# Patient Record
Sex: Female | Born: 1945 | Race: White | Hispanic: No | Marital: Married | Smoking: Never smoker
Health system: Southern US, Community
[De-identification: ages and names within clinical notes are randomized; demographics above are authoritative.]

## PROBLEM LIST (undated history)

## (undated) DIAGNOSIS — D126 Benign neoplasm of colon, unspecified: Secondary | ICD-10-CM

## (undated) DIAGNOSIS — C801 Malignant (primary) neoplasm, unspecified: Secondary | ICD-10-CM

## (undated) DIAGNOSIS — M722 Plantar fascial fibromatosis: Secondary | ICD-10-CM

## (undated) DIAGNOSIS — M8580 Other specified disorders of bone density and structure, unspecified site: Secondary | ICD-10-CM

## (undated) DIAGNOSIS — I7300 Raynaud's syndrome without gangrene: Secondary | ICD-10-CM

## (undated) DIAGNOSIS — S32010A Wedge compression fracture of first lumbar vertebra, initial encounter for closed fracture: Secondary | ICD-10-CM

## (undated) DIAGNOSIS — I7090 Unspecified atherosclerosis: Secondary | ICD-10-CM

## (undated) DIAGNOSIS — M3500 Sicca syndrome, unspecified: Secondary | ICD-10-CM

## (undated) DIAGNOSIS — K635 Polyp of colon: Secondary | ICD-10-CM

## (undated) DIAGNOSIS — M1990 Unspecified osteoarthritis, unspecified site: Secondary | ICD-10-CM

## (undated) DIAGNOSIS — M329 Systemic lupus erythematosus, unspecified: Secondary | ICD-10-CM

## (undated) DIAGNOSIS — F419 Anxiety disorder, unspecified: Secondary | ICD-10-CM

## (undated) DIAGNOSIS — T7840XA Allergy, unspecified, initial encounter: Secondary | ICD-10-CM

## (undated) DIAGNOSIS — E039 Hypothyroidism, unspecified: Secondary | ICD-10-CM

## (undated) DIAGNOSIS — Z9109 Other allergy status, other than to drugs and biological substances: Secondary | ICD-10-CM

## (undated) HISTORY — DX: Raynaud's syndrome without gangrene: I73.00

## (undated) HISTORY — DX: Plantar fascial fibromatosis: M72.2

## (undated) HISTORY — PX: CATARACT EXTRACTION: SUR2

## (undated) HISTORY — PX: ENDOMETRIAL ABLATION: SHX621

## (undated) HISTORY — DX: Sjogren syndrome, unspecified: M35.00

## (undated) HISTORY — PX: POLYPECTOMY: SHX149

## (undated) HISTORY — DX: Polyp of colon: K63.5

## (undated) HISTORY — PX: APPENDECTOMY: SHX54

## (undated) HISTORY — DX: Other specified disorders of bone density and structure, unspecified site: M85.80

## (undated) HISTORY — PX: WISDOM TOOTH EXTRACTION: SHX21

## (undated) HISTORY — DX: Hypothyroidism, unspecified: E03.9

## (undated) HISTORY — DX: Unspecified osteoarthritis, unspecified site: M19.90

## (undated) HISTORY — DX: Systemic lupus erythematosus, unspecified: M32.9

## (undated) HISTORY — DX: Unspecified atherosclerosis: I70.90

## (undated) HISTORY — DX: Other allergy status, other than to drugs and biological substances: Z91.09

## (undated) HISTORY — DX: Benign neoplasm of colon, unspecified: D12.6

## (undated) HISTORY — DX: Wedge compression fracture of first lumbar vertebra, initial encounter for closed fracture: S32.010A

---

## 1998-02-09 ENCOUNTER — Other Ambulatory Visit: Admission: RE | Admit: 1998-02-09 | Discharge: 1998-02-09 | Payer: Self-pay | Admitting: Obstetrics and Gynecology

## 1998-03-15 ENCOUNTER — Ambulatory Visit (HOSPITAL_COMMUNITY): Admission: RE | Admit: 1998-03-15 | Discharge: 1998-03-15 | Payer: Self-pay | Admitting: Obstetrics and Gynecology

## 1998-04-08 ENCOUNTER — Ambulatory Visit (HOSPITAL_COMMUNITY): Admission: RE | Admit: 1998-04-08 | Discharge: 1998-04-08 | Payer: Self-pay | Admitting: *Deleted

## 1998-10-11 ENCOUNTER — Ambulatory Visit (HOSPITAL_COMMUNITY): Admission: RE | Admit: 1998-10-11 | Discharge: 1998-10-11 | Payer: Self-pay | Admitting: Obstetrics and Gynecology

## 1999-03-13 ENCOUNTER — Other Ambulatory Visit: Admission: RE | Admit: 1999-03-13 | Discharge: 1999-03-13 | Payer: Self-pay | Admitting: Obstetrics and Gynecology

## 1999-04-04 ENCOUNTER — Ambulatory Visit (HOSPITAL_COMMUNITY): Admission: RE | Admit: 1999-04-04 | Discharge: 1999-04-04 | Payer: Self-pay | Admitting: Obstetrics and Gynecology

## 1999-04-04 ENCOUNTER — Encounter: Payer: Self-pay | Admitting: Obstetrics and Gynecology

## 2000-03-21 ENCOUNTER — Other Ambulatory Visit: Admission: RE | Admit: 2000-03-21 | Discharge: 2000-03-21 | Payer: Self-pay | Admitting: Obstetrics and Gynecology

## 2000-04-04 ENCOUNTER — Encounter: Admission: RE | Admit: 2000-04-04 | Discharge: 2000-04-04 | Payer: Self-pay | Admitting: Obstetrics and Gynecology

## 2000-04-04 ENCOUNTER — Encounter: Payer: Self-pay | Admitting: Obstetrics and Gynecology

## 2001-03-11 ENCOUNTER — Other Ambulatory Visit: Admission: RE | Admit: 2001-03-11 | Discharge: 2001-03-11 | Payer: Self-pay | Admitting: Obstetrics and Gynecology

## 2001-03-21 ENCOUNTER — Other Ambulatory Visit: Admission: RE | Admit: 2001-03-21 | Discharge: 2001-03-21 | Payer: Self-pay | Admitting: Obstetrics and Gynecology

## 2001-03-26 ENCOUNTER — Encounter: Admission: RE | Admit: 2001-03-26 | Discharge: 2001-03-26 | Payer: Self-pay | Admitting: Obstetrics and Gynecology

## 2001-03-26 ENCOUNTER — Encounter: Payer: Self-pay | Admitting: Obstetrics and Gynecology

## 2001-04-08 ENCOUNTER — Encounter: Admission: RE | Admit: 2001-04-08 | Discharge: 2001-04-08 | Payer: Self-pay | Admitting: Obstetrics and Gynecology

## 2001-04-08 ENCOUNTER — Encounter: Payer: Self-pay | Admitting: Obstetrics and Gynecology

## 2001-05-08 ENCOUNTER — Encounter: Payer: Self-pay | Admitting: Obstetrics and Gynecology

## 2001-05-08 ENCOUNTER — Encounter: Admission: RE | Admit: 2001-05-08 | Discharge: 2001-05-08 | Payer: Self-pay | Admitting: Obstetrics and Gynecology

## 2001-09-08 ENCOUNTER — Encounter (INDEPENDENT_AMBULATORY_CARE_PROVIDER_SITE_OTHER): Payer: Self-pay | Admitting: *Deleted

## 2001-09-08 ENCOUNTER — Ambulatory Visit (HOSPITAL_COMMUNITY): Admission: RE | Admit: 2001-09-08 | Discharge: 2001-09-08 | Payer: Self-pay | Admitting: Gastroenterology

## 2002-03-24 ENCOUNTER — Other Ambulatory Visit: Admission: RE | Admit: 2002-03-24 | Discharge: 2002-03-24 | Payer: Self-pay | Admitting: Obstetrics and Gynecology

## 2002-04-09 ENCOUNTER — Encounter: Admission: RE | Admit: 2002-04-09 | Discharge: 2002-04-09 | Payer: Self-pay | Admitting: Obstetrics and Gynecology

## 2002-04-09 ENCOUNTER — Encounter: Payer: Self-pay | Admitting: Obstetrics and Gynecology

## 2002-08-25 ENCOUNTER — Encounter: Admission: RE | Admit: 2002-08-25 | Discharge: 2002-08-25 | Payer: Self-pay

## 2002-09-24 ENCOUNTER — Encounter: Payer: Self-pay | Admitting: Orthopedic Surgery

## 2002-09-24 ENCOUNTER — Ambulatory Visit (HOSPITAL_COMMUNITY): Admission: RE | Admit: 2002-09-24 | Discharge: 2002-09-24 | Payer: Self-pay | Admitting: Orthopedic Surgery

## 2002-10-26 ENCOUNTER — Encounter: Payer: Self-pay | Admitting: Orthopedic Surgery

## 2002-10-26 ENCOUNTER — Encounter: Admission: RE | Admit: 2002-10-26 | Discharge: 2002-10-26 | Payer: Self-pay | Admitting: Orthopedic Surgery

## 2002-11-10 ENCOUNTER — Other Ambulatory Visit: Admission: RE | Admit: 2002-11-10 | Discharge: 2002-11-10 | Payer: Self-pay | Admitting: Obstetrics & Gynecology

## 2003-03-29 ENCOUNTER — Other Ambulatory Visit: Admission: RE | Admit: 2003-03-29 | Discharge: 2003-03-29 | Payer: Self-pay | Admitting: Obstetrics and Gynecology

## 2003-04-01 ENCOUNTER — Encounter: Admission: RE | Admit: 2003-04-01 | Discharge: 2003-04-01 | Payer: Self-pay | Admitting: Obstetrics and Gynecology

## 2003-04-01 ENCOUNTER — Encounter: Payer: Self-pay | Admitting: Obstetrics and Gynecology

## 2003-10-18 ENCOUNTER — Encounter: Admission: RE | Admit: 2003-10-18 | Discharge: 2003-10-18 | Payer: Self-pay | Admitting: Family Medicine

## 2004-04-17 ENCOUNTER — Encounter: Admission: RE | Admit: 2004-04-17 | Discharge: 2004-04-17 | Payer: Self-pay | Admitting: Family Medicine

## 2004-04-17 ENCOUNTER — Encounter: Admission: RE | Admit: 2004-04-17 | Discharge: 2004-04-17 | Payer: Self-pay | Admitting: Obstetrics and Gynecology

## 2004-10-10 ENCOUNTER — Encounter: Admission: RE | Admit: 2004-10-10 | Discharge: 2004-10-10 | Payer: Self-pay | Admitting: Family Medicine

## 2005-04-18 ENCOUNTER — Encounter: Admission: RE | Admit: 2005-04-18 | Discharge: 2005-04-18 | Payer: Self-pay | Admitting: Obstetrics and Gynecology

## 2006-04-22 ENCOUNTER — Encounter: Admission: RE | Admit: 2006-04-22 | Discharge: 2006-04-22 | Payer: Self-pay | Admitting: Obstetrics and Gynecology

## 2006-10-10 ENCOUNTER — Encounter: Admission: RE | Admit: 2006-10-10 | Discharge: 2006-10-10 | Payer: Self-pay | Admitting: Family Medicine

## 2006-10-22 HISTORY — PX: BUNIONECTOMY: SHX129

## 2007-05-01 ENCOUNTER — Encounter: Admission: RE | Admit: 2007-05-01 | Discharge: 2007-05-01 | Payer: Self-pay | Admitting: Obstetrics and Gynecology

## 2008-05-03 ENCOUNTER — Encounter: Admission: RE | Admit: 2008-05-03 | Discharge: 2008-05-03 | Payer: Self-pay | Admitting: Obstetrics and Gynecology

## 2008-07-28 ENCOUNTER — Encounter: Admission: RE | Admit: 2008-07-28 | Discharge: 2008-07-28 | Payer: Self-pay | Admitting: Obstetrics and Gynecology

## 2008-11-08 ENCOUNTER — Encounter: Admission: RE | Admit: 2008-11-08 | Discharge: 2008-11-08 | Payer: Self-pay | Admitting: Family Medicine

## 2009-01-21 ENCOUNTER — Encounter: Admission: RE | Admit: 2009-01-21 | Discharge: 2009-01-21 | Payer: Self-pay | Admitting: Obstetrics and Gynecology

## 2009-05-04 ENCOUNTER — Encounter: Admission: RE | Admit: 2009-05-04 | Discharge: 2009-05-04 | Payer: Self-pay | Admitting: Obstetrics and Gynecology

## 2009-07-12 ENCOUNTER — Ambulatory Visit (HOSPITAL_COMMUNITY): Admission: RE | Admit: 2009-07-12 | Discharge: 2009-07-12 | Payer: Self-pay | Admitting: Family Medicine

## 2009-07-12 ENCOUNTER — Encounter (INDEPENDENT_AMBULATORY_CARE_PROVIDER_SITE_OTHER): Payer: Self-pay | Admitting: *Deleted

## 2009-07-19 ENCOUNTER — Ambulatory Visit: Payer: Self-pay | Admitting: Gastroenterology

## 2009-07-19 DIAGNOSIS — R1011 Right upper quadrant pain: Secondary | ICD-10-CM | POA: Insufficient documentation

## 2009-07-20 ENCOUNTER — Telehealth (INDEPENDENT_AMBULATORY_CARE_PROVIDER_SITE_OTHER): Payer: Self-pay | Admitting: *Deleted

## 2009-07-20 DIAGNOSIS — K828 Other specified diseases of gallbladder: Secondary | ICD-10-CM | POA: Insufficient documentation

## 2010-05-08 ENCOUNTER — Encounter: Admission: RE | Admit: 2010-05-08 | Discharge: 2010-05-08 | Payer: Self-pay | Admitting: Family Medicine

## 2010-11-13 ENCOUNTER — Encounter: Payer: Self-pay | Admitting: Family Medicine

## 2010-12-04 ENCOUNTER — Other Ambulatory Visit: Payer: Self-pay | Admitting: Family Medicine

## 2010-12-04 DIAGNOSIS — Z780 Asymptomatic menopausal state: Secondary | ICD-10-CM

## 2010-12-14 ENCOUNTER — Ambulatory Visit
Admission: RE | Admit: 2010-12-14 | Discharge: 2010-12-14 | Disposition: A | Discharge status: Home / Self Care | Payer: BC Managed Care – PPO | Source: Ambulatory Visit | Attending: Family Medicine | Admitting: Family Medicine

## 2010-12-14 DIAGNOSIS — Z780 Asymptomatic menopausal state: Secondary | ICD-10-CM

## 2011-03-09 NOTE — Procedures (Signed)
[facility]  Patient:    CARROLL, RANNEY Visit Number: [id number] MRN: [id number]          Service Type: Attending:  Verlin Grills, M.D. Dictated by:   Verlin Grills, M.D. Proc. Date: 09/08/01   CC:         Heather Roberts, M.D.   Procedure Report  PROCEDURE PERFORMED:  Screening colonoscopy  REFERRING PHYSICIAN:  Heather Roberts, M.D.  INDICATION FOR PROCEDURE:  Mrs. Jameela Michna (date of birth is 18-Sep-1946) is a 65 year old female who is due for her first screening colonoscopy with polypectomy to prevent colon cancer.  She has an uncle who was diagnosed with colon cancer.  I discussed with Mrs. Choung, the complications associated with colonoscopy and polypectomy including a 15:1000 risk of bleeding and 4:1000 risk of colonic perforation requiring surgical repair.  Mrs. Tamargo has signed the operative permit.  ENDOSCOPIST:  Verlin Grills, M.D.  PREMEDICATION:  Versed 10 mg, Demerol 50 mg.  ENDOSCOPE: Olympus pediatric colonoscope.  DESCRIPTION OF PROCEDURE:  After obtaining informed consent, Mrs. Fodera was placed in the left lateral decubitus position. I administered intravenous Versed and intravenous Demerol to achieve conscious sedation for the procedure.  The patients blood pressure, oxygen saturation and cardiac rhythm were monitored throughout the procedure and documented in the medical record.  Anal inspection was normal.  Digital rectal exam was normal.  The Olympus pediatric video colonoscope was introduced into the rectum and easily advanced to the cecum.  Colonic preparation for the exam today was excellent.  Rectum:  Normal.  Sigmoid colon and descending colon:  Normal.  Splenic flexure:  Normal.  Transverse colon:  Normal.  Hepatic flexure:  Normal.  Ascending colon:  Normal.  Cecum and ileocecal valve:  Normal.  ASSESSMENT:  Normal proctocolonoscopy to the cecum.  No endoscopic  evidence for the present of colorectal neoplasia.  RECOMMENDATIONS:  Repeat screening colonic exam in ten years.Dictated by: Verlin Grills, M.D. Attending:  Verlin Grills, M.D. DD:  09/08/01 TD:  09/08/01 Job: [id number]

## 2011-04-06 ENCOUNTER — Other Ambulatory Visit: Payer: Self-pay | Admitting: Family Medicine

## 2011-04-06 DIAGNOSIS — Z1231 Encounter for screening mammogram for malignant neoplasm of breast: Secondary | ICD-10-CM

## 2011-04-19 ENCOUNTER — Encounter: Payer: Self-pay | Admitting: Gastroenterology

## 2011-05-10 ENCOUNTER — Ambulatory Visit
Admission: RE | Admit: 2011-05-10 | Discharge: 2011-05-10 | Disposition: A | Discharge status: Home / Self Care | Payer: BC Managed Care – PPO | Source: Ambulatory Visit | Attending: Family Medicine | Admitting: Family Medicine

## 2011-05-10 DIAGNOSIS — Z1231 Encounter for screening mammogram for malignant neoplasm of breast: Secondary | ICD-10-CM

## 2011-06-13 ENCOUNTER — Other Ambulatory Visit: Payer: BC Managed Care – PPO | Admitting: Gastroenterology

## 2011-07-27 ENCOUNTER — Encounter: Payer: Self-pay | Admitting: Gastroenterology

## 2011-08-13 ENCOUNTER — Ambulatory Visit (AMBULATORY_SURGERY_CENTER): Payer: Medicare Other

## 2011-08-13 VITALS — Ht 67.0 in | Wt 152.0 lb

## 2011-08-13 DIAGNOSIS — Z1211 Encounter for screening for malignant neoplasm of colon: Secondary | ICD-10-CM

## 2011-08-13 MED ORDER — PEG-KCL-NACL-NASULF-NA ASC-C 100 G PO SOLR: 1.0000 | Freq: Once | ORAL | Status: DC

## 2011-08-27 ENCOUNTER — Telehealth: Payer: Self-pay

## 2011-08-27 ENCOUNTER — Encounter: Payer: Self-pay | Admitting: Gastroenterology

## 2011-08-27 ENCOUNTER — Ambulatory Visit (AMBULATORY_SURGERY_CENTER): Payer: Medicare Other | Admitting: Gastroenterology

## 2011-08-27 VITALS — BP 141/63 | HR 70 | Temp 96.1°F | Resp 33 | Ht 67.0 in | Wt 152.0 lb

## 2011-08-27 DIAGNOSIS — Z1211 Encounter for screening for malignant neoplasm of colon: Secondary | ICD-10-CM

## 2011-08-27 DIAGNOSIS — D126 Benign neoplasm of colon, unspecified: Secondary | ICD-10-CM

## 2011-08-27 DIAGNOSIS — R933 Abnormal findings on diagnostic imaging of other parts of digestive tract: Secondary | ICD-10-CM

## 2011-08-27 HISTORY — PX: COLONOSCOPY W/ POLYPECTOMY: SHX1380

## 2011-08-27 MED ORDER — SODIUM CHLORIDE 0.9 % IV SOLN: 500.0000 mL | INTRAVENOUS | Status: DC

## 2011-08-27 NOTE — Telephone Encounter (Signed)
Records sent to [facility]  CT scheduled   Please have labs done on 08/30/11 at our [place] basement lab.   You have been scheduled for a CT scan of the abdomen and pelvis at [facility] ([address]---this is in the same building as Architectural technologist).   You are scheduled on 08/31/11 at 11 am . You should arrive 15 minutes prior to your appointment time for registration. Please follow the written instructions below on the day of your exam:  WARNING: IF YOU ARE ALLERGIC TO IODINE/X-RAY DYE, PLEASE NOTIFY RADIOLOGY IMMEDIATELY AT [phone]! YOU WILL BE GIVEN A 13 HOUR PREMEDICATION PREP.  1) Do not eat or drink anything after 7 am (4 hours prior to your test) 2) You have been given 2 bottles of oral contrast to drink. The solution may taste  better if refrigerated, but do NOT add ice or any other liquid to this solution. Shake well before drinking.    Drink 1 bottle of contrast @ 9 am (2 hours prior to your exam)  Drink 1 bottle of contrast @ 10 am  (1 hour prior to your exam)  You may take any medications as prescribed with a small amount of water except for the following: Metformin, Glucophage, Glucovance, Avandamet, Riomet, Fortamet, Actoplus Met, Janumet, Glumetza or Metaglip. The above medications must be held the day of the exam AND 48 hours after the exam.  The purpose of you drinking the oral contrast is to aid in the visualization of your intestinal tract. The contrast solution may cause some diarrhea. Before your exam is started, you will be given a small amount of fluid to drink. Depending on your individual set of symptoms, you may also receive an intravenous injection of x-ray contrast/dye. Plan on being at [facility] for 30 minutes or long, depending on the type of exam you are having performed.  If you have any questions regarding your exam or if you need to reschedule, you may call the CT department at [phone] between the hours of 8:00 am and 5:00 pm,  Monday-Friday.  ________________________________________________________________________    Records sent to [facility]  CT scheduled   Please have labs done on 08/30/11 at our [place] basement lab.   You have been scheduled for a CT scan of the abdomen and pelvis at [facility] ([address]---this is in the same building as Architectural technologist).   You are scheduled on 08/31/11 at 11 am . You should arrive 15 minutes prior to your appointment time for registration. Please follow the written instructions below on the day of your exam:  WARNING: IF YOU ARE ALLERGIC TO IODINE/X-RAY DYE, PLEASE NOTIFY RADIOLOGY IMMEDIATELY AT [phone]! YOU WILL BE GIVEN A 13 HOUR PREMEDICATION PREP.  1) Do not eat or drink anything after 7 am (4 hours prior to your test) 2) You have been given 2 bottles of oral contrast to drink. The solution may taste  better if refrigerated, but do NOT add ice or any other liquid to this solution. Shake well before drinking.    Drink 1 bottle of contrast @ 9 am (2 hours prior to your exam)  Drink 1 bottle of contrast @ 10 am  (1 hour prior to your exam)  You may take any medications as prescribed with a small amount of water except for the following: Metformin, Glucophage, Glucovance, Avandamet, Riomet, Fortamet, Actoplus Met, Janumet, Glumetza or Metaglip. The above medications must be held the day of the exam AND  48 hours after the exam.  The purpose of you drinking the oral contrast is to aid in the visualization of your intestinal tract. The contrast solution may cause some diarrhea. Before your exam is started, you will be given a small amount of fluid to drink. Depending on your individual set of symptoms, you may also receive an intravenous injection of x-ray contrast/dye. Plan on being at [facility] for 30 minutes or long, depending on the type of exam you are having performed.  If you have any questions regarding your exam or if you need to  reschedule, you may call the CT department at [phone] between the hours of 8:00 am and 5:00 pm, Monday-Friday.  ________________________________________________________________________  Call pt on 08/28/11 Had procedure today

## 2011-08-27 NOTE — Progress Notes (Signed)
Pt tolerated the colonoscopy very well.  No facial grimacing noted. Maw  Pt waking up as Dr. Christella Hartigan was removing the scope.  I asked the pt if she was comfortable, she replied "yes".  Per Dr. Christella Hartigan no further sedation orders. maw

## 2011-08-27 NOTE — Patient Instructions (Signed)
Green and blue discharge instructions reviewed with patient and care partner.  Impressions/recommendations:  Polyp (handout given)  Dr. Christella Hartigan office to arrange for CT Scan and referral to general surgeon for appendectomy and possible resection of colon.  Resume medications as you were taking them prior to your procedure.

## 2011-08-28 ENCOUNTER — Telehealth: Payer: Self-pay | Admitting: *Deleted

## 2011-08-28 NOTE — Telephone Encounter (Signed)

## 2011-08-28 NOTE — Telephone Encounter (Signed)
Pt aware of the instructions and will have labs and procedure

## 2011-08-29 ENCOUNTER — Other Ambulatory Visit (INDEPENDENT_AMBULATORY_CARE_PROVIDER_SITE_OTHER): Payer: Medicare Other

## 2011-08-29 DIAGNOSIS — R933 Abnormal findings on diagnostic imaging of other parts of digestive tract: Secondary | ICD-10-CM

## 2011-08-30 ENCOUNTER — Other Ambulatory Visit (HOSPITAL_COMMUNITY)
Admission: RE | Admit: 2011-08-30 | Discharge: 2011-08-30 | Disposition: A | Discharge status: Home / Self Care | Payer: Medicare Other | Source: Ambulatory Visit | Attending: Family Medicine | Admitting: Family Medicine

## 2011-08-30 ENCOUNTER — Other Ambulatory Visit: Payer: Self-pay | Admitting: Family Medicine

## 2011-08-30 DIAGNOSIS — Z124 Encounter for screening for malignant neoplasm of cervix: Secondary | ICD-10-CM | POA: Insufficient documentation

## 2011-08-31 ENCOUNTER — Ambulatory Visit (INDEPENDENT_AMBULATORY_CARE_PROVIDER_SITE_OTHER)
Admission: RE | Admit: 2011-08-31 | Discharge: 2011-08-31 | Disposition: A | Discharge status: Home / Self Care | Payer: Medicare Other | Source: Ambulatory Visit | Attending: Gastroenterology | Admitting: Gastroenterology

## 2011-08-31 DIAGNOSIS — R933 Abnormal findings on diagnostic imaging of other parts of digestive tract: Secondary | ICD-10-CM

## 2011-08-31 MED ORDER — IOHEXOL 300 MG/ML  SOLN
100.0000 mL | Freq: Once | INTRAMUSCULAR | Status: AC | PRN
  Administered 2011-08-31: 100 mL via INTRAVENOUS

## 2011-09-10 ENCOUNTER — Ambulatory Visit (INDEPENDENT_AMBULATORY_CARE_PROVIDER_SITE_OTHER): Payer: Medicare Other | Admitting: Surgery

## 2011-09-10 ENCOUNTER — Encounter (INDEPENDENT_AMBULATORY_CARE_PROVIDER_SITE_OTHER): Payer: Self-pay | Admitting: Surgery

## 2011-09-10 DIAGNOSIS — D126 Benign neoplasm of colon, unspecified: Secondary | ICD-10-CM

## 2011-09-10 HISTORY — DX: Benign neoplasm of colon, unspecified: D12.6

## 2011-09-10 NOTE — Progress Notes (Addendum)
Subjective:     Patient ID: [name], female   DOB: 10/12/1946, 65 y.o.   MRN: [id number]  HPI  Patient Care Team: Gweneth Dimitri, MD as PCP - General (Family Medicine) Rob Bunting, MD (Gastroenterology)  This patient is a 65 y.o.female who presents today for surgical evaluation.   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 Need for resection.  Patient is a pleasant woman with known history of colorectal problems in the past. She had a normal colonoscopy 10 years ago. She was given a screening followup. Dr. Christella Hartigan noted a polypoid mass coming out of the appendiceal orifice. Biopsies showed serrated adenoma. He sent to the patient to [place] for resection.  Patient normally has a bowel movement everyday. She can walk on a treadmill for 30 minutes without difficulty. No bleeding. No history of other GI problems. She comes today with her husband.  She saw [place] in 2010 for abdominal pains. She had a HIDA scan that showed a decreased gallbladder ejection fraction. However, when she saw [facility], she was asymptomatic. My partner, Dr. Dwain Sarna, did not feel that she required cholecystectomy at that time. The patient is still asymptomatic. She is eating fine now with no episodes of biliary colic. No bloating, nausea, vomiting, bleeding.  Past Medical History  Diagnosis Date  . Systemic lupus   . Raynauds phenomenon   . Sjogren's syndrome   . Osteoarthritis   . Hypothyroid   . Environmental allergies   . Osteoarthritis   . Osteoarthritis   . Osteopenia   . Colon polyp     Past Surgical History  Procedure Date  . Bunionectomy 2008    right foot  . Endometrial ablation   . Colonoscopy w/ polypectomy Aug 27, 2011    History   Social History  . Marital Status: Married    Spouse Name: N/A    Number of Children: N/A  . Years of Education: N/A   Occupational History  . Not on file.   Social History Main Topics  . Smoking status: Never Smoker   . Smokeless tobacco: Not  on file  . Alcohol Use: Yes  . Drug Use: No  . Sexually Active: Not on file   Other Topics Concern  . Not on file   Social History Narrative  . No narrative on file    Family History  Problem Relation Age of Onset  . Heart disease Mother   . Heart disease Brother   . Diabetes Maternal Grandfather   . Cancer Maternal Uncle     colon    Current outpatient prescriptions:aspirin 81 MG tablet, Take 81 mg by mouth daily.  , Disp: , Rfl: ;  calcium carbonate (OS-CAL) 600 MG TABS, Take 600 mg by mouth 3 (three) times daily.  , Disp: , Rfl: ;  cholecalciferol (VITAMIN D) 1000 UNITS tablet, Take 1,000 Units by mouth 2 (two) times daily.  , Disp: , Rfl: ;  Coenzyme Q10 (CO Q 10 PO), Take 200 mg by mouth 2 times daily at 12 noon and 4 pm.  , Disp: , Rfl:  desonide (DESOWEN) 0.05 % ointment, , Disp: , Rfl: ;  escitalopram (LEXAPRO) 10 MG tablet, , Disp: , Rfl: ;  fexofenadine (ALLEGRA) 180 MG tablet, Take 180 mg by mouth daily.  , Disp: , Rfl: ;  fish oil-omega-3 fatty acids 1000 MG capsule, Take 2 g by mouth daily.  , Disp: , Rfl: ;  folic acid (FOLVITE) 1 MG tablet, , Disp: ,  Rfl:  Glucosamine-Chondroit-Vit C-Mn (GLUCOSAMINE CHONDR 1500 COMPLX PO), Take 1,500 mg by mouth 2 (two) times daily at 10 AM and 5 PM.  , Disp: , Rfl: ;  hydroxychloroquine (PLAQUENIL) 200 MG tablet, , Disp: , Rfl: ;  Magnesium Malate POWD, 1,000 mg by Does not apply route 3 (three) times daily. This is actually a pill , Disp: , Rfl: ;  Manganese 50 MG TABS, Take 50 mg by mouth daily.  , Disp: , Rfl: ;  meloxicam (MOBIC) 15 MG tablet, , Disp: , Rfl:  Multiple Vitamin (MULTIVITAMIN) tablet, Take 1 tablet by mouth daily.  , Disp: , Rfl: ;  SYNTHROID 75 MCG tablet, , Disp: , Rfl: ;  Thiamine HCl (VITAMIN B-1) 100 MG tablet, Take 100 mg by mouth daily.  , Disp: , Rfl: ;  zolpidem (AMBIEN) 10 MG tablet, , Disp: , Rfl:   Allergies  Allergen Reactions  . Sulfonamide Derivatives     REACTION: headache       Review of Systems    Constitutional: Negative for fever, chills, diaphoresis, appetite change and fatigue.  HENT: Negative for ear pain, sore throat, trouble swallowing, neck pain and ear discharge.   Eyes: Negative for photophobia, discharge and visual disturbance.  Respiratory: Negative for cough, choking, chest tightness and shortness of breath.   Cardiovascular: Negative for chest pain and palpitations.  Gastrointestinal: Negative for nausea, vomiting, abdominal pain, diarrhea, constipation, blood in stool, abdominal distention, anal bleeding and rectal pain.  Genitourinary: Negative for dysuria, frequency and difficulty urinating.  Musculoskeletal: Positive for arthralgias. Negative for myalgias, back pain and gait problem.  Skin: Negative for color change, pallor and rash.  Neurological: Negative for dizziness, speech difficulty, weakness and numbness.  Hematological: Negative for adenopathy.  Psychiatric/Behavioral: Negative for confusion and agitation. The patient is not nervous/anxious.        Objective:   Physical Exam  Constitutional: She is oriented to person, place, and time. She appears well-developed and well-nourished. No distress.  HENT:  Head: Normocephalic.  Mouth/Throat: Oropharynx is clear and moist. No oropharyngeal exudate.  Eyes: Conjunctivae and EOM are normal. Pupils are equal, round, and reactive to light. No scleral icterus.  Neck: Normal range of motion. Neck supple. No tracheal deviation present.  Cardiovascular: Normal rate, regular rhythm and intact distal pulses.   Pulmonary/Chest: Effort normal and breath sounds normal. No respiratory distress. She exhibits no tenderness.  Abdominal: Soft. She exhibits no distension and no mass. There is no tenderness. There is no rebound and no guarding. Hernia confirmed negative in the right inguinal area and confirmed negative in the left inguinal area.  Genitourinary: No vaginal discharge found.  Musculoskeletal: Normal range of motion.  She exhibits no tenderness.  Lymphadenopathy:    She has no cervical adenopathy.       Right: No inguinal adenopathy present.       Left: No inguinal adenopathy present.  Neurological: She is alert and oriented to person, place, and time. No cranial nerve deficit. She exhibits normal muscle tone. Coordination normal.  Skin: Skin is warm and dry. No rash noted. She is not diaphoretic. No erythema.  Psychiatric: She has a normal mood and affect. Her behavior is normal. Judgment and thought content normal.   Studies:  Colonoscopy revealing any polypoid mass at the appendiceal orifice. Biopsy shows a serrated adenoma.  CT scan shows no abnormalities. No mucocele. No carcinomatosis. No metastatic disease.    Assessment:     Serrated adenoma of appendiceal base  Plan:     The anatomy & physiology of the digestive tract was discussed.  The pathophysiology of polyps was discussed.  Natural history risks of carcinogenesis without surgery was discussed.   I feel the risks of no intervention will lead to serious problems that outweigh the operative risks; therefore, I recommended diagnostic laparoscopy with removal of appendix to remove the pathology.   Plan a wedge of cecum for good margins as well.  Laparoscopic & possible open techniques were discussed.   I noted a good likelihood this will help address the problem.    Risks such as bleeding, infection, abscess, leak, reoperation, possible ostomy, hernia, heart attack, death, and other risks were discussed.  Goals of post-operative recovery were discussed as well.  We will work to minimize complications.  Questions were answered.  The patient expresses understanding & wishes to proceed with surgery.

## 2011-09-10 NOTE — Patient Instructions (Signed)
Laparoscopic Appendectomy Appendectomy is surgery to remove the appendix. Laparoscopic surgery uses several small cuts (incisions) instead of one large incision. Laparoscopic surgery offers a shorter recovery time and less discomfort. LET YOUR CAREGIVER KNOW ABOUT:  Allergies to food or medicine.   Medicines taken, including vitamins, dietary supplements, herbs, eyedrops, over-the-counter medicines, and creams.   Use of steroids (by mouth or creams).   Previous problems with anesthetics or numbing medicines.   History of bleeding problems or blood clots.   Previous surgery.   Other health problems, including diabetes, heart problems, lung problems, and kidney problems.   Possibility of pregnancy, if this applies.  RISKS AND COMPLICATIONS  Infection. A germ starts growing in the wound. This can usually be treated with antibiotics. In some cases, the wound will need to be opened and cleaned.   Bleeding.   Damage to other organs.   Sores (abscesses).   Chronic pain at the incision sites. This is defined as pain that lasts for more than 3 months.   Blood clots in the legs that may rarely travel to the lungs.   Infection in the lungs (pneumonia).  BEFORE THE PROCEDURE Appendectomy is usually performed immediately after an inflamed appendix (appendicitis) is diagnosed. No preparation is necessary ahead of this procedure. PROCEDURE  You will be given medicine that makes you sleep (general anesthetic). After you are asleep, a flexible tube (catheter) may be inserted into your bladder to drain your urine during surgery. The tube is removed before you wake up after surgery. When you are asleep, carbondioxide gas will be used to inflate your abdomen. This will allow your surgeon to see inside your abdomen and perform your surgery. Three small incisions will be made in your abdomen. Your surgeon will insert a thin, lighted tube (laparoscope) through one of the incisions. Your surgeon will  look through the laparoscope while performing the surgery. Other tools will be inserted through the other incisions. Laparoscopic procedures may not be appropriate when:  There is major scarring from a previous surgery.   The patient has bleeding disorders.   A pregnancy is near term.   There are other conditions which make the laparoscopic procedure impossible, such as an advanced infection or a ruptured appendix.  If your surgeon feels it is not safe to continue with the laparoscopic procedure, he or she will perform an open surgery instead. This gives the surgeon a larger view and more space to work. Open surgery requires a longer recovery time. After your appendix is removed, your incisions will be closed with stitches (sutures) or skin adhesive. AFTER THE PROCEDURE You will be taken to a recovery room. When the anesthesia has worn off, you will be returned to your hospital room. You will be given pain medicines to keep you comfortable. Ask your caregiver how long your hospital stay will be. Document Released: 05/22/2004 Document Revised: 06/20/2011 Document Reviewed: 04/17/2011 [employer] Patient Information 2012 [place], [place].

## 2011-10-02 ENCOUNTER — Other Ambulatory Visit (INDEPENDENT_AMBULATORY_CARE_PROVIDER_SITE_OTHER): Payer: Self-pay | Admitting: Surgery

## 2011-10-02 ENCOUNTER — Telehealth (INDEPENDENT_AMBULATORY_CARE_PROVIDER_SITE_OTHER): Payer: Self-pay

## 2011-10-02 ENCOUNTER — Encounter (HOSPITAL_COMMUNITY): Payer: Self-pay | Admitting: Pharmacy Technician

## 2011-10-02 NOTE — Telephone Encounter (Signed)
[name] at [facility] short stay requests orders be put in on [name]. Surgery is 12-20.

## 2011-10-03 ENCOUNTER — Encounter (HOSPITAL_COMMUNITY)
Admission: RE | Admit: 2011-10-03 | Discharge: 2011-10-03 | Disposition: A | Discharge status: Home / Self Care | Payer: Medicare Other | Source: Ambulatory Visit | Attending: Surgery | Admitting: Surgery

## 2011-10-03 ENCOUNTER — Encounter (HOSPITAL_COMMUNITY): Payer: Self-pay

## 2011-10-03 LAB — CBC
HCT: 37.1 % (ref 36.0–46.0)
Hemoglobin: 12.4 g/dL (ref 12.0–15.0)
MCH: 32.5 pg (ref 26.0–34.0)
MCHC: 33.4 g/dL (ref 30.0–36.0)
MCV: 97.1 fL (ref 78.0–100.0)
RDW: 12.7 % (ref 11.5–15.5)

## 2011-10-03 NOTE — Patient Instructions (Addendum)
20 [name]  10/03/2011   Your procedure is scheduled on:  10/11/2011 [id number] am   Report to [facility] at 0820 AM.  Call this number if you have problems the morning of surgery: [phone]   Remember:   Do not eat food:After Midnight.  May have clear liquids:until Midnight .  Clear liquids include soda, tea, black coffee, apple or grape juice, broth.  Take these medicines the morning of surgery with A SIP OF WATER:    Do not wear jewelry, make-up or nail polish.  Do not wear lotions, powders, or perfumes.   Do not shave 48 hours prior to surgery.  Do not bring valuables to the hospital.  Contacts, dentures or bridgework may not be worn into surgery.  Leave suitcase in the car. After surgery it may be brought to your room.   Patients discharged the day of surgery will not be allowed to drive home.  Name and phone number of your driver:   Special Instructions: CHG Shower Use Special Wash: 1/2 bottle night before surgery and 1/2 bottle morning of surgery. shower chin to toes with CHG.  Wash face and private parts with regular soap.    Please read over the following fact sheets that you were given: MRSA Information, coughing and deep breathing exercises, leg exercises

## 2011-10-08 ENCOUNTER — Telehealth (INDEPENDENT_AMBULATORY_CARE_PROVIDER_SITE_OTHER): Payer: Self-pay

## 2011-10-08 ENCOUNTER — Other Ambulatory Visit (INDEPENDENT_AMBULATORY_CARE_PROVIDER_SITE_OTHER): Payer: Self-pay | Admitting: Surgery

## 2011-10-08 NOTE — Telephone Encounter (Signed)
Returned pt's voicemail and LMOM notifying her that I did speak with Dr Michaell Cowing about the positive nose swab. The pt was asking about having the swab test repeated at the day of surgery and per Dr Michaell Cowing they should repeat the swab test the day of sx with no new order./ [facility]

## 2011-10-09 NOTE — Pre-Procedure Instructions (Signed)
LOV note 08/21/11 from Dr Corliss Skains who handles autoimmune disease and osteoarthritis placed on chart.

## 2011-10-11 ENCOUNTER — Encounter (HOSPITAL_COMMUNITY)
Admission: RE | Disposition: A | Discharge status: Home / Self Care | Payer: Self-pay | Source: Ambulatory Visit | Attending: Surgery

## 2011-10-11 ENCOUNTER — Ambulatory Visit (HOSPITAL_COMMUNITY): Payer: Medicare Other | Admitting: Anesthesiology

## 2011-10-11 ENCOUNTER — Ambulatory Visit (HOSPITAL_COMMUNITY)
Admission: RE | Admit: 2011-10-11 | Discharge: 2011-10-11 | Disposition: A | Discharge status: Home / Self Care | Payer: Medicare Other | Source: Ambulatory Visit | Attending: Surgery | Admitting: Surgery

## 2011-10-11 ENCOUNTER — Encounter (HOSPITAL_COMMUNITY): Payer: Self-pay | Admitting: Anesthesiology

## 2011-10-11 ENCOUNTER — Other Ambulatory Visit (INDEPENDENT_AMBULATORY_CARE_PROVIDER_SITE_OTHER): Payer: Self-pay | Admitting: Surgery

## 2011-10-11 ENCOUNTER — Encounter (HOSPITAL_COMMUNITY): Payer: Self-pay | Admitting: *Deleted

## 2011-10-11 DIAGNOSIS — M899 Disorder of bone, unspecified: Secondary | ICD-10-CM | POA: Insufficient documentation

## 2011-10-11 DIAGNOSIS — E039 Hypothyroidism, unspecified: Secondary | ICD-10-CM | POA: Insufficient documentation

## 2011-10-11 DIAGNOSIS — M329 Systemic lupus erythematosus, unspecified: Secondary | ICD-10-CM | POA: Insufficient documentation

## 2011-10-11 DIAGNOSIS — Z7982 Long term (current) use of aspirin: Secondary | ICD-10-CM | POA: Insufficient documentation

## 2011-10-11 DIAGNOSIS — I658 Occlusion and stenosis of other precerebral arteries: Secondary | ICD-10-CM | POA: Insufficient documentation

## 2011-10-11 DIAGNOSIS — D126 Benign neoplasm of colon, unspecified: Secondary | ICD-10-CM | POA: Insufficient documentation

## 2011-10-11 DIAGNOSIS — D259 Leiomyoma of uterus, unspecified: Secondary | ICD-10-CM | POA: Insufficient documentation

## 2011-10-11 DIAGNOSIS — K639 Disease of intestine, unspecified: Secondary | ICD-10-CM | POA: Insufficient documentation

## 2011-10-11 DIAGNOSIS — M3500 Sicca syndrome, unspecified: Secondary | ICD-10-CM | POA: Insufficient documentation

## 2011-10-11 DIAGNOSIS — Z79899 Other long term (current) drug therapy: Secondary | ICD-10-CM | POA: Insufficient documentation

## 2011-10-11 DIAGNOSIS — M949 Disorder of cartilage, unspecified: Secondary | ICD-10-CM | POA: Insufficient documentation

## 2011-10-11 DIAGNOSIS — K562 Volvulus: Secondary | ICD-10-CM | POA: Insufficient documentation

## 2011-10-11 HISTORY — PX: LAPAROSCOPIC APPENDECTOMY: SHX408

## 2011-10-11 SURGERY — APPENDECTOMY, LAPAROSCOPIC
Anesthesia: General | Site: Abdomen | Wound class: Contaminated

## 2011-10-11 MED ORDER — NEOSTIGMINE METHYLSULFATE 1 MG/ML IJ SOLN
INTRAMUSCULAR | Status: DC | PRN
  Administered 2011-10-11: 3 mg via INTRAVENOUS

## 2011-10-11 MED ORDER — SODIUM CHLORIDE 0.9 % IJ SOLN: 3.0000 mL | Freq: Two times a day (BID) | INTRAMUSCULAR | Status: DC

## 2011-10-11 MED ORDER — STERILE WATER FOR IRRIGATION IR SOLN
Status: DC | PRN
  Administered 2011-10-11: 1500 mL

## 2011-10-11 MED ORDER — ACETAMINOPHEN 325 MG PO TABS: 650.0000 mg | ORAL_TABLET | ORAL | Status: DC | PRN

## 2011-10-11 MED ORDER — SODIUM CHLORIDE 0.9 % IV SOLN
1.0000 g | INTRAVENOUS | Status: DC | PRN
  Administered 2011-10-11: 1 g via INTRAVENOUS

## 2011-10-11 MED ORDER — OXYCODONE HCL 5 MG PO TABS: 5.0000 mg | ORAL_TABLET | ORAL | Status: DC | PRN

## 2011-10-11 MED ORDER — ONDANSETRON HCL 4 MG/2ML IJ SOLN: 4.0000 mg | Freq: Four times a day (QID) | INTRAMUSCULAR | Status: DC | PRN

## 2011-10-11 MED ORDER — FENTANYL CITRATE 0.05 MG/ML IJ SOLN: 25.0000 ug | INTRAMUSCULAR | Status: DC | PRN

## 2011-10-11 MED ORDER — SODIUM CHLORIDE 0.9 % IV SOLN
INTRAVENOUS | Status: AC
  Filled 2011-10-11: qty 50

## 2011-10-11 MED ORDER — LACTATED RINGERS IR SOLN
Status: DC | PRN
  Administered 2011-10-11: 1000 mL

## 2011-10-11 MED ORDER — SODIUM CHLORIDE 0.9 % IV SOLN: 1.0000 g | INTRAVENOUS | Status: DC

## 2011-10-11 MED ORDER — BUPIVACAINE-EPINEPHRINE 0.25% -1:200000 IJ SOLN
INTRAMUSCULAR | Status: DC | PRN
  Administered 2011-10-11: 50 mL

## 2011-10-11 MED ORDER — LACTATED RINGERS IV SOLN
INTRAVENOUS | Status: DC
  Administered 2011-10-11: 1000 mL via INTRAVENOUS

## 2011-10-11 MED ORDER — ACETAMINOPHEN 10 MG/ML IV SOLN
INTRAVENOUS | Status: DC | PRN
  Administered 2011-10-11: 1000 mg via INTRAVENOUS

## 2011-10-11 MED ORDER — BUPIVACAINE-EPINEPHRINE 0.25% -1:200000 IJ SOLN
INTRAMUSCULAR | Status: AC
  Filled 2011-10-11: qty 1

## 2011-10-11 MED ORDER — PROMETHAZINE HCL 25 MG/ML IJ SOLN: 12.5000 mg | Freq: Four times a day (QID) | INTRAMUSCULAR | Status: DC | PRN

## 2011-10-11 MED ORDER — ACETAMINOPHEN 650 MG RE SUPP
650.0000 mg | RECTAL | Status: DC | PRN
  Filled 2011-10-11: qty 1

## 2011-10-11 MED ORDER — ACETAMINOPHEN 10 MG/ML IV SOLN
INTRAVENOUS | Status: AC
  Filled 2011-10-11: qty 100

## 2011-10-11 MED ORDER — LACTATED RINGERS IV SOLN
INTRAVENOUS | Status: DC | PRN
  Administered 2011-10-11 (×2): via INTRAVENOUS

## 2011-10-11 MED ORDER — OXYCODONE HCL 5 MG PO TABS: 5.0000 mg | ORAL_TABLET | ORAL | Status: AC | PRN

## 2011-10-11 MED ORDER — 0.9 % SODIUM CHLORIDE (POUR BTL) OPTIME
TOPICAL | Status: DC | PRN
  Administered 2011-10-11: 1000 mL

## 2011-10-11 MED ORDER — ALVIMOPAN 12 MG PO CAPS
12.0000 mg | ORAL_CAPSULE | Freq: Once | ORAL | Status: AC
  Administered 2011-10-11: 12 mg via ORAL

## 2011-10-11 MED ORDER — SODIUM CHLORIDE 0.9 % IJ SOLN: 3.0000 mL | INTRAMUSCULAR | Status: DC | PRN

## 2011-10-11 MED ORDER — LACTATED RINGERS IV SOLN: INTRAVENOUS | Status: DC

## 2011-10-11 MED ORDER — MIDAZOLAM HCL 5 MG/5ML IJ SOLN
INTRAMUSCULAR | Status: DC | PRN
  Administered 2011-10-11 (×2): 1 mg via INTRAVENOUS

## 2011-10-11 MED ORDER — LIDOCAINE HCL (CARDIAC) 20 MG/ML IV SOLN
INTRAVENOUS | Status: DC | PRN
  Administered 2011-10-11: 80 mg via INTRAVENOUS

## 2011-10-11 MED ORDER — SODIUM CHLORIDE 0.9 % IV SOLN: 250.0000 mL | INTRAVENOUS | Status: DC | PRN

## 2011-10-11 MED ORDER — GLYCOPYRROLATE 0.2 MG/ML IJ SOLN
INTRAMUSCULAR | Status: DC | PRN
  Administered 2011-10-11: .6 mg via INTRAVENOUS

## 2011-10-11 MED ORDER — CISATRACURIUM BESYLATE 2 MG/ML IV SOLN
INTRAVENOUS | Status: DC | PRN
  Administered 2011-10-11: 6 mg via INTRAVENOUS
  Administered 2011-10-11: 3 mg via INTRAVENOUS

## 2011-10-11 MED ORDER — ONDANSETRON HCL 4 MG/2ML IJ SOLN
INTRAMUSCULAR | Status: DC | PRN
  Administered 2011-10-11 (×2): 2 mg via INTRAVENOUS

## 2011-10-11 MED ORDER — FENTANYL CITRATE 0.05 MG/ML IJ SOLN
INTRAMUSCULAR | Status: DC | PRN
  Administered 2011-10-11 (×2): 50 ug via INTRAVENOUS
  Administered 2011-10-11: 100 ug via INTRAVENOUS
  Administered 2011-10-11: 50 ug via INTRAVENOUS

## 2011-10-11 MED ORDER — PROPOFOL 10 MG/ML IV EMUL
INTRAVENOUS | Status: DC | PRN
  Administered 2011-10-11: 175 mg via INTRAVENOUS

## 2011-10-11 MED ORDER — KETOROLAC TROMETHAMINE 30 MG/ML IJ SOLN
INTRAMUSCULAR | Status: DC | PRN
  Administered 2011-10-11: 30 mg via INTRAVENOUS

## 2011-10-11 MED ORDER — PROMETHAZINE HCL 25 MG/ML IJ SOLN: 6.2500 mg | INTRAMUSCULAR | Status: DC | PRN

## 2011-10-11 MED ORDER — EPHEDRINE SULFATE 50 MG/ML IJ SOLN
INTRAMUSCULAR | Status: DC | PRN
  Administered 2011-10-11: 2.5 mg via INTRAVENOUS

## 2011-10-11 MED ORDER — SUCCINYLCHOLINE CHLORIDE 20 MG/ML IJ SOLN
INTRAMUSCULAR | Status: DC | PRN
  Administered 2011-10-11: 100 mg via INTRAVENOUS

## 2011-10-11 MED ORDER — ERTAPENEM SODIUM 1 G IJ SOLR
1.0000 g | INTRAMUSCULAR | Status: DC
  Filled 2011-10-11: qty 1

## 2011-10-11 MED ORDER — DEXAMETHASONE SODIUM PHOSPHATE 10 MG/ML IJ SOLN
INTRAMUSCULAR | Status: DC | PRN
  Administered 2011-10-11: 10 mg via INTRAVENOUS

## 2011-10-11 SURGICAL SUPPLY — 49 items
APPLIER CLIP 5 13 M/L LIGAMAX5 (MISCELLANEOUS)
APPLIER CLIP ROT 10 11.4 M/L (STAPLE)
APR CLP MED LRG 11.4X10 (STAPLE)
APR CLP MED LRG 5 ANG JAW (MISCELLANEOUS)
BAG SPEC RTRVL LRG 6X4 10 (ENDOMECHANICALS) ×1
CANISTER SUCTION 2500CC (MISCELLANEOUS) ×2 IMPLANT
CLIP APPLIE 5 13 M/L LIGAMAX5 (MISCELLANEOUS) IMPLANT
CLIP APPLIE ROT 10 11.4 M/L (STAPLE) IMPLANT
CLOTH BEACON ORANGE TIMEOUT ST (SAFETY) ×2 IMPLANT
CUTTER FLEX LINEAR 45M (STAPLE) IMPLANT
DECANTER SPIKE VIAL GLASS SM (MISCELLANEOUS) ×2 IMPLANT
DRAPE LAPAROSCOPIC ABDOMINAL (DRAPES) ×2 IMPLANT
DRSG TEGADERM 2-3/8X2-3/4 SM (GAUZE/BANDAGES/DRESSINGS) ×4 IMPLANT
DRSG TEGADERM 4X4.75 (GAUZE/BANDAGES/DRESSINGS) ×1 IMPLANT
ELECT REM PT RETURN 9FT ADLT (ELECTROSURGICAL) ×2
ELECTRODE REM PT RTRN 9FT ADLT (ELECTROSURGICAL) ×1 IMPLANT
ENDOLOOP SUT PDS II  0 18 (SUTURE)
ENDOLOOP SUT PDS II 0 18 (SUTURE) IMPLANT
GAUZE SPONGE 2X2 8PLY STRL LF (GAUZE/BANDAGES/DRESSINGS) IMPLANT
GLOVE BIOGEL PI IND STRL 7.0 (GLOVE) ×1 IMPLANT
GLOVE BIOGEL PI INDICATOR 7.0 (GLOVE) ×1
GLOVE ECLIPSE 8.0 STRL XLNG CF (GLOVE) ×2 IMPLANT
GLOVE INDICATOR 8.0 STRL GRN (GLOVE) ×4 IMPLANT
GOWN STRL NON-REIN LRG LVL3 (GOWN DISPOSABLE) ×2 IMPLANT
GOWN STRL REIN XL XLG (GOWN DISPOSABLE) ×4 IMPLANT
KIT BASIN OR (CUSTOM PROCEDURE TRAY) ×2 IMPLANT
NS IRRIG 1000ML POUR BTL (IV SOLUTION) ×2 IMPLANT
PENCIL BUTTON HOLSTER BLD 10FT (ELECTRODE) ×2 IMPLANT
POUCH SPECIMEN RETRIEVAL 10MM (ENDOMECHANICALS) ×1 IMPLANT
RELOAD 45 VASCULAR/THIN (ENDOMECHANICALS) IMPLANT
RELOAD BLUE (STAPLE) ×2 IMPLANT
RELOAD STAPLE 45 2.5 WHT GRN (ENDOMECHANICALS) IMPLANT
RELOAD STAPLE 45 3.5 BLU ETS (ENDOMECHANICALS) IMPLANT
RELOAD STAPLE TA45 3.5 REG BLU (ENDOMECHANICALS) IMPLANT
SET IRRIG TUBING LAPAROSCOPIC (IRRIGATION / IRRIGATOR) ×2 IMPLANT
SOLUTION ANTI FOG 6CC (MISCELLANEOUS) ×2 IMPLANT
SPONGE GAUZE 2X2 STER 10/PKG (GAUZE/BANDAGES/DRESSINGS) ×1
STAPLER ECHELON FLEX (STAPLE) ×1 IMPLANT
SUT MNCRL AB 4-0 PS2 18 (SUTURE) ×2 IMPLANT
SUT SILK 3 0 SH 30 (SUTURE) ×3 IMPLANT
SUT VIC AB 3-0 SH 27 (SUTURE) ×2
SUT VIC AB 3-0 SH 27XBRD (SUTURE) IMPLANT
TOWEL OR 17X26 10 PK STRL BLUE (TOWEL DISPOSABLE) ×2 IMPLANT
TRAY FOLEY CATH 14FRSI W/METER (CATHETERS) ×2 IMPLANT
TRAY LAP CHOLE (CUSTOM PROCEDURE TRAY) ×2 IMPLANT
TROCAR XCEL BLADELESS 5X75MML (TROCAR) ×2 IMPLANT
TROCAR Z-THREAD FIOS 12X100MM (TROCAR) ×2 IMPLANT
TROCAR Z-THREAD FIOS 5X100MM (TROCAR) ×2 IMPLANT
TUBING INSUFFLATION 10FT LAP (TUBING) ×2 IMPLANT

## 2011-10-11 NOTE — H&P (Signed)
HPI  Patient Care Team:  Gweneth Dimitri, MD as PCP - General (Family Medicine)  Rob Bunting, MD (Gastroenterology)  This patient is a 65 y.o.female who presents today for surgical evaluation.  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 Need for resection.  Patient is a pleasant woman with known history of colorectal problems in the past. She had a normal colonoscopy 10 years ago. She was given a screening followup. Dr. Christella Hartigan noted a polypoid mass coming out of the appendiceal orifice. Biopsies showed serrated adenoma. He sent to the patient to [place] for resection.  Patient normally has a bowel movement everyday. She can walk on a treadmill for 30 minutes without difficulty. No bleeding. No history of other GI problems. She comes today with her husband.  She saw [place] in 2010 for abdominal pains. She had a HIDA scan that showed a decreased gallbladder ejection fraction. However, when she saw [facility], she was asymptomatic. My partner, Dr. Dwain Sarna, did not feel that she required cholecystectomy at that time. The patient is still asymptomatic. She is eating fine now with no episodes of biliary colic. No bloating, nausea, vomiting, bleeding.  Past Medical History   Diagnosis  Date   .  Systemic lupus    .  Raynauds phenomenon    .  Sjogren's syndrome    .  Osteoarthritis    .  Hypothyroid    .  Environmental allergies    .  Osteoarthritis    .  Osteoarthritis    .  Osteopenia    .  Colon polyp     Past Surgical History   Procedure  Date   .  Bunionectomy  2008     right foot   .  Endometrial ablation    .  Colonoscopy w/ polypectomy  Aug 27, 2011    History    Social History   .  Marital Status:  Married     Spouse Name:  N/A     Number of Children:  N/A   .  Years of Education:  N/A    Occupational History   .  Not on file.    Social History Main Topics   .  Smoking status:  Never Smoker   .  Smokeless tobacco:  Not on file   .  Alcohol Use:  Yes   .  Drug Use:  No     .  Sexually Active:  Not on file    Other Topics  Concern   .  Not on file    Social History Narrative   .  No narrative on file    Family History   Problem  Relation  Age of Onset   .  Heart disease  Mother    .  Heart disease  Brother    .  Diabetes  Maternal Grandfather    .  Cancer  Maternal Uncle       colon    Current outpatient prescriptions:aspirin 81 MG tablet, Take 81 mg by mouth daily. , Disp: , Rfl: ; calcium carbonate (OS-CAL) 600 MG TABS, Take 600 mg by mouth 3 (three) times daily. , Disp: , Rfl: ; cholecalciferol (VITAMIN D) 1000 UNITS tablet, Take 1,000 Units by mouth 2 (two) times daily. , Disp: , Rfl: ; Coenzyme Q10 (CO Q 10 PO), Take 200 mg by mouth 2 times daily at 12 noon and 4 pm. , Disp: , Rfl:  desonide (DESOWEN) 0.05 % ointment, , Disp: , Rfl: ;  escitalopram (LEXAPRO) 10 MG tablet, , Disp: , Rfl: ; fexofenadine (ALLEGRA) 180 MG tablet, Take 180 mg by mouth daily. , Disp: , Rfl: ; fish oil-omega-3 fatty acids 1000 MG capsule, Take 2 g by mouth daily. , Disp: , Rfl: ; folic acid (FOLVITE) 1 MG tablet, , Disp: , Rfl:  Glucosamine-Chondroit-Vit C-Mn (GLUCOSAMINE CHONDR 1500 COMPLX PO), Take 1,500 mg by mouth 2 (two) times daily at 10 AM and 5 PM. , Disp: , Rfl: ; hydroxychloroquine (PLAQUENIL) 200 MG tablet, , Disp: , Rfl: ; Magnesium Malate POWD, 1,000 mg by Does not apply route 3 (three) times daily. This is actually a pill , Disp: , Rfl: ; Manganese 50 MG TABS, Take 50 mg by mouth daily. , Disp: , Rfl: ; meloxicam (MOBIC) 15 MG tablet, , Disp: , Rfl:  Multiple Vitamin (MULTIVITAMIN) tablet, Take 1 tablet by mouth daily. , Disp: , Rfl: ; SYNTHROID 75 MCG tablet, , Disp: , Rfl: ; Thiamine HCl (VITAMIN B-1) 100 MG tablet, Take 100 mg by mouth daily. , Disp: , Rfl: ; zolpidem (AMBIEN) 10 MG tablet, , Disp: , Rfl:  Allergies   Allergen  Reactions   .  Sulfonamide Derivatives      REACTION: headache    Review of Systems  Constitutional: Negative for fever, chills,  diaphoresis, appetite change and fatigue.  HENT: Negative for ear pain, sore throat, trouble swallowing, neck pain and ear discharge.  Eyes: Negative for photophobia, discharge and visual disturbance.  Respiratory: Negative for cough, choking, chest tightness and shortness of breath.  Cardiovascular: Negative for chest pain and palpitations.  Gastrointestinal: Negative for nausea, vomiting, abdominal pain, diarrhea, constipation, blood in stool, abdominal distention, anal bleeding and rectal pain.  Genitourinary: Negative for dysuria, frequency and difficulty urinating.  Musculoskeletal: Positive for arthralgias. Negative for myalgias, back pain and gait problem.  Skin: Negative for color change, pallor and rash.  Neurological: Negative for dizziness, speech difficulty, weakness and numbness.  Hematological: Negative for adenopathy.  Psychiatric/Behavioral: Negative for confusion and agitation. The patient is not nervous/anxious.    Objective:   Physical Exam  Constitutional: She is oriented to person, place, and time. She appears well-developed and well-nourished. No distress.  HENT:  Head: Normocephalic.  Mouth/Throat: Oropharynx is clear and moist. No oropharyngeal exudate.  Eyes: Conjunctivae and EOM are normal. Pupils are equal, round, and reactive to light. No scleral icterus.  Neck: Normal range of motion. Neck supple. No tracheal deviation present.  Cardiovascular: Normal rate, regular rhythm and intact distal pulses.  Pulmonary/Chest: Effort normal and breath sounds normal. No respiratory distress. She exhibits no tenderness.  Abdominal: Soft. She exhibits no distension and no mass. There is no tenderness. There is no rebound and no guarding. Hernia confirmed negative in the right inguinal area and confirmed negative in the left inguinal area.  Genitourinary: No vaginal discharge found.  Musculoskeletal: Normal range of motion. She exhibits no tenderness.  Lymphadenopathy:  She  has no cervical adenopathy.  Right: No inguinal adenopathy present.  Left: No inguinal adenopathy present.  Neurological: She is alert and oriented to person, place, and time. No cranial nerve deficit. She exhibits normal muscle tone. Coordination normal.  Skin: Skin is warm and dry. No rash noted. She is not diaphoretic. No erythema.  Psychiatric: She has a normal mood and affect. Her behavior is normal. Judgment and thought content normal.   Studies: Colonoscopy revealing any polypoid mass at the appendiceal orifice. Biopsy shows a serrated adenoma.  CT scan shows no  abnormalities. No mucocele. No carcinomatosis. No metastatic disease.   Assessment:    Serrated adenoma of appendiceal base   Plan:    The anatomy & physiology of the digestive tract was discussed. The pathophysiology of polyps was discussed. Natural history risks of carcinogenesis without surgery was discussed. I feel the risks of no intervention will lead to serious problems that outweigh the operative risks; therefore, I recommended diagnostic laparoscopy with removal of appendix to remove the pathology. Plan a wedge of cecum for good margins as well. Laparoscopic & possible open techniques were discussed. I noted a good likelihood this will help address the problem.  Risks such as bleeding, infection, abscess, leak, reoperation, possible ostomy, hernia, heart attack, death, and other risks were discussed. Goals of post-operative recovery were discussed as well. We will work to minimize complications. Questions were answered. The patient expresses understanding & wishes to proceed with surgery.   I have re-reviewed the the patient's records, history, medications, and allergies.  I have re-examined the patient.  I again discussed intraoperative plans and goals of post-operative recovery.  The patient agrees to proceed.

## 2011-10-11 NOTE — Preoperative (Signed)
Beta Blockers   Reason not to administer Beta Blockers:Not Applicable 

## 2011-10-11 NOTE — Anesthesia Postprocedure Evaluation (Signed)
  Anesthesia Post-op Note  Patient: [name]  Procedure(s) Performed:  APPENDECTOMY LAPAROSCOPIC - Partial Cecectomy and Appendectomy  Patient Location: PACU  Anesthesia Type: General  Level of Consciousness: awake and alert   Airway and Oxygen Therapy: Patient Spontanous Breathing  Post-op Pain: mild  Post-op Assessment: Post-op Vital signs reviewed, Patient's Cardiovascular Status Stable, Respiratory Function Stable, Patent Airway and No signs of Nausea or vomiting  Post-op Vital Signs: stable  Complications: No apparent anesthesia complications

## 2011-10-11 NOTE — Anesthesia Preprocedure Evaluation (Addendum)
Anesthesia Evaluation  Patient identified by MRN, date of birth, ID band Patient awake    Reviewed: Allergy & Precautions, H&P , NPO status , Patient's Chart, lab work & pertinent test results, reviewed documented beta blocker date and time   Airway Mallampati: II TM Distance: >3 FB Neck ROM: full    Dental  (+) Caps and Dental Advisory Given,    Pulmonary neg pulmonary ROS,  clear to auscultation  Pulmonary exam normal       Cardiovascular Exercise Tolerance: Good neg cardio ROS regular Normal    Neuro/Psych Raynaud's Negative Neurological ROS  Negative Psych ROS   GI/Hepatic negative GI ROS, Neg liver ROS, Poorly Controlled,  Endo/Other  Negative Endocrine ROS  Renal/GU negative Renal ROS  Genitourinary negative   Musculoskeletal   Abdominal   Peds  Hematology negative hematology ROS (+)   Anesthesia Other Findings Lupus Sjorgrens syndrome   Reproductive/Obstetrics negative OB ROS                          Anesthesia Physical Anesthesia Plan  ASA: III  Anesthesia Plan: General   Post-op Pain Management:    Induction: Intravenous  Airway Management Planned: Oral ETT  Additional Equipment:   Intra-op Plan:   Post-operative Plan: Extubation in OR  Informed Consent: I have reviewed the patients History and Physical, chart, labs and discussed the procedure including the risks, benefits and alternatives for the proposed anesthesia with the patient or authorized representative who has indicated his/her understanding and acceptance.   Dental Advisory Given  Plan Discussed with: CRNA and Surgeon  Anesthesia Plan Comments:         Anesthesia Quick Evaluation

## 2011-10-11 NOTE — Op Note (Signed)
NAME:  [name], [name] NO.:  [id number]  MEDICAL RECORD NO.:  [id number]  LOCATION:  [facility]                         FACILITY:  [facility]  PHYSICIAN:  Ardeth Sportsman, MD     DATE OF BIRTH:  [birth year]  DATE OF PROCEDURE: DATE OF DISCHARGE:  10/11/2011                              OPERATIVE REPORT   PRIMARY CARE PHYSICIAN:  Pam Drown, M.D.  GASTROENTEROLOGIST:  Rachael Fee, MD  SURGEON:  Ardeth Sportsman, MD.  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  POSTOPERATIVE DIAGNOSES: 1. 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 2. 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 3. 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 4. She has a fibroid on the posterior surface of her uterus, 4 x4 cm, as seen on pre-op CT scan.  PROCEDURES PERFORMED: 1. Laparoscopically assisted partial cecectomy including cecal     bascule with appendectomy.  ANESTHESIA: 1. General anesthesia. 2. Local anesthetic in a field block on all port sites.  SPECIMENS:  Cecum and appendix.  DRAINS:  None.  ESTIMATED BLOOD LOSS:  30 mL.  INDICATIONS:  [name] is a 65 year old female who was found on colonoscopy to have a polypoid mass at the appendiceal orifice secreting mucus.  Dr. Christella Hartigan was able to partially resect, but not completely resect.  It came back consistent with a serrated adenoma.  Based on concerns, he sent the patient to [place] for surgical excision.  The anatomy and physiology of the digestive tract was explained. Pathophysiology of polyps with their natural history and risks were discussed.  Options were discussed and recommendation made for an appendectomy with  probable partial cecal wall resection given the fact of the appendiceal orifice.  Possible need for ileocolonic resection if more significant was discussed.  Technique,  risks, benefits, alternatives were discussed.  Questions were answered and she agreed to proceed.  OPERATIVE FINDINGS:  She had a moderate cecal bascule without volvulus. She had a long narrow appendix.  There was abnormal mucosa at the appendiceal orifice, fatty intraluminal mass c/w probable lipoma of cecum.  She had a fibroid in the posterior wall of her uterus as seen on preoperative CT scan.  There was no evidence of any metastatic disease or mucinous peritoneum.  DESCRIPTION OF PROCEDURE:  Informed consent was confirmed.  The patient received IV Invanz prior to incision.  She underwent general anesthesia without difficulty.  She was positioned supine with arms tucked.  Her abdomen and mons pubis were clipped, prepped, and draped in sterile fashion.  Surgical time-out confirmed our plan.  I placed a number 5 mm port through the inferior part of the umbilicus using a cut down Hasson technique.  Entry was clean.  I induced carbon dioxide insufflation.  Camera inspection revealed no injury.  There were no adhesions to the anterior abdominal wall.  Under direct visualization I placed a 5 mm port in the suprapubic region away from the bladder.  I up-sized the umbilical port to a 12 mm stapler port.  I placed a 5 mm port in the  left mid abdomen.  I turned attention to the diagnostic laparoscopy.  There were no significant adhesions.  I could easily find the ileocecal region.  I mobilized the terminal ileum and ascending colon in a lateral-to-medial fashion using sharp scissors with monopolar at the line of Toldt.  I could see a long appendix that was rather narrowed.  She had a floppy cecum with a bascule.  Because I could not feel an obvious mass at the appendiceal orifice I decided to make sure I got good margins.  I went ahead and transected her mesoappendix and scored some of the cecal mesentery with Harmonic scalpel.  During dissection, there was a small breach in the cecum.  I  could see that hole and control it.  I was able to get the stapler in after doing 2 firings and excised the majority of the cecal bascule off, recognizing and sparing the ileocecal junction and the ileocecal valve.  There was some bleeding at the staple line, which is why I had to do a third firing to help staple off that corner and with that I had good hemostasis.  I had placed the cecum into the bag and removed it.  I opened the cecum at the antimesenteric side and found at the appendiceal orifice there was some abnormal mucosa there.  There was more fatty lipoma sitting at the cecal wall as well. It did not seem consistent with the ileocecal valve.  I saw no other abnormalities.  I turned attention and ensured hemostasis.  I went ahead and inspected the staple line.  I saw no leak nor injury.  The staple line did have a little mild oozing.  I therefore used some of the redundant cecal mesentery and tacked that over the staple line as sort of a mesenteric patch.  I used 3-0 Vicryl and 3-0 silk stitches on the cecal side and through the mesentery and then on the terminal ileum and gently snugged those down to help provide a good vasculitis mesenteric patch over the staple line for good hemostasis.  I did copious irrigation, over a liter, and ensured good hemostasis. There was no contamination or other abnormality.  The only abnormal finding I found was the fibroid on the uterus, which was seen on preoperative CT scan.  Otherwise it was unremarkable.  I evacuated the carbon dioxide and removed the ports.  I closed the umbilical fascia using interrupted 0 Vicryl stitches after I had to dilate up to get the cecum out.  Closure was good.  I closed the skin using Monocryl stitch.  Sterile dressing was applied.  The patient was extubated and taken to the recovery room in stable condition.  I am hopeful that she can go home later today if she meets discharge goals, otherwise we will  observe her.     Ardeth Sportsman, MD     SCG/MEDQ  D:  10/11/2011  T:  10/11/2011  Job:  [id number]  cc:   Pam Drown, M.D. Fax: [phone]  Rachael Fee, MD [address] [place], [place] [address]

## 2011-10-11 NOTE — Brief Op Note (Signed)
10/11/2011  11:49 AM  PATIENT:  [name]  65 y.o. 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 Cecal bascule  PROCEDURE:  Procedure(s):  LAPAROSCOPIC resection of cecal bascule with appendix  SURGEON:  Surgeon(s): Ardeth Sportsman, MD  ASSISTANTS: RN   ANESTHESIA:   local and general  EBL:  Total I/O In: 1000 [I.V.:1000] Out: 150 [Urine:150]  BLOOD ADMINISTERED:none  DRAINS: none   LOCAL MEDICATIONS USED:  BUPIVICAINE 50CC  SPECIMEN:  Source of Specimen:  Cecal bascule with appendix and Aspirate  DISPOSITION OF SPECIMEN:  PATHOLOGY  COUNTS:  YES  TOURNIQUET:  * No tourniquets in log *  DICTATION: .Other Dictation: Dictation Number [id number]  PLAN OF CARE: Discharge to home after PACU  PATIENT DISPOSITION:  PACU - hemodynamically stable.   Delay start of Pharmacological VTE agent (>24hrs) due to surgical blood loss or risk of bleeding:  NOT APPLICABLE

## 2011-10-11 NOTE — Transfer of Care (Signed)
Immediate Anesthesia Transfer of Care Note  Patient: [name]  Procedure(s) Performed:  APPENDECTOMY LAPAROSCOPIC - Partial Cecectomy and Appendectomy  Patient Location: PACU  Anesthesia Type: General  Level of Consciousness: awake, alert , oriented, patient cooperative and responds to stimulation  Airway & Oxygen Therapy: Patient Spontanous Breathing and Patient connected to face mask oxygen  Post-op Assessment: Report given to PACU RN, Post -op Vital signs reviewed and stable and Patient moving all extremities  Post vital signs: Reviewed and stable  Complications: No apparent anesthesia complications

## 2011-10-12 ENCOUNTER — Telehealth (INDEPENDENT_AMBULATORY_CARE_PROVIDER_SITE_OTHER): Payer: Self-pay | Admitting: Surgery

## 2011-10-12 NOTE — Telephone Encounter (Signed)
Needs a 2wk po, discharged on 10/11/11, please call.

## 2011-10-13 ENCOUNTER — Emergency Department (HOSPITAL_COMMUNITY): Payer: Medicare Other

## 2011-10-13 ENCOUNTER — Inpatient Hospital Stay (HOSPITAL_COMMUNITY)
Admission: EM | Admit: 2011-10-13 | Discharge: 2011-10-19 | DRG: 394 | Disposition: A | Discharge status: Home / Self Care | Payer: Medicare Other | Attending: Surgery | Admitting: Surgery

## 2011-10-13 ENCOUNTER — Encounter (HOSPITAL_COMMUNITY): Payer: Self-pay | Admitting: *Deleted

## 2011-10-13 ENCOUNTER — Telehealth (INDEPENDENT_AMBULATORY_CARE_PROVIDER_SITE_OTHER): Payer: Self-pay | Admitting: General Surgery

## 2011-10-13 DIAGNOSIS — M329 Systemic lupus erythematosus, unspecified: Secondary | ICD-10-CM | POA: Diagnosis present

## 2011-10-13 DIAGNOSIS — Y838 Other surgical procedures as the cause of abnormal reaction of the patient, or of later complication, without mention of misadventure at the time of the procedure: Secondary | ICD-10-CM | POA: Diagnosis present

## 2011-10-13 DIAGNOSIS — E039 Hypothyroidism, unspecified: Secondary | ICD-10-CM | POA: Diagnosis present

## 2011-10-13 DIAGNOSIS — M3500 Sicca syndrome, unspecified: Secondary | ICD-10-CM | POA: Diagnosis present

## 2011-10-13 DIAGNOSIS — K56609 Unspecified intestinal obstruction, unspecified as to partial versus complete obstruction: Secondary | ICD-10-CM | POA: Diagnosis present

## 2011-10-13 DIAGNOSIS — D126 Benign neoplasm of colon, unspecified: Secondary | ICD-10-CM | POA: Diagnosis present

## 2011-10-13 DIAGNOSIS — K929 Disease of digestive system, unspecified: Principal | ICD-10-CM | POA: Diagnosis present

## 2011-10-13 DIAGNOSIS — K5669 Other intestinal obstruction: Secondary | ICD-10-CM | POA: Diagnosis present

## 2011-10-13 HISTORY — DX: Allergy, unspecified, initial encounter: T78.40XA

## 2011-10-13 LAB — CBC
Hemoglobin: 15.7 g/dL — ABNORMAL HIGH (ref 12.0–15.0)
MCH: 33.4 pg (ref 26.0–34.0)
MCHC: 34.4 g/dL (ref 30.0–36.0)
MCV: 97.2 fL (ref 78.0–100.0)
RBC: 4.7 MIL/uL (ref 3.87–5.11)

## 2011-10-13 LAB — URINE MICROSCOPIC-ADD ON

## 2011-10-13 LAB — COMPREHENSIVE METABOLIC PANEL
BUN: 15 mg/dL (ref 6–23)
CO2: 30 mEq/L (ref 19–32)
Calcium: 10.6 mg/dL — ABNORMAL HIGH (ref 8.4–10.5)
Creatinine, Ser: 0.74 mg/dL (ref 0.50–1.10)
GFR calc Af Amer: >90 mL/min (ref >90)
GFR calc non Af Amer: 87 mL/min — ABNORMAL LOW (ref >90)
Glucose, Bld: 135 mg/dL — ABNORMAL HIGH (ref 70–99)
Total Protein: 8.1 g/dL (ref 6.0–8.3)

## 2011-10-13 LAB — URINALYSIS, ROUTINE W REFLEX MICROSCOPIC
Bilirubin Urine: NEGATIVE
Leukocytes, UA: NEGATIVE
Nitrite: NEGATIVE
Specific Gravity, Urine: 1.011 (ref 1.005–1.030)
Urobilinogen, UA: 0.2 mg/dL (ref 0.0–1.0)
pH: 6 (ref 5.0–8.0)

## 2011-10-13 MED ORDER — SODIUM CHLORIDE 0.9 % IV SOLN
Freq: Once | INTRAVENOUS | Status: AC
  Administered 2011-10-13: 21:00:00 via INTRAVENOUS

## 2011-10-13 MED ORDER — ONDANSETRON HCL 4 MG/2ML IJ SOLN
4.0000 mg | Freq: Once | INTRAMUSCULAR | Status: AC
  Administered 2011-10-13: 4 mg via INTRAVENOUS
  Filled 2011-10-13: qty 2

## 2011-10-13 MED ORDER — WHITE PETROLATUM GEL
Status: AC
  Administered 2011-10-13: 1
  Filled 2011-10-13: qty 5

## 2011-10-13 MED ORDER — SODIUM CHLORIDE 0.9 % IV BOLUS (SEPSIS)
1000.0000 mL | Freq: Once | INTRAVENOUS | Status: AC
  Administered 2011-10-13: 1000 mL via INTRAVENOUS

## 2011-10-13 MED ORDER — MORPHINE SULFATE 4 MG/ML IJ SOLN
6.0000 mg | Freq: Once | INTRAMUSCULAR | Status: AC
  Administered 2011-10-13: 4 mg via INTRAVENOUS
  Filled 2011-10-13: qty 1

## 2011-10-13 MED ORDER — IOHEXOL 300 MG/ML  SOLN
100.0000 mL | Freq: Once | INTRAMUSCULAR | Status: AC | PRN
  Administered 2011-10-13: 100 mL via INTRAVENOUS

## 2011-10-13 MED ORDER — MORPHINE SULFATE 2 MG/ML IJ SOLN
INTRAMUSCULAR | Status: AC
  Administered 2011-10-13: 2 mg via INTRAVENOUS
  Filled 2011-10-13: qty 1

## 2011-10-13 NOTE — ED Notes (Signed)
Pt. C/o of discomfort, fullness in abdomen. Pain 5/10. States she is unable to complete second cup of Contrast.

## 2011-10-13 NOTE — ED Notes (Signed)
Patient transported to CT 

## 2011-10-13 NOTE — H&P (Signed)
Reason for Consult:abdominal pain Referring Physician: [name] is an 65 y.o. female.  HPI: this patient is known to our practice for any recent appendectomy and resection of cecal bascule by Dr. Michaell Cowing earlier in the week. She states that she was doing very well and not taking any pain medication postoperatively until she began having abdominal pain yesterday and progressed throughout the night and associated abdominal dilation and nausea. She has had no appetite and has had difficulty maintaining hydration. She has not had fevers and chills.  Past Medical History  Diagnosis Date  . Systemic lupus   . Raynauds phenomenon   . Sjogren's syndrome   . Osteoarthritis   . Hypothyroid   . Environmental allergies   . Osteoarthritis   . Osteoarthritis   . Osteopenia   . Colon polyp     Past Surgical History  Procedure Date  . Bunionectomy 2008    right foot  . Endometrial ablation   . Colonoscopy w/ polypectomy Aug 27, 2011  . Appendectomy     Family History  Problem Relation Age of Onset  . Heart disease Mother   . Heart disease Brother   . Diabetes Maternal Grandfather   . Cancer Maternal Uncle     colon    Social History:  reports that she has never smoked. She has never used smokeless tobacco. She reports that she does not drink alcohol or use illicit drugs.  Allergies:  Allergies  Allergen Reactions  . Sulfonamide Derivatives     REACTION: headache    Medications: I have reviewed the patient's current medications.  Results for orders placed during the hospital encounter of 10/13/11 (from the past 48 hour(s))  CBC     Status: Abnormal   Collection Time   10/13/11  4:30 PM      Component Value Range Comment   WBC 16.2 (*) 4.0 - 10.5 (K/uL)    RBC 4.70  3.87 - 5.11 (MIL/uL)    Hemoglobin 15.7 (*) 12.0 - 15.0 (g/dL)    HCT 16.1  09.6 - 04.5 (%)    MCV 97.2  78.0 - 100.0 (fL)    MCH 33.4  26.0 - 34.0 (pg)    MCHC 34.4  30.0 - 36.0 (g/dL)    RDW 40.9   81.1 - 91.4 (%)    Platelets 275  150 - 400 (K/uL)   COMPREHENSIVE METABOLIC PANEL     Status: Abnormal   Collection Time   10/13/11  4:30 PM      Component Value Range Comment   Sodium 139  135 - 145 (mEq/L)    Potassium 4.0  3.5 - 5.1 (mEq/L)    Chloride 99  96 - 112 (mEq/L)    CO2 30  19 - 32 (mEq/L)    Glucose, Bld 135 (*) 70 - 99 (mg/dL)    BUN 15  6 - 23 (mg/dL)    Creatinine, Ser 7.82  0.50 - 1.10 (mg/dL)    Calcium 95.6 (*) 8.4 - 10.5 (mg/dL)    Total Protein 8.1  6.0 - 8.3 (g/dL)    Albumin 3.7  3.5 - 5.2 (g/dL)    AST 18  0 - 37 (U/L)    ALT 13  0 - 35 (U/L)    Alkaline Phosphatase 57  39 - 117 (U/L)    Total Bilirubin 0.4  0.3 - 1.2 (mg/dL)    GFR calc non Af Amer 87 (*) >90 (mL/min)    GFR calc  Af Amer >90  >90 (mL/min)     Ct Abdomen Pelvis W Contrast  10/13/2011  *RADIOLOGY REPORT*  Clinical Data: Right-sided abdominal pain.  The patient is postop day 08/02 from appendectomy.  Worsening abdominal pain with nausea vomiting.  CT ABDOMEN AND PELVIS WITH CONTRAST  Technique:  Multidetector CT imaging of the abdomen and pelvis was performed following the standard protocol during bolus administration of intravenous contrast.  Contrast: OMNIPAQUE IOHEXOL 300 MG/ML IV SOLN  Comparison: 08/31/2011  Findings: Multiple well-defined low density lesions in the liver are stable, likely reflects cysts.  The spleen is normal.  The stomach and is markedly distended.  The duodenum is distended. Pancreas, gallbladder, and adrenal glands are unremarkable.  7 mm low density lesion in the right kidney has progressed in the interval.  This has attenuation too high allow classification as a simple cyst.  No abdominal aortic aneurysm.  There is a small amount of fluid adjacent to the liver.  Intraperitoneal free air is noted.  There is diffuse dilatation of small bowel.  Small bowel loops in the left abdomen measure up to 4.6 cm in diameter.  There is interloop mesenteric fluid in multiple  locations.  The fluid filled, dilated small bowel remains distended all the way through to the level of the anastomosis.  The colon is decompressed without fluid or gas.  Imaging through the pelvis shows a distended urinary bladder. 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  There is no adnexal mass.  Bone windows reveal no worrisome lytic or sclerotic osseous lesions.  IMPRESSION: Diffuse fluid filled, dilated small bowel wall way through to the ileocolic anastomosis.  The colon is completely decompressed without air or fluid in the lumen.  Imaging features are compatible with mechanical obstruction at the level of the anastomoses.  There is a small amount of free intraperitoneal fluid with scattered areas of interloop mesenteric fluid as well.  Intraperitoneal free air.  This is not unexpected 2 days out from surgery.  7 mm low density lesion in the right kidney has progressed since the CT scan about a month ago.  This has attenuation too high to allow classification as a simple cyst.  It may be a cyst complicated by proteinaceous debris or hemorrhage, but follow-up dedicated renal MRI is recommended to confirm.  I personally called these results to Dr. Patria Mane at 2049 hours on 10/13/2011.  Original Report Authenticated By: ERIC A. MANSELL, M.D.   Dg Abd Acute W/chest  10/13/2011  *RADIOLOGY REPORT*  Clinical Data: Abdominal pain, nausea.  Recent partial colectomy.  ACUTE ABDOMEN SERIES (ABDOMEN 2 VIEW & CHEST 1 VIEW)  Comparison: CT 08/31/2011  Findings: There is free air under the hemidiaphragms, likely related to recent abdominal surgery.  There is a nonobstructive bowel gas pattern.  No organomegaly or suspicious calcification.  Heart and mediastinal contours are within normal limits.  No focal opacities or effusions.  No acute bony abnormality.  IMPRESSION: Free intraperitoneal air, presumably related to recent abdominal surgery.  Original Report Authenticated By: Cyndie Chime, M.D.     @ROS @ Blood pressure 145/73, pulse 81, temperature 97.8 F (36.6 C), temperature source Oral, resp. rate 18, weight 152 lb (68.947 kg), SpO2 100.00%. General appearance: alert, cooperative and no distress Head: Normocephalic, without obvious abnormality, atraumatic Neck: no adenopathy, no JVD and supple, symmetrical, trachea midline Resp: clear to auscultation bilaterally Cardio: regular rate and rhythm, S1, S2 normal, no murmur, click, rub or gallop GI: soft, mild lower  abdominal tenderness, +distension, some bruising around umbilicus but no sign of infection, no evidence of hernia Extremities: extremities normal, atraumatic, no cyanosis or edema Pulses: 2+ and symmetric Neurologic: Grossly normal  Assessment/Plan: Post-operative bowel obstruction vs. Ileus. History and exam and CT scan this is concerning for postoperative bowel obstruction. This may be due to edema near the staple line versus ileus versus physical obstruction of terminal ileum from the staple line. I have recommended NG tube decompression with admission for IV hydration and continued abdominal exams. We will see her she progresses with NG tube decompression and bowel rest. Shows no evidence of acute abdomen or need for immediate reoperation. She does have some free air which is likely due to her recent procedure. She does not have a clinical exam consistent with intra-abdominal catastrophe. Lodema Pilot DAVID 10/13/2011, 9:31 PM

## 2011-10-13 NOTE — ED Provider Notes (Signed)
History     CSN: [id number]  Arrival date & time 10/13/11  1512   First MD Initiated Contact with Patient 10/13/11 1604      Chief Complaint  Patient presents with  . 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  She reports she went home was feeling better however over the past 24 hours she's developed new abdominal pain as well as associated nausea and vomiting.  She does report she is continuing to pass flatus.  She did have one loose stool yesterday.  She feels like her abdomen is mildly distended.  She denies fever and chills.  She reports just feeling "ill".  She called her surgeon who recommended she come to the emergency room for evaluation.  The patient was seen on arrival to the emergency department both by myself as well as by Dr. Biagio Quint in the triage area.   Past Medical History  Diagnosis Date  . Systemic lupus   . Raynauds phenomenon   . Sjogren's syndrome   . Osteoarthritis   . Hypothyroid   . Environmental allergies   . Osteoarthritis   . Osteoarthritis   . Osteopenia   . Colon polyp     Past Surgical History  Procedure Date  . Bunionectomy 2008    right foot  . Endometrial ablation   . Colonoscopy w/ polypectomy Aug 27, 2011  . Appendectomy     Family History  Problem Relation Age of Onset  . Heart disease Mother   . Heart disease Brother   . Diabetes Maternal Grandfather   . Cancer Maternal Uncle     colon    History  Substance Use Topics  . Smoking status: Never Smoker   . Smokeless tobacco: Never Used  . Alcohol Use: No    OB History    Grav Para Term Preterm Abortions TAB SAB Ect Mult Living                  Review of Systems  All other systems reviewed and are negative.    Allergies  Sulfonamide derivatives  Home Medications   Current Outpatient Rx  Name Route Sig Dispense Refill  . ASPIRIN 81 MG PO TABS Oral Take 81 mg by mouth at bedtime.     Marland Kitchen BL  MAGNESIUM PO Oral Take 1,000 mg by mouth 3 (three) times daily.     Marland Kitchen CALCIUM CARBONATE 600 MG PO TABS Oral Take 600 mg by mouth daily.     Marland Kitchen VITAMIN D 1000 UNITS PO TABS Oral Take 2,000 Units by mouth daily.     . CO Q 10 PO Oral Take 200 mg by mouth 2 times daily at 12 noon and 4 pm.     . DESONIDE 0.05 % EX OINT Topical Apply 1 application topically 2 (two) times daily as needed. ITCHING     . ESCITALOPRAM OXALATE 10 MG PO TABS Oral Take 10 mg by mouth every morning.     Marland Kitchen FEXOFENADINE HCL 180 MG PO TABS Oral Take 180 mg by mouth daily.     . OMEGA-3 FATTY ACIDS 1000 MG PO CAPS Oral Take 2 g by mouth daily.     Marland Kitchen FOLIC ACID 1 MG PO TABS Oral Take 1 mg by mouth daily.     Marland Kitchen GLUCOSAMINE CHONDR 1500 COMPLX PO Oral Take 1,500 mg by mouth 2 (two) times daily at 10 AM and 5  PM.     . HYDROXYCHLOROQUINE SULFATE 200 MG PO TABS Oral Take 200 mg by mouth 2 (two) times daily.     Marland Kitchen LEVOTHYROXINE SODIUM 88 MCG PO TABS Oral Take 88 mcg by mouth every morning.     Marland Kitchen MANGANESE 50 MG PO TABS Oral Take 50 mg by mouth daily.     . MELOXICAM 15 MG PO TABS Oral Take 15 mg by mouth daily.     Marland Kitchen ONE-DAILY MULTI VITAMINS PO TABS Oral Take 1 tablet by mouth daily.     . OXYCODONE HCL 5 MG PO TABS Oral Take 1-2 tablets (5-10 mg total) by mouth every 4 (four) hours as needed for pain. 50 tablet 0  . POLYETHYL GLYCOL-PROPYL GLYCOL 0.4-0.3 % OP SOLN Ophthalmic Apply 1 drop to eye 2 (two) times daily.     Marland Kitchen VITAMIN B-1 100 MG PO TABS Oral Take 100 mg by mouth daily.     Marland Kitchen ZOLPIDEM TARTRATE 10 MG PO TABS Oral Take 5 mg by mouth at bedtime as needed. SLEEP       BP 145/73  Pulse 81  Temp(Src) 97.8 F (36.6 C) (Oral)  Resp 18  Wt 152 lb (68.947 kg)  SpO2 100%  Physical Exam  Nursing note and vitals reviewed. Constitutional: She is oriented to person, place, and time. She appears well-developed and well-nourished. No distress.  HENT:  Head: Normocephalic and atraumatic.  Eyes: EOM are normal.  Neck: Normal  range of motion.  Cardiovascular: Normal rate, regular rhythm and normal heart sounds.   Pulmonary/Chest: Effort normal and breath sounds normal.  Abdominal: Soft.       Healing laparoscopic scar with a small amount of erythema superiorly to this.  There is no frank drainage from this area.  She does have mild tenderness in the right side of her abdomen without focality.  No rebound.  No peritonitis on exam.  Musculoskeletal: Normal range of motion.  Neurological: She is alert and oriented to person, place, and time.  Skin: Skin is warm and dry.  Psychiatric: She has a normal mood and affect. Judgment normal.    ED Course  Procedures (including critical care time)  Labs Reviewed  CBC - Abnormal; Notable for the following:    WBC 16.2 (*)    Hemoglobin 15.7 (*)    All other components within normal limits  COMPREHENSIVE METABOLIC PANEL - Abnormal; Notable for the following:    Glucose, Bld 135 (*)    Calcium 10.6 (*)    GFR calc non Af Amer 87 (*)    All other components within normal limits  URINALYSIS, ROUTINE W REFLEX MICROSCOPIC   Ct Abdomen Pelvis W Contrast  10/13/2011  *RADIOLOGY REPORT*  Clinical Data: Right-sided abdominal pain.  The patient is postop day 08/02 from appendectomy.  Worsening abdominal pain with nausea vomiting.  CT ABDOMEN AND PELVIS WITH CONTRAST  Technique:  Multidetector CT imaging of the abdomen and pelvis was performed following the standard protocol during bolus administration of intravenous contrast.  Contrast: OMNIPAQUE IOHEXOL 300 MG/ML IV SOLN  Comparison: 08/31/2011  Findings: Multiple well-defined low density lesions in the liver are stable, likely reflects cysts.  The spleen is normal.  The stomach and is markedly distended.  The duodenum is distended. Pancreas, gallbladder, and adrenal glands are unremarkable.  7 mm low density lesion in the right kidney has progressed in the interval.  This has attenuation too high allow classification as a  simple cyst.  No  abdominal aortic aneurysm.  There is a small amount of fluid adjacent to the liver.  Intraperitoneal free air is noted.  There is diffuse dilatation of small bowel.  Small bowel loops in the left abdomen measure up to 4.6 cm in diameter.  There is interloop mesenteric fluid in multiple locations.  The fluid filled, dilated small bowel remains distended all the way through to the level of the anastomosis.  The colon is decompressed without fluid or gas.  Imaging through the pelvis shows a distended urinary bladder. 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  There is no adnexal mass.  Bone windows reveal no worrisome lytic or sclerotic osseous lesions.  IMPRESSION: Diffuse fluid filled, dilated small bowel wall way through to the ileocolic anastomosis.  The colon is completely decompressed without air or fluid in the lumen.  Imaging features are compatible with mechanical obstruction at the level of the anastomoses.  There is a small amount of free intraperitoneal fluid with scattered areas of interloop mesenteric fluid as well.  Intraperitoneal free air.  This is not unexpected 2 days out from surgery.  7 mm low density lesion in the right kidney has progressed since the CT scan about a month ago.  This has attenuation too high to allow classification as a simple cyst.  It may be a cyst complicated by proteinaceous debris or hemorrhage, but follow-up dedicated renal MRI is recommended to confirm.  I personally called these results to Dr. Patria Mane at 2049 hours on 10/13/2011.  Original Report Authenticated By: ERIC A. MANSELL, M.D.   Dg Abd Acute W/chest  10/13/2011  *RADIOLOGY REPORT*  Clinical Data: Abdominal pain, nausea.  Recent partial colectomy.  ACUTE ABDOMEN SERIES (ABDOMEN 2 VIEW & CHEST 1 VIEW)  Comparison: CT 08/31/2011  Findings: There is free air under the hemidiaphragms, likely related to recent abdominal surgery.  There is a nonobstructive bowel gas pattern.  No organomegaly  or suspicious calcification.  Heart and mediastinal contours are within normal limits.  No focal opacities or effusions.  No acute bony abnormality.  IMPRESSION: Free intraperitoneal air, presumably related to recent abdominal surgery.  Original Report Authenticated By: Cyndie Chime, M.D.   i Personally evaluated the x-ray and CT scan. I discussed the findings of the CT scan with the radiologist  1. Small bowel obstruction       MDM  Patient with no evidence of bowel obstruction on plain films.  There is a small amount of free air however this is secondary to her recent laparoscopic surgery as she has no peritonitis on exam.  She was evaluated at the bedside both by myself as well as Dr Biagio Quint.  Her symptoms aren't suggestive of ileus however given her right-sided abdominal pain and or worsening of her symptoms I do think it is important to evaluate with a CT scan for possible intra-abdominal pathology as her white count is 16,000.  Although white count 16,000 with surgery as not that atypical and is likely reactive.  Symptomatically we will to make the patient feel better.  Spoke with Dr Biagio Quint who requests NG tube and will evaluate the patient for admission and ongoing tx. Pt and family updated        Lyanne Co, MD 10/14/11 [phone]

## 2011-10-13 NOTE — Telephone Encounter (Signed)
Called c/o nausea and decreased appetite and increased abdominal pain when she had been doing well previously.  I recommended that she come to the ER for evaluation and likely readmission for hydration.

## 2011-10-13 NOTE — ED Notes (Signed)
Appendectomy and pylop removal on Thursday, Nausea and weakness today with decreased intake

## 2011-10-13 NOTE — ED Notes (Signed)
Pt. Unable to drink all of contrast. Has drank 1/2 cups, will notify CT.

## 2011-10-14 ENCOUNTER — Encounter (HOSPITAL_COMMUNITY): Payer: Self-pay | Admitting: *Deleted

## 2011-10-14 LAB — CBC
MCV: 98.2 fL (ref 78.0–100.0)
Platelets: 223 10*3/uL (ref 150–400)
RBC: 3.84 MIL/uL — ABNORMAL LOW (ref 3.87–5.11)
RDW: 13.1 % (ref 11.5–15.5)
WBC: 10.3 10*3/uL (ref 4.0–10.5)

## 2011-10-14 LAB — BASIC METABOLIC PANEL
Chloride: 108 mEq/L (ref 96–112)
Creatinine, Ser: 0.64 mg/dL (ref 0.50–1.10)
GFR calc Af Amer: >90 mL/min (ref >90)
Sodium: 140 mEq/L (ref 135–145)

## 2011-10-14 MED ORDER — ENOXAPARIN SODIUM 40 MG/0.4ML ~~LOC~~ SOLN
40.0000 mg | SUBCUTANEOUS | Status: DC
  Administered 2011-10-14 – 2011-10-18 (×5): 40 mg via SUBCUTANEOUS
  Filled 2011-10-14 (×6): qty 0.4

## 2011-10-14 MED ORDER — KCL IN DEXTROSE-NACL 20-5-0.45 MEQ/L-%-% IV SOLN
INTRAVENOUS | Status: DC
  Administered 2011-10-14 – 2011-10-18 (×9): via INTRAVENOUS
  Filled 2011-10-14 (×13): qty 1000

## 2011-10-14 MED ORDER — BIOTENE DRY MOUTH MT LIQD
15.0000 mL | Freq: Two times a day (BID) | OROMUCOSAL | Status: DC
  Administered 2011-10-14 – 2011-10-18 (×8): 15 mL via OROMUCOSAL

## 2011-10-14 MED ORDER — HYDROXYCHLOROQUINE SULFATE 200 MG PO TABS
200.0000 mg | ORAL_TABLET | Freq: Two times a day (BID) | ORAL | Status: DC
  Administered 2011-10-14 – 2011-10-18 (×10): 200 mg via ORAL
  Filled 2011-10-14 (×12): qty 1

## 2011-10-14 MED ORDER — MORPHINE SULFATE 2 MG/ML IJ SOLN: 2.0000 mg | INTRAMUSCULAR | Status: DC | PRN

## 2011-10-14 MED ORDER — LEVOTHYROXINE SODIUM 88 MCG PO TABS
88.0000 ug | ORAL_TABLET | ORAL | Status: DC
  Administered 2011-10-14: 88 ug via ORAL
  Filled 2011-10-14 (×2): qty 1

## 2011-10-14 MED ORDER — LEVOTHYROXINE SODIUM 100 MCG IV SOLR
50.0000 ug | Freq: Every day | INTRAVENOUS | Status: DC
  Administered 2011-10-15 – 2011-10-18 (×4): 50 ug via INTRAVENOUS
  Filled 2011-10-14 (×5): qty 2.5

## 2011-10-14 MED ORDER — ONDANSETRON HCL 4 MG/2ML IJ SOLN: 4.0000 mg | Freq: Four times a day (QID) | INTRAMUSCULAR | Status: DC | PRN

## 2011-10-14 MED ORDER — FAMOTIDINE IN NACL 20-0.9 MG/50ML-% IV SOLN
20.0000 mg | Freq: Two times a day (BID) | INTRAVENOUS | Status: DC
  Administered 2011-10-14 – 2011-10-18 (×9): 20 mg via INTRAVENOUS
  Filled 2011-10-14 (×11): qty 50

## 2011-10-14 NOTE — Progress Notes (Signed)
Subjective: Feels better but is not completely pain free. No stool or flatus. Ambulatory. Voiding uneventfully.  NG drainage is fairly low volume. WBC down from 16.2-10.3. No x-rays today.  Objective: Vital signs in last 24 hours: Temp:  [97.8 F (36.6 C)-98.7 F (37.1 C)] 97.8 F (36.6 C) (12/23 0542) Pulse Rate:  [70-86] 70  (12/23 0542) Resp:  [16-18] 18  (12/23 0542) BP: (118-145)/(59-73) 119/64 mmHg (12/23 0542) SpO2:  [94 %-100 %] 96 % (12/23 0542) Weight:  [152 lb (68.947 kg)] 152 lb (68.947 kg) (12/23 0321) Last BM Date: 10/13/11  Intake/Output from previous day: 12/22 0701 - 12/23 0700 In: 307.7 [I.V.:257.7; IV Piggyback:50] Out: 910 [Urine:800; Emesis/NG output:55] Intake/Output this shift:    General appearance: looks comfortable other than NG tube discomfort. Does not appear to be in any distress. GI: abdomen is distended but is quite soft and  essentially nontender without guarding or rebound. Wounds looked fine. Bowel sounds are absent.  Lab Results:  Results for orders placed during the hospital encounter of 10/13/11 (from the past 24 hour(s))  CBC     Status: Abnormal   Collection Time   10/13/11  4:30 PM      Component Value Range   WBC 16.2 (*) 4.0 - 10.5 (K/uL)   RBC 4.70  3.87 - 5.11 (MIL/uL)   Hemoglobin 15.7 (*) 12.0 - 15.0 (g/dL)   HCT 40.9  81.1 - 91.4 (%)   MCV 97.2  78.0 - 100.0 (fL)   MCH 33.4  26.0 - 34.0 (pg)   MCHC 34.4  30.0 - 36.0 (g/dL)   RDW 78.2  95.6 - 21.3 (%)   Platelets 275  150 - 400 (K/uL)  COMPREHENSIVE METABOLIC PANEL     Status: Abnormal   Collection Time   10/13/11  4:30 PM      Component Value Range   Sodium 139  135 - 145 (mEq/L)   Potassium 4.0  3.5 - 5.1 (mEq/L)   Chloride 99  96 - 112 (mEq/L)   CO2 30  19 - 32 (mEq/L)   Glucose, Bld 135 (*) 70 - 99 (mg/dL)   BUN 15  6 - 23 (mg/dL)   Creatinine, Ser 0.86  0.50 - 1.10 (mg/dL)   Calcium 57.8 (*) 8.4 - 10.5 (mg/dL)   Total Protein 8.1  6.0 - 8.3 (g/dL)   Albumin  3.7  3.5 - 5.2 (g/dL)   AST 18  0 - 37 (U/L)   ALT 13  0 - 35 (U/L)   Alkaline Phosphatase 57  39 - 117 (U/L)   Total Bilirubin 0.4  0.3 - 1.2 (mg/dL)   GFR calc non Af Amer 87 (*) >90 (mL/min)   GFR calc Af Amer >90  >90 (mL/min)  URINALYSIS, ROUTINE W REFLEX MICROSCOPIC     Status: Abnormal   Collection Time   10/13/11  8:54 PM      Component Value Range   Color, Urine YELLOW  YELLOW    APPearance CLEAR  CLEAR    Specific Gravity, Urine 1.011  1.005 - 1.030    pH 6.0  5.0 - 8.0    Glucose, UA NEGATIVE  NEGATIVE (mg/dL)   Hgb urine dipstick TRACE (*) NEGATIVE    Bilirubin Urine NEGATIVE  NEGATIVE    Ketones, ur 15 (*) NEGATIVE (mg/dL)   Protein, ur NEGATIVE  NEGATIVE (mg/dL)   Urobilinogen, UA 0.2  0.0 - 1.0 (mg/dL)   Nitrite NEGATIVE  NEGATIVE    Leukocytes, UA NEGATIVE  NEGATIVE   URINE MICROSCOPIC-ADD ON     Status: Abnormal   Collection Time   10/13/11  8:54 PM      Component Value Range   Squamous Epithelial / LPF RARE  RARE    WBC, UA 3-6  <3 (WBC/hpf)   RBC / HPF 0-2  <3 (RBC/hpf)   Bacteria, UA MANY (*) RARE    Urine-Other MUCOUS PRESENT    CBC     Status: Abnormal   Collection Time   10/14/11  7:12 AM      Component Value Range   WBC 10.3  4.0 - 10.5 (K/uL)   RBC 3.84 (*) 3.87 - 5.11 (MIL/uL)   Hemoglobin 12.4  12.0 - 15.0 (g/dL)   HCT 16.1  09.6 - 04.5 (%)   MCV 98.2  78.0 - 100.0 (fL)   MCH 32.3  26.0 - 34.0 (pg)   MCHC 32.9  30.0 - 36.0 (g/dL)   RDW 40.9  81.1 - 91.4 (%)   Platelets 223  150 - 400 (K/uL)  BASIC METABOLIC PANEL     Status: Abnormal   Collection Time   10/14/11  7:12 AM      Component Value Range   Sodium 140  135 - 145 (mEq/L)   Potassium 4.1  3.5 - 5.1 (mEq/L)   Chloride 108  96 - 112 (mEq/L)   CO2 27  19 - 32 (mEq/L)   Glucose, Bld 111 (*) 70 - 99 (mg/dL)   BUN 16  6 - 23 (mg/dL)   Creatinine, Ser 7.82  0.50 - 1.10 (mg/dL)   Calcium 8.8  8.4 - 95.6 (mg/dL)   GFR calc non Af Amer >90  >90 (mL/min)   GFR calc Af Amer >90  >90  (mL/min)     Studies/Results: @RISRSLT24 @     . sodium chloride   Intravenous Once  . antiseptic oral rinse  15 mL Mouth Rinse BID  . enoxaparin  40 mg Subcutaneous Q24H  . famotidine (PEPCID) IV  20 mg Intravenous Q12H  . levothyroxine  88 mcg Oral Q0700  . morphine      .  morphine injection  6 mg Intravenous Once  . ondansetron (ZOFRAN) IV  4 mg Intravenous Once  . sodium chloride  1,000 mL Intravenous Once  . white petrolatum         Assessment/Plan:  Postoperative abdominal distention. This may be a bowel obstruction from adhesions, staple line complication, or may simply be a motility disorder.  There is no evidence of bowel compromise or perforation or ischemia.  The small bowel is dilated all the way down to the ileocecal valve, and the colon is collapsed by CT scan.  The plan for the next 24 hours is to continue conservative management with NG suction, bowel rest, and we will repeat lab and x-rays tomorrow.  The patient and her husband are aware that this may or may not require operative intervention, depending on the clinical course.     Patient Active Hospital Problem List: No active hospital problems.   LOS: 1 day    [name] M 10/14/2011  . .prob

## 2011-10-15 ENCOUNTER — Inpatient Hospital Stay (HOSPITAL_COMMUNITY): Payer: Medicare Other

## 2011-10-15 LAB — CBC
HCT: 39.1 % (ref 36.0–46.0)
Platelets: 225 10*3/uL (ref 150–400)
RBC: 4 MIL/uL (ref 3.87–5.11)
RDW: 12.6 % (ref 11.5–15.5)
WBC: 6.6 10*3/uL (ref 4.0–10.5)

## 2011-10-15 LAB — BASIC METABOLIC PANEL
CO2: 27 mEq/L (ref 19–32)
Chloride: 108 mEq/L (ref 96–112)
GFR calc Af Amer: >90 mL/min (ref >90)
Potassium: 3.7 mEq/L (ref 3.5–5.1)

## 2011-10-15 LAB — TSH: TSH: 2.737 u[IU]/mL (ref 0.350–4.500)

## 2011-10-15 MED ORDER — ZOLPIDEM TARTRATE 5 MG PO TABS
5.0000 mg | ORAL_TABLET | Freq: Every evening | ORAL | Status: DC | PRN
  Administered 2011-10-15 – 2011-10-18 (×4): 5 mg via ORAL
  Filled 2011-10-15 (×4): qty 1

## 2011-10-15 MED ORDER — ZOLPIDEM TARTRATE 5 MG PO TABS
5.0000 mg | ORAL_TABLET | Freq: Once | ORAL | Status: AC
  Administered 2011-10-15: 5 mg via ORAL
  Filled 2011-10-15: qty 1

## 2011-10-15 MED ORDER — ESCITALOPRAM OXALATE 10 MG PO TABS
10.0000 mg | ORAL_TABLET | Freq: Every day | ORAL | Status: DC
  Administered 2011-10-15 – 2011-10-18 (×4): 10 mg via ORAL
  Filled 2011-10-15 (×5): qty 1

## 2011-10-15 NOTE — Progress Notes (Signed)
POD #4 Subjective: Passing gas and bowels are moving.  Feels a little bloated.  Objective: Vital signs in last 24 hours: Temp:  [97.7 F (36.5 C)-98.9 F (37.2 C)] 98.8 F (37.1 C) (12/24 0532) Pulse Rate:  [72-73] 73  (12/24 0532) Resp:  [18-20] 19  (12/24 0532) BP: (108-122)/(66-74) 118/74 mmHg (12/24 0532) SpO2:  [96 %-97 %] 96 % (12/24 0532) Last BM Date: 10/15/11  Intake/Output from previous day: 12/23 0701 - 12/24 0700 In: 3188.7 [I.V.:3066.2; NG/GT:70; IV Piggyback:52.5] Out: 2101 [Urine:1925; Emesis/NG output:175; Stool:1] Intake/Output this shift: Total I/O In: -  Out: 1 [Stool:1]  PE:   Lab Results:   Basename 10/15/11 0414 10/14/11 0712  WBC 6.6 10.3  HGB 12.9 12.4  HCT 39.1 37.7  PLT 225 223   BMET  Basename 10/15/11 0414 10/14/11 0712  NA 140 140  K 3.7 4.1  CL 108 108  CO2 27 27  GLUCOSE 116* 111*  BUN 6 16  CREATININE 0.61 0.64  CALCIUM 8.7 8.8   PT/INR No results found for this basename: LABPROT:2,INR:2 in the last 72 hours Comprehensive Metabolic Panel:    Component Value Date/Time   NA 140 10/15/2011 0414   K 3.7 10/15/2011 0414   CL 108 10/15/2011 0414   CO2 27 10/15/2011 0414   BUN 6 10/15/2011 0414   CREATININE 0.61 10/15/2011 0414   GLUCOSE 116* 10/15/2011 0414   CALCIUM 8.7 10/15/2011 0414   AST 18 10/13/2011 1630   ALT 13 10/13/2011 1630   ALKPHOS 57 10/13/2011 1630   BILITOT 0.4 10/13/2011 1630   PROT 8.1 10/13/2011 1630   ALBUMIN 3.7 10/13/2011 1630     Studies/Results: Ct Abdomen Pelvis W Contrast  10/13/2011  *RADIOLOGY REPORT*  Clinical Data: Right-sided abdominal pain.  The patient is postop day 08/02 from appendectomy.  Worsening abdominal pain with nausea vomiting.  CT ABDOMEN AND PELVIS WITH CONTRAST  Technique:  Multidetector CT imaging of the abdomen and pelvis was performed following the standard protocol during bolus administration of intravenous contrast.  Contrast: OMNIPAQUE IOHEXOL 300 MG/ML IV  SOLN  Comparison: 08/31/2011  Findings: Multiple well-defined low density lesions in the liver are stable, likely reflects cysts.  The spleen is normal.  The stomach and is markedly distended.  The duodenum is distended. Pancreas, gallbladder, and adrenal glands are unremarkable.  7 mm low density lesion in the right kidney has progressed in the interval.  This has attenuation too high allow classification as a simple cyst.  No abdominal aortic aneurysm.  There is a small amount of fluid adjacent to the liver.  Intraperitoneal free air is noted.  There is diffuse dilatation of small bowel.  Small bowel loops in the left abdomen measure up to 4.6 cm in diameter.  There is interloop mesenteric fluid in multiple locations.  The fluid filled, dilated small bowel remains distended all the way through to the level of the anastomosis.  The colon is decompressed without fluid or gas.  Imaging through the pelvis shows a distended urinary bladder. 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  There is no adnexal mass.  Bone windows reveal no worrisome lytic or sclerotic osseous lesions.  IMPRESSION: Diffuse fluid filled, dilated small bowel wall way through to the ileocolic anastomosis.  The colon is completely decompressed without air or fluid in the lumen.  Imaging features are compatible with mechanical obstruction at the level of the anastomoses.  There is a small amount of free intraperitoneal fluid with scattered  areas of interloop mesenteric fluid as well.  Intraperitoneal free air.  This is not unexpected 2 days out from surgery.  7 mm low density lesion in the right kidney has progressed since the CT scan about a month ago.  This has attenuation too high to allow classification as a simple cyst.  It may be a cyst complicated by proteinaceous debris or hemorrhage, but follow-up dedicated renal MRI is recommended to confirm.  I personally called these results to Dr. Patria Mane at 2049 hours on 10/13/2011.  Original  Report Authenticated By: ERIC A. MANSELL, M.D.   Dg Abd 2 Views  10/15/2011  *RADIOLOGY REPORT*  Clinical Data: Small bowel obstruction, postop  ABDOMEN - 2 VIEW  Comparison: CT 10/13/2011  Findings: Nasogastric tube extends to the decompressed stomach. There is a small amount of free air under the right diaphragmatic leaflet presumably postoperative.  There is a loop of dilated small bowel in the left upper abdomen and several gas dilated small bowel loops in the mid abdomen, with scattered fluid levels on the erect radiograph.  The colon is decompressed   containing small amount of residual oral contrast material.  Visualized lung bases clear.  Regional bones   unremarkable.  IMPRESSION:  1.  Persistent small bowel ileus or obstruction. 2. Intraperitoneal free air, presumably postoperative.  Original Report Authenticated By: Osa Craver, M.D.   Dg Abd Acute W/chest  10/13/2011  *RADIOLOGY REPORT*  Clinical Data: Abdominal pain, nausea.  Recent partial colectomy.  ACUTE ABDOMEN SERIES (ABDOMEN 2 VIEW & CHEST 1 VIEW)  Comparison: CT 08/31/2011  Findings: There is free air under the hemidiaphragms, likely related to recent abdominal surgery.  There is a nonobstructive bowel gas pattern.  No organomegaly or suspicious calcification.  Heart and mediastinal contours are within normal limits.  No focal opacities or effusions.  No acute bony abnormality.  IMPRESSION: Free intraperitoneal air, presumably related to recent abdominal surgery.  Original Report Authenticated By: Cyndie Chime, M.D.    Anti-infectives: Anti-infectives     Start     Dose/Rate Route Frequency Ordered Stop   10/14/11 1400   hydroxychloroquine (PLAQUENIL) tablet 200 mg        200 mg Oral 2 times daily 10/14/11 1240            Assessment Active Problems: Postop ileus vs partial sbo- CT contrast is into colon; some clinical improvement.    LOS: 2 days   Plan: Clamp ng tube. Repeat x-rays  12/25.   Bralon Antkowiak J 10/15/2011

## 2011-10-16 ENCOUNTER — Inpatient Hospital Stay (HOSPITAL_COMMUNITY): Payer: Medicare Other

## 2011-10-16 DIAGNOSIS — K56609 Unspecified intestinal obstruction, unspecified as to partial versus complete obstruction: Secondary | ICD-10-CM | POA: Diagnosis present

## 2011-10-16 MED ORDER — VITAMINS A & D EX OINT
TOPICAL_OINTMENT | CUTANEOUS | Status: AC
  Administered 2011-10-16: 13:00:00
  Filled 2011-10-16: qty 5

## 2011-10-16 NOTE — Progress Notes (Signed)
POD#5 Subjective: No n/v.  Passing some gas.  Having loose BMs.  Objective: Vital signs in last 24 hours: Temp:  [97.2 F (36.2 C)-97.9 F (36.6 C)] 97.2 F (36.2 C) (12/25 0704) Pulse Rate:  [82-100] 100  (12/25 0704) Resp:  [18-20] 18  (12/25 0704) BP: (107-109)/(57-66) 107/57 mmHg (12/25 0704) SpO2:  [99 %-100 %] 100 % (12/25 0704) Last BM Date: 10/16/11  Intake/Output from previous day: Oct 24, 2023 0701 - 12/25 0700 In: 956.9 [I.V.:956.9] Out: 1653 [Urine:1650; Stool:3] Intake/Output this shift:    PE: Abd-soft, less distended, incisions c/d/i, few BS  Lab Results:   [facility] 10-24-11 0414 10/14/11 0712  WBC 6.6 10.3  HGB 12.9 12.4  HCT 39.1 37.7  PLT 225 223   BMET  Basename October 24, 2011 0414 10/14/11 0712  NA 140 140  K 3.7 4.1  CL 108 108  CO2 27 27  GLUCOSE 116* 111*  BUN 6 16  CREATININE 0.61 0.64  CALCIUM 8.7 8.8   PT/INR No results found for this basename: LABPROT:2,INR:2 in the last 72 hours Comprehensive Metabolic Panel:    Component Value Date/Time   NA 140 10/24/2011 0414   K 3.7 10-24-2011 0414   CL 108 24-Oct-2011 0414   CO2 27 10/24/2011 0414   BUN 6 10-24-11 0414   CREATININE 0.61 2011-10-24 0414   GLUCOSE 116* 24-Oct-2011 0414   CALCIUM 8.7 10/24/2011 0414   AST 18 10/13/2011 1630   ALT 13 10/13/2011 1630   ALKPHOS 57 10/13/2011 1630   BILITOT 0.4 10/13/2011 1630   PROT 8.1 10/13/2011 1630   ALBUMIN 3.7 10/13/2011 1630     Studies/Results: Dg Abd 2 Views  24-Oct-2011  *RADIOLOGY REPORT*  Clinical Data: Small bowel obstruction, postop  ABDOMEN - 2 VIEW  Comparison: CT 10/13/2011  Findings: Nasogastric tube extends to the decompressed stomach. There is a small amount of free air under the right diaphragmatic leaflet presumably postoperative.  There is a loop of dilated small bowel in the left upper abdomen and several gas dilated small bowel loops in the mid abdomen, with scattered fluid levels on the erect radiograph.  The colon is  decompressed   containing small amount of residual oral contrast material.  Visualized lung bases clear.  Regional bones   unremarkable.  IMPRESSION:  1.  Persistent small bowel ileus or obstruction. 2. Intraperitoneal free air, presumably postoperative.  Original Report Authenticated By: Osa Craver, M.D.    Anti-infectives: Anti-infectives     Start     Dose/Rate Route Frequency Ordered Stop   10/14/11 1400   hydroxychloroquine (PLAQUENIL) tablet 200 mg        200 mg Oral 2 times daily 10/14/11 1240            Assessment Active Problems:  SBO (small bowel obstruction), partial postop vs ileus-clinically improved; x-rays pending.    LOS: 3 days   Plan: If x-rays do not look worse today, will d/c ng tube and start a clear liquid diet.   Kamila Broda J 10/16/2011

## 2011-10-17 ENCOUNTER — Encounter (HOSPITAL_COMMUNITY): Payer: Self-pay | Admitting: Surgery

## 2011-10-17 MED ORDER — POLYVINYL ALCOHOL 1.4 % OP SOLN
1.0000 [drp] | OPHTHALMIC | Status: DC | PRN
  Administered 2011-10-17: 1 [drp] via OPHTHALMIC
  Filled 2011-10-17: qty 15

## 2011-10-17 NOTE — Progress Notes (Signed)
  Subjective: Feels better today. Has tolearted n/g out since yesterday and took CL diet without nausea. Passing gas, mimimal pain  Objective: Vital signs in last 24 hours: Temp:  [97.1 F (36.2 C)-97.9 F (36.6 C)] 97.1 F (36.2 C) (12/26 0547) Pulse Rate:  [75-76] 76  (12/26 0547) Resp:  [16-18] 16  (12/26 0547) BP: (101-114)/(64-70) 101/70 mmHg (12/26 0547) SpO2:  [98 %-100 %] 98 % (12/26 0547) Last BM Date: November 13, 2011  Intake/Output from previous day: 13-Nov-2023 0701 - 12/26 0700 In: 3000 [I.V.:3000] Out: 1750 [Urine:1750] Intake/Output this shift:    General appearance: alert, cooperative and no distress Resp: clear to auscultation bilaterally Cardio: regular rate and rhythm, S1, S2 normal, no murmur, click, rub or gallop GI: Abdomen is soft and not tender. BS present. Not distended  Lab Results:   [facility] 10/15/11 0414  WBC 6.6  HGB 12.9  HCT 39.1  PLT 225   BMET  Basename 10/15/11 0414  NA 140  K 3.7  CL 108  CO2 27  GLUCOSE 116*  BUN 6  CREATININE 0.61  CALCIUM 8.7   PT/INR No results found for this basename: LABPROT:2,INR:2 in the last 72 hours ABG No results found for this basename: PHART:2,PCO2:2,PO2:2,HCO3:2 in the last 72 hours  Studies/Results: Dg Abd 2 Views  11-13-2011  *RADIOLOGY REPORT*  Clinical Data: Partial small bowel obstruction, improvement in abdominal pain, residual nausea  ABDOMEN - 2 VIEW  Comparison: 02/13/2011  Findings: NG tube remains within the decompressed stomach.  There is less free air beneath the right hemidiaphragm. Similar degree of small bowel dilatation with associated air fluid levels on the upright exam.  Very minimal interval change.  There is now air present in the transverse colon.  Visualized lung bases clear. Normal heart size.  No acute osseous finding.  IMPRESSION: Stable residual gaseous distention predominately small bowel compatible with partial obstruction versus ileus.  Small amount of residual  pneumoperitoneum.  Original Report Authenticated By: Judie Petit. Ruel Favors, M.D.    Anti-infectives: Anti-infectives     Start     Dose/Rate Route Frequency Ordered Stop   10/14/11 1400   hydroxychloroquine (PLAQUENIL) tablet 200 mg        200 mg Oral 2 times daily 10/14/11 1240            Assessment/Plan: s/p  Advance diet Will go to full liquids today and slow IVF. Maybe home 1-2 days if contnues to progress  LOS: 4 days    Tamsen Reist J 10/17/2011 2

## 2011-10-18 ENCOUNTER — Telehealth (INDEPENDENT_AMBULATORY_CARE_PROVIDER_SITE_OTHER): Payer: Self-pay | Admitting: General Surgery

## 2011-10-18 MED ORDER — FLORA-Q PO CAPS
1.0000 | ORAL_CAPSULE | Freq: Every day | ORAL | Status: DC
  Administered 2011-10-18: 1 via ORAL
  Filled 2011-10-18 (×2): qty 1

## 2011-10-18 MED ORDER — PSYLLIUM 95 % PO PACK
1.0000 | PACK | Freq: Two times a day (BID) | ORAL | Status: DC
  Administered 2011-10-18 (×2): 1 via ORAL
  Filled 2011-10-18 (×4): qty 1

## 2011-10-18 MED ORDER — ALUM & MAG HYDROXIDE-SIMETH 200-200-20 MG/5ML PO SUSP: 30.0000 mL | Freq: Four times a day (QID) | ORAL | Status: DC | PRN

## 2011-10-18 MED ORDER — SODIUM CHLORIDE 0.9 % IJ SOLN
3.0000 mL | Freq: Two times a day (BID) | INTRAMUSCULAR | Status: DC
  Administered 2011-10-18 (×2): 3 mL via INTRAVENOUS

## 2011-10-18 MED ORDER — SODIUM CHLORIDE 0.9 % IJ SOLN: 3.0000 mL | INTRAMUSCULAR | Status: DC | PRN

## 2011-10-18 MED ORDER — LACTATED RINGERS IV BOLUS (SEPSIS): 1000.0000 mL | Freq: Three times a day (TID) | INTRAVENOUS | Status: DC | PRN

## 2011-10-18 NOTE — Progress Notes (Signed)
[name] 01/15/1946 [id number]  PCP: Gweneth Dimitri, MD, MD  Outpatient Care Team: Patient Care Team: Gweneth Dimitri, MD as PCP - General (Family Medicine) Rob Bunting, MD (Gastroenterology)  Inpatient Treatment Team: Treatment Team: Attending Provider: Ardeth Sportsman, MD; Technician: Gwynneth Albright, NT; Registered Nurse: Armanda Heritage, RN; Technician: Lemar Livings, NT; Registered Nurse: Elease Etienne, RN   LOS: 5 days        Subjective:  Feeling better Tolerated fuller liquids but did have crampy pain after eating yesterday. Walking a fair amount in hallways  Objective:  Vital signs:  Temp:  [97.4 F (36.3 C)-98.3 F (36.8 C)] 97.4 F (36.3 C) (12/27 0615) Pulse Rate:  [63-72] 63  (12/27 0615) Resp:  [16-18] 16  (12/27 0615) BP: (109-112)/(69-72) 112/69 mmHg (12/27 0615) SpO2:  [97 %-99 %] 97 % (12/27 0615) Last BM Date: 10/17/11  Intake/Output    from previous day: 12/26 0701 - 12/27 0700 In: 4638.8 [P.O.:960; I.V.:3378.8; IV Piggyback:300] Out: 901 [Urine:900; Stool:1]  this shift:    Flatus: yes BM: not yesterday  Physical Exam:  General: Pt awake/alert/oriented x4 in no acute distress Eyes: PERRL, normal EOM.  Sclera clear.  No icterus Neuro: CN II-XII intact w/o focal sensory/motor deficits. Lymph: No head/neck/groin lymphadenopathy Psych:  No delerium/psychosis/paranoia HENT: Normocephalic, Mucus membranes moist.  No thrush Neck: Supple, No tracheal deviation Chest: Clear No chest wall pain w good excursion CV:  Pulses intact.  Regular rhythm Abdomen: Soft, Nontender, Mildly distended.  No incarcerated hernias. Ext:  SCDs BLE.  No mjr edema.  No cyanosis Skin: No petechiae / purpurae  Results:   Labs: No results found for this or any previous visit (from the past 48 hour(s)).  Imaging / Studies: No results found.  Antibiotics: Anti-infectives     Start     Dose/Rate Route Frequency Ordered Stop   10/14/11 1400   hydroxychloroquine (PLAQUENIL) tablet 200 mg        200 mg Oral 2 times daily 10/14/11 1240            Medications / Allergies: per chart  Assessment / Plan: [name]  65 y.o. 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 Christella Hartigan (prob one year) -try solid food -bowel regimen Switch to PO meds -VTE prophylaxis- SCDs, etc -mobilize as tolerated to help recovery -probable D/C tomorrow if continues to improve   Lorenso Courier, M.D., F.A.C.S. Gastrointestinal and Minimally Invasive Surgery [facility] [address], [address] [place], [place] [address] [phone] Main / Paging [phone] Voice Mail   10/18/2011

## 2011-10-18 NOTE — Telephone Encounter (Signed)
Pt is aware of Path result

## 2011-10-19 NOTE — Discharge Summary (Signed)
Physician Discharge Summary  Patient ID: [name] MRN: [id number] DOB/AGE: 03/09/1946 65 y.o.  Admit date: 10/13/2011 Discharge date: 10/19/2011  Admission Diagnoses:  Discharge Diagnoses:  Principal Problem:  *SBO (small bowel obstruction), partial postop Active Problems:  Serrated adenoma of appendiceal oriface   Discharged Condition: good  Hospital Course: The patient was admitted for probable SBO.  NGT was placed.  She quickly began to have flatus & BMs.  NGT was removed.  She mobilized well.  She gradually advanced on her diet.  By the time of D/C, she was walking well in hallways, having flatus with BMs, distention of her abdomen was nearly resolved, and she was tolerating solid food.  Based on improvements, I thought it was reasonable to be D/C'd home with close outpatient follow-up.  Consults: none  Significant Diagnostic Studies: radiology: CT scan: PSBO / ileus near ileocecal junction on admission  Treatments: IV hydration  Discharge Exam: Blood pressure 111/74, pulse 83, temperature 96.6 F (35.9 C), temperature source Oral, resp. rate 16, height 5\' 7"  (1.702 m), weight 152 lb (68.947 kg), SpO2 98.00%.  General: Pt awake/alert/oriented x4 in no major acute distress Eyes: PERRL, normal EOM. Neuro: CN II-XII intact w/o focal sensory/motor deficits. Lymph: No head/neck/groin lymphadenopathy Psych:  No delerium/psychosis/paranoia HEENT: Normocephalic, Mucus membranes moist.  No thrush Neck: Supple, No tracheal deviation Chest: No pain w good excursion CV:  Pulses intact.  Regular rhythm Abdomen: soft, nontender/ only mildly distended - nearly normal.  No incarcerated hernias.  Incisions well-closed Ext:  SCDs BLE.  No mjr edema.  No cyanosis Skin: No petechiae / purpurae   Disposition: Home or Self Care  Discharge Orders    Future Appointments: Provider: Department: Dept Phone: Center:   10/25/2011 1:30 PM Ardeth Sportsman, MD [facility] [phone]  None     Future Orders Please Complete By Expires   Diet - low sodium heart healthy      Increase activity slowly      Call MD for:  persistant nausea and vomiting      Call MD for:  severe uncontrolled pain      Call MD for:  redness, tenderness, or signs of infection (pain, swelling, redness, odor or green/yellow discharge around incision site)      Call MD for:  difficulty breathing, headache or visual disturbances      Call MD for:  hives      Call MD for:  persistant dizziness or light-headedness      Call MD for:  extreme fatigue      Call MD for:      Scheduling Instructions:   Temperature > 101 F     Medication List  As of 10/19/2011  7:16 AM   CONTINUE taking these medications         aspirin 81 MG tablet      BL MAGNESIUM PO      calcium carbonate 600 MG Tabs   Commonly known as: OS-CAL      cholecalciferol 1000 UNITS tablet   Commonly known as: VITAMIN D      CO Q 10 PO      desonide 0.05 % ointment   Commonly known as: DESOWEN      escitalopram 10 MG tablet   Commonly known as: LEXAPRO      fexofenadine 180 MG tablet   Commonly known as: ALLEGRA      fish oil-omega-3 fatty acids 1000 MG capsule      folic acid  1 MG tablet   Commonly known as: FOLVITE      GLUCOSAMINE CHONDR 1500 COMPLX PO      hydroxychloroquine 200 MG tablet   Commonly known as: PLAQUENIL      levothyroxine 88 MCG tablet   Commonly known as: SYNTHROID, LEVOTHROID      Manganese 50 MG Tabs      meloxicam 15 MG tablet   Commonly known as: MOBIC      multivitamin tablet      oxyCODONE 5 MG immediate release tablet   Commonly known as: Oxy IR/ROXICODONE   Take 1-2 tablets (5-10 mg total) by mouth every 4 (four) hours as needed for pain.      SYSTANE ULTRA 0.4-0.3 % Soln   Generic drug: Polyethyl Glycol-Propyl Glycol      thiamine 100 MG tablet   Commonly known as: VITAMIN B-1      zolpidem 10 MG tablet   Commonly known as: AMBIEN           Follow-up Information      Follow up with Ulis Kaps C., MD in 1 week.   Contact information:   [facility] [address] [place] [address] [phone]          Signed: Ardeth Sportsman. 10/19/2011, 7:08 AM

## 2011-10-19 NOTE — Progress Notes (Signed)
[name] Som 1946-03-21 [id number]  PCP: Gweneth Dimitri, MD, MD  Outpatient Care Team: Patient Care Team: Gweneth Dimitri, MD as PCP - General (Family Medicine) Rob Bunting, MD (Gastroenterology)  Inpatient Treatment Team: Treatment Team: Attending Provider: Ardeth Sportsman, MD; Technician: Gwynneth Albright, NT; Registered Nurse: Armanda Heritage, RN; Respiratory Therapist: Carney Harder, RRT; Registered Nurse: Elease Etienne, RN   LOS: 6 days       Subjective: No major events Feeling better Tolerating solid food  Very mild crampy pain after eating yesterday AM, but not in last ~18hours Walking a fair amount in hallways  Objective:  Vital signs:  Temp:  [96.6 F (35.9 C)-97.5 F (36.4 C)] 96.6 F (35.9 C) (12/28 0612) Pulse Rate:  [68-83] 83  (12/28 0612) Resp:  [16] 16  (12/28 0612) BP: (105-122)/(57-77) 111/74 mmHg (12/28 0612) SpO2:  [97 %-100 %] 98 % (12/28 0612) Last BM Date: 10/18/11  Intake/Output    from previous day: 12/27 0701 - 12/28 0700 In: 540 [P.O.:540] Out: 1801 [Urine:1800; Stool:1]  this shift: Total I/O In: -  Out: 1801 [Urine:1800; Stool:1]  Flatus: Large volume yesterday, more normal today BM: Loose but better formed yesterday  Physical Exam:  General: Pt awake/alert/oriented x4 in no acute distress.  Smiling, calm Eyes: PERRL, normal EOM.  Sclera clear.  No icterus Neuro: CN II-XII intact w/o focal sensory/motor deficits. Lymph: No head/neck/groin lymphadenopathy Psych:  No delerium/psychosis/paranoia HENT: Normocephalic, Mucus membranes moist.  No thrush Neck: Supple, No tracheal deviation Chest: Clear No chest wall pain w good excursion CV:  Pulses intact.  Regular rhythm Abdomen: Soft, Nontender, Only mildly distended, decreased overall.  No incarcerated hernias. Ext:  SCDs BLE.  No mjr edema.  No cyanosis Skin: No petechiae / purpurae  Results:   Labs: No results found for this or any previous visit (from  the past 48 hour(s)).  Imaging / Studies: No results found.  Antibiotics: Anti-infectives     Start     Dose/Rate Route Frequency Ordered Stop   10/14/11 1400   hydroxychloroquine (PLAQUENIL) tablet 200 mg        200 mg Oral 2 times daily 10/14/11 1240            Medications / Allergies: per chart  Assessment / Plan: [name]  65 y.o. 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 Christella Hartigan (?prob one year) -bowel regimen -VTE prophylaxis- SCDs, etc -mobilize as tolerated to help recovery    [name], M.D., F.A.C.S. Gastrointestinal and Minimally Invasive Surgery [facility] [address] [place], [place] [address] [phone] Main / Paging [phone] Voice Mail   10/19/2011

## 2011-10-19 NOTE — Progress Notes (Signed)
Patient and her husband given discharge instructions and they verbalized understanding.  Patient stable for discharge home.  Patient has follow up appointment in place with Dr. Michaell Cowing.  Patient understands when to notify MD.  Elease Etienne  10/19/2011  9:46 AM

## 2011-10-25 ENCOUNTER — Ambulatory Visit (INDEPENDENT_AMBULATORY_CARE_PROVIDER_SITE_OTHER): Payer: Medicare Other | Admitting: Surgery

## 2011-10-25 ENCOUNTER — Encounter (INDEPENDENT_AMBULATORY_CARE_PROVIDER_SITE_OTHER): Payer: Self-pay | Admitting: Surgery

## 2011-10-25 VITALS — BP 112/68 | HR 76 | Temp 97.8°F | Resp 12 | Ht 67.0 in | Wt 144.8 lb

## 2011-10-25 DIAGNOSIS — D126 Benign neoplasm of colon, unspecified: Secondary | ICD-10-CM

## 2011-10-25 DIAGNOSIS — K56609 Unspecified intestinal obstruction, unspecified as to partial versus complete obstruction: Secondary | ICD-10-CM

## 2011-10-25 NOTE — Patient Instructions (Signed)

## 2011-10-25 NOTE — Progress Notes (Signed)
Subjective:     Patient ID: [name], female   DOB: Jun 11, 1946, 66 y.o.   MRN: [id number]  HPI  IMAGINE NEST  1946/08/06 [id number]  Patient Care Team: Gweneth Dimitri, MD as PCP - General (Family Medicine) Rob Bunting, MD (Gastroenterology)  This patient is a 66 y.o.female who presents today for surgical evaluation.   Procedure: Lap-assisted partial cecotomy and appendectomy 10/11/2011  Pathology: serrated adenoma near appendiceal orifice. Cecal wall lipoma  The patient comes in today feeling a little better. She returned to the hospital with obstructive-like symptoms. She improved with conservative measures. She's been home for about a week. She has been using a chart to record intake and output very strictly.  She had loose bowel movements 4-5 /day last week. They are now 2-3 and more solid. She's been sticking to a bland, soft diet. She is hesitant to advance her diet. She is having flatus.   She is walking more. No nausea or vomiting. She can stay with her husband. She can sit back in her regular clothes and does not from feel particularly bloated. She does get some mild cramping and discomfort with eating well. Some gurgling is noticeable.  Overall she does feel like things are calming down.    Patient Active Problem List  Diagnoses  . 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 Serrated adenoma of appendiceal oriface  . SBO (small bowel obstruction), partial postop    Past Medical History  Diagnosis Date  . Systemic lupus   . Raynauds phenomenon   . Sjogren's syndrome   . Osteoarthritis   . Hypothyroid   . Environmental allergies   . Osteoarthritis   . Osteoarthritis   . Osteopenia   . Colon polyp   . Allergy     Past Surgical History  Procedure Date  . Bunionectomy 2008    right foot  . Endometrial ablation   . Colonoscopy w/ polypectomy Aug 27, 2011  . Appendectomy   . Laparoscopic appendectomy 10/11/2011    Procedure: APPENDECTOMY LAPAROSCOPIC;  Surgeon:  Ardeth Sportsman, MD;  Location: [facility];  Service: General;  Laterality: N/A;  Partial Cecectomy and Appendectomy    History   Social History  . Marital Status: Married    Spouse Name: N/A    Number of Children: N/A  . Years of Education: N/A   Occupational History  . Not on file.   Social History Main Topics  . Smoking status: Never Smoker   . Smokeless tobacco: Never Used  . Alcohol Use: No  . Drug Use: No  . Sexually Active: Yes    Birth Control/ Protection: Post-menopausal   Other Topics Concern  . Not on file   Social History Narrative  . No narrative on file    Family History  Problem Relation Age of Onset  . Heart disease Mother   . Heart disease Brother   . Diabetes Maternal Grandfather   . Cancer Maternal Uncle     colon    Current outpatient prescriptions:aspirin 81 MG tablet, Take 81 mg by mouth at bedtime. , Disp: , Rfl: ;  BL MAGNESIUM PO, Take 1,000 mg by mouth 3 (three) times daily. , Disp: , Rfl: ;  calcium carbonate (OS-CAL) 600 MG TABS, Take 600 mg by mouth daily. , Disp: , Rfl: ;  cholecalciferol (VITAMIN D) 1000 UNITS tablet, Take 2,000 Units by mouth daily. , Disp: , Rfl:  Coenzyme Q10 (CO Q 10 PO), Take 200 mg by mouth 2 times  daily at 12 noon and 4 pm. , Disp: , Rfl: ;  escitalopram (LEXAPRO) 10 MG tablet, Take 10 mg by mouth every morning. , Disp: , Rfl: ;  fexofenadine (ALLEGRA) 180 MG tablet, Take 180 mg by mouth daily. , Disp: , Rfl: ;  fish oil-omega-3 fatty acids 1000 MG capsule, Take 2 g by mouth daily. , Disp: , Rfl: ;  folic acid (FOLVITE) 1 MG tablet, Take 1 mg by mouth daily. , Disp: , Rfl:  Glucosamine-Chondroit-Vit C-Mn (GLUCOSAMINE CHONDR 1500 COMPLX PO), Take 1,500 mg by mouth 2 (two) times daily at 10 AM and 5 PM. , Disp: , Rfl: ;  hydroxychloroquine (PLAQUENIL) 200 MG tablet, Take 200 mg by mouth 2 (two) times daily. , Disp: , Rfl: ;  levothyroxine (SYNTHROID, LEVOTHROID) 88 MCG tablet, Take 88 mcg by mouth every morning. , Disp: , Rfl: ;   Manganese 50 MG TABS, Take 50 mg by mouth daily. , Disp: , Rfl:  meloxicam (MOBIC) 15 MG tablet, Take 15 mg by mouth daily. , Disp: , Rfl: ;  Multiple Vitamin (MULTIVITAMIN) tablet, Take 1 tablet by mouth daily. , Disp: , Rfl: ;  Polyethyl Glycol-Propyl Glycol (SYSTANE ULTRA) 0.4-0.3 % SOLN, Apply 1 drop to eye 2 (two) times daily. , Disp: , Rfl: ;  Thiamine HCl (VITAMIN B-1) 100 MG tablet, Take 100 mg by mouth daily. , Disp: , Rfl:  zolpidem (AMBIEN) 10 MG tablet, Take 5 mg by mouth at bedtime as needed. SLEEP , Disp: , Rfl:   Allergies  Allergen Reactions  . Sulfonamide Derivatives     REACTION: headache    BP 112/68  Pulse 76  Temp(Src) 97.8 F (36.6 C) (Temporal)  Resp 12  Ht 5\' 7"  (1.702 m)  Wt 144 lb 12.8 oz (65.681 kg)  BMI 22.68 kg/m2     Review of Systems  Constitutional: Negative for fever, chills and diaphoresis.  HENT: Negative for ear pain, sore throat and trouble swallowing.   Eyes: Negative for photophobia and visual disturbance.  Respiratory: Negative for cough and choking.   Cardiovascular: Negative for chest pain and palpitations.  Gastrointestinal: Negative for nausea, vomiting, constipation, blood in stool, abdominal distention, anal bleeding and rectal pain.  Genitourinary: Negative for dysuria, frequency and difficulty urinating.  Musculoskeletal: Negative for myalgias and gait problem.  Skin: Negative for color change, pallor and rash.  Neurological: Negative for dizziness, speech difficulty, weakness and numbness.  Hematological: Negative for adenopathy.  Psychiatric/Behavioral: Negative for confusion and agitation. The patient is not nervous/anxious.        Objective:   Physical Exam  Constitutional: She is oriented to person, place, and time. She appears well-developed and well-nourished. No distress.  HENT:  Head: Normocephalic.  Mouth/Throat: Oropharynx is clear and moist. No oropharyngeal exudate.  Eyes: Conjunctivae and EOM are normal. Pupils  are equal, round, and reactive to light. No scleral icterus.  Neck: Normal range of motion. No tracheal deviation present.  Cardiovascular: Normal rate and intact distal pulses.   Pulmonary/Chest: Effort normal. No respiratory distress. She exhibits no tenderness.  Abdominal: Soft. She exhibits no distension. There is no tenderness. Hernia confirmed negative in the right inguinal area and confirmed negative in the left inguinal area.       Incisions clean with normal healing ridges.  No hernias  Genitourinary: No vaginal discharge found.  Musculoskeletal: Normal range of motion. She exhibits no tenderness.  Lymphadenopathy:       Right: No inguinal adenopathy present.  Left: No inguinal adenopathy present.  Neurological: She is alert and oriented to person, place, and time. No cranial nerve deficit. She exhibits normal muscle tone. Coordination normal.  Skin: Skin is warm and dry. No rash noted. She is not diaphoretic.  Psychiatric: She has a normal mood and affect. Her behavior is normal.       Assessment:     2 weeks s/p lap appy/partial cecectomy with post-op ileus/PSBO.  Improving    Plan:     Increase activity as tolerated.  Do not push through pain.  I think she can be a more aggressive in her physical activity.  Get up to walking an hour a day.  Advanced on diet as tolerated. Bowel regimen to avoid problems.  I hope is this is a transient edema at the staple line with a tight ileocecal region. As the edema decreases, it should improve.  Return to clinic 3 weeks. The patient expressed understanding and appreciation

## 2011-11-19 ENCOUNTER — Ambulatory Visit (INDEPENDENT_AMBULATORY_CARE_PROVIDER_SITE_OTHER): Payer: Medicare Other | Admitting: Surgery

## 2011-11-19 ENCOUNTER — Encounter (INDEPENDENT_AMBULATORY_CARE_PROVIDER_SITE_OTHER): Payer: Self-pay | Admitting: Surgery

## 2011-11-19 VITALS — BP 112/74 | HR 72 | Temp 97.7°F | Resp 18 | Ht 67.0 in | Wt 140.6 lb

## 2011-11-19 DIAGNOSIS — K56609 Unspecified intestinal obstruction, unspecified as to partial versus complete obstruction: Secondary | ICD-10-CM

## 2011-11-19 DIAGNOSIS — D126 Benign neoplasm of colon, unspecified: Secondary | ICD-10-CM

## 2011-11-19 NOTE — Progress Notes (Signed)
Subjective:     Patient ID: [name], female   DOB: [birth year], 66 y.o.   MRN: [id number]  HPI   [name]  [birth year] [id number]  Patient Care Team: Gweneth Dimitri, MD as PCP - General (Family Medicine) Rob Bunting, MD (Gastroenterology)  This patient is a 66 y.o.female who presents today for surgical evaluation.   Procedure: Lap-assisted partial cecotomy and appendectomy 10/11/2011  Pathology: serrated adenoma near appendiceal orifice. Cecal wall lipoma  The patient comes in today feeling a little better.   She had loose bowel movements initially 4-5 /day last week. They are now 1-2 and more solid. She's been sticking to a bland, soft diet. She is hesitant to advance her diet. She is having flatus.  She tolerates chicken and pork fine. Most vegetables. However, she had an episode of crampy pain with sauteed spinach. Another one was with a cheeseburger. This concerns her.  She does get some mild cramping and discomfort with eating well. Taking fiber supplement.  Some gurgling is noticeable.  She is walking more. No nausea or vomiting.   She was hoping to be normal by now. She had many questions  Patient Active Problem List  Diagnoses  . 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 Serrated adenoma of appendiceal oriface  . SBO (small bowel obstruction), partial postop    Past Medical History  Diagnosis Date  . Systemic lupus   . Raynauds phenomenon   . Sjogren's syndrome   . Osteoarthritis   . Hypothyroid   . Environmental allergies   . Osteoarthritis   . Osteoarthritis   . Osteopenia   . Colon polyp   . Allergy     Past Surgical History  Procedure Date  . Bunionectomy 2008    right foot  . Endometrial ablation   . Colonoscopy w/ polypectomy Aug 27, 2011  . Appendectomy   . Laparoscopic appendectomy 10/11/2011    Procedure: APPENDECTOMY LAPAROSCOPIC;  Surgeon: Ardeth Sportsman, MD;  Location: [facility];  Service: General;  Laterality: N/A;  Partial Cecectomy and  Appendectomy    History   Social History  . Marital Status: Married    Spouse Name: N/A    Number of Children: N/A  . Years of Education: N/A   Occupational History  . Not on file.   Social History Main Topics  . Smoking status: Never Smoker   . Smokeless tobacco: Never Used  . Alcohol Use: No  . Drug Use: No  . Sexually Active: Yes    Birth Control/ Protection: Post-menopausal   Other Topics Concern  . Not on file   Social History Narrative  . No narrative on file    Family History  Problem Relation Age of Onset  . Heart disease Mother   . Heart disease Brother   . Diabetes Maternal Grandfather   . Cancer Maternal Uncle     colon    Current outpatient prescriptions:aspirin 81 MG tablet, Take 81 mg by mouth at bedtime. , Disp: , Rfl: ;  BL MAGNESIUM PO, Take 1,000 mg by mouth 3 (three) times daily. , Disp: , Rfl: ;  calcium carbonate (OS-CAL) 600 MG TABS, Take 600 mg by mouth daily. , Disp: , Rfl: ;  cholecalciferol (VITAMIN D) 1000 UNITS tablet, Take 2,000 Units by mouth daily. , Disp: , Rfl:  Coenzyme Q10 (CO Q 10 PO), Take 200 mg by mouth 2 times daily at 12 noon and 4 pm. , Disp: , Rfl: ;  escitalopram (LEXAPRO) 10  MG tablet, Take 10 mg by mouth every morning. , Disp: , Rfl: ;  fexofenadine (ALLEGRA) 180 MG tablet, Take 180 mg by mouth daily. , Disp: , Rfl: ;  fish oil-omega-3 fatty acids 1000 MG capsule, Take 2 g by mouth daily. , Disp: , Rfl: ;  folic acid (FOLVITE) 1 MG tablet, Take 1 mg by mouth daily. , Disp: , Rfl:  Glucosamine-Chondroit-Vit C-Mn (GLUCOSAMINE CHONDR 1500 COMPLX PO), Take 1,500 mg by mouth 2 (two) times daily at 10 AM and 5 PM. , Disp: , Rfl: ;  hydroxychloroquine (PLAQUENIL) 200 MG tablet, Take 200 mg by mouth 2 (two) times daily. , Disp: , Rfl: ;  levothyroxine (SYNTHROID, LEVOTHROID) 88 MCG tablet, Take 88 mcg by mouth every morning. , Disp: , Rfl: ;  Manganese 50 MG TABS, Take 50 mg by mouth daily. , Disp: , Rfl:  meloxicam (MOBIC) 15 MG tablet,  Take 15 mg by mouth daily. , Disp: , Rfl: ;  Multiple Vitamin (MULTIVITAMIN) tablet, Take 1 tablet by mouth daily. , Disp: , Rfl: ;  Polyethyl Glycol-Propyl Glycol (SYSTANE ULTRA) 0.4-0.3 % SOLN, Apply 1 drop to eye 2 (two) times daily. , Disp: , Rfl: ;  Thiamine HCl (VITAMIN B-1) 100 MG tablet, Take 100 mg by mouth daily. , Disp: , Rfl:  zolpidem (AMBIEN) 10 MG tablet, Take 5 mg by mouth at bedtime as needed. SLEEP , Disp: , Rfl:   Allergies  Allergen Reactions  . Sulfonamide Derivatives     REACTION: headache    BP 112/74  Pulse 72  Temp(Src) 97.7 F (36.5 C) (Temporal)  Resp 18  Ht 5\' 7"  (1.702 m)  Wt 140 lb 9.6 oz (63.776 kg)  BMI 22.02 kg/m2     Review of Systems  Constitutional: Negative for fever, chills and diaphoresis.  HENT: Negative for ear pain, sore throat and trouble swallowing.   Eyes: Negative for photophobia and visual disturbance.  Respiratory: Negative for cough and choking.   Cardiovascular: Negative for chest pain and palpitations.  Gastrointestinal: Negative for nausea, vomiting, constipation, blood in stool, abdominal distention, anal bleeding and rectal pain.  Genitourinary: Negative for dysuria, frequency and difficulty urinating.  Musculoskeletal: Negative for myalgias and gait problem.  Skin: Negative for color change, pallor and rash.  Neurological: Negative for dizziness, speech difficulty, weakness and numbness.  Hematological: Negative for adenopathy.  Psychiatric/Behavioral: Negative for confusion and agitation. The patient is not nervous/anxious.        Objective:   Physical Exam  Constitutional: She is oriented to person, place, and time. She appears well-developed and well-nourished. No distress.  HENT:  Head: Normocephalic.  Mouth/Throat: Oropharynx is clear and moist. No oropharyngeal exudate.  Eyes: Conjunctivae and EOM are normal. Pupils are equal, round, and reactive to light. No scleral icterus.  Neck: Normal range of motion. No  tracheal deviation present.  Cardiovascular: Normal rate and intact distal pulses.   Pulmonary/Chest: Effort normal. No respiratory distress. She exhibits no tenderness.  Abdominal: Soft. She exhibits no distension. There is no tenderness. Hernia confirmed negative in the right inguinal area and confirmed negative in the left inguinal area.       Incisions clean with normal healing ridges.  No hernias  Genitourinary: No vaginal discharge found.  Musculoskeletal: Normal range of motion. She exhibits no tenderness.  Lymphadenopathy:       Right: No inguinal adenopathy present.       Left: No inguinal adenopathy present.  Neurological: She is alert and oriented to  person, place, and time. No cranial nerve deficit. She exhibits normal muscle tone. Coordination normal.  Skin: Skin is warm and dry. No rash noted. She is not diaphoretic.  Psychiatric: She has a normal mood and affect. Her behavior is normal.       Assessment:     4 weeks s/p lap appy/partial cecectomy with post-op ileus/PSBO.  Improving but slowly    Plan:     Increase activity as tolerated.  Do not push through pain.  Get up to walking an hour a day.  Advanced on diet as tolerated. Stop fiber supplement as that can be causing bloating/cramping.  Switch to flax seed if constipated.  If she was able to eat all these things before, she should eventually get back to eating which he did before surgery. It makes no sense to me that similar textures are tolerated differently. She should have problem with all proteins are all vegetables not just some.  I hope is this is a transient edema at the staple line with a tight ileocecal region. I did note her that he can take several months for the bowels to recalibrate after a bowel obstruction. I would like to give the anastomosis more time to normalize before doing anything to progressive.  As the edema decreases, it should improve.  If there are still symptoms or worsening >3 months,  consider CT enterrhography to r/o stricture at the ileocecal region.  Resection as a last option.    Return to clinic 3-4 weeks. The patient expressed understanding and appreciation

## 2011-11-26 DIAGNOSIS — I781 Nevus, non-neoplastic: Secondary | ICD-10-CM | POA: Diagnosis not present

## 2011-11-26 DIAGNOSIS — L821 Other seborrheic keratosis: Secondary | ICD-10-CM | POA: Diagnosis not present

## 2011-11-26 DIAGNOSIS — L301 Dyshidrosis [pompholyx]: Secondary | ICD-10-CM | POA: Diagnosis not present

## 2011-11-26 DIAGNOSIS — D239 Other benign neoplasm of skin, unspecified: Secondary | ICD-10-CM | POA: Diagnosis not present

## 2011-11-29 DIAGNOSIS — H2510 Age-related nuclear cataract, unspecified eye: Secondary | ICD-10-CM | POA: Diagnosis not present

## 2011-11-29 DIAGNOSIS — Z79899 Other long term (current) drug therapy: Secondary | ICD-10-CM | POA: Diagnosis not present

## 2011-12-04 ENCOUNTER — Telehealth (INDEPENDENT_AMBULATORY_CARE_PROVIDER_SITE_OTHER): Payer: Self-pay | Admitting: Surgery

## 2011-12-04 NOTE — Telephone Encounter (Signed)
LMOM stating that I did receive both messages but I was still waiting to speak to Dr Michaell Cowing about the message. Dr Michaell Cowing is supposed to be here tomorrow for an office and I will call her back.

## 2011-12-05 ENCOUNTER — Telehealth (INDEPENDENT_AMBULATORY_CARE_PROVIDER_SITE_OTHER): Payer: Self-pay

## 2011-12-05 NOTE — Telephone Encounter (Signed)
Called pt to let her know that I did speak with Dr Michaell Cowing about her feeling much better and could she cancel her f/u appt with DR Michaell Cowing. Per DR Michaell Cowing as long as the pt is doing ok I will cancel her appt for 2-25. The pt notified.

## 2011-12-17 ENCOUNTER — Encounter (INDEPENDENT_AMBULATORY_CARE_PROVIDER_SITE_OTHER): Payer: Medicare Other | Admitting: Surgery

## 2011-12-21 DIAGNOSIS — N952 Postmenopausal atrophic vaginitis: Secondary | ICD-10-CM | POA: Diagnosis not present

## 2011-12-21 DIAGNOSIS — M899 Disorder of bone, unspecified: Secondary | ICD-10-CM | POA: Diagnosis not present

## 2011-12-21 DIAGNOSIS — M3500 Sicca syndrome, unspecified: Secondary | ICD-10-CM | POA: Diagnosis not present

## 2011-12-21 DIAGNOSIS — K6389 Other specified diseases of intestine: Secondary | ICD-10-CM | POA: Diagnosis not present

## 2011-12-21 DIAGNOSIS — M949 Disorder of cartilage, unspecified: Secondary | ICD-10-CM | POA: Diagnosis not present

## 2011-12-21 DIAGNOSIS — M329 Systemic lupus erythematosus, unspecified: Secondary | ICD-10-CM | POA: Diagnosis not present

## 2011-12-21 DIAGNOSIS — Z23 Encounter for immunization: Secondary | ICD-10-CM | POA: Diagnosis not present

## 2011-12-21 DIAGNOSIS — E039 Hypothyroidism, unspecified: Secondary | ICD-10-CM | POA: Diagnosis not present

## 2011-12-21 DIAGNOSIS — Z0000 Encounter for general adult medical examination without abnormal findings: Secondary | ICD-10-CM | POA: Diagnosis not present

## 2012-01-08 DIAGNOSIS — M2550 Pain in unspecified joint: Secondary | ICD-10-CM | POA: Diagnosis not present

## 2012-01-08 DIAGNOSIS — Z79899 Other long term (current) drug therapy: Secondary | ICD-10-CM | POA: Diagnosis not present

## 2012-01-08 DIAGNOSIS — R300 Dysuria: Secondary | ICD-10-CM | POA: Diagnosis not present

## 2012-01-14 DIAGNOSIS — M25569 Pain in unspecified knee: Secondary | ICD-10-CM | POA: Diagnosis not present

## 2012-01-14 DIAGNOSIS — M359 Systemic involvement of connective tissue, unspecified: Secondary | ICD-10-CM | POA: Diagnosis not present

## 2012-01-14 DIAGNOSIS — M25519 Pain in unspecified shoulder: Secondary | ICD-10-CM | POA: Diagnosis not present

## 2012-04-01 ENCOUNTER — Other Ambulatory Visit: Payer: Self-pay | Admitting: Family Medicine

## 2012-04-01 DIAGNOSIS — Z1231 Encounter for screening mammogram for malignant neoplasm of breast: Secondary | ICD-10-CM

## 2012-05-12 ENCOUNTER — Ambulatory Visit
Admission: RE | Admit: 2012-05-12 | Discharge: 2012-05-12 | Disposition: A | Discharge status: Home / Self Care | Payer: Medicare Other | Source: Ambulatory Visit | Attending: Family Medicine | Admitting: Family Medicine

## 2012-05-12 DIAGNOSIS — Z1231 Encounter for screening mammogram for malignant neoplasm of breast: Secondary | ICD-10-CM | POA: Diagnosis not present

## 2012-06-11 DIAGNOSIS — R5383 Other fatigue: Secondary | ICD-10-CM | POA: Diagnosis not present

## 2012-06-11 DIAGNOSIS — M2550 Pain in unspecified joint: Secondary | ICD-10-CM | POA: Diagnosis not present

## 2012-06-11 DIAGNOSIS — E559 Vitamin D deficiency, unspecified: Secondary | ICD-10-CM | POA: Diagnosis not present

## 2012-06-11 DIAGNOSIS — Z79899 Other long term (current) drug therapy: Secondary | ICD-10-CM | POA: Diagnosis not present

## 2012-06-11 DIAGNOSIS — R5381 Other malaise: Secondary | ICD-10-CM | POA: Diagnosis not present

## 2012-06-16 DIAGNOSIS — M25569 Pain in unspecified knee: Secondary | ICD-10-CM | POA: Diagnosis not present

## 2012-06-16 DIAGNOSIS — M359 Systemic involvement of connective tissue, unspecified: Secondary | ICD-10-CM | POA: Diagnosis not present

## 2012-06-16 DIAGNOSIS — M25549 Pain in joints of unspecified hand: Secondary | ICD-10-CM | POA: Diagnosis not present

## 2012-06-24 DIAGNOSIS — K6389 Other specified diseases of intestine: Secondary | ICD-10-CM | POA: Diagnosis not present

## 2012-06-24 DIAGNOSIS — E039 Hypothyroidism, unspecified: Secondary | ICD-10-CM | POA: Diagnosis not present

## 2012-06-24 DIAGNOSIS — N952 Postmenopausal atrophic vaginitis: Secondary | ICD-10-CM | POA: Diagnosis not present

## 2012-06-24 DIAGNOSIS — Z23 Encounter for immunization: Secondary | ICD-10-CM | POA: Diagnosis not present

## 2012-06-24 DIAGNOSIS — Z0000 Encounter for general adult medical examination without abnormal findings: Secondary | ICD-10-CM | POA: Diagnosis not present

## 2012-06-24 DIAGNOSIS — M949 Disorder of cartilage, unspecified: Secondary | ICD-10-CM | POA: Diagnosis not present

## 2012-06-24 DIAGNOSIS — M899 Disorder of bone, unspecified: Secondary | ICD-10-CM | POA: Diagnosis not present

## 2012-06-24 DIAGNOSIS — M3500 Sicca syndrome, unspecified: Secondary | ICD-10-CM | POA: Diagnosis not present

## 2012-06-24 DIAGNOSIS — M329 Systemic lupus erythematosus, unspecified: Secondary | ICD-10-CM | POA: Diagnosis not present

## 2012-07-03 DIAGNOSIS — Z23 Encounter for immunization: Secondary | ICD-10-CM | POA: Diagnosis not present

## 2012-09-02 DIAGNOSIS — M1990 Unspecified osteoarthritis, unspecified site: Secondary | ICD-10-CM | POA: Diagnosis not present

## 2012-09-02 DIAGNOSIS — Z0000 Encounter for general adult medical examination without abnormal findings: Secondary | ICD-10-CM | POA: Diagnosis not present

## 2012-09-02 DIAGNOSIS — Z20828 Contact with and (suspected) exposure to other viral communicable diseases: Secondary | ICD-10-CM | POA: Diagnosis not present

## 2012-09-02 DIAGNOSIS — E039 Hypothyroidism, unspecified: Secondary | ICD-10-CM | POA: Diagnosis not present

## 2012-09-02 DIAGNOSIS — Z136 Encounter for screening for cardiovascular disorders: Secondary | ICD-10-CM | POA: Diagnosis not present

## 2012-09-02 DIAGNOSIS — M329 Systemic lupus erythematosus, unspecified: Secondary | ICD-10-CM | POA: Diagnosis not present

## 2012-09-02 DIAGNOSIS — Z01419 Encounter for gynecological examination (general) (routine) without abnormal findings: Secondary | ICD-10-CM | POA: Diagnosis not present

## 2012-09-02 DIAGNOSIS — B351 Tinea unguium: Secondary | ICD-10-CM | POA: Diagnosis not present

## 2012-09-02 DIAGNOSIS — Z131 Encounter for screening for diabetes mellitus: Secondary | ICD-10-CM | POA: Diagnosis not present

## 2012-09-02 LAB — HM HEPATITIS C SCREENING LAB: HM Hepatitis Screen: NEGATIVE

## 2012-09-15 DIAGNOSIS — M1710 Unilateral primary osteoarthritis, unspecified knee: Secondary | ICD-10-CM | POA: Diagnosis not present

## 2012-09-15 DIAGNOSIS — M76899 Other specified enthesopathies of unspecified lower limb, excluding foot: Secondary | ICD-10-CM | POA: Diagnosis not present

## 2012-09-22 DIAGNOSIS — M1710 Unilateral primary osteoarthritis, unspecified knee: Secondary | ICD-10-CM | POA: Diagnosis not present

## 2012-09-29 DIAGNOSIS — M1710 Unilateral primary osteoarthritis, unspecified knee: Secondary | ICD-10-CM | POA: Diagnosis not present

## 2012-11-17 DIAGNOSIS — Z79899 Other long term (current) drug therapy: Secondary | ICD-10-CM | POA: Diagnosis not present

## 2012-11-28 DIAGNOSIS — H2510 Age-related nuclear cataract, unspecified eye: Secondary | ICD-10-CM | POA: Diagnosis not present

## 2012-11-28 DIAGNOSIS — Z79899 Other long term (current) drug therapy: Secondary | ICD-10-CM | POA: Diagnosis not present

## 2012-12-19 DIAGNOSIS — M25539 Pain in unspecified wrist: Secondary | ICD-10-CM | POA: Diagnosis not present

## 2012-12-19 DIAGNOSIS — Z09 Encounter for follow-up examination after completed treatment for conditions other than malignant neoplasm: Secondary | ICD-10-CM | POA: Diagnosis not present

## 2012-12-19 DIAGNOSIS — M76899 Other specified enthesopathies of unspecified lower limb, excluding foot: Secondary | ICD-10-CM | POA: Diagnosis not present

## 2012-12-19 DIAGNOSIS — D899 Disorder involving the immune mechanism, unspecified: Secondary | ICD-10-CM | POA: Diagnosis not present

## 2013-02-02 DIAGNOSIS — Z0000 Encounter for general adult medical examination without abnormal findings: Secondary | ICD-10-CM | POA: Diagnosis not present

## 2013-02-02 DIAGNOSIS — F339 Major depressive disorder, recurrent, unspecified: Secondary | ICD-10-CM | POA: Diagnosis not present

## 2013-02-02 DIAGNOSIS — E039 Hypothyroidism, unspecified: Secondary | ICD-10-CM | POA: Diagnosis not present

## 2013-02-02 DIAGNOSIS — M329 Systemic lupus erythematosus, unspecified: Secondary | ICD-10-CM | POA: Diagnosis not present

## 2013-02-02 DIAGNOSIS — M949 Disorder of cartilage, unspecified: Secondary | ICD-10-CM | POA: Diagnosis not present

## 2013-02-02 DIAGNOSIS — M3500 Sicca syndrome, unspecified: Secondary | ICD-10-CM | POA: Diagnosis not present

## 2013-02-02 DIAGNOSIS — B351 Tinea unguium: Secondary | ICD-10-CM | POA: Diagnosis not present

## 2013-02-02 DIAGNOSIS — M899 Disorder of bone, unspecified: Secondary | ICD-10-CM | POA: Diagnosis not present

## 2013-02-02 DIAGNOSIS — Z20828 Contact with and (suspected) exposure to other viral communicable diseases: Secondary | ICD-10-CM | POA: Diagnosis not present

## 2013-04-08 ENCOUNTER — Other Ambulatory Visit: Payer: Self-pay

## 2013-04-08 DIAGNOSIS — Z1231 Encounter for screening mammogram for malignant neoplasm of breast: Secondary | ICD-10-CM

## 2013-04-15 DIAGNOSIS — Z79899 Other long term (current) drug therapy: Secondary | ICD-10-CM | POA: Diagnosis not present

## 2013-04-20 DIAGNOSIS — M76899 Other specified enthesopathies of unspecified lower limb, excluding foot: Secondary | ICD-10-CM | POA: Diagnosis not present

## 2013-04-20 DIAGNOSIS — M810 Age-related osteoporosis without current pathological fracture: Secondary | ICD-10-CM | POA: Diagnosis not present

## 2013-04-20 DIAGNOSIS — D899 Disorder involving the immune mechanism, unspecified: Secondary | ICD-10-CM | POA: Diagnosis not present

## 2013-04-20 DIAGNOSIS — Z09 Encounter for follow-up examination after completed treatment for conditions other than malignant neoplasm: Secondary | ICD-10-CM | POA: Diagnosis not present

## 2013-04-20 DIAGNOSIS — M1710 Unilateral primary osteoarthritis, unspecified knee: Secondary | ICD-10-CM | POA: Diagnosis not present

## 2013-05-13 ENCOUNTER — Ambulatory Visit: Payer: Medicare Other

## 2013-05-22 ENCOUNTER — Ambulatory Visit
Admission: RE | Admit: 2013-05-22 | Discharge: 2013-05-22 | Disposition: A | Discharge status: Home / Self Care | Payer: Medicare Other | Source: Ambulatory Visit

## 2013-05-22 DIAGNOSIS — Z1231 Encounter for screening mammogram for malignant neoplasm of breast: Secondary | ICD-10-CM

## 2013-07-02 DIAGNOSIS — Z23 Encounter for immunization: Secondary | ICD-10-CM | POA: Diagnosis not present

## 2013-08-31 DIAGNOSIS — Z79899 Other long term (current) drug therapy: Secondary | ICD-10-CM | POA: Diagnosis not present

## 2013-08-31 DIAGNOSIS — F339 Major depressive disorder, recurrent, unspecified: Secondary | ICD-10-CM | POA: Diagnosis not present

## 2013-08-31 DIAGNOSIS — B351 Tinea unguium: Secondary | ICD-10-CM | POA: Diagnosis not present

## 2013-08-31 DIAGNOSIS — Z131 Encounter for screening for diabetes mellitus: Secondary | ICD-10-CM | POA: Diagnosis not present

## 2013-08-31 DIAGNOSIS — M3500 Sicca syndrome, unspecified: Secondary | ICD-10-CM | POA: Diagnosis not present

## 2013-08-31 DIAGNOSIS — M329 Systemic lupus erythematosus, unspecified: Secondary | ICD-10-CM | POA: Diagnosis not present

## 2013-08-31 DIAGNOSIS — E039 Hypothyroidism, unspecified: Secondary | ICD-10-CM | POA: Diagnosis not present

## 2013-08-31 DIAGNOSIS — E559 Vitamin D deficiency, unspecified: Secondary | ICD-10-CM | POA: Diagnosis not present

## 2013-08-31 DIAGNOSIS — M1990 Unspecified osteoarthritis, unspecified site: Secondary | ICD-10-CM | POA: Diagnosis not present

## 2013-08-31 DIAGNOSIS — Z780 Asymptomatic menopausal state: Secondary | ICD-10-CM | POA: Diagnosis not present

## 2013-09-03 ENCOUNTER — Other Ambulatory Visit: Payer: Self-pay | Admitting: Family Medicine

## 2013-09-03 DIAGNOSIS — M3500 Sicca syndrome, unspecified: Secondary | ICD-10-CM | POA: Diagnosis not present

## 2013-09-03 DIAGNOSIS — M899 Disorder of bone, unspecified: Secondary | ICD-10-CM | POA: Diagnosis not present

## 2013-09-03 DIAGNOSIS — M8580 Other specified disorders of bone density and structure, unspecified site: Secondary | ICD-10-CM

## 2013-09-03 DIAGNOSIS — M329 Systemic lupus erythematosus, unspecified: Secondary | ICD-10-CM | POA: Diagnosis not present

## 2013-09-03 DIAGNOSIS — J309 Allergic rhinitis, unspecified: Secondary | ICD-10-CM | POA: Diagnosis not present

## 2013-09-03 DIAGNOSIS — F339 Major depressive disorder, recurrent, unspecified: Secondary | ICD-10-CM | POA: Diagnosis not present

## 2013-09-03 DIAGNOSIS — B351 Tinea unguium: Secondary | ICD-10-CM | POA: Diagnosis not present

## 2013-09-03 DIAGNOSIS — Z0000 Encounter for general adult medical examination without abnormal findings: Secondary | ICD-10-CM | POA: Diagnosis not present

## 2013-09-03 DIAGNOSIS — E039 Hypothyroidism, unspecified: Secondary | ICD-10-CM | POA: Diagnosis not present

## 2013-09-23 DIAGNOSIS — Z09 Encounter for follow-up examination after completed treatment for conditions other than malignant neoplasm: Secondary | ICD-10-CM | POA: Diagnosis not present

## 2013-09-23 DIAGNOSIS — M1710 Unilateral primary osteoarthritis, unspecified knee: Secondary | ICD-10-CM | POA: Diagnosis not present

## 2013-09-23 DIAGNOSIS — M76899 Other specified enthesopathies of unspecified lower limb, excluding foot: Secondary | ICD-10-CM | POA: Diagnosis not present

## 2013-09-23 DIAGNOSIS — D899 Disorder involving the immune mechanism, unspecified: Secondary | ICD-10-CM | POA: Diagnosis not present

## 2013-09-23 DIAGNOSIS — M3500 Sicca syndrome, unspecified: Secondary | ICD-10-CM | POA: Diagnosis not present

## 2013-09-30 DIAGNOSIS — J310 Chronic rhinitis: Secondary | ICD-10-CM | POA: Diagnosis not present

## 2013-09-30 DIAGNOSIS — J342 Deviated nasal septum: Secondary | ICD-10-CM | POA: Diagnosis not present

## 2013-10-07 ENCOUNTER — Ambulatory Visit
Admission: RE | Admit: 2013-10-07 | Discharge: 2013-10-07 | Disposition: A | Discharge status: Home / Self Care | Payer: Self-pay | Source: Ambulatory Visit | Attending: Family Medicine | Admitting: Family Medicine

## 2013-10-07 DIAGNOSIS — M899 Disorder of bone, unspecified: Secondary | ICD-10-CM | POA: Diagnosis not present

## 2013-10-07 DIAGNOSIS — M8580 Other specified disorders of bone density and structure, unspecified site: Secondary | ICD-10-CM

## 2013-10-11 IMAGING — CT CT ABD-PELV W/ CM
2 of 5 series · 17 of 46 positions shown, 19 images · IV contrast (Omnipaque 300)
Comparison: None.

CLINICAL DATA: Polypoid mass at appendiceal orifice on recent
colonoscopy.

CT ABDOMEN AND PELVIS WITH CONTRAST
TECHNIQUE: Multidetector CT imaging of the abdomen and pelvis was
performed following the standard protocol during bolus
administration of intravenous contrast.
Contrast: 100mL OMNIPAQUE IOHEXOL 300 MG/ML IV SOLN

[Series 2: abd/ pel 5mm · axial · 0.63mm/px · z∈[-462,-62]mm · 14 of 90 slices shown, 16 images]
[im 5/90  soft-tissue]
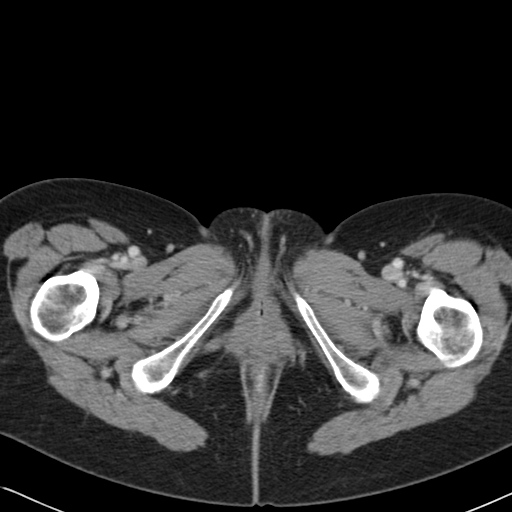
[im 5/90  bone]
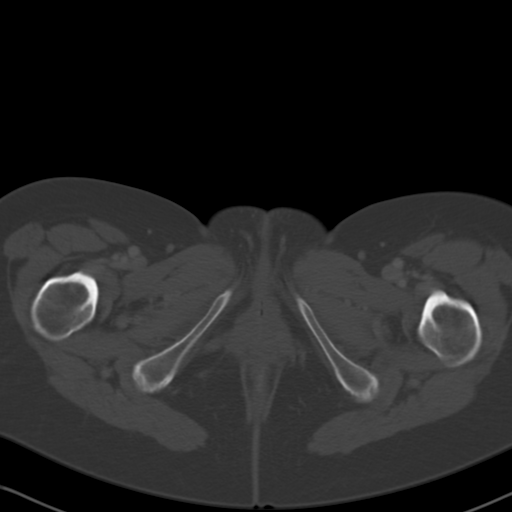
[im 14/90  soft-tissue]
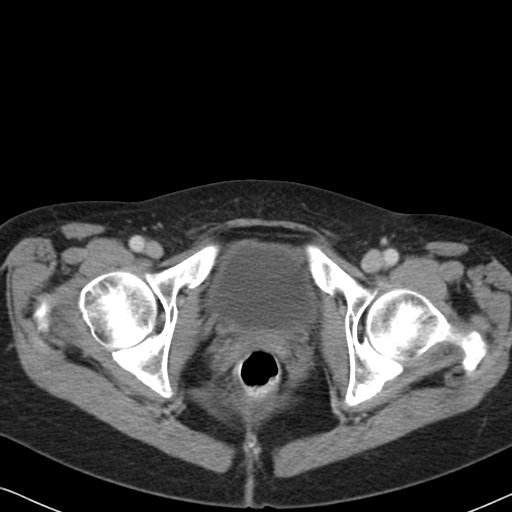
[im 18/90  soft-tissue]
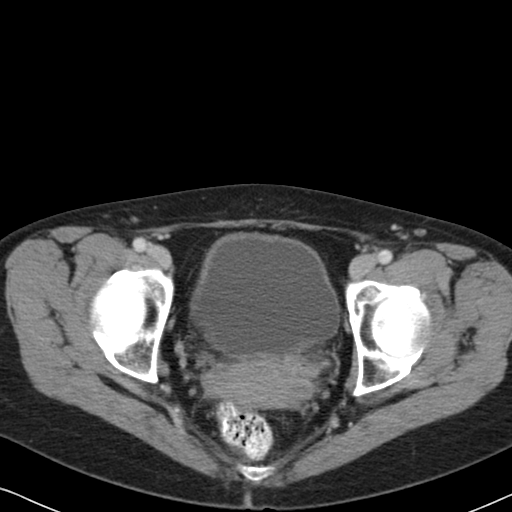
[im 23/90  soft-tissue]
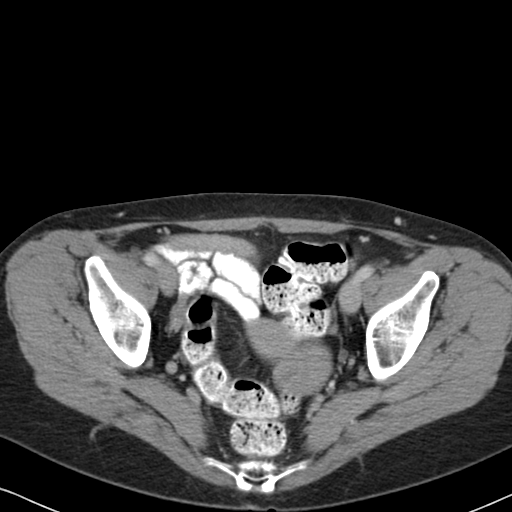
[im 32/90  soft-tissue]
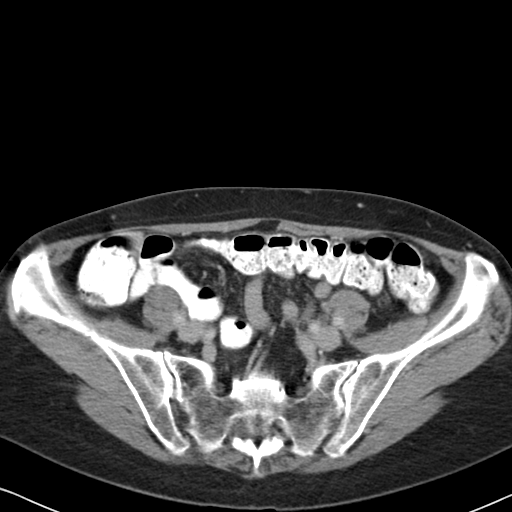
[im 36/90  soft-tissue]
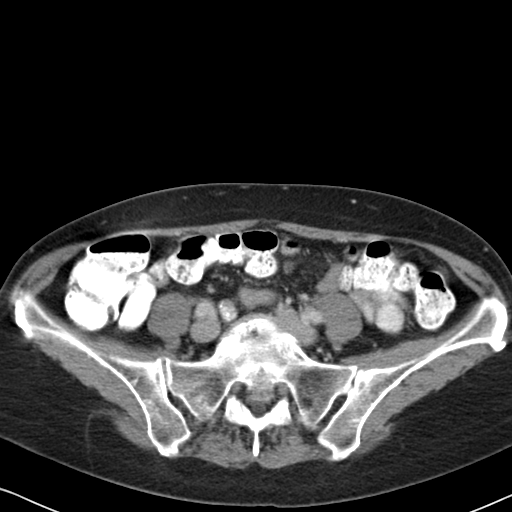
[im 41/90  soft-tissue]
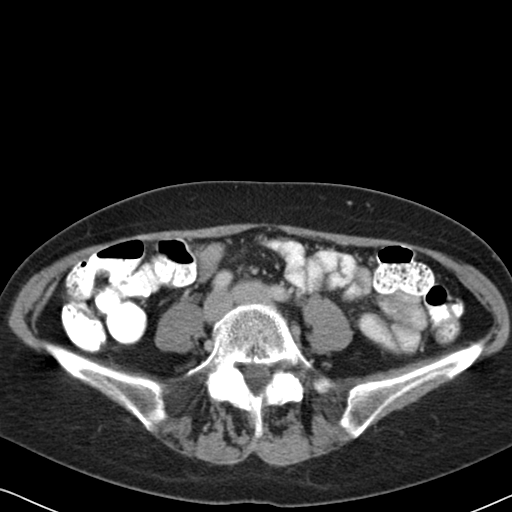
[im 49/90  soft-tissue]
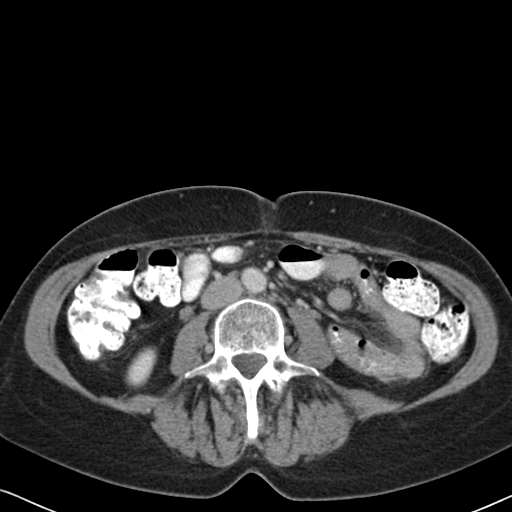
[im 54/90  soft-tissue]
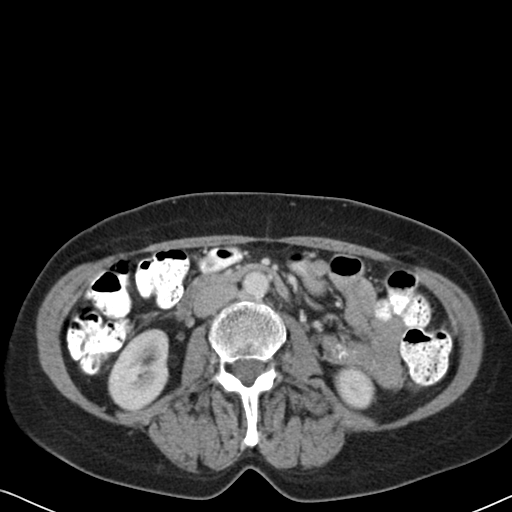
[im 54/90  bone]
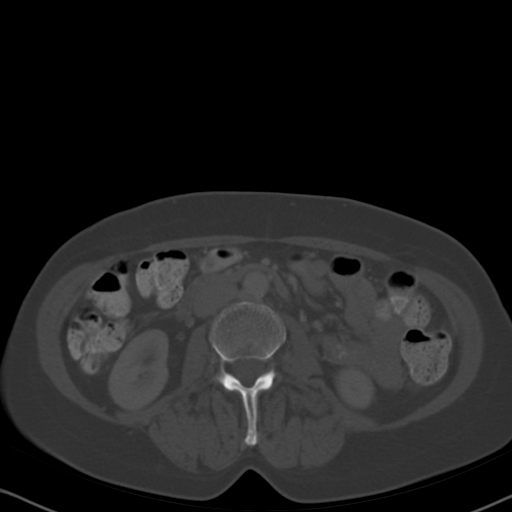
[im 58/90  soft-tissue]
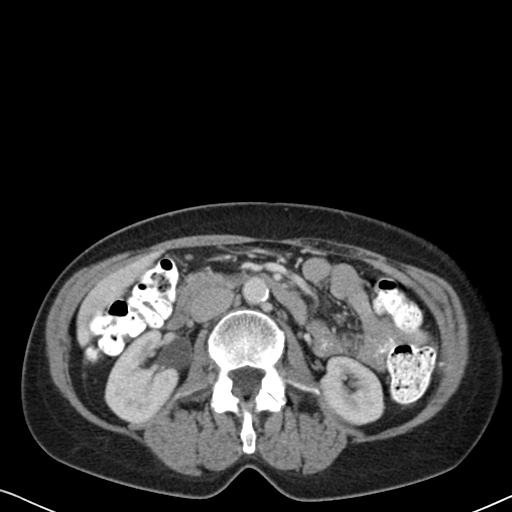
[im 67/90  soft-tissue]
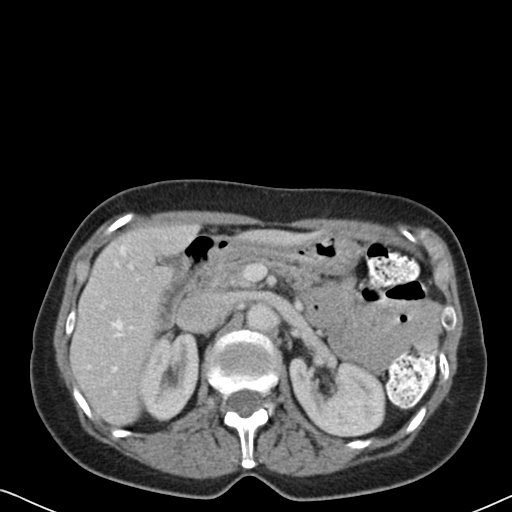
[im 72/90  soft-tissue]
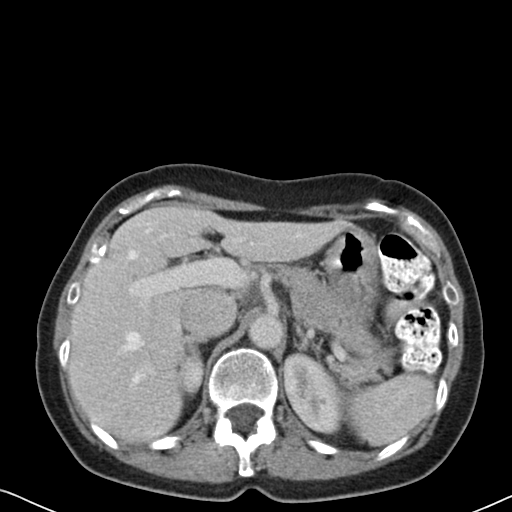
[im 76/90  soft-tissue]
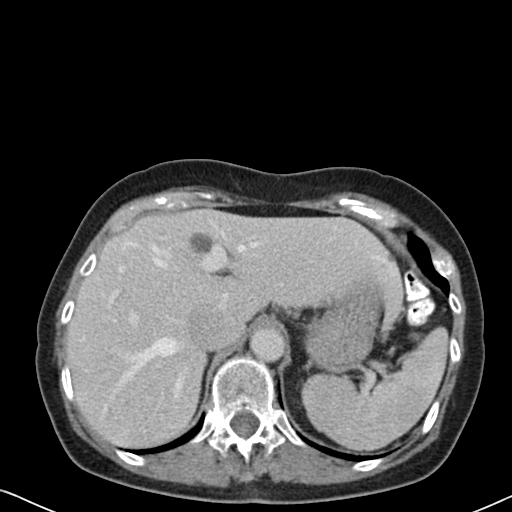
[im 85/90  soft-tissue]
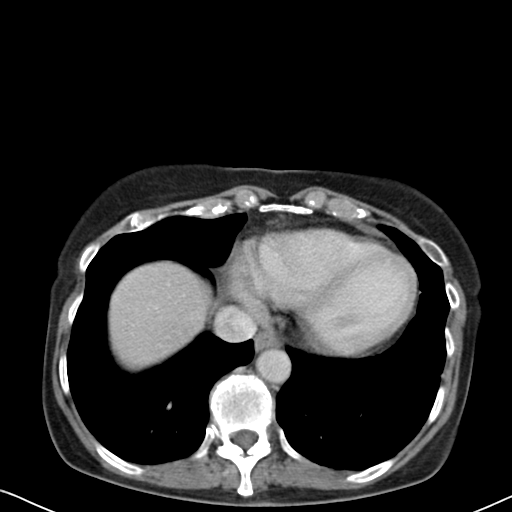

[Series 602: cor · coronal · 0.90mm/px · 3 of 86 slices shown]
[im 29/86  soft-tissue]
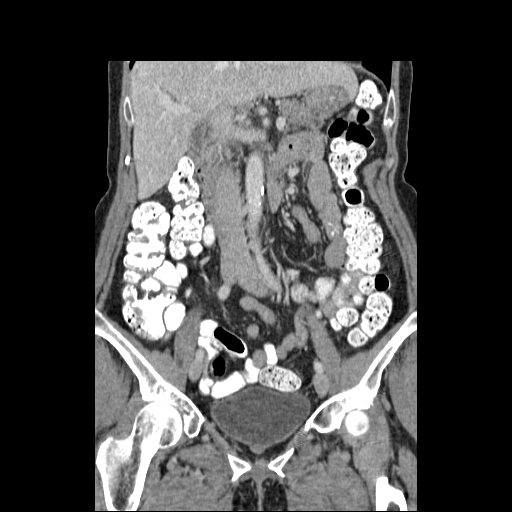
[im 38/86  soft-tissue]
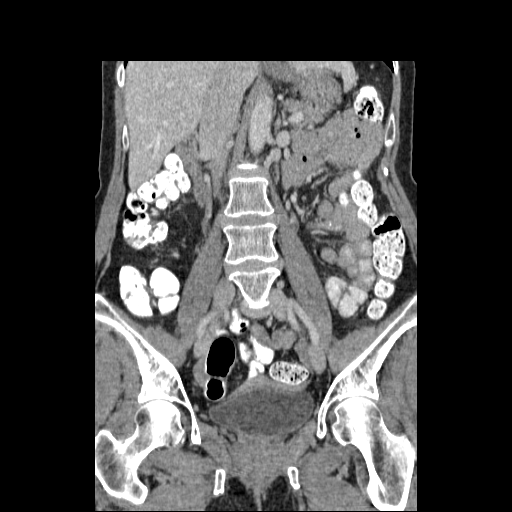
[im 48/86  soft-tissue]
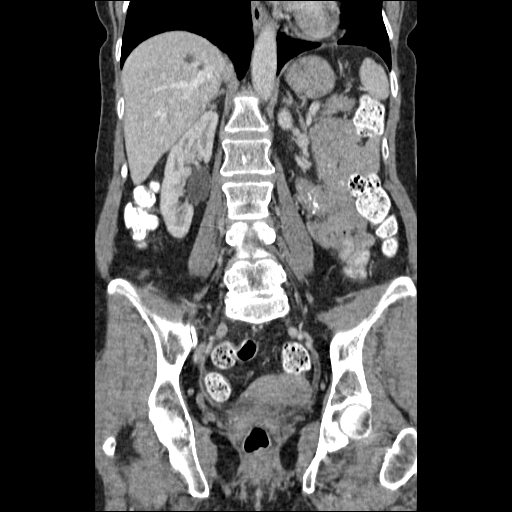

[17 of 46 positions shown; findings below may reference images not displayed]

FINDINGS: Several small fluid attenuation hepatic cysts are seen in
both lobes, but no liver masses are identified.  The gallbladder is
unremarkable.  The pancreas, spleen, adrenal glands, and left
kidney are normal appearance.  Tiny less 1 cm right renal cyst
noted but no evidence of renal mass or hydronephrosis.

No soft tissue masses or lymphadenopathy are seen elsewhere within
the abdomen or pelvis. Normal appearing appendix is visualized. A
well-circumscribed fibroid is seen involving the left posterior
uterus measuring 3.0 x 4.5 cm.  Adnexa are unremarkable.  No
evidence of inflammatory process or abnormal fluid collections.  No
evidence of bowel wall thickening or dilatation.
IMPRESSION: 1.  No acute findings or other significant abnormality.
2.  Tiny benign hepatic and right renal cysts incidentally noted.

## 2014-04-22 ENCOUNTER — Other Ambulatory Visit: Payer: Self-pay

## 2014-04-22 DIAGNOSIS — Z1231 Encounter for screening mammogram for malignant neoplasm of breast: Secondary | ICD-10-CM

## 2014-05-24 ENCOUNTER — Ambulatory Visit
Admission: RE | Admit: 2014-05-24 | Discharge: 2014-05-24 | Disposition: A | Discharge status: Home / Self Care | Payer: Medicare Other | Source: Ambulatory Visit

## 2014-05-24 ENCOUNTER — Encounter (INDEPENDENT_AMBULATORY_CARE_PROVIDER_SITE_OTHER): Payer: Self-pay

## 2014-05-24 DIAGNOSIS — Z1231 Encounter for screening mammogram for malignant neoplasm of breast: Secondary | ICD-10-CM

## 2014-09-08 ENCOUNTER — Other Ambulatory Visit (HOSPITAL_COMMUNITY)
Admission: RE | Admit: 2014-09-08 | Discharge: 2014-09-08 | Disposition: A | Discharge status: Home / Self Care | Payer: Medicare Other | Source: Ambulatory Visit | Attending: Family Medicine | Admitting: Family Medicine

## 2014-09-08 ENCOUNTER — Other Ambulatory Visit: Payer: Self-pay | Admitting: Family Medicine

## 2014-09-08 DIAGNOSIS — Z124 Encounter for screening for malignant neoplasm of cervix: Secondary | ICD-10-CM | POA: Diagnosis present

## 2014-09-09 LAB — CYTOLOGY - PAP

## 2014-10-27 DIAGNOSIS — R300 Dysuria: Secondary | ICD-10-CM | POA: Diagnosis not present

## 2014-10-27 DIAGNOSIS — Z79899 Other long term (current) drug therapy: Secondary | ICD-10-CM | POA: Diagnosis not present

## 2014-11-24 DIAGNOSIS — M1711 Unilateral primary osteoarthritis, right knee: Secondary | ICD-10-CM | POA: Diagnosis not present

## 2014-11-24 DIAGNOSIS — M7062 Trochanteric bursitis, left hip: Secondary | ICD-10-CM | POA: Diagnosis not present

## 2014-12-02 DIAGNOSIS — H2513 Age-related nuclear cataract, bilateral: Secondary | ICD-10-CM | POA: Diagnosis not present

## 2014-12-02 DIAGNOSIS — M3210 Systemic lupus erythematosus, organ or system involvement unspecified: Secondary | ICD-10-CM | POA: Diagnosis not present

## 2014-12-02 DIAGNOSIS — Z79899 Other long term (current) drug therapy: Secondary | ICD-10-CM | POA: Diagnosis not present

## 2014-12-02 DIAGNOSIS — H25013 Cortical age-related cataract, bilateral: Secondary | ICD-10-CM | POA: Diagnosis not present

## 2014-12-21 DIAGNOSIS — N390 Urinary tract infection, site not specified: Secondary | ICD-10-CM | POA: Diagnosis not present

## 2014-12-28 DIAGNOSIS — M359 Systemic involvement of connective tissue, unspecified: Secondary | ICD-10-CM | POA: Diagnosis not present

## 2014-12-28 DIAGNOSIS — M19041 Primary osteoarthritis, right hand: Secondary | ICD-10-CM | POA: Diagnosis not present

## 2014-12-28 DIAGNOSIS — M170 Bilateral primary osteoarthritis of knee: Secondary | ICD-10-CM | POA: Diagnosis not present

## 2014-12-28 DIAGNOSIS — M8580 Other specified disorders of bone density and structure, unspecified site: Secondary | ICD-10-CM | POA: Diagnosis not present

## 2015-03-03 DIAGNOSIS — M329 Systemic lupus erythematosus, unspecified: Secondary | ICD-10-CM | POA: Diagnosis not present

## 2015-03-03 DIAGNOSIS — I7300 Raynaud's syndrome without gangrene: Secondary | ICD-10-CM | POA: Diagnosis not present

## 2015-03-03 DIAGNOSIS — M3500 Sicca syndrome, unspecified: Secondary | ICD-10-CM | POA: Diagnosis not present

## 2015-03-03 DIAGNOSIS — E039 Hypothyroidism, unspecified: Secondary | ICD-10-CM | POA: Diagnosis not present

## 2015-03-07 DIAGNOSIS — M359 Systemic involvement of connective tissue, unspecified: Secondary | ICD-10-CM | POA: Diagnosis not present

## 2015-03-07 DIAGNOSIS — R300 Dysuria: Secondary | ICD-10-CM | POA: Diagnosis not present

## 2015-03-07 DIAGNOSIS — Z79899 Other long term (current) drug therapy: Secondary | ICD-10-CM | POA: Diagnosis not present

## 2015-03-07 DIAGNOSIS — I7300 Raynaud's syndrome without gangrene: Secondary | ICD-10-CM | POA: Diagnosis not present

## 2015-03-07 DIAGNOSIS — H25013 Cortical age-related cataract, bilateral: Secondary | ICD-10-CM | POA: Diagnosis not present

## 2015-03-07 DIAGNOSIS — H2513 Age-related nuclear cataract, bilateral: Secondary | ICD-10-CM | POA: Diagnosis not present

## 2015-03-07 DIAGNOSIS — M3500 Sicca syndrome, unspecified: Secondary | ICD-10-CM | POA: Diagnosis not present

## 2015-03-07 DIAGNOSIS — M3210 Systemic lupus erythematosus, organ or system involvement unspecified: Secondary | ICD-10-CM | POA: Diagnosis not present

## 2015-03-07 DIAGNOSIS — M2550 Pain in unspecified joint: Secondary | ICD-10-CM | POA: Diagnosis not present

## 2015-03-09 DIAGNOSIS — Z23 Encounter for immunization: Secondary | ICD-10-CM | POA: Diagnosis not present

## 2015-03-09 DIAGNOSIS — M859 Disorder of bone density and structure, unspecified: Secondary | ICD-10-CM | POA: Diagnosis not present

## 2015-03-09 DIAGNOSIS — M3500 Sicca syndrome, unspecified: Secondary | ICD-10-CM | POA: Diagnosis not present

## 2015-03-09 DIAGNOSIS — E559 Vitamin D deficiency, unspecified: Secondary | ICD-10-CM | POA: Diagnosis not present

## 2015-03-09 DIAGNOSIS — H25011 Cortical age-related cataract, right eye: Secondary | ICD-10-CM | POA: Diagnosis not present

## 2015-03-09 DIAGNOSIS — H2511 Age-related nuclear cataract, right eye: Secondary | ICD-10-CM | POA: Diagnosis not present

## 2015-03-09 DIAGNOSIS — F339 Major depressive disorder, recurrent, unspecified: Secondary | ICD-10-CM | POA: Diagnosis not present

## 2015-03-09 DIAGNOSIS — Z780 Asymptomatic menopausal state: Secondary | ICD-10-CM | POA: Diagnosis not present

## 2015-03-09 DIAGNOSIS — M329 Systemic lupus erythematosus, unspecified: Secondary | ICD-10-CM | POA: Diagnosis not present

## 2015-03-09 DIAGNOSIS — Z0000 Encounter for general adult medical examination without abnormal findings: Secondary | ICD-10-CM | POA: Diagnosis not present

## 2015-03-09 DIAGNOSIS — E039 Hypothyroidism, unspecified: Secondary | ICD-10-CM | POA: Diagnosis not present

## 2015-03-09 DIAGNOSIS — B351 Tinea unguium: Secondary | ICD-10-CM | POA: Diagnosis not present

## 2015-03-09 DIAGNOSIS — G479 Sleep disorder, unspecified: Secondary | ICD-10-CM | POA: Diagnosis not present

## 2015-03-09 DIAGNOSIS — N390 Urinary tract infection, site not specified: Secondary | ICD-10-CM | POA: Diagnosis not present

## 2015-03-16 DIAGNOSIS — H2512 Age-related nuclear cataract, left eye: Secondary | ICD-10-CM | POA: Diagnosis not present

## 2015-03-16 DIAGNOSIS — H25012 Cortical age-related cataract, left eye: Secondary | ICD-10-CM | POA: Diagnosis not present

## 2015-03-16 DIAGNOSIS — Z961 Presence of intraocular lens: Secondary | ICD-10-CM | POA: Diagnosis not present

## 2015-03-16 DIAGNOSIS — H2511 Age-related nuclear cataract, right eye: Secondary | ICD-10-CM | POA: Diagnosis not present

## 2015-03-16 DIAGNOSIS — H25011 Cortical age-related cataract, right eye: Secondary | ICD-10-CM | POA: Diagnosis not present

## 2015-03-23 DIAGNOSIS — H25012 Cortical age-related cataract, left eye: Secondary | ICD-10-CM | POA: Diagnosis not present

## 2015-03-23 DIAGNOSIS — H2512 Age-related nuclear cataract, left eye: Secondary | ICD-10-CM | POA: Diagnosis not present

## 2015-04-06 DIAGNOSIS — M170 Bilateral primary osteoarthritis of knee: Secondary | ICD-10-CM | POA: Diagnosis not present

## 2015-04-13 DIAGNOSIS — M170 Bilateral primary osteoarthritis of knee: Secondary | ICD-10-CM | POA: Diagnosis not present

## 2015-04-18 ENCOUNTER — Other Ambulatory Visit: Payer: Self-pay

## 2015-04-18 DIAGNOSIS — Z1231 Encounter for screening mammogram for malignant neoplasm of breast: Secondary | ICD-10-CM

## 2015-04-20 DIAGNOSIS — M170 Bilateral primary osteoarthritis of knee: Secondary | ICD-10-CM | POA: Diagnosis not present

## 2015-05-26 ENCOUNTER — Ambulatory Visit
Admission: RE | Admit: 2015-05-26 | Discharge: 2015-05-26 | Disposition: A | Discharge status: Home / Self Care | Payer: Medicare Other | Source: Ambulatory Visit

## 2015-05-26 DIAGNOSIS — Z1231 Encounter for screening mammogram for malignant neoplasm of breast: Secondary | ICD-10-CM

## 2015-06-01 DIAGNOSIS — Z79899 Other long term (current) drug therapy: Secondary | ICD-10-CM | POA: Diagnosis not present

## 2015-06-01 DIAGNOSIS — M7072 Other bursitis of hip, left hip: Secondary | ICD-10-CM | POA: Diagnosis not present

## 2015-06-01 DIAGNOSIS — M3500 Sicca syndrome, unspecified: Secondary | ICD-10-CM | POA: Diagnosis not present

## 2015-06-01 DIAGNOSIS — I7300 Raynaud's syndrome without gangrene: Secondary | ICD-10-CM | POA: Diagnosis not present

## 2015-06-01 DIAGNOSIS — S93402A Sprain of unspecified ligament of left ankle, initial encounter: Secondary | ICD-10-CM | POA: Diagnosis not present

## 2015-06-15 DIAGNOSIS — R5383 Other fatigue: Secondary | ICD-10-CM | POA: Diagnosis not present

## 2015-06-17 DIAGNOSIS — Z79899 Other long term (current) drug therapy: Secondary | ICD-10-CM | POA: Diagnosis not present

## 2015-07-08 DIAGNOSIS — Z23 Encounter for immunization: Secondary | ICD-10-CM | POA: Diagnosis not present

## 2015-07-11 DIAGNOSIS — M3210 Systemic lupus erythematosus, organ or system involvement unspecified: Secondary | ICD-10-CM | POA: Diagnosis not present

## 2015-07-11 DIAGNOSIS — Z961 Presence of intraocular lens: Secondary | ICD-10-CM | POA: Diagnosis not present

## 2015-07-20 DIAGNOSIS — L247 Irritant contact dermatitis due to plants, except food: Secondary | ICD-10-CM | POA: Diagnosis not present

## 2015-09-06 DIAGNOSIS — E559 Vitamin D deficiency, unspecified: Secondary | ICD-10-CM | POA: Diagnosis not present

## 2015-09-06 DIAGNOSIS — M3500 Sicca syndrome, unspecified: Secondary | ICD-10-CM | POA: Diagnosis not present

## 2015-09-06 DIAGNOSIS — M329 Systemic lupus erythematosus, unspecified: Secondary | ICD-10-CM | POA: Diagnosis not present

## 2015-09-06 DIAGNOSIS — N390 Urinary tract infection, site not specified: Secondary | ICD-10-CM | POA: Diagnosis not present

## 2015-09-06 DIAGNOSIS — Z0000 Encounter for general adult medical examination without abnormal findings: Secondary | ICD-10-CM | POA: Diagnosis not present

## 2015-09-06 DIAGNOSIS — E039 Hypothyroidism, unspecified: Secondary | ICD-10-CM | POA: Diagnosis not present

## 2015-09-06 DIAGNOSIS — Z780 Asymptomatic menopausal state: Secondary | ICD-10-CM | POA: Diagnosis not present

## 2015-09-06 DIAGNOSIS — G479 Sleep disorder, unspecified: Secondary | ICD-10-CM | POA: Diagnosis not present

## 2015-09-06 DIAGNOSIS — Z23 Encounter for immunization: Secondary | ICD-10-CM | POA: Diagnosis not present

## 2015-09-06 DIAGNOSIS — B351 Tinea unguium: Secondary | ICD-10-CM | POA: Diagnosis not present

## 2015-09-06 DIAGNOSIS — R5383 Other fatigue: Secondary | ICD-10-CM | POA: Diagnosis not present

## 2015-09-06 DIAGNOSIS — M859 Disorder of bone density and structure, unspecified: Secondary | ICD-10-CM | POA: Diagnosis not present

## 2015-09-12 DIAGNOSIS — F3342 Major depressive disorder, recurrent, in full remission: Secondary | ICD-10-CM | POA: Diagnosis not present

## 2015-09-12 DIAGNOSIS — E039 Hypothyroidism, unspecified: Secondary | ICD-10-CM | POA: Diagnosis not present

## 2015-09-12 DIAGNOSIS — M85852 Other specified disorders of bone density and structure, left thigh: Secondary | ICD-10-CM | POA: Diagnosis not present

## 2015-09-12 DIAGNOSIS — B351 Tinea unguium: Secondary | ICD-10-CM | POA: Diagnosis not present

## 2015-09-12 DIAGNOSIS — I7300 Raynaud's syndrome without gangrene: Secondary | ICD-10-CM | POA: Diagnosis not present

## 2015-09-12 DIAGNOSIS — G479 Sleep disorder, unspecified: Secondary | ICD-10-CM | POA: Diagnosis not present

## 2015-09-12 DIAGNOSIS — Z0000 Encounter for general adult medical examination without abnormal findings: Secondary | ICD-10-CM | POA: Diagnosis not present

## 2015-09-12 DIAGNOSIS — J309 Allergic rhinitis, unspecified: Secondary | ICD-10-CM | POA: Diagnosis not present

## 2015-09-12 DIAGNOSIS — R51 Headache: Secondary | ICD-10-CM | POA: Diagnosis not present

## 2015-09-12 DIAGNOSIS — E559 Vitamin D deficiency, unspecified: Secondary | ICD-10-CM | POA: Diagnosis not present

## 2015-09-12 DIAGNOSIS — M329 Systemic lupus erythematosus, unspecified: Secondary | ICD-10-CM | POA: Diagnosis not present

## 2015-09-12 DIAGNOSIS — M3500 Sicca syndrome, unspecified: Secondary | ICD-10-CM | POA: Diagnosis not present

## 2015-09-13 ENCOUNTER — Other Ambulatory Visit: Payer: Self-pay | Admitting: Family Medicine

## 2015-09-13 DIAGNOSIS — M8580 Other specified disorders of bone density and structure, unspecified site: Secondary | ICD-10-CM

## 2015-10-26 DIAGNOSIS — Z79899 Other long term (current) drug therapy: Secondary | ICD-10-CM | POA: Diagnosis not present

## 2015-11-01 DIAGNOSIS — M7072 Other bursitis of hip, left hip: Secondary | ICD-10-CM | POA: Diagnosis not present

## 2015-11-01 DIAGNOSIS — M170 Bilateral primary osteoarthritis of knee: Secondary | ICD-10-CM | POA: Diagnosis not present

## 2015-11-01 DIAGNOSIS — Z79899 Other long term (current) drug therapy: Secondary | ICD-10-CM | POA: Diagnosis not present

## 2015-11-01 DIAGNOSIS — M3500 Sicca syndrome, unspecified: Secondary | ICD-10-CM | POA: Diagnosis not present

## 2015-11-03 ENCOUNTER — Ambulatory Visit
Admission: RE | Admit: 2015-11-03 | Discharge: 2015-11-03 | Disposition: A | Discharge status: Home / Self Care | Payer: Medicare Other | Source: Ambulatory Visit | Attending: Family Medicine | Admitting: Family Medicine

## 2015-11-03 DIAGNOSIS — M85852 Other specified disorders of bone density and structure, left thigh: Secondary | ICD-10-CM | POA: Diagnosis not present

## 2015-11-03 DIAGNOSIS — M8580 Other specified disorders of bone density and structure, unspecified site: Secondary | ICD-10-CM

## 2015-11-04 DIAGNOSIS — M779 Enthesopathy, unspecified: Secondary | ICD-10-CM | POA: Diagnosis not present

## 2015-11-04 DIAGNOSIS — L608 Other nail disorders: Secondary | ICD-10-CM | POA: Diagnosis not present

## 2015-11-08 DIAGNOSIS — M7060 Trochanteric bursitis, unspecified hip: Secondary | ICD-10-CM | POA: Diagnosis not present

## 2015-11-08 DIAGNOSIS — M6281 Muscle weakness (generalized): Secondary | ICD-10-CM | POA: Diagnosis not present

## 2015-11-08 DIAGNOSIS — M62838 Other muscle spasm: Secondary | ICD-10-CM | POA: Diagnosis not present

## 2015-11-08 DIAGNOSIS — M791 Myalgia: Secondary | ICD-10-CM | POA: Diagnosis not present

## 2015-11-11 DIAGNOSIS — M62838 Other muscle spasm: Secondary | ICD-10-CM | POA: Diagnosis not present

## 2015-11-11 DIAGNOSIS — M6281 Muscle weakness (generalized): Secondary | ICD-10-CM | POA: Diagnosis not present

## 2015-11-11 DIAGNOSIS — M791 Myalgia: Secondary | ICD-10-CM | POA: Diagnosis not present

## 2015-11-11 DIAGNOSIS — M7060 Trochanteric bursitis, unspecified hip: Secondary | ICD-10-CM | POA: Diagnosis not present

## 2015-11-14 DIAGNOSIS — M6281 Muscle weakness (generalized): Secondary | ICD-10-CM | POA: Diagnosis not present

## 2015-11-14 DIAGNOSIS — M62838 Other muscle spasm: Secondary | ICD-10-CM | POA: Diagnosis not present

## 2015-11-14 DIAGNOSIS — M791 Myalgia: Secondary | ICD-10-CM | POA: Diagnosis not present

## 2015-11-14 DIAGNOSIS — M7060 Trochanteric bursitis, unspecified hip: Secondary | ICD-10-CM | POA: Diagnosis not present

## 2015-11-21 DIAGNOSIS — L600 Ingrowing nail: Secondary | ICD-10-CM | POA: Diagnosis not present

## 2015-11-21 DIAGNOSIS — M791 Myalgia: Secondary | ICD-10-CM | POA: Diagnosis not present

## 2015-11-21 DIAGNOSIS — M62838 Other muscle spasm: Secondary | ICD-10-CM | POA: Diagnosis not present

## 2015-11-21 DIAGNOSIS — M7060 Trochanteric bursitis, unspecified hip: Secondary | ICD-10-CM | POA: Diagnosis not present

## 2015-11-21 DIAGNOSIS — M79671 Pain in right foot: Secondary | ICD-10-CM | POA: Diagnosis not present

## 2015-11-21 DIAGNOSIS — M2570 Osteophyte, unspecified joint: Secondary | ICD-10-CM | POA: Diagnosis not present

## 2015-11-21 DIAGNOSIS — M6281 Muscle weakness (generalized): Secondary | ICD-10-CM | POA: Diagnosis not present

## 2015-11-24 DIAGNOSIS — M62838 Other muscle spasm: Secondary | ICD-10-CM | POA: Diagnosis not present

## 2015-11-24 DIAGNOSIS — M6281 Muscle weakness (generalized): Secondary | ICD-10-CM | POA: Diagnosis not present

## 2015-11-24 DIAGNOSIS — M7060 Trochanteric bursitis, unspecified hip: Secondary | ICD-10-CM | POA: Diagnosis not present

## 2015-11-24 DIAGNOSIS — M791 Myalgia: Secondary | ICD-10-CM | POA: Diagnosis not present

## 2015-11-28 DIAGNOSIS — M79674 Pain in right toe(s): Secondary | ICD-10-CM | POA: Diagnosis not present

## 2015-11-28 DIAGNOSIS — B351 Tinea unguium: Secondary | ICD-10-CM | POA: Diagnosis not present

## 2015-11-28 DIAGNOSIS — M79675 Pain in left toe(s): Secondary | ICD-10-CM | POA: Diagnosis not present

## 2015-12-01 DIAGNOSIS — M791 Myalgia: Secondary | ICD-10-CM | POA: Diagnosis not present

## 2015-12-01 DIAGNOSIS — M6281 Muscle weakness (generalized): Secondary | ICD-10-CM | POA: Diagnosis not present

## 2015-12-01 DIAGNOSIS — M7060 Trochanteric bursitis, unspecified hip: Secondary | ICD-10-CM | POA: Diagnosis not present

## 2015-12-01 DIAGNOSIS — M62838 Other muscle spasm: Secondary | ICD-10-CM | POA: Diagnosis not present

## 2015-12-05 DIAGNOSIS — L03031 Cellulitis of right toe: Secondary | ICD-10-CM | POA: Diagnosis not present

## 2015-12-06 DIAGNOSIS — M7060 Trochanteric bursitis, unspecified hip: Secondary | ICD-10-CM | POA: Diagnosis not present

## 2015-12-06 DIAGNOSIS — M6281 Muscle weakness (generalized): Secondary | ICD-10-CM | POA: Diagnosis not present

## 2015-12-06 DIAGNOSIS — M62838 Other muscle spasm: Secondary | ICD-10-CM | POA: Diagnosis not present

## 2015-12-06 DIAGNOSIS — M791 Myalgia: Secondary | ICD-10-CM | POA: Diagnosis not present

## 2015-12-12 DIAGNOSIS — M19271 Secondary osteoarthritis, right ankle and foot: Secondary | ICD-10-CM | POA: Diagnosis not present

## 2015-12-12 DIAGNOSIS — L03032 Cellulitis of left toe: Secondary | ICD-10-CM | POA: Diagnosis not present

## 2015-12-12 DIAGNOSIS — M19241 Secondary osteoarthritis, right hand: Secondary | ICD-10-CM | POA: Diagnosis not present

## 2015-12-12 DIAGNOSIS — M170 Bilateral primary osteoarthritis of knee: Secondary | ICD-10-CM | POA: Diagnosis not present

## 2015-12-12 DIAGNOSIS — M7072 Other bursitis of hip, left hip: Secondary | ICD-10-CM | POA: Diagnosis not present

## 2015-12-14 DIAGNOSIS — M170 Bilateral primary osteoarthritis of knee: Secondary | ICD-10-CM | POA: Diagnosis not present

## 2015-12-20 DIAGNOSIS — M791 Myalgia: Secondary | ICD-10-CM | POA: Diagnosis not present

## 2015-12-20 DIAGNOSIS — M7060 Trochanteric bursitis, unspecified hip: Secondary | ICD-10-CM | POA: Diagnosis not present

## 2015-12-20 DIAGNOSIS — M62838 Other muscle spasm: Secondary | ICD-10-CM | POA: Diagnosis not present

## 2015-12-20 DIAGNOSIS — M6281 Muscle weakness (generalized): Secondary | ICD-10-CM | POA: Diagnosis not present

## 2015-12-21 DIAGNOSIS — M170 Bilateral primary osteoarthritis of knee: Secondary | ICD-10-CM | POA: Diagnosis not present

## 2015-12-26 DIAGNOSIS — M79674 Pain in right toe(s): Secondary | ICD-10-CM | POA: Diagnosis not present

## 2015-12-26 DIAGNOSIS — B351 Tinea unguium: Secondary | ICD-10-CM | POA: Diagnosis not present

## 2015-12-26 DIAGNOSIS — M79675 Pain in left toe(s): Secondary | ICD-10-CM | POA: Diagnosis not present

## 2015-12-28 DIAGNOSIS — M170 Bilateral primary osteoarthritis of knee: Secondary | ICD-10-CM | POA: Diagnosis not present

## 2016-01-02 DIAGNOSIS — M3210 Systemic lupus erythematosus, organ or system involvement unspecified: Secondary | ICD-10-CM | POA: Diagnosis not present

## 2016-01-02 DIAGNOSIS — Z961 Presence of intraocular lens: Secondary | ICD-10-CM | POA: Diagnosis not present

## 2016-01-02 DIAGNOSIS — Z79899 Other long term (current) drug therapy: Secondary | ICD-10-CM | POA: Diagnosis not present

## 2016-01-02 DIAGNOSIS — M797 Fibromyalgia: Secondary | ICD-10-CM | POA: Diagnosis not present

## 2016-01-23 DIAGNOSIS — B351 Tinea unguium: Secondary | ICD-10-CM | POA: Diagnosis not present

## 2016-01-23 DIAGNOSIS — M79674 Pain in right toe(s): Secondary | ICD-10-CM | POA: Diagnosis not present

## 2016-01-23 DIAGNOSIS — M79675 Pain in left toe(s): Secondary | ICD-10-CM | POA: Diagnosis not present

## 2016-02-20 DIAGNOSIS — M79674 Pain in right toe(s): Secondary | ICD-10-CM | POA: Diagnosis not present

## 2016-02-20 DIAGNOSIS — M79675 Pain in left toe(s): Secondary | ICD-10-CM | POA: Diagnosis not present

## 2016-02-20 DIAGNOSIS — B351 Tinea unguium: Secondary | ICD-10-CM | POA: Diagnosis not present

## 2016-03-14 DIAGNOSIS — L720 Epidermal cyst: Secondary | ICD-10-CM | POA: Diagnosis not present

## 2016-03-14 DIAGNOSIS — B078 Other viral warts: Secondary | ICD-10-CM | POA: Diagnosis not present

## 2016-03-14 DIAGNOSIS — L821 Other seborrheic keratosis: Secondary | ICD-10-CM | POA: Diagnosis not present

## 2016-03-14 DIAGNOSIS — D485 Neoplasm of uncertain behavior of skin: Secondary | ICD-10-CM | POA: Diagnosis not present

## 2016-03-22 DIAGNOSIS — R300 Dysuria: Secondary | ICD-10-CM | POA: Diagnosis not present

## 2016-03-22 DIAGNOSIS — Z79899 Other long term (current) drug therapy: Secondary | ICD-10-CM | POA: Diagnosis not present

## 2016-04-30 ENCOUNTER — Other Ambulatory Visit (HOSPITAL_COMMUNITY): Payer: Self-pay | Admitting: Rheumatology

## 2016-04-30 DIAGNOSIS — M7072 Other bursitis of hip, left hip: Secondary | ICD-10-CM | POA: Diagnosis not present

## 2016-04-30 DIAGNOSIS — Z09 Encounter for follow-up examination after completed treatment for conditions other than malignant neoplasm: Secondary | ICD-10-CM | POA: Diagnosis not present

## 2016-04-30 DIAGNOSIS — M359 Systemic involvement of connective tissue, unspecified: Secondary | ICD-10-CM | POA: Diagnosis not present

## 2016-04-30 DIAGNOSIS — M25561 Pain in right knee: Secondary | ICD-10-CM | POA: Diagnosis not present

## 2016-04-30 DIAGNOSIS — M25552 Pain in left hip: Secondary | ICD-10-CM

## 2016-05-01 ENCOUNTER — Other Ambulatory Visit: Payer: Self-pay | Admitting: Family Medicine

## 2016-05-01 DIAGNOSIS — Z1231 Encounter for screening mammogram for malignant neoplasm of breast: Secondary | ICD-10-CM

## 2016-05-03 DIAGNOSIS — R52 Pain, unspecified: Secondary | ICD-10-CM | POA: Diagnosis not present

## 2016-05-03 DIAGNOSIS — Z79899 Other long term (current) drug therapy: Secondary | ICD-10-CM | POA: Diagnosis not present

## 2016-05-03 DIAGNOSIS — M2550 Pain in unspecified joint: Secondary | ICD-10-CM | POA: Diagnosis not present

## 2016-05-03 DIAGNOSIS — R300 Dysuria: Secondary | ICD-10-CM | POA: Diagnosis not present

## 2016-05-07 ENCOUNTER — Encounter (HOSPITAL_COMMUNITY): Payer: Self-pay | Admitting: Radiology

## 2016-05-07 ENCOUNTER — Ambulatory Visit (HOSPITAL_COMMUNITY)
Admission: RE | Admit: 2016-05-07 | Discharge: 2016-05-07 | Disposition: A | Discharge status: Home / Self Care | Payer: Medicare Other | Source: Ambulatory Visit | Attending: Rheumatology | Admitting: Rheumatology

## 2016-05-07 DIAGNOSIS — M160 Bilateral primary osteoarthritis of hip: Secondary | ICD-10-CM | POA: Insufficient documentation

## 2016-05-07 DIAGNOSIS — M25552 Pain in left hip: Secondary | ICD-10-CM | POA: Insufficient documentation

## 2016-05-07 DIAGNOSIS — D252 Subserosal leiomyoma of uterus: Secondary | ICD-10-CM | POA: Insufficient documentation

## 2016-05-07 DIAGNOSIS — M5137 Other intervertebral disc degeneration, lumbosacral region: Secondary | ICD-10-CM | POA: Insufficient documentation

## 2016-05-17 DIAGNOSIS — R5383 Other fatigue: Secondary | ICD-10-CM | POA: Diagnosis not present

## 2016-05-17 DIAGNOSIS — R42 Dizziness and giddiness: Secondary | ICD-10-CM | POA: Diagnosis not present

## 2016-05-28 ENCOUNTER — Ambulatory Visit
Admission: RE | Admit: 2016-05-28 | Discharge: 2016-05-28 | Disposition: A | Discharge status: Home / Self Care | Payer: Medicare Other | Source: Ambulatory Visit | Attending: Family Medicine | Admitting: Family Medicine

## 2016-05-28 DIAGNOSIS — Z1231 Encounter for screening mammogram for malignant neoplasm of breast: Secondary | ICD-10-CM

## 2016-06-11 DIAGNOSIS — M25572 Pain in left ankle and joints of left foot: Secondary | ICD-10-CM | POA: Diagnosis not present

## 2016-07-04 DIAGNOSIS — M170 Bilateral primary osteoarthritis of knee: Secondary | ICD-10-CM | POA: Diagnosis not present

## 2016-07-09 DIAGNOSIS — Z23 Encounter for immunization: Secondary | ICD-10-CM | POA: Diagnosis not present

## 2016-07-11 DIAGNOSIS — M1712 Unilateral primary osteoarthritis, left knee: Secondary | ICD-10-CM | POA: Diagnosis not present

## 2016-07-11 DIAGNOSIS — M1711 Unilateral primary osteoarthritis, right knee: Secondary | ICD-10-CM | POA: Diagnosis not present

## 2016-07-12 DIAGNOSIS — M25552 Pain in left hip: Secondary | ICD-10-CM | POA: Diagnosis not present

## 2016-07-18 DIAGNOSIS — M170 Bilateral primary osteoarthritis of knee: Secondary | ICD-10-CM | POA: Diagnosis not present

## 2016-07-23 DIAGNOSIS — M3210 Systemic lupus erythematosus, organ or system involvement unspecified: Secondary | ICD-10-CM | POA: Diagnosis not present

## 2016-07-23 DIAGNOSIS — H524 Presbyopia: Secondary | ICD-10-CM | POA: Diagnosis not present

## 2016-07-23 DIAGNOSIS — M797 Fibromyalgia: Secondary | ICD-10-CM | POA: Diagnosis not present

## 2016-07-23 DIAGNOSIS — Z79899 Other long term (current) drug therapy: Secondary | ICD-10-CM | POA: Diagnosis not present

## 2016-07-23 DIAGNOSIS — M329 Systemic lupus erythematosus, unspecified: Secondary | ICD-10-CM | POA: Diagnosis not present

## 2016-07-23 DIAGNOSIS — Z961 Presence of intraocular lens: Secondary | ICD-10-CM | POA: Diagnosis not present

## 2016-07-26 DIAGNOSIS — M25552 Pain in left hip: Secondary | ICD-10-CM | POA: Diagnosis not present

## 2016-08-15 DIAGNOSIS — M7062 Trochanteric bursitis, left hip: Secondary | ICD-10-CM | POA: Diagnosis not present

## 2016-08-15 DIAGNOSIS — N390 Urinary tract infection, site not specified: Secondary | ICD-10-CM | POA: Diagnosis not present

## 2016-08-15 DIAGNOSIS — M1612 Unilateral primary osteoarthritis, left hip: Secondary | ICD-10-CM | POA: Diagnosis not present

## 2016-08-30 ENCOUNTER — Other Ambulatory Visit: Payer: Self-pay | Admitting: Rheumatology

## 2016-08-30 NOTE — Telephone Encounter (Signed)
Last visit 07/18/16 Next visit 10/01/16 Ok to refill per Dr Estanislado Pandy

## 2016-09-08 ENCOUNTER — Other Ambulatory Visit: Payer: Self-pay | Admitting: Rheumatology

## 2016-09-11 ENCOUNTER — Telehealth: Payer: Self-pay | Admitting: Radiology

## 2016-09-11 DIAGNOSIS — Z79899 Other long term (current) drug therapy: Secondary | ICD-10-CM

## 2016-09-11 NOTE — Telephone Encounter (Signed)
Eye exam 9/19/16this is due  call pt to ck on eye exam

## 2016-09-11 NOTE — Telephone Encounter (Signed)
Last visit 07/18/16 Next visit 10/10/16 Labs 05/17/16 Eye exam 07/11/15 Ok to refill per Dr Estanislado Pandy / but call pt to ck on eye exam

## 2016-09-12 DIAGNOSIS — Z79899 Other long term (current) drug therapy: Secondary | ICD-10-CM | POA: Insufficient documentation

## 2016-09-12 NOTE — Telephone Encounter (Signed)
No, I can send him our form. Thank you. I am sure they have sent it, we have had lots of problems receiving faxes recently, but are working on a solution.

## 2016-09-14 DIAGNOSIS — N390 Urinary tract infection, site not specified: Secondary | ICD-10-CM | POA: Diagnosis not present

## 2016-09-18 ENCOUNTER — Other Ambulatory Visit: Payer: Self-pay | Admitting: Rheumatology

## 2016-09-18 DIAGNOSIS — R300 Dysuria: Secondary | ICD-10-CM | POA: Diagnosis not present

## 2016-09-18 DIAGNOSIS — Z79899 Other long term (current) drug therapy: Secondary | ICD-10-CM | POA: Diagnosis not present

## 2016-09-18 LAB — CBC WITH DIFFERENTIAL/PLATELET
BASOS ABS: 55 {cells}/uL (ref 0–200)
Basophils Relative: 1 %
EOS ABS: 220 {cells}/uL (ref 15–500)
EOS PCT: 4 %
HCT: 40.8 % (ref 35.0–45.0)
HEMOGLOBIN: 13.5 g/dL (ref 11.7–15.5)
Lymphocytes Relative: 19 %
Lymphs Abs: 1045 cells/uL (ref 850–3900)
MCH: 32.8 pg (ref 27.0–33.0)
MCHC: 33.1 g/dL (ref 32.0–36.0)
MCV: 99 fL (ref 80.0–100.0)
MONOS PCT: 8 %
MPV: 10.4 fL (ref 7.5–12.5)
Monocytes Absolute: 440 cells/uL (ref 200–950)
NEUTROS PCT: 68 %
Neutro Abs: 3740 cells/uL (ref 1500–7800)
PLATELETS: 235 10*3/uL (ref 140–400)
RBC: 4.12 MIL/uL (ref 3.80–5.10)
RDW: 13 % (ref 11.0–15.0)
WBC: 5.5 10*3/uL (ref 3.8–10.8)

## 2016-09-18 LAB — COMPLETE METABOLIC PANEL WITH GFR
ALBUMIN: 4.1 g/dL (ref 3.6–5.1)
ALK PHOS: 45 U/L (ref 33–130)
ALT: 11 U/L (ref 6–29)
AST: 19 U/L (ref 10–35)
BILIRUBIN TOTAL: 0.4 mg/dL (ref 0.2–1.2)
BUN: 10 mg/dL (ref 7–25)
CO2: 26 mmol/L (ref 20–31)
Calcium: 9.5 mg/dL (ref 8.6–10.4)
Chloride: 106 mmol/L (ref 98–110)
Creat: 0.76 mg/dL (ref 0.60–0.93)
GFR, Est African American: >89 mL/min (ref >=60)
GFR, Est Non African American: 80 mL/min (ref >=60)
GLUCOSE: 88 mg/dL (ref 65–99)
Potassium: 4 mmol/L (ref 3.5–5.3)
SODIUM: 141 mmol/L (ref 135–146)
TOTAL PROTEIN: 6.7 g/dL (ref 6.1–8.1)

## 2016-09-19 LAB — URINALYSIS, ROUTINE W REFLEX MICROSCOPIC
BILIRUBIN URINE: NEGATIVE
GLUCOSE, UA: NEGATIVE
Hgb urine dipstick: NEGATIVE
KETONES UR: NEGATIVE
Leukocytes, UA: NEGATIVE
Nitrite: NEGATIVE
Protein, ur: NEGATIVE
SPECIFIC GRAVITY, URINE: 1.005 (ref 1.001–1.035)
pH: 6.5 (ref 5.0–8.0)

## 2016-09-21 DIAGNOSIS — E039 Hypothyroidism, unspecified: Secondary | ICD-10-CM | POA: Diagnosis not present

## 2016-09-21 DIAGNOSIS — Z0000 Encounter for general adult medical examination without abnormal findings: Secondary | ICD-10-CM | POA: Diagnosis not present

## 2016-09-21 DIAGNOSIS — M8580 Other specified disorders of bone density and structure, unspecified site: Secondary | ICD-10-CM | POA: Diagnosis not present

## 2016-09-21 DIAGNOSIS — M85852 Other specified disorders of bone density and structure, left thigh: Secondary | ICD-10-CM | POA: Diagnosis not present

## 2016-09-25 ENCOUNTER — Telehealth: Payer: Self-pay | Admitting: Gastroenterology

## 2016-09-25 ENCOUNTER — Encounter: Payer: Self-pay | Admitting: Gastroenterology

## 2016-09-25 DIAGNOSIS — I7300 Raynaud's syndrome without gangrene: Secondary | ICD-10-CM | POA: Diagnosis not present

## 2016-09-25 DIAGNOSIS — N390 Urinary tract infection, site not specified: Secondary | ICD-10-CM | POA: Diagnosis not present

## 2016-09-25 DIAGNOSIS — M3500 Sicca syndrome, unspecified: Secondary | ICD-10-CM | POA: Diagnosis not present

## 2016-09-25 DIAGNOSIS — N952 Postmenopausal atrophic vaginitis: Secondary | ICD-10-CM | POA: Diagnosis not present

## 2016-09-25 DIAGNOSIS — Z131 Encounter for screening for diabetes mellitus: Secondary | ICD-10-CM | POA: Diagnosis not present

## 2016-09-25 DIAGNOSIS — Z0000 Encounter for general adult medical examination without abnormal findings: Secondary | ICD-10-CM | POA: Diagnosis not present

## 2016-09-25 DIAGNOSIS — E039 Hypothyroidism, unspecified: Secondary | ICD-10-CM | POA: Diagnosis not present

## 2016-09-25 DIAGNOSIS — M329 Systemic lupus erythematosus, unspecified: Secondary | ICD-10-CM | POA: Diagnosis not present

## 2016-09-25 DIAGNOSIS — F3342 Major depressive disorder, recurrent, in full remission: Secondary | ICD-10-CM | POA: Diagnosis not present

## 2016-09-25 DIAGNOSIS — M859 Disorder of bone density and structure, unspecified: Secondary | ICD-10-CM | POA: Diagnosis not present

## 2016-09-25 DIAGNOSIS — Z01419 Encounter for gynecological examination (general) (routine) without abnormal findings: Secondary | ICD-10-CM | POA: Diagnosis not present

## 2016-09-25 DIAGNOSIS — Z86010 Personal history of colonic polyps: Secondary | ICD-10-CM | POA: Diagnosis not present

## 2016-09-25 NOTE — Telephone Encounter (Signed)
Patient has been scheduled for recall colonoscopy in February.

## 2016-09-25 NOTE — Telephone Encounter (Signed)
Yes, she is due for surveillance examination colonoscopy now.  Thanks  dj

## 2016-09-25 NOTE — Telephone Encounter (Signed)
Last colonoscopy was 2012.  I do not see a recall indicated at the time.  Please advise if ok to schedule.

## 2016-09-26 NOTE — Telephone Encounter (Signed)
Ok, great.

## 2016-09-27 DIAGNOSIS — M1612 Unilateral primary osteoarthritis, left hip: Secondary | ICD-10-CM | POA: Diagnosis not present

## 2016-09-27 DIAGNOSIS — M7062 Trochanteric bursitis, left hip: Secondary | ICD-10-CM | POA: Diagnosis not present

## 2016-09-27 DIAGNOSIS — M25551 Pain in right hip: Secondary | ICD-10-CM | POA: Diagnosis not present

## 2016-10-01 ENCOUNTER — Ambulatory Visit: Payer: Self-pay | Admitting: Rheumatology

## 2016-10-05 DIAGNOSIS — N952 Postmenopausal atrophic vaginitis: Secondary | ICD-10-CM | POA: Diagnosis not present

## 2016-10-05 DIAGNOSIS — R300 Dysuria: Secondary | ICD-10-CM | POA: Diagnosis not present

## 2016-10-09 DIAGNOSIS — M7070 Other bursitis of hip, unspecified hip: Secondary | ICD-10-CM | POA: Insufficient documentation

## 2016-10-09 DIAGNOSIS — M19042 Primary osteoarthritis, left hand: Secondary | ICD-10-CM | POA: Insufficient documentation

## 2016-10-09 DIAGNOSIS — M7060 Trochanteric bursitis, unspecified hip: Secondary | ICD-10-CM | POA: Insufficient documentation

## 2016-10-09 DIAGNOSIS — M170 Bilateral primary osteoarthritis of knee: Secondary | ICD-10-CM | POA: Insufficient documentation

## 2016-10-09 DIAGNOSIS — M3500 Sicca syndrome, unspecified: Secondary | ICD-10-CM | POA: Insufficient documentation

## 2016-10-09 DIAGNOSIS — M19041 Primary osteoarthritis, right hand: Secondary | ICD-10-CM | POA: Insufficient documentation

## 2016-10-09 NOTE — Progress Notes (Addendum)
Office Visit Note  Patient: [name]             Date of Birth: July 04, 1946           MRN: [id number]             PCP: Cari Caraway, MD Referring: Cari Caraway, MD Visit Date: 10/10/2016 Occupation: @GUAROCC @    Subjective:  Left hip pain   History of Present Illness: [name] is a 70 y.o. female with history of Sjogren's syndrome and osteoarthritis. She states she developed increased left hip pain and was seen by Dr. Maureen Ralphs. The MRI done according to patient showed labral tear. After evaluation Dr. Maureen Ralphs injected her left hip joint and she had very good relief from that. She still continues to have some discomfort in the left trochanteric area. She states she is doing some exercises at home which is been helpful and the pain has come down on scale of 0-10 about one. She continues to have some sicca symptoms but they're tolerable. She denies any joint swelling. She does have some Raynaud's phenomenon especially with the cold weather.  Activities of Daily Living Patient reports morning stiffness for 10 minutes.   Patient Denies nocturnal pain.  Difficulty dressing/grooming: Denies Difficulty climbing stairs: Denies Difficulty getting out of chair: Denies Difficulty using hands for taps, buttons, cutlery, and/or writing: Reports   Review of Systems  Constitutional: Positive for fatigue. Negative for night sweats, weight gain, weight loss and weakness.  HENT: Negative for mouth sores, trouble swallowing, trouble swallowing, mouth dryness and nose dryness.   Eyes: Negative for pain, redness, visual disturbance and dryness.  Respiratory: Negative for cough, shortness of breath and difficulty breathing.   Cardiovascular: Negative for chest pain, palpitations, hypertension, irregular heartbeat and swelling in legs/feet.  Gastrointestinal: Negative for blood in stool, constipation and diarrhea.  Endocrine: Negative for increased urination.  Genitourinary: Negative for  vaginal dryness.  Musculoskeletal: Positive for arthralgias, joint pain and morning stiffness. Negative for joint swelling, myalgias, muscle weakness, muscle tenderness and myalgias.  Skin: Positive for color change. Negative for rash, hair loss, skin tightness, ulcers and sensitivity to sunlight.  Allergic/Immunologic: Negative for susceptible to infections.  Neurological: Negative for dizziness, memory loss and night sweats.  Hematological: Negative for swollen glands.  Psychiatric/Behavioral: Negative for depressed mood and sleep disturbance. The patient is not nervous/anxious.     PMFS History:  Patient Active Problem List   Diagnosis Date Noted  . Autoimmune disease ([place]) 10/23/2016  . Sjogren's syndrome ([place]) 10/09/2016  . Trochanteric bursitis 10/09/2016  . Ischial bursitis 10/09/2016  . Primary osteoarthritis of both hands 10/09/2016  . Primary osteoarthritis of both knees 10/09/2016  . High risk medication use 09/12/2016  . SBO (small bowel obstruction), partial postop 10/16/2011  . 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 Allergy   . Colon polyp   . Environmental allergies   . Hypothyroid   . Osteoarthritis   . Osteoarthritis   . Osteoarthritis   . Osteopenia   . Raynauds phenomenon   . 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 Sjogren's syndrome ([place])   . Systemic lupus (HCC)     Family History  Problem Relation Age of Onset  . Heart disease Mother   . Heart disease Brother   . Diabetes Maternal Grandfather   . Cancer Maternal Uncle     colon  Past Surgical History:  Procedure Laterality Date  . APPENDECTOMY    . BUNIONECTOMY  2008   right foot  . COLONOSCOPY W/ POLYPECTOMY  Aug 27, 2011  . ENDOMETRIAL ABLATION    . LAPAROSCOPIC APPENDECTOMY  10/11/2011   Procedure: APPENDECTOMY LAPAROSCOPIC;  Surgeon: Ardeth Sportsman, MD;  Location: [facility];   Service: General;  Laterality: N/A;  Partial Cecectomy and Appendectomy   Social History   Social History Narrative  . No narrative on file     Objective: Vital Signs: BP 122/74   Pulse 62   Resp 14   Ht 5\' 7"  (1.702 m)   Wt 148 lb (67.1 kg)   BMI 23.18 kg/m    Physical Exam  Constitutional: She is oriented to person, place, and time. She appears well-developed and well-nourished.  HENT:  Head: Normocephalic and atraumatic.  Eyes: Conjunctivae and EOM are normal.  Neck: Normal range of motion.  Cardiovascular: Normal rate, regular rhythm, normal heart sounds and intact distal pulses.   Pulmonary/Chest: Effort normal and breath sounds normal.  Abdominal: Soft. Bowel sounds are normal.  Lymphadenopathy:    She has no cervical adenopathy.  Neurological: She is alert and oriented to person, place, and time.  Skin: Skin is warm and dry. Capillary refill takes less than 2 seconds.  Mild periungual erythema and hyperemia of her hands.  Psychiatric: She has a normal mood and affect. Her behavior is normal.  Nursing note and vitals reviewed.    Musculoskeletal Exam: C-spine and thoracic lumbar spine good range of motion. Shoulder joints elbow joints wrist joint MCPs PIPs DIPs with good range of motion with no synovitis. Hip joints knee joints ankle joints MTPs PIPs with good range of motion with no synovitis. She had tenderness on palpation over bilateral trochanteric bursa area more so on the left side.  CDAI Exam: No CDAI exam completed.    Investigation: Findings:   Autoimmune disease with positive ANA.  All of her autoimmune antibodies are negative except for low titer ANA  In May 2016, ANA was 1:240.    11/01/2015  X-rays of the left hip joint, 2 views, were within normal limits with minimal spurring over the trochanteric bursal area. 05/04/2016 C3, C4, sed rate, urinalysis and ANA with titer normal/ negative ANA   Orders Only on 09/18/2016  Component Date Value Ref  Range Status  . Sodium 09/18/2016 141  135 - 146 mmol/L Final  . Potassium 09/18/2016 4.0  3.5 - 5.3 mmol/L Final  . Chloride 09/18/2016 106  98 - 110 mmol/L Final  . CO2 09/18/2016 26  20 - 31 mmol/L Final  . Glucose, Bld 09/18/2016 88  65 - 99 mg/dL Final  . BUN 09/20/2016 10  7 - 25 mg/dL Final  . Creat 47/97/5678 0.76  0.60 - 0.93 mg/dL Final   Comment:   For patients > or = 70 years of age: The upper reference limit for Creatinine is approximately 13% higher for people identified as African-American.     . Total Bilirubin 09/18/2016 0.4  0.2 - 1.2 mg/dL Final  . Alkaline Phosphatase 09/18/2016 45  33 - 130 U/L Final  . AST 09/18/2016 19  10 - 35 U/L Final  . ALT 09/18/2016 11  6 - 29 U/L Final  . Total Protein 09/18/2016 6.7  6.1 - 8.1 g/dL Final  . Albumin 09/20/2016 4.1  3.6 - 5.1 g/dL Final  . Calcium 91/75/8834 9.5  8.6 - 10.4 mg/dL Final  . GFR,  Est African American 09/18/2016 >89  >=60 mL/min Final  . GFR, Est Non African American 09/18/2016 80  >=60 mL/min Final  . WBC 09/18/2016 5.5  3.8 - 10.8 K/uL Final  . RBC 09/18/2016 4.12  3.80 - 5.10 MIL/uL Final  . Hemoglobin 09/18/2016 13.5  11.7 - 15.5 g/dL Final  . HCT 09/18/2016 40.8  35.0 - 45.0 % Final  . MCV 09/18/2016 99.0  80.0 - 100.0 fL Final  . MCH 09/18/2016 32.8  27.0 - 33.0 pg Final  . MCHC 09/18/2016 33.1  32.0 - 36.0 g/dL Final  . RDW 09/18/2016 13.0  11.0 - 15.0 % Final  . Platelets 09/18/2016 235  140 - 400 K/uL Final  . MPV 09/18/2016 10.4  7.5 - 12.5 fL Final  . Neutro Abs 09/18/2016 3740  1,500 - 7,800 cells/uL Final  . Lymphs Abs 09/18/2016 1045  850 - 3,900 cells/uL Final  . Monocytes Absolute 09/18/2016 440  200 - 950 cells/uL Final  . Eosinophils Absolute 09/18/2016 220  15 - 500 cells/uL Final  . Basophils Absolute 09/18/2016 55  0 - 200 cells/uL Final  . Neutrophils Relative % 09/18/2016 68  % Final  . Lymphocytes Relative 09/18/2016 19  % Final  . Monocytes Relative 09/18/2016 8  % Final  .  Eosinophils Relative 09/18/2016 4  % Final  . Basophils Relative 09/18/2016 1  % Final  . Smear Review 09/18/2016 Criteria for review not met   Final  . Color, Urine 09/19/2016 YELLOW  YELLOW Final  . APPearance 09/19/2016 CLEAR  CLEAR Final  . Specific Gravity, Urine 09/19/2016 1.005  1.001 - 1.035 Final  . pH 09/19/2016 6.5  5.0 - 8.0 Final  . Glucose, UA 09/19/2016 NEGATIVE  NEGATIVE Final  . Bilirubin Urine 09/19/2016 NEGATIVE  NEGATIVE Final  . Ketones, ur 09/19/2016 NEGATIVE  NEGATIVE Final  . Hgb urine dipstick 09/19/2016 NEGATIVE  NEGATIVE Final  . Protein, ur 09/19/2016 NEGATIVE  NEGATIVE Final  . Nitrite 09/19/2016 NEGATIVE  NEGATIVE Final  . Leukocytes, UA 09/19/2016 NEGATIVE  NEGATIVE Final    Imaging: No results found.  Speciality Comments: No specialty comments available.    Procedures:  No procedures performed Allergies: Sulfonamide derivatives   Assessment / Plan:     Visit Diagnoses: Autoimmune disease: (HCC)She has history of positive ANA and Raynaud's phenomena and arthritis which is very well controlled and has no active disease today. She will continue current treatment for now.  Sjogren's syndrome with keratoconjunctivitis sicca ([place]): Controlled with over-the-counter products.  High risk medication use - normal plaquenil eye exam in September 2017 per patient, labs were normal in November 2017 we will check labs again in 5 months., Plaquenil 200 mg by mouth twice a day - Plan: CBC with Differential/Platelet, COMPLETE METABOLIC PANEL WITH GFR  Trochanteric bursitis of both hips: She's been doing exercises at home  Ischial bursitis of left side: Symptoms have improved  Primary osteoarthritis of both hands: Joint protection and muscle strengthening were discussed.  Primary osteoarthritis of both knees : She has minimal discomfort  Left hip joint pain has improved after the cortisone injection.  Patient also has history of depression, anxiety, vitamin D  deficiency, osteoporosis and right shoulder joint tendinopathy. Orders: Orders Placed This Encounter  Procedures  . CBC with Differential/Platelet  . COMPLETE METABOLIC PANEL WITH GFR   No orders of the defined types were placed in this encounter.   Face-to-face time spent with patient was 30 minutes. 50% of time was spent  in counseling and coordination of care.  Follow-Up Instructions: Return in about 5 months (around 03/10/2017) for Sjogren's.   Bo Merino, MD

## 2016-10-10 ENCOUNTER — Encounter: Payer: Self-pay | Admitting: Rheumatology

## 2016-10-10 ENCOUNTER — Ambulatory Visit (INDEPENDENT_AMBULATORY_CARE_PROVIDER_SITE_OTHER): Payer: Medicare Other | Admitting: Rheumatology

## 2016-10-10 VITALS — BP 122/74 | HR 62 | Resp 14 | Ht 67.0 in | Wt 148.0 lb

## 2016-10-10 DIAGNOSIS — Z79899 Other long term (current) drug therapy: Secondary | ICD-10-CM

## 2016-10-10 DIAGNOSIS — M7061 Trochanteric bursitis, right hip: Secondary | ICD-10-CM

## 2016-10-10 DIAGNOSIS — M170 Bilateral primary osteoarthritis of knee: Secondary | ICD-10-CM | POA: Diagnosis not present

## 2016-10-10 DIAGNOSIS — M359 Systemic involvement of connective tissue, unspecified: Secondary | ICD-10-CM

## 2016-10-10 DIAGNOSIS — M3501 Sicca syndrome with keratoconjunctivitis: Secondary | ICD-10-CM

## 2016-10-10 DIAGNOSIS — M7062 Trochanteric bursitis, left hip: Secondary | ICD-10-CM

## 2016-10-10 DIAGNOSIS — E109 Type 1 diabetes mellitus without complications: Secondary | ICD-10-CM | POA: Diagnosis not present

## 2016-10-10 DIAGNOSIS — M19041 Primary osteoarthritis, right hand: Secondary | ICD-10-CM | POA: Diagnosis not present

## 2016-10-10 DIAGNOSIS — M19042 Primary osteoarthritis, left hand: Secondary | ICD-10-CM | POA: Diagnosis not present

## 2016-10-10 DIAGNOSIS — M7072 Other bursitis of hip, left hip: Secondary | ICD-10-CM | POA: Diagnosis not present

## 2016-10-23 ENCOUNTER — Telehealth: Payer: Self-pay | Admitting: Radiology

## 2016-10-23 ENCOUNTER — Encounter: Payer: Self-pay | Admitting: Radiology

## 2016-10-23 DIAGNOSIS — R300 Dysuria: Secondary | ICD-10-CM | POA: Diagnosis not present

## 2016-10-23 DIAGNOSIS — M359 Systemic involvement of connective tissue, unspecified: Secondary | ICD-10-CM | POA: Insufficient documentation

## 2016-10-23 DIAGNOSIS — N952 Postmenopausal atrophic vaginitis: Secondary | ICD-10-CM | POA: Diagnosis not present

## 2016-10-23 NOTE — Telephone Encounter (Signed)
Dr Addison Lank called today to alert [place] to an error in patients problem list, I have corrected this and sent a message to Dr Estanislado Pandy to advise, and asked her to create an addendum to the record to clarify.

## 2016-10-24 ENCOUNTER — Encounter: Payer: Self-pay | Admitting: Rheumatology

## 2016-10-26 ENCOUNTER — Encounter: Payer: Self-pay | Admitting: Rheumatology

## 2016-10-26 ENCOUNTER — Telehealth: Payer: Self-pay | Admitting: Rheumatology

## 2016-10-26 NOTE — Telephone Encounter (Signed)
Patient has a few questions about information that is listed in her My Chart. Please call patient about this.

## 2016-10-26 NOTE — Telephone Encounter (Signed)
Patient had question in regards to tests and immunizations that are coming up overdue on her my chart. Patient provided dates for immunizations and this should help the things from showing up overdue on her my chart.

## 2016-11-13 ENCOUNTER — Other Ambulatory Visit: Payer: Self-pay | Admitting: Rheumatology

## 2016-11-13 DIAGNOSIS — M359 Systemic involvement of connective tissue, unspecified: Secondary | ICD-10-CM

## 2016-11-13 MED ORDER — HYDROXYCHLOROQUINE SULFATE 200 MG PO TABS: 200.0000 mg | ORAL_TABLET | Freq: Two times a day (BID) | ORAL | 1 refills | Status: DC

## 2016-11-13 MED ORDER — ESCITALOPRAM OXALATE 10 MG PO TABS: 10.0000 mg | ORAL_TABLET | Freq: Every day | ORAL | 1 refills | Status: DC

## 2016-11-21 ENCOUNTER — Other Ambulatory Visit: Payer: Self-pay | Admitting: Rheumatology

## 2016-11-21 ENCOUNTER — Ambulatory Visit (AMBULATORY_SURGERY_CENTER): Payer: Self-pay

## 2016-11-21 VITALS — Ht 66.5 in | Wt 162.5 lb

## 2016-11-21 DIAGNOSIS — Z860100 Personal history of colon polyps, unspecified: Secondary | ICD-10-CM

## 2016-11-21 DIAGNOSIS — M359 Systemic involvement of connective tissue, unspecified: Secondary | ICD-10-CM

## 2016-11-21 MED ORDER — SUPREP BOWEL PREP KIT 17.5-3.13-1.6 GM/177ML PO SOLN: 1.0000 | Freq: Once | ORAL | 0 refills | Status: AC

## 2016-11-21 NOTE — Telephone Encounter (Signed)
Last Visit: 10/10/16 Next Visit: 03/11/17 Labs: 10/11/16 WNL PLQ Eye Exam: 07/23/16 WNL  Okay to refill PLQ?

## 2016-11-21 NOTE — Progress Notes (Signed)
No allergies to eggs or soy No past problems with anesthesia No diet meds No home oxygen  Registered for emmi 

## 2016-11-21 NOTE — Telephone Encounter (Signed)
ok 

## 2016-11-26 ENCOUNTER — Telehealth: Payer: Self-pay | Admitting: Gastroenterology

## 2016-11-26 DIAGNOSIS — Z86010 Personal history of colonic polyps: Secondary | ICD-10-CM

## 2016-11-26 MED ORDER — NA SULFATE-K SULFATE-MG SULF 17.5-3.13-1.6 GM/177ML PO SOLN: 1.0000 | Freq: Once | ORAL | 0 refills | Status: AC

## 2016-11-26 NOTE — Telephone Encounter (Signed)
LMOM that prep was sent to pt's pharmacy

## 2016-12-05 ENCOUNTER — Ambulatory Visit (AMBULATORY_SURGERY_CENTER): Payer: Medicare Other | Admitting: Gastroenterology

## 2016-12-05 ENCOUNTER — Encounter: Payer: Self-pay | Admitting: Gastroenterology

## 2016-12-05 VITALS — BP 137/65 | HR 79 | Temp 97.8°F | Resp 16 | Ht 66.5 in | Wt 152.0 lb

## 2016-12-05 DIAGNOSIS — Z86010 Personal history of colonic polyps: Secondary | ICD-10-CM

## 2016-12-05 DIAGNOSIS — Z1211 Encounter for screening for malignant neoplasm of colon: Secondary | ICD-10-CM

## 2016-12-05 DIAGNOSIS — E039 Hypothyroidism, unspecified: Secondary | ICD-10-CM | POA: Diagnosis not present

## 2016-12-05 HISTORY — PX: COLONOSCOPY: SHX174

## 2016-12-05 MED ORDER — SODIUM CHLORIDE 0.9 % IV SOLN: 500.0000 mL | INTRAVENOUS | Status: DC

## 2016-12-05 NOTE — Progress Notes (Signed)
Pt's states no medical or surgical changes since previsit or office visit. 

## 2016-12-05 NOTE — Patient Instructions (Signed)
YOU HAD AN ENDOSCOPIC PROCEDURE TODAY AT [facility]:   Refer to the procedure report that was given to you for any specific questions about what was found during the examination.  If the procedure report does not answer your questions, please call your gastroenterologist to clarify.  If you requested that your care partner not be given the details of your procedure findings, then the procedure report has been included in a sealed envelope for you to review at your convenience later.  YOU SHOULD EXPECT: Some feelings of bloating in the abdomen. Passage of more gas than usual.  Walking can help get rid of the air that was put into your GI tract during the procedure and reduce the bloating. If you had a lower endoscopy (such as a colonoscopy or flexible sigmoidoscopy) you may notice spotting of blood in your stool or on the toilet paper. If you underwent a bowel prep for your procedure, you may not have a normal bowel movement for a few days.  Please Note:  You might notice some irritation and congestion in your nose or some drainage.  This is from the oxygen used during your procedure.  There is no need for concern and it should clear up in a day or so.  SYMPTOMS TO REPORT IMMEDIATELY:   Following lower endoscopy (colonoscopy or flexible sigmoidoscopy):  Excessive amounts of blood in the stool  Significant tenderness or worsening of abdominal pains  Swelling of the abdomen that is new, acute  Fever of 100F or higher  For urgent or emergent issues, a gastroenterologist can be reached at any hour by calling [phone].  DIET:  We do recommend a small meal at first, but then you may proceed to your regular diet.  Drink plenty of fluids but you should avoid alcoholic beverages for 24 hours.  ACTIVITY:  You should plan to take it easy for the rest of today and you should NOT DRIVE or use heavy machinery until tomorrow (because of the sedation medicines used during the test).     FOLLOW UP: Our staff will call the number listed on your records the next business day following your procedure to check on you and address any questions or concerns that you may have regarding the information given to you following your procedure. If we do not reach you, we will leave a message.  However, if you are feeling well and you are not experiencing any problems, there is no need to return our call.  We will assume that you have returned to your regular daily activities without incident.  SIGNATURES/CONFIDENTIALITY: You and/or your care partner have signed paperwork which will be entered into your electronic medical record.  These signatures attest to the fact that that the information above on your After Visit Summary has been reviewed and is understood.  Full responsibility of the confidentiality of this discharge information lies with you and/or your care-partner.  Next colonoscopy- 5 years  Please continue your normal medications

## 2016-12-05 NOTE — Op Note (Signed)
[place] Patient Name: [name] Procedure Date: 12/05/2016 10:39 AM MRN: [id number] Endoscopist: Milus Banister , MD Age: 71 Referring MD:  Date of Birth: 1946-02-04 Gender: Female Account #: [id number] Procedure:                Colonoscopy Indications:              High risk colon cancer surveillance: Personal                            history of colonic polyps; 2012 colonoscopy found                            SSA growing into the appendiceal orifice; s/p 2012                            Laparoscopically assisted partial cecectomy                            including cecal bascule with appendectomy (Dr.                            Johney Maine) Medicines:                Monitored Anesthesia Care Procedure:                Pre-Anesthesia Assessment:                           - Prior to the procedure, a History and Physical                            was performed, and patient medications and                            allergies were reviewed. The patient's tolerance of                            previous anesthesia was also reviewed. The risks                            and benefits of the procedure and the sedation                            options and risks were discussed with the patient.                            All questions were answered, and informed consent                            was obtained. Prior Anticoagulants: The patient has                            taken no previous anticoagulant or antiplatelet  agents. ASA Grade Assessment: II - A patient with                            mild systemic disease. After reviewing the risks                            and benefits, the patient was deemed in                            satisfactory condition to undergo the procedure.                           After obtaining informed consent, the colonoscope                            was passed under direct vision. Throughout the             procedure, the patient's blood pressure, pulse, and                            oxygen saturations were monitored continuously. The                            Model CF-HQ190L [id number]) scope was introduced                            through the anus and advanced to the the cecum,                            identified by its appearance. The colonoscopy was                            performed without difficulty. The patient tolerated                            the procedure well. The quality of the bowel                            preparation was excellent. The site of                            Laparoscopically assisted partial cecectomy                            including cecal was normal appearing. Scope In: 10:43:53 AM Scope Out: 10:55:33 AM Scope Withdrawal Time: 0 hours 8 minutes 26 seconds  Total Procedure Duration: 0 hours 11 minutes 40 seconds  Findings:                 There was evidence of a previous surgery (partial                            cecectomy, see above) in the cecum.  The exam was otherwise without abnormality on                            direct and retroflexion views. Complications:            No immediate complications. Estimated blood loss:                            None. Estimated Blood Loss:     Estimated blood loss: none. Impression:               - Site of 2012 partial cecectomy was normal                            appearing.                           - The examination was otherwise normal on direct                            and retroflexion views.                           - No polyps or cancers. Recommendation:           - Patient has a contact number available for                            emergencies. The signs and symptoms of potential                            delayed complications were discussed with the                            patient. Return to normal activities tomorrow.                             Written discharge instructions were provided to the                            patient.                           - Resume previous diet.                           - Continue present medications.                           - Repeat colonoscopy in 5 years for surveillance. Milus Banister, MD 12/05/2016 11:03:48 AM This report has been signed electronically.

## 2016-12-05 NOTE — Progress Notes (Signed)
Report to PACU, RN, vss, BBS= Clear.  

## 2016-12-06 ENCOUNTER — Telehealth: Payer: Self-pay | Admitting: *Deleted

## 2016-12-06 ENCOUNTER — Telehealth: Payer: Self-pay | Admitting: Gastroenterology

## 2016-12-06 NOTE — Telephone Encounter (Signed)
I fixed this error in our EPIC system and made patient aware of this.

## 2016-12-06 NOTE — Telephone Encounter (Signed)
  Follow up Call-  Call back number 12/05/2016  Post procedure Call Back phone  # [phone]  Permission to leave phone message Yes  Some recent data might be hidden     Patient questions:  Do you have a fever, pain , or abdominal swelling? No. Pain Score  0 *  Have you tolerated food without any problems? Yes.    Have you been able to return to your normal activities? Yes.    Do you have any questions about your discharge instructions: Diet   No. Medications  No. Follow up visit  No.  Do you have questions or concerns about your Care? No.  Actions: * If pain score is 4 or above: No action needed, pain <4.

## 2017-01-31 ENCOUNTER — Telehealth: Payer: Self-pay | Admitting: Rheumatology

## 2017-01-31 NOTE — Telephone Encounter (Signed)
Patient called this morning and is wanting to get set up for the Euflexxa injections.  CB#[phone].  Thank you.

## 2017-02-05 DIAGNOSIS — Z23 Encounter for immunization: Secondary | ICD-10-CM | POA: Diagnosis not present

## 2017-02-05 DIAGNOSIS — M79622 Pain in left upper arm: Secondary | ICD-10-CM | POA: Diagnosis not present

## 2017-02-11 ENCOUNTER — Other Ambulatory Visit: Payer: Self-pay | Admitting: Radiology

## 2017-02-11 DIAGNOSIS — D8989 Other specified disorders involving the immune mechanism, not elsewhere classified: Secondary | ICD-10-CM | POA: Diagnosis not present

## 2017-02-11 DIAGNOSIS — M3210 Systemic lupus erythematosus, organ or system involvement unspecified: Secondary | ICD-10-CM | POA: Diagnosis not present

## 2017-02-11 DIAGNOSIS — Z79899 Other long term (current) drug therapy: Secondary | ICD-10-CM

## 2017-02-11 DIAGNOSIS — M359 Systemic involvement of connective tissue, unspecified: Secondary | ICD-10-CM

## 2017-02-11 LAB — CBC WITH DIFFERENTIAL/PLATELET
BASOS PCT: 1 %
Basophils Absolute: 50 cells/uL (ref 0–200)
EOS ABS: 200 {cells}/uL (ref 15–500)
Eosinophils Relative: 4 %
HCT: 40.9 % (ref 35.0–45.0)
HEMOGLOBIN: 13 g/dL (ref 11.7–15.5)
LYMPHS ABS: 900 {cells}/uL (ref 850–3900)
Lymphocytes Relative: 18 %
MCH: 31.5 pg (ref 27.0–33.0)
MCHC: 31.8 g/dL — ABNORMAL LOW (ref 32.0–36.0)
MCV: 99 fL (ref 80.0–100.0)
MONOS PCT: 9 %
MPV: 10.5 fL (ref 7.5–12.5)
Monocytes Absolute: 450 cells/uL (ref 200–950)
NEUTROS ABS: 3400 {cells}/uL (ref 1500–7800)
Neutrophils Relative %: 68 %
PLATELETS: 257 10*3/uL (ref 140–400)
RBC: 4.13 MIL/uL (ref 3.80–5.10)
RDW: 13 % (ref 11.0–15.0)
WBC: 5 10*3/uL (ref 3.8–10.8)

## 2017-02-11 LAB — COMPLETE METABOLIC PANEL WITH GFR
ALT: 12 U/L (ref 6–29)
AST: 19 U/L (ref 10–35)
Albumin: 3.9 g/dL (ref 3.6–5.1)
Alkaline Phosphatase: 48 U/L (ref 33–130)
BILIRUBIN TOTAL: 0.4 mg/dL (ref 0.2–1.2)
BUN: 12 mg/dL (ref 7–25)
CO2: 28 mmol/L (ref 20–31)
CREATININE: 0.69 mg/dL (ref 0.60–0.93)
Calcium: 9.6 mg/dL (ref 8.6–10.4)
Chloride: 106 mmol/L (ref 98–110)
GFR, Est Non African American: 88 mL/min (ref >=60)
GLUCOSE: 87 mg/dL (ref 65–99)
Potassium: 4.1 mmol/L (ref 3.5–5.3)
SODIUM: 142 mmol/L (ref 135–146)
TOTAL PROTEIN: 6.7 g/dL (ref 6.1–8.1)

## 2017-02-11 NOTE — Telephone Encounter (Signed)
Please call patient and schedule Euflexxa inj. X 3 BIL with Hans on Monday or Wednesday afternoons. Thank you.

## 2017-02-11 NOTE — Telephone Encounter (Signed)
Applied for BIL Euflexxa

## 2017-02-12 ENCOUNTER — Encounter: Payer: Self-pay | Admitting: Rheumatology

## 2017-02-12 LAB — URINALYSIS, ROUTINE W REFLEX MICROSCOPIC
Bilirubin Urine: NEGATIVE
Glucose, UA: NEGATIVE
HGB URINE DIPSTICK: NEGATIVE
KETONES UR: NEGATIVE
NITRITE: NEGATIVE
Protein, ur: NEGATIVE
SPECIFIC GRAVITY, URINE: 1.006 (ref 1.001–1.035)
pH: 7.5 (ref 5.0–8.0)

## 2017-02-12 LAB — URINALYSIS, MICROSCOPIC ONLY
Bacteria, UA: NONE SEEN [HPF]
Casts: NONE SEEN [LPF]
Crystals: NONE SEEN [HPF]
RBC / HPF: NONE SEEN RBC/HPF (ref <=2)
Yeast: NONE SEEN [HPF]

## 2017-02-12 NOTE — Progress Notes (Signed)
WNL

## 2017-02-14 DIAGNOSIS — M1612 Unilateral primary osteoarthritis, left hip: Secondary | ICD-10-CM | POA: Diagnosis not present

## 2017-03-07 NOTE — Progress Notes (Signed)
Office Visit Note  Patient: [name]             Date of Birth: 03-16-1946           MRN: [id number]             PCP: Cari Caraway, MD Referring: Cari Caraway, MD Visit Date: 03/15/2017 Occupation: _0 @    Subjective:  Medication Management   History of Present Illness: [name] is a 71 y.o. female   Last seen 10/10/2016 by Dr. Estanislado Pandy.  Since the last visit, patient is doing well with the lupus. No oral or nasal ulcers. No added fatigue. No new rashes. No flare ovary notes.  Patient has been taking Plaquenil as prescribed at 200 mg twice a day every day. Last blood work was done April 2018 and was normal.  Patient's only complaint today is that she is having pain in her elbow joints bilaterally with left worse than right. Onset was about 2 months ago. No falls or any injuries. She cannot recall any activities that she did that may have exacerbated her elbow joint pain. No history of elbow joint pain in the past. Patient did try Ibuprofen 200 mg, 1 or 2 pills per day depending on how bad she felt, and it helped her some. At worst, pain can be rated 8 on a scale of 0-10 when the left elbow joint is supporting a purse or bag or heavy object in the left arm. When the left arm is not use and patient is at rest, she does not have any pain.  Activities of Daily Living:  Patient reports morning stiffness for 5 minutes.   Patient Reports nocturnal pain.  Difficulty dressing/grooming: Reports Difficulty climbing stairs: Denies Difficulty getting out of chair: Reports Difficulty using hands for taps, buttons, cutlery, and/or writing: Reports   No Rheumatology ROS completed.   PMFS History:  Patient Active Problem List   Diagnosis Date Noted  . Autoimmune disease ([place]) 10/23/2016  . Sjogren's syndrome ([place]) 10/09/2016  . Trochanteric bursitis 10/09/2016  . Ischial bursitis 10/09/2016  . Primary osteoarthritis of both hands 10/09/2016  . Primary  osteoarthritis of both knees 10/09/2016  . High risk medication use 09/12/2016  . SBO (small bowel obstruction), partial postop 10/16/2011  . 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 Allergy   . Colon polyp   . Environmental allergies   . Hypothyroid   . Osteoarthritis   . Osteoarthritis   . Osteoarthritis   . Osteopenia   . Raynauds phenomenon   . 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 Sjogren's syndrome ([place])   . Systemic lupus ([facility])     Family History  Problem Relation Age of Onset  . Heart disease Mother   . Heart disease Brother   . Diabetes Maternal Grandfather   . Cancer Maternal Uncle        colon  . Colon cancer Maternal Uncle 67   Past Surgical History:  Procedure Laterality Date  . APPENDECTOMY    . BUNIONECTOMY  2008   right foot  . COLONOSCOPY W/ POLYPECTOMY  Aug 27, 2011  . ENDOMETRIAL ABLATION    . LAPAROSCOPIC APPENDECTOMY  10/11/2011   Procedure: APPENDECTOMY LAPAROSCOPIC;  Surgeon: Adin Hector, MD;  Location: [facility];  Service: General;  Laterality: N/A;  Partial Cecectomy and Appendectomy  . WISDOM TOOTH EXTRACTION  Social History   Social History Narrative  . No narrative on file     Objective: Vital Signs: BP 122/76   Pulse 72   Resp 14   Wt 152 lb (68.9 kg)   BMI 24.17 kg/m    Physical Exam   Musculoskeletal Exam:  Full range of motion of all joints Grip strength is equal and strong bilaterally For myalgia tender points are all absent  CDAI Exam: No CDAI exam completed.  No synovitis on examination. Pain in the hands coming from OA. Mostly bilateral first MCP and PIP are slightly tender but no swelling (no synovitis).  Investigation: Findings:    ==================  Investigation: Findings:   Autoimmune disease with positive ANA.  All of her autoimmune antibodies are negative except for low titer ANA   In May 2016, ANA was 1:240.    11/01/2015  X-rays of the left hip joint, 2 views, were within normal limits with minimal spurring over the trochanteric bursal area. 05/04/2016 C3, C4, sed rate, urinalysis and ANA with titer normal/ negative ANA ==================  CBC Latest Ref Rng & Units 02/11/2017 09/18/2016 10/15/2011  WBC 3.8 - 10.8 K/uL 5.0 5.5 6.6  Hemoglobin 11.7 - 15.5 g/dL 13.0 13.5 12.9  Hematocrit 35.0 - 45.0 % 40.9 40.8 39.1  Platelets 140 - 400 K/uL 257 235 225    CMP Latest Ref Rng & Units 02/11/2017 09/18/2016 10/15/2011  Glucose 65 - 99 mg/dL 87 88 116(H)  BUN 7 - 25 mg/dL _0 Creatinine 0.60 - 0.93 mg/dL 0.69 0.76 0.61  Sodium 135 - 146 mmol/L 142 141 140  Potassium 3.5 - 5.3 mmol/L 4.1 4.0 3.7  Chloride 98 - 110 mmol/L 106 106 108  CO2 20 - 31 mmol/L _1 Calcium 8.6 - 10.4 mg/dL 9.6 9.5 8.7  Total Protein 6.1 - 8.1 g/dL 6.7 6.7 -  Total Bilirubin 0.2 - 1.2 mg/dL 0.4 0.4 -  Alkaline Phos 33 - 130 U/L 48 45 -  AST 10 - 35 U/L 19 19 -  ALT 6 - 29 U/L 12 11 -   Orders Only on 02/11/2017  Component Date Value Ref Range Status  . WBC 02/11/2017 5.0  3.8 - 10.8 K/uL Final  . RBC 02/11/2017 4.13  3.80 - 5.10 MIL/uL Final  . Hemoglobin 02/11/2017 13.0  11.7 - 15.5 g/dL Final  . HCT 02/11/2017 40.9  35.0 - 45.0 % Final  . MCV 02/11/2017 99.0  80.0 - 100.0 fL Final  . MCH 02/11/2017 31.5  27.0 - 33.0 pg Final  . MCHC 02/11/2017 31.8* 32.0 - 36.0 g/dL Final  . RDW 02/11/2017 13.0  11.0 - 15.0 % Final  . Platelets 02/11/2017 257  140 - 400 K/uL Final  . MPV 02/11/2017 10.5  7.5 - 12.5 fL Final  . Neutro Abs 02/11/2017 3400  1,500 - 7,800 cells/uL Final  . Lymphs Abs 02/11/2017 900  850 - 3,900 cells/uL Final  . Monocytes Absolute 02/11/2017 450  200 - 950 cells/uL Final  . Eosinophils Absolute 02/11/2017 200  15 - 500 cells/uL Final  . Basophils Absolute 02/11/2017 50  0 - 200 cells/uL Final  . Neutrophils Relative % 02/11/2017 68  % Final  . Lymphocytes  Relative 02/11/2017 18  % Final  . Monocytes Relative 02/11/2017 9  % Final  . Eosinophils Relative 02/11/2017 4  % Final  . Basophils Relative 02/11/2017 1  % Final  . Smear Review 02/11/2017 Criteria for review not met  Final  . Sodium 02/11/2017 142  135 - 146 mmol/L Final  . Potassium 02/11/2017 4.1  3.5 - 5.3 mmol/L Final  . Chloride 02/11/2017 106  98 - 110 mmol/L Final  . CO2 02/11/2017 28  20 - 31 mmol/L Final  . Glucose, Bld 02/11/2017 87  65 - 99 mg/dL Final  . BUN 02/11/2017 12  7 - 25 mg/dL Final  . Creat 02/11/2017 0.69  0.60 - 0.93 mg/dL Final   Comment:   For patients > or = 71 years of age: The upper reference limit for Creatinine is approximately 13% higher for people identified as African-American.     . Total Bilirubin 02/11/2017 0.4  0.2 - 1.2 mg/dL Final  . Alkaline Phosphatase 02/11/2017 48  33 - 130 U/L Final  . AST 02/11/2017 19  10 - 35 U/L Final  . ALT 02/11/2017 12  6 - 29 U/L Final  . Total Protein 02/11/2017 6.7  6.1 - 8.1 g/dL Final  . Albumin 02/11/2017 3.9  3.6 - 5.1 g/dL Final  . Calcium 02/11/2017 9.6  8.6 - 10.4 mg/dL Final  . GFR, Est African American 02/11/2017 >89  >=60 mL/min Final  . GFR, Est Non African American 02/11/2017 88  >=60 mL/min Final  . Color, Urine 02/11/2017 YELLOW  YELLOW Final  . APPearance 02/11/2017 CLEAR  CLEAR Final  . Specific Gravity, Urine 02/11/2017 1.006  1.001 - 1.035 Final  . pH 02/11/2017 7.5  5.0 - 8.0 Final  . Glucose, UA 02/11/2017 NEGATIVE  NEGATIVE Final  . Bilirubin Urine 02/11/2017 NEGATIVE  NEGATIVE Final  . Ketones, ur 02/11/2017 NEGATIVE  NEGATIVE Final  . Hgb urine dipstick 02/11/2017 NEGATIVE  NEGATIVE Final  . Protein, ur 02/11/2017 NEGATIVE  NEGATIVE Final  . Nitrite 02/11/2017 NEGATIVE  NEGATIVE Final  . Leukocytes, UA 02/11/2017 2+* NEGATIVE Final  . WBC, UA 02/11/2017 0-5  <=5 WBC/HPF Final   Confirmed by microscopic review.  . RBC / HPF 02/11/2017 NONE SEEN  <=2 RBC/HPF Final  . Squamous  Epithelial / LPF 02/11/2017 0-5  <=5 HPF Final  . Bacteria, UA 02/11/2017 NONE SEEN  NONE SEEN HPF Final  . Crystals 02/11/2017 NONE SEEN  NONE SEEN HPF Final  . Casts 02/11/2017 NONE SEEN  NONE SEEN LPF Final  . Yeast 02/11/2017 NONE SEEN  NONE SEEN HPF Final  Orders Only on 09/18/2016  Component Date Value Ref Range Status  . Sodium 09/18/2016 141  135 - 146 mmol/L Final  . Potassium 09/18/2016 4.0  3.5 - 5.3 mmol/L Final  . Chloride 09/18/2016 106  98 - 110 mmol/L Final  . CO2 09/18/2016 26  20 - 31 mmol/L Final  . Glucose, Bld 09/18/2016 88  65 - 99 mg/dL Final  . BUN 09/18/2016 10  7 - 25 mg/dL Final  . Creat 09/18/2016 0.76  0.60 - 0.93 mg/dL Final   Comment:   For patients > or = 71 years of age: The upper reference limit for Creatinine is approximately 13% higher for people identified as African-American.     . Total Bilirubin 09/18/2016 0.4  0.2 - 1.2 mg/dL Final  . Alkaline Phosphatase 09/18/2016 45  33 - 130 U/L Final  . AST 09/18/2016 19  10 - 35 U/L Final  . ALT 09/18/2016 11  6 - 29 U/L Final  . Total Protein 09/18/2016 6.7  6.1 - 8.1 g/dL Final  . Albumin 09/18/2016 4.1  3.6 - 5.1 g/dL Final  . Calcium 09/18/2016 9.5  8.6 - 10.4 mg/dL Final  . GFR, Est African American 09/18/2016 >89  >=60 mL/min Final  . GFR, Est Non African American 09/18/2016 80  >=60 mL/min Final  . WBC 09/18/2016 5.5  3.8 - 10.8 K/uL Final  . RBC 09/18/2016 4.12  3.80 - 5.10 MIL/uL Final  . Hemoglobin 09/18/2016 13.5  11.7 - 15.5 g/dL Final  . HCT 09/18/2016 40.8  35.0 - 45.0 % Final  . MCV 09/18/2016 99.0  80.0 - 100.0 fL Final  . MCH 09/18/2016 32.8  27.0 - 33.0 pg Final  . MCHC 09/18/2016 33.1  32.0 - 36.0 g/dL Final  . RDW 09/18/2016 13.0  11.0 - 15.0 % Final  . Platelets 09/18/2016 235  140 - 400 K/uL Final  . MPV 09/18/2016 10.4  7.5 - 12.5 fL Final  . Neutro Abs 09/18/2016 3740  1,500 - 7,800 cells/uL Final  . Lymphs Abs 09/18/2016 1045  850 - 3,900 cells/uL Final  . Monocytes  Absolute 09/18/2016 440  200 - 950 cells/uL Final  . Eosinophils Absolute 09/18/2016 220  15 - 500 cells/uL Final  . Basophils Absolute 09/18/2016 55  0 - 200 cells/uL Final  . Neutrophils Relative % 09/18/2016 68  % Final  . Lymphocytes Relative 09/18/2016 19  % Final  . Monocytes Relative 09/18/2016 8  % Final  . Eosinophils Relative 09/18/2016 4  % Final  . Basophils Relative 09/18/2016 1  % Final  . Smear Review 09/18/2016 Criteria for review not met   Final  . Color, Urine 09/18/2016 YELLOW  YELLOW Final  . APPearance 09/18/2016 CLEAR  CLEAR Final  . Specific Gravity, Urine 09/18/2016 1.005  1.001 - 1.035 Final  . pH 09/18/2016 6.5  5.0 - 8.0 Final  . Glucose, UA 09/18/2016 NEGATIVE  NEGATIVE Final  . Bilirubin Urine 09/18/2016 NEGATIVE  NEGATIVE Final  . Ketones, ur 09/18/2016 NEGATIVE  NEGATIVE Final  . Hgb urine dipstick 09/18/2016 NEGATIVE  NEGATIVE Final  . Protein, ur 09/18/2016 NEGATIVE  NEGATIVE Final  . Nitrite 09/18/2016 NEGATIVE  NEGATIVE Final  . Leukocytes, UA 09/18/2016 NEGATIVE  NEGATIVE Final     Imaging: No results found.      Speciality Comments: No specialty comments available.    Procedures:  No procedures performed Allergies: Sulfonamide derivatives   Assessment / Plan:     Visit Diagnoses: Autoimmune disease ([place]) - +ANA  - Plan: hydroxychloroquine (PLAQUENIL) 200 MG tablet  High risk medication use - Mar 15, 2017 ==> PLQ 200 mb bid ; adeq response;   Lateral epicondylitis, left elbow  Pain in left elbow  Primary osteoarthritis of both hands  Ischial bursitis of left side - Resolved after getting injection in the left hip from [place].  Primary osteoarthritis of both knees - 03/15/2017: Patient is scheduled to get Visco supplementation next month to our office.  Sjogren's syndrome with keratoconjunctivitis sicca ([place]) - 03/15/2017: Doing well. No flare.  Autoimmune disease ([place]) - Plan: hydroxychloroquine (PLAQUENIL) 200  MG tablet   Plan: #1: Systemic lupus erythematosus. No flares. Patient is doing well.  #2: High risk prescription. Plaquenil 200 mg twice a day. Patient is on proper dose for her height of 5 foot 7 inches. Patient saw Dr. Gershon Crane, ophthalmologist on 02/11/2017. Note is in Epic. Plaquenil eye exam was normal.  #3: Left lateral epicondylitis. Patient will use Voltaren gel when necessary. We discussed the proper way to use it. Prescription for lateral epicondyle brace. Patient can get it from local pharmacy or medical  supply store or enthesis patient's choice). We also have her new prescription for physical therapy.  #4: CBC with differential CMP with GFR and urinalysis recently done on 02/11/2017 and was normal On the next lab in 4 months, she will do CBC with differential, CMP with GFR, urinalysis. (She can come to our office and get that done. We will also see her in the office at that office visit.  #5: Refill Voltaren gel 3 tubes with 3 refills. (local pharmacy) Refill Lexapro. Ninety-day supply with 1 refill ([facility] Refill Plaquenil 20 mg twice a day, ninety-day supply with 1 refill ([facility]      Orders: No orders of the defined types were placed in this encounter.  Meds ordered this encounter  Medications  . diclofenac sodium (VOLTAREN) 1 % GEL    Sig: Voltaren Gel 3 grams to 3 large joints upto TID 3 TUBES with 3 refills    Dispense:  3 Tube    Refill:  3    Voltaren Gel 3 grams to 3 large joints upto TID 3 TUBES with 3 refills    Order Specific Question:   Supervising Provider    Answer:   Bo Merino [[id number]]  . hydroxychloroquine (PLAQUENIL) 200 MG tablet    Sig: Take 1 tablet (200 mg total) by mouth 2 (two) times daily.    Dispense:  180 tablet    Refill:  1    Order Specific Question:   Supervising Provider    Answer:   Bo Merino [[id number]]  . escitalopram (LEXAPRO) 10 MG tablet    Sig: Take 1 tablet (10 mg total) by mouth  daily.    Dispense:  90 tablet    Refill:  1    Order Specific Question:   Supervising Provider    Answer:   Bo Merino [phone]    Face-to-face time spent with patient was 30 minutes. 50% of time was spent in counseling and coordination of care.  Follow-Up Instructions: Return in about 4 months (around 07/16/2017) for SLE, plq 200 bid, left elbow pain & lateral epicondylitis, oa hands.    Carlyon Shadow, PA-C  Makes him patient had tenderness over left lateral epicondyle area consistent with lateral epicondylitis different treatment options and side effects were discussed. We decided to refer to physical therapy. She will continue current treatment for now. I examined and evaluated the patient with Eliezer Lofts PA. The plan of care was discussed as noted above.  Bo Merino, MD Note - This record has been created using Editor, commissioning.  Chart creation errors have been sought, but may not always  have been located. Such creation errors do not reflect on  the standard of medical care.

## 2017-03-11 ENCOUNTER — Ambulatory Visit: Payer: Medicare Other | Admitting: Rheumatology

## 2017-03-15 ENCOUNTER — Encounter: Payer: Self-pay | Admitting: Rheumatology

## 2017-03-15 ENCOUNTER — Ambulatory Visit (INDEPENDENT_AMBULATORY_CARE_PROVIDER_SITE_OTHER): Payer: Medicare Other | Admitting: Rheumatology

## 2017-03-15 VITALS — BP 122/76 | HR 72 | Resp 14 | Wt 152.0 lb

## 2017-03-15 DIAGNOSIS — M19041 Primary osteoarthritis, right hand: Secondary | ICD-10-CM

## 2017-03-15 DIAGNOSIS — M25522 Pain in left elbow: Secondary | ICD-10-CM

## 2017-03-15 DIAGNOSIS — M3501 Sicca syndrome with keratoconjunctivitis: Secondary | ICD-10-CM | POA: Diagnosis not present

## 2017-03-15 DIAGNOSIS — M19042 Primary osteoarthritis, left hand: Secondary | ICD-10-CM

## 2017-03-15 DIAGNOSIS — M359 Systemic involvement of connective tissue, unspecified: Secondary | ICD-10-CM

## 2017-03-15 DIAGNOSIS — D8989 Other specified disorders involving the immune mechanism, not elsewhere classified: Secondary | ICD-10-CM | POA: Diagnosis not present

## 2017-03-15 DIAGNOSIS — Z79899 Other long term (current) drug therapy: Secondary | ICD-10-CM

## 2017-03-15 DIAGNOSIS — M7072 Other bursitis of hip, left hip: Secondary | ICD-10-CM | POA: Diagnosis not present

## 2017-03-15 DIAGNOSIS — M170 Bilateral primary osteoarthritis of knee: Secondary | ICD-10-CM | POA: Diagnosis not present

## 2017-03-15 DIAGNOSIS — M7712 Lateral epicondylitis, left elbow: Secondary | ICD-10-CM | POA: Diagnosis not present

## 2017-03-15 MED ORDER — ESCITALOPRAM OXALATE 10 MG PO TABS: 10.0000 mg | ORAL_TABLET | Freq: Every day | ORAL | 1 refills | Status: DC

## 2017-03-15 MED ORDER — DICLOFENAC SODIUM 1 % TD GEL: TRANSDERMAL | 3 refills | Status: DC

## 2017-03-15 MED ORDER — HYDROXYCHLOROQUINE SULFATE 200 MG PO TABS: 200.0000 mg | ORAL_TABLET | Freq: Two times a day (BID) | ORAL | 1 refills | Status: DC

## 2017-03-19 DIAGNOSIS — L03012 Cellulitis of left finger: Secondary | ICD-10-CM | POA: Diagnosis not present

## 2017-04-03 ENCOUNTER — Ambulatory Visit (INDEPENDENT_AMBULATORY_CARE_PROVIDER_SITE_OTHER): Payer: Medicare Other | Admitting: Rheumatology

## 2017-04-03 DIAGNOSIS — M25562 Pain in left knee: Secondary | ICD-10-CM

## 2017-04-03 DIAGNOSIS — M170 Bilateral primary osteoarthritis of knee: Secondary | ICD-10-CM | POA: Diagnosis not present

## 2017-04-03 DIAGNOSIS — M25561 Pain in right knee: Secondary | ICD-10-CM

## 2017-04-03 MED ORDER — TRIAMCINOLONE ACETONIDE 40 MG/ML IJ SUSP
40.0000 mg | INTRAMUSCULAR | Status: AC | PRN
  Administered 2017-04-03: 40 mg via INTRA_ARTICULAR

## 2017-04-03 MED ORDER — SODIUM HYALURONATE (VISCOSUP) 20 MG/2ML IX SOSY
20.0000 mg | PREFILLED_SYRINGE | INTRA_ARTICULAR | Status: AC | PRN
Start: 2017-04-03 — End: 2017-04-03
  Administered 2017-04-03: 20 mg via INTRA_ARTICULAR

## 2017-04-03 MED ORDER — LIDOCAINE HCL (PF) 1 % IJ SOLN
2.0000 mL | INTRAMUSCULAR | Status: AC | PRN
  Administered 2017-04-03: 2 mL

## 2017-04-03 MED ORDER — SODIUM HYALURONATE (VISCOSUP) 20 MG/2ML IX SOSY
20.0000 mg | PREFILLED_SYRINGE | INTRA_ARTICULAR | Status: AC | PRN
  Administered 2017-04-03: 20 mg via INTRA_ARTICULAR

## 2017-04-03 NOTE — Progress Notes (Signed)
   Procedure Note  Patient: [name]             Date of Birth: 01-Feb-1946           MRN: [id number]             Visit Date: 04/03/2017  Procedures: Visit Diagnoses: Primary osteoarthritis of both knees - Plan: Large Joint Injection/Arthrocentesis, Large Joint Injection/Arthrocentesis, lidocaine (PF) (XYLOCAINE) 1 % injection 2 mL, lidocaine (PF) (XYLOCAINE) 1 % injection 2 mL, Sodium Hyaluronate SOSY 20 mg, Sodium Hyaluronate SOSY 20 mg, triamcinolone acetonide (KENALOG-40) injection 40 mg  Acute pain of both knees - Plan: Large Joint Injection/Arthrocentesis, Large Joint Injection/Arthrocentesis, lidocaine (PF) (XYLOCAINE) 1 % injection 2 mL, lidocaine (PF) (XYLOCAINE) 1 % injection 2 mL, Sodium Hyaluronate SOSY 20 mg, Sodium Hyaluronate SOSY 20 mg, triamcinolone acetonide (KENALOG-40) injection 40 mg  Large Joint Inj Date/Time: 04/03/2017 3:34 PM Performed by: Eliezer Lofts Authorized by: Eliezer Lofts   Consent Given by:  Patient Site marked: the procedure site was marked   Timeout: prior to procedure the correct patient, procedure, and site was verified   Indications:  Pain and joint swelling Location:  Knee Site:  R knee Prep: patient was prepped and draped in usual sterile fashion   Needle Size:  27 G Needle Length:  1.5 inches Approach:  Medial Ultrasound Guidance: No   Fluoroscopic Guidance: No   Arthrogram: No   Medications:  2 mL lidocaine (PF) 1 %; 20 mg Sodium Hyaluronate 20 MG/2ML; 40 mg triamcinolone acetonide 40 MG/ML Aspiration Attempted: Yes   Aspirate amount (mL):  0 Patient tolerance:  Patient tolerated the procedure well with no immediate complications  Euflex and #1 bilateral knees This is purchased by [place] and BILLED  Lot number for Euflexxa: [id number] a Expiration date 07/15/2017 Product number: [id number] Large Joint Inj Date/Time: 04/03/2017 3:35 PM Performed by: Eliezer Lofts Authorized by: Eliezer Lofts   Consent Given by:   Patient Site marked: the procedure site was marked   Timeout: prior to procedure the correct patient, procedure, and site was verified   Indications:  Pain and joint swelling Location:  Knee Site:  L knee Prep: patient was prepped and draped in usual sterile fashion   Needle Size:  27 G Needle Length:  1.5 inches Approach:  Medial Ultrasound Guidance: No   Fluoroscopic Guidance: No   Arthrogram: No   Medications:  2 mL lidocaine (PF) 1 %; 20 mg Sodium Hyaluronate 20 MG/2ML Aspiration Attempted: Yes   Patient tolerance:  Patient tolerated the procedure well with no immediate complications  Euflex and #1 bilateral knees This is purchased by [place] and BILLED  Lot number for Euflexxa: [id number] a Expiration date 07/15/2017 Product number: [id number]    Patient has had previous series of Visco supplementation and has done well.

## 2017-04-04 DIAGNOSIS — M7062 Trochanteric bursitis, left hip: Secondary | ICD-10-CM | POA: Diagnosis not present

## 2017-04-04 DIAGNOSIS — M7061 Trochanteric bursitis, right hip: Secondary | ICD-10-CM | POA: Diagnosis not present

## 2017-04-04 DIAGNOSIS — M1612 Unilateral primary osteoarthritis, left hip: Secondary | ICD-10-CM | POA: Diagnosis not present

## 2017-04-10 ENCOUNTER — Ambulatory Visit (INDEPENDENT_AMBULATORY_CARE_PROVIDER_SITE_OTHER): Payer: Medicare Other | Admitting: Rheumatology

## 2017-04-10 DIAGNOSIS — M25561 Pain in right knee: Secondary | ICD-10-CM | POA: Diagnosis not present

## 2017-04-10 DIAGNOSIS — M170 Bilateral primary osteoarthritis of knee: Secondary | ICD-10-CM | POA: Diagnosis not present

## 2017-04-10 DIAGNOSIS — M25562 Pain in left knee: Secondary | ICD-10-CM | POA: Diagnosis not present

## 2017-04-10 MED ORDER — LIDOCAINE HCL 1 % IJ SOLN
1.5000 mL | INTRAMUSCULAR | Status: AC | PRN
  Administered 2017-04-10: 1.5 mL

## 2017-04-10 MED ORDER — SODIUM HYALURONATE (VISCOSUP) 20 MG/2ML IX SOSY
20.0000 mg | PREFILLED_SYRINGE | INTRA_ARTICULAR | Status: AC | PRN
  Administered 2017-04-10: 20 mg via INTRA_ARTICULAR

## 2017-04-10 NOTE — Progress Notes (Signed)
   Procedure Note  Patient: [name]             Date of Birth: 12-07-1945           MRN: [id number]             Visit Date: 04/10/2017  Procedures: Visit Diagnoses: Primary osteoarthritis of both knees  Acute pain of both knees  Large Joint Inj Date/Time: 04/10/2017 2:18 PM Performed by: Eliezer Lofts Authorized by: Eliezer Lofts   Consent Given by:  Patient Site marked: the procedure site was marked   Timeout: prior to procedure the correct patient, procedure, and site was verified   Indications:  Pain and joint swelling Location:  Knee Site:  R knee Prep: patient was prepped and draped in usual sterile fashion   Needle Size:  27 G Needle Length:  1.5 inches Approach:  Medial Ultrasound Guidance: No   Fluoroscopic Guidance: No   Arthrogram: No   Medications:  1.5 mL lidocaine 1 %; 20 mg Sodium Hyaluronate 20 MG/2ML Aspiration Attempted: Yes   Patient tolerance:  Patient tolerated the procedure well with no immediate complications Large Joint Inj Date/Time: 04/10/2017 2:20 PM Performed by: Eliezer Lofts Authorized by: Eliezer Lofts   Consent Given by:  Patient Site marked: the procedure site was marked   Timeout: prior to procedure the correct patient, procedure, and site was verified   Indications:  Pain and joint swelling Location:  Knee Site:  L knee Prep: patient was prepped and draped in usual sterile fashion   Needle Size:  27 G Needle Length:  1.5 inches Approach:  Medial Ultrasound Guidance: No   Fluoroscopic Guidance: No   Arthrogram: No   Medications:  1.5 mL lidocaine 1 %; 20 mg Sodium Hyaluronate 20 MG/2ML Aspiration Attempted: Yes   Patient tolerance:  Patient tolerated the procedure well with no immediate complications  Euflexxa No. 2 bilateral knees This is purchased by clinic and we will bill patient  Patient is about 30% better after her first injection. She has responded very well to Euflexxa.

## 2017-04-15 ENCOUNTER — Other Ambulatory Visit: Payer: Self-pay | Admitting: Family Medicine

## 2017-04-15 DIAGNOSIS — M170 Bilateral primary osteoarthritis of knee: Secondary | ICD-10-CM

## 2017-04-15 DIAGNOSIS — Z1231 Encounter for screening mammogram for malignant neoplasm of breast: Secondary | ICD-10-CM

## 2017-04-15 NOTE — Progress Notes (Signed)
   Procedure Note  Patient: [name]             Date of Birth: 1946/07/14           MRN: [id number]             Visit Date: 04/17/2017  Procedures: Visit Diagnoses: Primary osteoarthritis of both knees - Plan: Large Joint Injection/Arthrocentesis, Large Joint Injection/Arthrocentesis  Patient is here to receive the third Euflexxa injection to bilateral knee joints. Large Joint Inj Date/Time: 04/15/2017 3:59 PM Performed by: Bo Merino Authorized by: Bo Merino   Consent Given by:  Patient Site marked: the procedure site was marked   Timeout: prior to procedure the correct patient, procedure, and site was verified   Indications:  Pain and joint swelling Location:  Knee Site:  L knee Prep: patient was prepped and draped in usual sterile fashion   Needle Size:  27 G Needle Length:  1.5 inches Approach:  Medial Ultrasound Guidance: No   Fluoroscopic Guidance: No   Arthrogram: No   Medications:  2 mL lidocaine 2 %; 20 mg Sodium Hyaluronate 20 MG/2ML Aspiration Attempted: Yes   Patient tolerance:  Patient tolerated the procedure well with no immediate complications Large Joint Inj Date/Time: 04/15/2017 4:00 PM Performed by: Bo Merino Authorized by: Bo Merino   Consent Given by:  Patient Site marked: the procedure site was marked   Timeout: prior to procedure the correct patient, procedure, and site was verified   Indications:  Pain and joint swelling Location:  Knee Site:  R knee Prep: patient was prepped and draped in usual sterile fashion   Needle Size:  27 G Needle Length:  1.5 inches Approach:  Medial Ultrasound Guidance: No   Fluoroscopic Guidance: No   Arthrogram: No   Medications:  20 mg Sodium Hyaluronate 20 MG/2ML; 2 mL lidocaine 2 % Aspiration Attempted: Yes   Patient tolerance:  Patient tolerated the procedure well with no immediate complications    Bo Merino, MD

## 2017-04-16 DIAGNOSIS — M25551 Pain in right hip: Secondary | ICD-10-CM | POA: Diagnosis not present

## 2017-04-16 DIAGNOSIS — M25552 Pain in left hip: Secondary | ICD-10-CM | POA: Diagnosis not present

## 2017-04-17 ENCOUNTER — Ambulatory Visit (INDEPENDENT_AMBULATORY_CARE_PROVIDER_SITE_OTHER): Payer: Medicare Other | Admitting: Rheumatology

## 2017-04-17 DIAGNOSIS — M170 Bilateral primary osteoarthritis of knee: Secondary | ICD-10-CM

## 2017-04-17 MED ORDER — SODIUM HYALURONATE (VISCOSUP) 20 MG/2ML IX SOSY
20.0000 mg | PREFILLED_SYRINGE | INTRA_ARTICULAR | Status: AC | PRN
  Administered 2017-04-15: 20 mg via INTRA_ARTICULAR

## 2017-04-17 MED ORDER — LIDOCAINE HCL 2 % IJ SOLN
2.0000 mL | INTRAMUSCULAR | Status: AC | PRN
  Administered 2017-04-15: 2 mL

## 2017-04-23 DIAGNOSIS — M25552 Pain in left hip: Secondary | ICD-10-CM | POA: Diagnosis not present

## 2017-04-23 DIAGNOSIS — M25551 Pain in right hip: Secondary | ICD-10-CM | POA: Diagnosis not present

## 2017-04-25 DIAGNOSIS — M25552 Pain in left hip: Secondary | ICD-10-CM | POA: Diagnosis not present

## 2017-04-25 DIAGNOSIS — M25551 Pain in right hip: Secondary | ICD-10-CM | POA: Diagnosis not present

## 2017-04-30 DIAGNOSIS — M25552 Pain in left hip: Secondary | ICD-10-CM | POA: Diagnosis not present

## 2017-04-30 DIAGNOSIS — M25551 Pain in right hip: Secondary | ICD-10-CM | POA: Diagnosis not present

## 2017-05-02 DIAGNOSIS — M25551 Pain in right hip: Secondary | ICD-10-CM | POA: Diagnosis not present

## 2017-05-02 DIAGNOSIS — M25552 Pain in left hip: Secondary | ICD-10-CM | POA: Diagnosis not present

## 2017-05-07 DIAGNOSIS — M25551 Pain in right hip: Secondary | ICD-10-CM | POA: Diagnosis not present

## 2017-05-07 DIAGNOSIS — M25552 Pain in left hip: Secondary | ICD-10-CM | POA: Diagnosis not present

## 2017-05-09 DIAGNOSIS — M25551 Pain in right hip: Secondary | ICD-10-CM | POA: Diagnosis not present

## 2017-05-09 DIAGNOSIS — M25552 Pain in left hip: Secondary | ICD-10-CM | POA: Diagnosis not present

## 2017-05-14 DIAGNOSIS — M25552 Pain in left hip: Secondary | ICD-10-CM | POA: Diagnosis not present

## 2017-05-14 DIAGNOSIS — M25551 Pain in right hip: Secondary | ICD-10-CM | POA: Diagnosis not present

## 2017-05-16 DIAGNOSIS — M25552 Pain in left hip: Secondary | ICD-10-CM | POA: Diagnosis not present

## 2017-05-16 DIAGNOSIS — M25551 Pain in right hip: Secondary | ICD-10-CM | POA: Diagnosis not present

## 2017-05-20 DIAGNOSIS — M25552 Pain in left hip: Secondary | ICD-10-CM | POA: Diagnosis not present

## 2017-05-20 DIAGNOSIS — M25551 Pain in right hip: Secondary | ICD-10-CM | POA: Diagnosis not present

## 2017-05-23 DIAGNOSIS — M7062 Trochanteric bursitis, left hip: Secondary | ICD-10-CM | POA: Diagnosis not present

## 2017-05-23 DIAGNOSIS — M7061 Trochanteric bursitis, right hip: Secondary | ICD-10-CM | POA: Diagnosis not present

## 2017-05-30 ENCOUNTER — Ambulatory Visit: Payer: Medicare Other

## 2017-06-05 ENCOUNTER — Ambulatory Visit
Admission: RE | Admit: 2017-06-05 | Discharge: 2017-06-05 | Disposition: A | Discharge status: Home / Self Care | Payer: Medicare Other | Source: Ambulatory Visit | Attending: Family Medicine | Admitting: Family Medicine

## 2017-06-05 DIAGNOSIS — Z1231 Encounter for screening mammogram for malignant neoplasm of breast: Secondary | ICD-10-CM | POA: Diagnosis not present

## 2017-06-28 DIAGNOSIS — M79671 Pain in right foot: Secondary | ICD-10-CM | POA: Diagnosis not present

## 2017-06-28 DIAGNOSIS — T8484XA Pain due to internal orthopedic prosthetic devices, implants and grafts, initial encounter: Secondary | ICD-10-CM | POA: Diagnosis not present

## 2017-06-28 DIAGNOSIS — G8929 Other chronic pain: Secondary | ICD-10-CM | POA: Diagnosis not present

## 2017-06-28 DIAGNOSIS — M2021 Hallux rigidus, right foot: Secondary | ICD-10-CM | POA: Diagnosis not present

## 2017-07-08 NOTE — Progress Notes (Deleted)
Office Visit Note  Patient: [name]             Date of Birth: 1946-07-09           MRN: [id number]             PCP: Cari Caraway, MD Referring: Cari Caraway, MD Visit Date: 07/18/2017 Occupation: @GUAROCC @    Subjective:  No chief complaint on file.   History of Present Illness: [name] is a 71 y.o. female ***   Activities of Daily Living:  Patient reports morning stiffness for *** {minute/hour:19697}.   Patient {ACTIONS;DENIES/REPORTS:21021675::"Denies"} nocturnal pain.  Difficulty dressing/grooming: {ACTIONS;DENIES/REPORTS:21021675::"Denies"} Difficulty climbing stairs: {ACTIONS;DENIES/REPORTS:21021675::"Denies"} Difficulty getting out of chair: {ACTIONS;DENIES/REPORTS:21021675::"Denies"} Difficulty using hands for taps, buttons, cutlery, and/or writing: {ACTIONS;DENIES/REPORTS:21021675::"Denies"}   No Rheumatology ROS completed.   PMFS History:  Patient Active Problem List   Diagnosis Date Noted  . Autoimmune disease ([place]) 10/23/2016  . Sjogren's syndrome ([place]) 10/09/2016  . Trochanteric bursitis 10/09/2016  . Ischial bursitis 10/09/2016  . Primary osteoarthritis of both hands 10/09/2016  . Primary osteoarthritis of both knees 10/09/2016  . High risk medication use 09/12/2016  . SBO (small bowel obstruction), partial postop 10/16/2011  . 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 Allergy   . Colon polyp   . Environmental allergies   . Hypothyroid   . Osteoarthritis   . Osteoarthritis   . Osteoarthritis   . Osteopenia   . Raynauds phenomenon   . 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 Sjogren's syndrome ([place])   . Systemic lupus ([facility])     Family History  Problem Relation Age of Onset  . Heart disease Mother   . Heart disease Brother   . Diabetes Maternal Grandfather   . Cancer Maternal Uncle        colon  . Colon cancer  Maternal Uncle 56   Past Surgical History:  Procedure Laterality Date  . APPENDECTOMY    . BUNIONECTOMY  2008   right foot  . COLONOSCOPY W/ POLYPECTOMY  Aug 27, 2011  . ENDOMETRIAL ABLATION    . LAPAROSCOPIC APPENDECTOMY  10/11/2011   Procedure: APPENDECTOMY LAPAROSCOPIC;  Surgeon: Adin Hector, MD;  Location: [facility];  Service: General;  Laterality: N/A;  Partial Cecectomy and Appendectomy  . WISDOM TOOTH EXTRACTION     Social History   Social History Narrative  . No narrative on file     Objective: Vital Signs: There were no vitals taken for this visit.   Physical Exam   Musculoskeletal Exam: ***  CDAI Exam: No CDAI exam completed.    Investigation: No additional findings.PLQ eye exam: 01/2017 CBC Latest Ref Rng & Units 02/11/2017 09/18/2016 10/15/2011  WBC 3.8 - 10.8 K/uL 5.0 5.5 6.6  Hemoglobin 11.7 - 15.5 g/dL 13.0 13.5 12.9  Hematocrit 35.0 - 45.0 % 40.9 40.8 39.1  Platelets 140 - 400 K/uL 257 235 225   CMP Latest Ref Rng & Units 02/11/2017 09/18/2016 10/15/2011  Glucose 65 - 99 mg/dL 87 88 116(H)  BUN 7 - 25 mg/dL 12 10 6   Creatinine 0.60 - 0.93 mg/dL 0.69 0.76 0.61  Sodium 135 - 146 mmol/L 142 141 140  Potassium 3.5 - 5.3 mmol/L 4.1 4.0 3.7  Chloride 98 - 110 mmol/L 106 106 108  CO2 20 - 31 mmol/L 28 26 27   Calcium 8.6 - 10.4 mg/dL 9.6 9.5 8.7  Total Protein 6.1 - 8.1 g/dL 6.7 6.7 -  Total Bilirubin 0.2 - 1.2 mg/dL 0.4 0.4 -  Alkaline Phos 33 - 130 U/L 48 45 -  AST 10 - 35 U/L 19 19 -  ALT 6 - 29 U/L 12 11 -    Imaging: No results found.  Speciality Comments: No specialty comments available.    Procedures:  No procedures performed Allergies: Sulfonamide derivatives   Assessment / Plan:     Visit Diagnoses: No diagnosis found.    Orders: No orders of the defined types were placed in this encounter.  No orders of the defined types were placed in this encounter.   Face-to-face time spent with patient was *** minutes. 50% of time was  spent in counseling and coordination of care.  Follow-Up Instructions: No Follow-up on file.   Earnestine Mealing, NT  Note - This record has been created using Editor, commissioning.  Chart creation errors have been sought, but may not always  have been located. Such creation errors do not reflect on  the standard of medical care.

## 2017-07-11 DIAGNOSIS — Z23 Encounter for immunization: Secondary | ICD-10-CM | POA: Diagnosis not present

## 2017-07-12 DIAGNOSIS — N898 Other specified noninflammatory disorders of vagina: Secondary | ICD-10-CM | POA: Diagnosis not present

## 2017-07-16 ENCOUNTER — Ambulatory Visit: Payer: Medicare Other | Admitting: Rheumatology

## 2017-07-18 ENCOUNTER — Ambulatory Visit: Payer: Medicare Other | Admitting: Rheumatology

## 2017-08-07 ENCOUNTER — Other Ambulatory Visit: Payer: Self-pay | Admitting: *Deleted

## 2017-08-07 DIAGNOSIS — M2550 Pain in unspecified joint: Secondary | ICD-10-CM | POA: Diagnosis not present

## 2017-08-07 DIAGNOSIS — R5383 Other fatigue: Secondary | ICD-10-CM

## 2017-08-07 DIAGNOSIS — Z79899 Other long term (current) drug therapy: Secondary | ICD-10-CM | POA: Diagnosis not present

## 2017-08-08 LAB — URINALYSIS, ROUTINE W REFLEX MICROSCOPIC
BILIRUBIN URINE: NEGATIVE
GLUCOSE, UA: NEGATIVE
HYALINE CAST: NONE SEEN /LPF
Hgb urine dipstick: NEGATIVE
Ketones, ur: NEGATIVE
NITRITE: NEGATIVE
PH: 6 (ref 5.0–8.0)
PROTEIN: NEGATIVE
RBC / HPF: NONE SEEN /HPF (ref 0–2)
SPECIFIC GRAVITY, URINE: 1.004 (ref 1.001–1.03)
Squamous Epithelial / LPF: NONE SEEN /HPF (ref <=5)

## 2017-08-08 LAB — COMPLETE METABOLIC PANEL WITH GFR
AG RATIO: 1.5 (calc) (ref 1.0–2.5)
ALBUMIN MSPROF: 4 g/dL (ref 3.6–5.1)
ALKALINE PHOSPHATASE (APISO): 50 U/L (ref 33–130)
ALT: 9 U/L (ref 6–29)
AST: 16 U/L (ref 10–35)
BUN: 13 mg/dL (ref 7–25)
CO2: 29 mmol/L (ref 20–32)
CREATININE: 0.73 mg/dL (ref 0.60–0.93)
Calcium: 9.3 mg/dL (ref 8.6–10.4)
Chloride: 107 mmol/L (ref 98–110)
GFR, EST NON AFRICAN AMERICAN: 83 mL/min/{1.73_m2} (ref >=60)
GFR, Est African American: 96 mL/min/{1.73_m2} (ref >=60)
GLOBULIN: 2.6 g/dL (ref 1.9–3.7)
Glucose, Bld: 90 mg/dL (ref 65–99)
POTASSIUM: 4 mmol/L (ref 3.5–5.3)
SODIUM: 142 mmol/L (ref 135–146)
Total Bilirubin: 0.4 mg/dL (ref 0.2–1.2)
Total Protein: 6.6 g/dL (ref 6.1–8.1)

## 2017-08-08 LAB — CBC WITH DIFFERENTIAL/PLATELET
BASOS PCT: 0.9 %
Basophils Absolute: 51 cells/uL (ref 0–200)
EOS PCT: 3.9 %
Eosinophils Absolute: 222 cells/uL (ref 15–500)
HEMATOCRIT: 38.4 % (ref 35.0–45.0)
Hemoglobin: 13 g/dL (ref 11.7–15.5)
LYMPHS ABS: 958 {cells}/uL (ref 850–3900)
MCH: 32.7 pg (ref 27.0–33.0)
MCHC: 33.9 g/dL (ref 32.0–36.0)
MCV: 96.5 fL (ref 80.0–100.0)
MPV: 11 fL (ref 7.5–12.5)
Monocytes Relative: 10.5 %
NEUTROS ABS: 3870 {cells}/uL (ref 1500–7800)
NEUTROS PCT: 67.9 %
Platelets: 230 10*3/uL (ref 140–400)
RBC: 3.98 10*6/uL (ref 3.80–5.10)
RDW: 11.8 % (ref 11.0–15.0)
Total Lymphocyte: 16.8 %
WBC: 5.7 10*3/uL (ref 3.8–10.8)
WBCMIX: 599 {cells}/uL (ref 200–950)

## 2017-08-08 NOTE — Progress Notes (Signed)
She should see PCP if she is having symptoms of UTI.

## 2017-08-11 NOTE — Progress Notes (Deleted)
Office Visit Note  Patient: [name]             Date of Birth: Feb 19, 1946           MRN: [id number]             PCP: Cari Caraway, MD Referring: Cari Caraway, MD Visit Date: 08/22/2017 Occupation: @GUAROCC @    Subjective:  No chief complaint on file.   History of Present Illness: [name] is a 71 y.o. female ***   Activities of Daily Living:  Patient reports morning stiffness for *** {minute/hour:19697}.   Patient {ACTIONS;DENIES/REPORTS:21021675::"Denies"} nocturnal pain.  Difficulty dressing/grooming: {ACTIONS;DENIES/REPORTS:21021675::"Denies"} Difficulty climbing stairs: {ACTIONS;DENIES/REPORTS:21021675::"Denies"} Difficulty getting out of chair: {ACTIONS;DENIES/REPORTS:21021675::"Denies"} Difficulty using hands for taps, buttons, cutlery, and/or writing: {ACTIONS;DENIES/REPORTS:21021675::"Denies"}   No Rheumatology ROS completed.   PMFS History:  Patient Active Problem List   Diagnosis Date Noted  . Autoimmune disease ([place]) 10/23/2016  . Sjogren's syndrome ([place]) 10/09/2016  . Trochanteric bursitis 10/09/2016  . Ischial bursitis 10/09/2016  . Primary osteoarthritis of both hands 10/09/2016  . Primary osteoarthritis of both knees 10/09/2016  . High risk medication use 09/12/2016  . SBO (small bowel obstruction), partial postop 10/16/2011  . 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 Allergy   . Colon polyp   . Environmental allergies   . Hypothyroid   . Osteoarthritis   . Osteoarthritis   . Osteoarthritis   . Osteopenia   . Raynauds phenomenon   . 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 Sjogren's syndrome ([place])   . Systemic lupus ([facility])     Family History  Problem Relation Age of Onset  . Heart disease Mother   . Heart disease Brother   . Diabetes Maternal Grandfather   . Cancer Maternal Uncle        colon  . Colon cancer  Maternal Uncle 66   Past Surgical History:  Procedure Laterality Date  . APPENDECTOMY    . BUNIONECTOMY  2008   right foot  . COLONOSCOPY W/ POLYPECTOMY  Aug 27, 2011  . ENDOMETRIAL ABLATION    . LAPAROSCOPIC APPENDECTOMY  10/11/2011   Procedure: APPENDECTOMY LAPAROSCOPIC;  Surgeon: Adin Hector, MD;  Location: [facility];  Service: General;  Laterality: N/A;  Partial Cecectomy and Appendectomy  . WISDOM TOOTH EXTRACTION     Social History   Social History Narrative  . No narrative on file     Objective: Vital Signs: There were no vitals taken for this visit.   Physical Exam   Musculoskeletal Exam: ***  CDAI Exam: No CDAI exam completed.    Investigation: No additional findings.PLQ eye exam? CBC Latest Ref Rng & Units 08/07/2017 02/11/2017 09/18/2016  WBC 3.8 - 10.8 Thousand/uL 5.7 5.0 5.5  Hemoglobin 11.7 - 15.5 g/dL 13.0 13.0 13.5  Hematocrit 35.0 - 45.0 % 38.4 40.9 40.8  Platelets 140 - 400 Thousand/uL 230 257 235   CMP Latest Ref Rng & Units 08/07/2017 02/11/2017 09/18/2016  Glucose 65 - 99 mg/dL 90 87 88  BUN 7 - 25 mg/dL 13 12 10   Creatinine 0.60 - 0.93 mg/dL 0.73 0.69 0.76  Sodium 135 - 146 mmol/L 142 142 141  Potassium 3.5 - 5.3 mmol/L 4.0 4.1 4.0  Chloride 98 - 110 mmol/L 107 106 106  CO2 20 - 32 mmol/L 29 28 26   Calcium 8.6 - 10.4 mg/dL 9.3 9.6 9.5  Total  Protein 6.1 - 8.1 g/dL 6.6 6.7 6.7  Total Bilirubin 0.2 - 1.2 mg/dL 0.4 0.4 0.4  Alkaline Phos 33 - 130 U/L - 48 45  AST 10 - 35 U/L 16 19 19   ALT 6 - 29 U/L 9 12 11     Imaging: No results found.  Speciality Comments: No specialty comments available.    Procedures:  No procedures performed Allergies: Sulfonamide derivatives   Assessment / Plan:     Visit Diagnoses: No diagnosis found.    Orders: No orders of the defined types were placed in this encounter.  No orders of the defined types were placed in this encounter.   Face-to-face time spent with patient was *** minutes. 50% of time  was spent in counseling and coordination of care.  Follow-Up Instructions: No Follow-up on file.   Earnestine Mealing, NT  Note - This record has been created using Editor, commissioning.  Chart creation errors have been sought, but may not always  have been located. Such creation errors do not reflect on  the standard of medical care.

## 2017-08-12 ENCOUNTER — Telehealth: Payer: Self-pay | Admitting: Rheumatology

## 2017-08-12 NOTE — Telephone Encounter (Signed)
See other telephone encounter.

## 2017-08-12 NOTE — Telephone Encounter (Signed)
Attempted to contact the patient and left message for patient to call the office.  

## 2017-08-12 NOTE — Telephone Encounter (Signed)
Patient returning your call from Friday.

## 2017-08-12 NOTE — Telephone Encounter (Signed)
Patient returned your call.  CB#[phone]

## 2017-08-12 NOTE — Telephone Encounter (Signed)
Patient advised of lab results and verbalized understanding.  

## 2017-08-20 ENCOUNTER — Ambulatory Visit: Payer: Medicare Other | Admitting: Rheumatology

## 2017-08-20 NOTE — Progress Notes (Signed)
Office Visit Note  Patient: [name]             Date of Birth: 1945-12-26           MRN: [id number]             PCP: Cari Caraway, MD Referring: Cari Caraway, MD Visit Date: 08/22/2017 Occupation: @GUAROCC @    Subjective:  Pain left knee.   History of Present Illness: [name] is a 71 y.o. female with history of Sjogren's and osteoarthritis. She continues to have some sicca symptoms for which she's been using over-the-counter products. She has osteoarthritis in her bilateral knee joints. She has had chronic pain in her right knee joint. Recently she's been having increased pain in her left knee joint. Her hands are doing well. Her trochanteric bursitis is much improved after physical therapy. This showed bursitis is completely resolved. Her right shoulder is improved as well.  Activities of Daily Living:  Patient reports morning stiffness for 2 minutes.   Patient Denies nocturnal pain.  Difficulty dressing/grooming: Denies Difficulty climbing stairs: Denies Difficulty getting out of chair: Denies Difficulty using hands for taps, buttons, cutlery, and/or writing: Denies   Review of Systems  Constitutional: Negative for fatigue, night sweats, weight gain, weight loss and weakness.  HENT: Positive for mouth dryness. Negative for mouth sores, trouble swallowing, trouble swallowing and nose dryness.   Eyes: Positive for dryness. Negative for pain, redness and visual disturbance.  Respiratory: Negative for cough, shortness of breath and difficulty breathing.   Cardiovascular: Negative for chest pain, palpitations, hypertension, irregular heartbeat and swelling in legs/feet.  Gastrointestinal: Negative for blood in stool, constipation and diarrhea.  Endocrine: Negative for increased urination.  Genitourinary: Negative for vaginal dryness.  Musculoskeletal: Positive for arthralgias, joint pain and morning stiffness. Negative for joint swelling, myalgias, muscle  weakness, muscle tenderness and myalgias.  Skin: Negative for color change, rash, hair loss, skin tightness, ulcers and sensitivity to sunlight.  Allergic/Immunologic: Negative for susceptible to infections.  Neurological: Negative for dizziness, memory loss and night sweats.  Hematological: Negative for swollen glands.  Psychiatric/Behavioral: Negative for depressed mood and sleep disturbance. The patient is not nervous/anxious.     PMFS History:  Patient Active Problem List   Diagnosis Date Noted  . Osteopenia of multiple sites 08/22/2017  . History of vitamin D deficiency 08/22/2017  . Autoimmune disease ([place]) 10/23/2016  . Sjogren's syndrome ([place]) 10/09/2016  . Primary osteoarthritis of both hands 10/09/2016  . Primary osteoarthritis of both knees 10/09/2016  . High risk medication use 09/12/2016  . SBO (small bowel obstruction), partial postop 10/16/2011  . 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 Allergy   . Colon polyp   . Environmental allergies   . Hypothyroid   . Osteoarthritis   . Osteoarthritis   . Osteoarthritis   . Osteopenia   . Raynauds phenomenon   . 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 Sjogren's syndrome ([place])   . Systemic lupus ([facility])     Family History  Problem Relation Age of Onset  . Heart disease Mother   . Heart disease Brother   . Diabetes Maternal Grandfather   . Cancer Maternal Uncle        colon  . Colon cancer Maternal Uncle 8   Past Surgical History:  Procedure Laterality Date  . APPENDECTOMY    . BUNIONECTOMY  2008  right foot  . COLONOSCOPY W/ POLYPECTOMY  Aug 27, 2011  . ENDOMETRIAL ABLATION    . LAPAROSCOPIC APPENDECTOMY  10/11/2011   Procedure: APPENDECTOMY LAPAROSCOPIC;  Surgeon: Adin Hector, MD;  Location: [facility];  Service: General;  Laterality: N/A;  Partial Cecectomy and Appendectomy  . WISDOM TOOTH  EXTRACTION     Social History   Social History Narrative  . No narrative on file     Objective: Vital Signs: BP 128/66 (BP Location: Left Arm, Patient Position: Sitting, Cuff Size: Normal)   Pulse 63   Ht 5\' 7"  (1.702 m)   Wt 155 lb (70.3 kg)   BMI 24.28 kg/m    Physical Exam  Constitutional: She is oriented to person, place, and time. She appears well-developed and well-nourished.  HENT:  Head: Normocephalic and atraumatic.  Eyes: Conjunctivae and EOM are normal.  Neck: Normal range of motion.  Cardiovascular: Normal rate, regular rhythm, normal heart sounds and intact distal pulses.   Pulmonary/Chest: Effort normal and breath sounds normal.  Abdominal: Soft. Bowel sounds are normal.  Lymphadenopathy:    She has no cervical adenopathy.  Neurological: She is alert and oriented to person, place, and time.  Skin: Skin is warm and dry. Capillary refill takes 2 to 3 seconds.  Psychiatric: She has a normal mood and affect. Her behavior is normal.  Nursing note and vitals reviewed.    Musculoskeletal Exam: C-spine and thoracic lumbar spine good range of motion. Shoulder joints elbow joints wrist joint MCPs PIPs DIPs with good range of motion. Hip joints knee joints ankles MTPs PIPs DIPs with good range of motion with no synovitis. She is some crepitus with range of motion of bilateral knee joints.  CDAI Exam: CDAI Homunculus Exam:   Joint Counts:  CDAI Tender Joint count: 0 CDAI Swollen Joint count: 0     Investigation: No additional findings.PLQ eye exam? CBC Latest Ref Rng & Units 08/07/2017 02/11/2017 09/18/2016  WBC 3.8 - 10.8 Thousand/uL 5.7 5.0 5.5  Hemoglobin 11.7 - 15.5 g/dL 13.0 13.0 13.5  Hematocrit 35.0 - 45.0 % 38.4 40.9 40.8  Platelets 140 - 400 Thousand/uL 230 257 235   CMP Latest Ref Rng & Units 08/07/2017 02/11/2017 09/18/2016  Glucose 65 - 99 mg/dL 90 87 88  BUN 7 - 25 mg/dL 13 12 10   Creatinine 0.60 - 0.93 mg/dL 0.73 0.69 0.76  Sodium 135 - 146  mmol/L 142 142 141  Potassium 3.5 - 5.3 mmol/L 4.0 4.1 4.0  Chloride 98 - 110 mmol/L 107 106 106  CO2 20 - 32 mmol/L 29 28 26   Calcium 8.6 - 10.4 mg/dL 9.3 9.6 9.5  Total Protein 6.1 - 8.1 g/dL 6.6 6.7 6.7  Total Bilirubin 0.2 - 1.2 mg/dL 0.4 0.4 0.4  Alkaline Phos 33 - 130 U/L - 48 45  AST 10 - 35 U/L 16 19 19   ALT 6 - 29 U/L 9 12 11     Imaging: No results found.  Speciality Comments: No specialty comments available.    Procedures:  No procedures performed Allergies: Sulfonamide derivatives   Assessment / Plan:     Visit Diagnoses: Autoimmune disease ([place]) - She has history of positive ANA and Raynaud's phenomena and arthritis. She has no synovitis on examination she is clinically doing well. She has no active Raynauds but she is hyperemia in her hands.  Sjogren's syndrome with keratoconjunctivitis sicca ([place]) - Controlled with over-the-counter products.  High risk medication use - PLQ 200 mg po bideye exam  04/18. She is scheduled to have eye exam in November. As she is clinically doing well I discussed with her reducing her Plaquenil to twice a day Monday to Friday only.  Primary osteoarthritis of both knees: She's been having increased pain and discomfort in her knee joints. She's done well on Euflexxa in the past. We will restart her for Euflexxa again.  Primary osteoarthritis of both hands: Doing better  Osteopenia of multiple sites: According to patient she's been getting DEXA done by her PCP. She's been taking calcium and vitamin D. Need for resistive exercises was discussed.  History of vitamin D deficiency: She is on supplement.  History of depression: Controlled with current medications.  History of anxiety    Orders: Orders Placed This Encounter  Procedures  . CBC with Differential/Platelet  . COMPLETE METABOLIC PANEL WITH GFR  . Urinalysis, Routine w reflex microscopic  . ANA  . C3 and C4  . Rheumatoid factor  . Protein electrophoresis, serum  .  Anti-scleroderma antibody   Meds ordered this encounter  Medications  . hydroxychloroquine (PLAQUENIL) 200 MG tablet    Sig: Take 1 tablet (200 mg total) by mouth 2 (two) times daily.    Dispense:  180 tablet    Refill:  1    Face-to-face time spent with patient was 30 minutes. Greater than 50% of time was spent in counseling and coordination of care.  Follow-Up Instructions: Return in about 5 months (around 01/20/2018) for Autoimmune disease, Sjogren's, Osteoarthritis.   Bo Merino, MD  Note - This record has been created using Editor, commissioning.  Chart creation errors have been sought, but may not always  have been located. Such creation errors do not reflect on  the standard of medical care.

## 2017-08-22 ENCOUNTER — Telehealth: Payer: Self-pay

## 2017-08-22 ENCOUNTER — Ambulatory Visit: Payer: Medicare Other | Admitting: Rheumatology

## 2017-08-22 ENCOUNTER — Ambulatory Visit (INDEPENDENT_AMBULATORY_CARE_PROVIDER_SITE_OTHER): Payer: Medicare Other | Admitting: Rheumatology

## 2017-08-22 ENCOUNTER — Encounter: Payer: Self-pay | Admitting: Rheumatology

## 2017-08-22 VITALS — BP 128/66 | HR 63 | Ht 67.0 in | Wt 155.0 lb

## 2017-08-22 DIAGNOSIS — M170 Bilateral primary osteoarthritis of knee: Secondary | ICD-10-CM | POA: Diagnosis not present

## 2017-08-22 DIAGNOSIS — Z8659 Personal history of other mental and behavioral disorders: Secondary | ICD-10-CM | POA: Diagnosis not present

## 2017-08-22 DIAGNOSIS — M3501 Sicca syndrome with keratoconjunctivitis: Secondary | ICD-10-CM | POA: Diagnosis not present

## 2017-08-22 DIAGNOSIS — M8589 Other specified disorders of bone density and structure, multiple sites: Secondary | ICD-10-CM

## 2017-08-22 DIAGNOSIS — Z79899 Other long term (current) drug therapy: Secondary | ICD-10-CM

## 2017-08-22 DIAGNOSIS — M19042 Primary osteoarthritis, left hand: Secondary | ICD-10-CM | POA: Diagnosis not present

## 2017-08-22 DIAGNOSIS — Z8639 Personal history of other endocrine, nutritional and metabolic disease: Secondary | ICD-10-CM | POA: Diagnosis not present

## 2017-08-22 DIAGNOSIS — M19041 Primary osteoarthritis, right hand: Secondary | ICD-10-CM

## 2017-08-22 DIAGNOSIS — D8989 Other specified disorders involving the immune mechanism, not elsewhere classified: Secondary | ICD-10-CM

## 2017-08-22 DIAGNOSIS — M359 Systemic involvement of connective tissue, unspecified: Secondary | ICD-10-CM

## 2017-08-22 MED ORDER — HYDROXYCHLOROQUINE SULFATE 200 MG PO TABS: 200.0000 mg | ORAL_TABLET | Freq: Two times a day (BID) | ORAL | 1 refills | Status: DC

## 2017-08-22 NOTE — Telephone Encounter (Signed)
Patient would like BIL Euflexxa injections.  Last series of injections were 04/03/17-04/17/17.

## 2017-08-22 NOTE — Patient Instructions (Signed)
Standing Labs We placed an order today for your standing lab work.    Please come back and get your standing labs in March  We have open lab Monday through Friday from 8:30-11:30 AM and 1:30-4 PM at the office of Dr. Damarius Karnes.   The office is located at [address], [address], [place], [place] [address] No appointment is necessary.   Labs are drawn by [facility].  You may receive a bill from [facility] for your lab work. If you have any questions regarding directions or hours of operation,  please call [phone].    

## 2017-08-23 ENCOUNTER — Encounter: Payer: Self-pay | Admitting: Rheumatology

## 2017-09-02 DIAGNOSIS — Z79899 Other long term (current) drug therapy: Secondary | ICD-10-CM | POA: Diagnosis not present

## 2017-09-02 DIAGNOSIS — H524 Presbyopia: Secondary | ICD-10-CM | POA: Diagnosis not present

## 2017-09-02 DIAGNOSIS — M3210 Systemic lupus erythematosus, organ or system involvement unspecified: Secondary | ICD-10-CM | POA: Diagnosis not present

## 2017-09-02 DIAGNOSIS — Z961 Presence of intraocular lens: Secondary | ICD-10-CM | POA: Diagnosis not present

## 2017-09-03 ENCOUNTER — Encounter: Payer: Self-pay | Admitting: Rheumatology

## 2017-09-10 NOTE — Telephone Encounter (Signed)
Applied for, pending benefits

## 2017-10-07 DIAGNOSIS — E039 Hypothyroidism, unspecified: Secondary | ICD-10-CM | POA: Diagnosis not present

## 2017-10-07 DIAGNOSIS — Z131 Encounter for screening for diabetes mellitus: Secondary | ICD-10-CM | POA: Diagnosis not present

## 2017-10-07 DIAGNOSIS — M859 Disorder of bone density and structure, unspecified: Secondary | ICD-10-CM | POA: Diagnosis not present

## 2017-10-07 DIAGNOSIS — M79671 Pain in right foot: Secondary | ICD-10-CM | POA: Diagnosis not present

## 2017-10-09 DIAGNOSIS — M329 Systemic lupus erythematosus, unspecified: Secondary | ICD-10-CM | POA: Diagnosis not present

## 2017-10-09 DIAGNOSIS — F3342 Major depressive disorder, recurrent, in full remission: Secondary | ICD-10-CM | POA: Diagnosis not present

## 2017-10-09 DIAGNOSIS — E039 Hypothyroidism, unspecified: Secondary | ICD-10-CM | POA: Diagnosis not present

## 2017-10-09 DIAGNOSIS — M3500 Sicca syndrome, unspecified: Secondary | ICD-10-CM | POA: Diagnosis not present

## 2017-10-09 DIAGNOSIS — N952 Postmenopausal atrophic vaginitis: Secondary | ICD-10-CM | POA: Diagnosis not present

## 2017-10-09 DIAGNOSIS — L309 Dermatitis, unspecified: Secondary | ICD-10-CM | POA: Diagnosis not present

## 2017-10-09 DIAGNOSIS — E559 Vitamin D deficiency, unspecified: Secondary | ICD-10-CM | POA: Diagnosis not present

## 2017-10-09 DIAGNOSIS — M1990 Unspecified osteoarthritis, unspecified site: Secondary | ICD-10-CM | POA: Diagnosis not present

## 2017-10-09 DIAGNOSIS — I7300 Raynaud's syndrome without gangrene: Secondary | ICD-10-CM | POA: Diagnosis not present

## 2017-10-09 DIAGNOSIS — J309 Allergic rhinitis, unspecified: Secondary | ICD-10-CM | POA: Diagnosis not present

## 2017-10-09 DIAGNOSIS — M85859 Other specified disorders of bone density and structure, unspecified thigh: Secondary | ICD-10-CM | POA: Diagnosis not present

## 2017-10-11 ENCOUNTER — Other Ambulatory Visit: Payer: Self-pay | Admitting: Family Medicine

## 2017-10-11 DIAGNOSIS — M85859 Other specified disorders of bone density and structure, unspecified thigh: Secondary | ICD-10-CM

## 2017-10-18 DIAGNOSIS — L720 Epidermal cyst: Secondary | ICD-10-CM | POA: Diagnosis not present

## 2017-10-18 DIAGNOSIS — B999 Unspecified infectious disease: Secondary | ICD-10-CM | POA: Diagnosis not present

## 2017-10-18 DIAGNOSIS — Z23 Encounter for immunization: Secondary | ICD-10-CM | POA: Diagnosis not present

## 2017-10-23 ENCOUNTER — Encounter: Payer: Self-pay | Admitting: Rheumatology

## 2017-10-30 DIAGNOSIS — L820 Inflamed seborrheic keratosis: Secondary | ICD-10-CM | POA: Diagnosis not present

## 2017-10-30 DIAGNOSIS — L570 Actinic keratosis: Secondary | ICD-10-CM | POA: Diagnosis not present

## 2017-10-30 DIAGNOSIS — Z23 Encounter for immunization: Secondary | ICD-10-CM | POA: Diagnosis not present

## 2017-10-30 DIAGNOSIS — L821 Other seborrheic keratosis: Secondary | ICD-10-CM | POA: Diagnosis not present

## 2017-11-06 ENCOUNTER — Telehealth: Payer: Self-pay | Admitting: Rheumatology

## 2017-11-06 NOTE — Telephone Encounter (Signed)
Patient left a voicemail requesting a return call about her Euflexxa.  CB# [phone]

## 2017-11-07 ENCOUNTER — Ambulatory Visit
Admission: RE | Admit: 2017-11-07 | Discharge: 2017-11-07 | Disposition: A | Discharge status: Home / Self Care | Payer: Medicare Other | Source: Ambulatory Visit | Attending: Family Medicine | Admitting: Family Medicine

## 2017-11-07 DIAGNOSIS — M85852 Other specified disorders of bone density and structure, left thigh: Secondary | ICD-10-CM | POA: Diagnosis not present

## 2017-11-07 DIAGNOSIS — M85859 Other specified disorders of bone density and structure, unspecified thigh: Secondary | ICD-10-CM

## 2017-11-07 DIAGNOSIS — Z780 Asymptomatic menopausal state: Secondary | ICD-10-CM | POA: Diagnosis not present

## 2017-11-12 DIAGNOSIS — Z23 Encounter for immunization: Secondary | ICD-10-CM | POA: Diagnosis not present

## 2017-11-12 DIAGNOSIS — D485 Neoplasm of uncertain behavior of skin: Secondary | ICD-10-CM | POA: Diagnosis not present

## 2017-11-12 DIAGNOSIS — L814 Other melanin hyperpigmentation: Secondary | ICD-10-CM | POA: Diagnosis not present

## 2017-11-12 DIAGNOSIS — D1801 Hemangioma of skin and subcutaneous tissue: Secondary | ICD-10-CM | POA: Diagnosis not present

## 2017-11-12 DIAGNOSIS — L570 Actinic keratosis: Secondary | ICD-10-CM | POA: Diagnosis not present

## 2017-11-12 DIAGNOSIS — L821 Other seborrheic keratosis: Secondary | ICD-10-CM | POA: Diagnosis not present

## 2017-11-12 DIAGNOSIS — D225 Melanocytic nevi of trunk: Secondary | ICD-10-CM | POA: Diagnosis not present

## 2017-11-12 DIAGNOSIS — C44612 Basal cell carcinoma of skin of right upper limb, including shoulder: Secondary | ICD-10-CM | POA: Diagnosis not present

## 2017-11-14 NOTE — Telephone Encounter (Signed)
Euflexxa applied for, pending approval

## 2017-11-15 ENCOUNTER — Encounter: Payer: Self-pay | Admitting: Rheumatology

## 2017-11-15 ENCOUNTER — Telehealth: Payer: Self-pay | Admitting: *Deleted

## 2017-11-15 NOTE — Telephone Encounter (Signed)
Euflexxa, buy and bill, bilateral, schedule appts w/[facility] please. Thank you.

## 2017-11-18 ENCOUNTER — Ambulatory Visit (INDEPENDENT_AMBULATORY_CARE_PROVIDER_SITE_OTHER): Payer: Medicare Other | Admitting: Rheumatology

## 2017-11-18 ENCOUNTER — Encounter: Payer: Self-pay | Admitting: Rheumatology

## 2017-11-18 DIAGNOSIS — M170 Bilateral primary osteoarthritis of knee: Secondary | ICD-10-CM

## 2017-11-18 MED ORDER — LIDOCAINE HCL 1 % IJ SOLN
1.5000 mL | INTRAMUSCULAR | Status: AC | PRN
  Administered 2017-11-18: 1.5 mL

## 2017-11-18 MED ORDER — SODIUM HYALURONATE (VISCOSUP) 20 MG/2ML IX SOSY
20.0000 mg | PREFILLED_SYRINGE | INTRA_ARTICULAR | Status: AC | PRN
  Administered 2017-11-18: 20 mg via INTRA_ARTICULAR

## 2017-11-18 NOTE — Progress Notes (Addendum)
   Procedure Note  Patient: [name]             Date of Birth: 1946/06/18           MRN: [id number]             Visit Date: 11/18/2017  Procedures: Visit Diagnoses: Primary osteoarthritis of both knees - Plan: Large Joint Inj: bilateral knee, Sodium Hyaluronate SOSY 20 mg, Sodium Hyaluronate SOSY 20 mg, lidocaine (XYLOCAINE) 1 % (with pres) injection 1.5 mL, lidocaine (XYLOCAINE) 1 % (with pres) injection 1.5 mL Euflexxa #1 bilateral  Large Joint Inj: bilateral knee on 11/18/2017 8:40 AM Indications: pain Details: 27 G 1.5 in needle, medial approach  Arthrogram: No  Medications (Right): 20 mg Sodium Hyaluronate 20 MG/2ML; 1.5 mL lidocaine 1 % Aspirate (Right): 0 mL Medications (Left): 20 mg Sodium Hyaluronate 20 MG/2ML; 1.5 mL lidocaine 1 % Aspirate (Left): 0 mL Outcome: tolerated well, no immediate complications Procedure, treatment alternatives, risks and benefits explained, specific risks discussed. Consent was given by the patient. Immediately prior to procedure a time out was called to verify the correct patient, procedure, equipment, support staff and site/side marked as required. Patient was prepped and draped in the usual sterile fashion.     Ofilia Neas, PA-C

## 2017-11-20 DIAGNOSIS — Z978 Presence of other specified devices: Secondary | ICD-10-CM | POA: Diagnosis not present

## 2017-11-20 DIAGNOSIS — M2021 Hallux rigidus, right foot: Secondary | ICD-10-CM | POA: Diagnosis not present

## 2017-11-25 ENCOUNTER — Ambulatory Visit (INDEPENDENT_AMBULATORY_CARE_PROVIDER_SITE_OTHER): Payer: Medicare Other | Admitting: Physician Assistant

## 2017-11-25 DIAGNOSIS — M170 Bilateral primary osteoarthritis of knee: Secondary | ICD-10-CM | POA: Diagnosis not present

## 2017-11-25 MED ORDER — SODIUM HYALURONATE (VISCOSUP) 20 MG/2ML IX SOSY
20.0000 mg | PREFILLED_SYRINGE | INTRA_ARTICULAR | Status: AC | PRN
  Administered 2017-11-25: 20 mg via INTRA_ARTICULAR

## 2017-11-25 MED ORDER — LIDOCAINE HCL 1 % IJ SOLN
1.5000 mL | INTRAMUSCULAR | Status: AC | PRN
  Administered 2017-11-25: 1.5 mL

## 2017-11-25 NOTE — Progress Notes (Signed)
   Procedure Note  Patient: [name]             Date of Birth: Sep 22, 1946           MRN: [id number]             Visit Date: 11/25/2017  Procedures: Visit Diagnoses: Primary osteoarthritis of both knees - Plan: Large Joint Inj: bilateral knee Euflexxa #2 bilateral  Large Joint Inj: bilateral knee on 11/25/2017 8:19 AM Indications: pain Details: 27 G 1.5 in needle, medial approach  Arthrogram: No  Medications (Right): 1.5 mL lidocaine 1 %; 20 mg Sodium Hyaluronate 20 MG/2ML Aspirate (Right): 0 mL Medications (Left): 1.5 mL lidocaine 1 %; 20 mg Sodium Hyaluronate 20 MG/2ML Aspirate (Left): 0 mL Outcome: tolerated well, no immediate complications Procedure, treatment alternatives, risks and benefits explained, specific risks discussed. Consent was given by the patient. Immediately prior to procedure a time out was called to verify the correct patient, procedure, equipment, support staff and site/side marked as required. Patient was prepped and draped in the usual sterile fashion.      Hazel Sams, PA-C

## 2017-12-02 ENCOUNTER — Ambulatory Visit (INDEPENDENT_AMBULATORY_CARE_PROVIDER_SITE_OTHER): Payer: Medicare Other | Admitting: Physician Assistant

## 2017-12-02 ENCOUNTER — Encounter: Payer: Self-pay | Admitting: Rheumatology

## 2017-12-02 DIAGNOSIS — M170 Bilateral primary osteoarthritis of knee: Secondary | ICD-10-CM

## 2017-12-02 DIAGNOSIS — C44612 Basal cell carcinoma of skin of right upper limb, including shoulder: Secondary | ICD-10-CM | POA: Diagnosis not present

## 2017-12-02 MED ORDER — LIDOCAINE HCL 1 % IJ SOLN
1.5000 mL | INTRAMUSCULAR | Status: AC | PRN
  Administered 2017-12-02: 1.5 mL

## 2017-12-02 MED ORDER — SODIUM HYALURONATE (VISCOSUP) 20 MG/2ML IX SOSY
20.0000 mg | PREFILLED_SYRINGE | INTRA_ARTICULAR | Status: AC | PRN
  Administered 2017-12-02: 20 mg via INTRA_ARTICULAR

## 2017-12-02 NOTE — Progress Notes (Signed)
   Procedure Note  Patient: [name]             Date of Birth: 07/26/1946           MRN: [id number]             Visit Date: 12/02/2017  Procedures: Visit Diagnoses: Primary osteoarthritis of both knees - Plan: Large Joint Inj: bilateral knee Euflexxa #3 bilateral  Large Joint Inj: bilateral knee on 12/02/2017 8:37 AM Indications: pain Details: 27 G 1.5 in needle, medial approach  Arthrogram: No  Medications (Right): 20 mg Sodium Hyaluronate 20 MG/2ML; 1.5 mL lidocaine 1 % Aspirate (Right): 0 mL Medications (Left): 20 mg Sodium Hyaluronate 20 MG/2ML; 1.5 mL lidocaine 1 % Aspirate (Left): 0 mL Outcome: tolerated well, no immediate complications Procedure, treatment alternatives, risks and benefits explained, specific risks discussed. Consent was given by the patient. Immediately prior to procedure a time out was called to verify the correct patient, procedure, equipment, support staff and site/side marked as required. Patient was prepped and draped in the usual sterile fashion.

## 2017-12-02 NOTE — Telephone Encounter (Signed)
I discussed this with Dr. Estanislado Pandy.  Please advise patient to discontinue all supplements.

## 2017-12-03 DIAGNOSIS — M85859 Other specified disorders of bone density and structure, unspecified thigh: Secondary | ICD-10-CM | POA: Diagnosis not present

## 2017-12-03 DIAGNOSIS — C44612 Basal cell carcinoma of skin of right upper limb, including shoulder: Secondary | ICD-10-CM | POA: Diagnosis not present

## 2017-12-03 DIAGNOSIS — M7751 Other enthesopathy of right foot: Secondary | ICD-10-CM | POA: Diagnosis not present

## 2017-12-17 DIAGNOSIS — Z967 Presence of other bone and tendon implants: Secondary | ICD-10-CM | POA: Diagnosis not present

## 2017-12-17 DIAGNOSIS — T84498A Other mechanical complication of other internal orthopedic devices, implants and grafts, initial encounter: Secondary | ICD-10-CM | POA: Diagnosis not present

## 2017-12-17 DIAGNOSIS — M2021 Hallux rigidus, right foot: Secondary | ICD-10-CM | POA: Diagnosis not present

## 2017-12-17 DIAGNOSIS — G8918 Other acute postprocedural pain: Secondary | ICD-10-CM | POA: Diagnosis not present

## 2017-12-17 DIAGNOSIS — M19071 Primary osteoarthritis, right ankle and foot: Secondary | ICD-10-CM | POA: Diagnosis not present

## 2017-12-17 DIAGNOSIS — T8484XA Pain due to internal orthopedic prosthetic devices, implants and grafts, initial encounter: Secondary | ICD-10-CM | POA: Diagnosis not present

## 2017-12-17 HISTORY — PX: FOOT SURGERY: SHX648

## 2018-01-10 NOTE — Progress Notes (Signed)
Office Visit Note  Patient: [name]             Date of Birth: 05/16/1946           MRN: [id number]             PCP: Cari Caraway, MD Referring: Cari Caraway, MD Visit Date: 01/23/2018 Occupation: @GUAROCC @    Subjective:  Sicca symptoms   History of Present Illness: [name] is a 72 y.o. female with history of autoimmune disease, Sjogren's syndrome, and osteoarthritis.  Patient states she continues to take Plaquenil 200 mg twice daily Monday through Friday.  She states that she came for lab work on 01/16/2018 and was diagnosed with a UTI.  She is currently on Macrobid.  She is asymptomatic at this time.  She denies any recent flares of her autoimmune disease.  She denies any sores in her mouth or nose, swollen lymph nodes, rashes, photosensitivity, fatigue, or fevers.  She states she continues to have Raynauds in her hands.  She denies any digital ulcerations.  She continues to have sicca symptoms.  She is Biotene products with some relief.  She states she continues to see the dentist 3 times a year.  She denies any recent dental caries.  She states that her ophthalmologist follows up with her yearly for her eye exams.  She states she continues to have some discomfort in her right knee.  She states the pain is most severe when she is going downstairs.  She states that she has a sensation of a giving way.  She denies any injuries or falls recently.  She states that the gel injections have helped her knees.  She denies any joint swelling at this time.  She denies any pain in her hands.  She states that she was recently started on Boniva on March 1 by her PCP due to recent DEXA scan results.  She denies any side effects and starting Boniva.  She continues to take vitamin D and calcium daily. Patient states that on 12/17/2017 she had a bone spur removed.  Before the surgery she stopped her supplements has not resumed.    Activities of Daily Living:  Patient reports morning  stiffness for 5 minutes.   Patient Denies nocturnal pain.  Difficulty dressing/grooming: Denies Difficulty climbing stairs: Denies Difficulty getting out of chair: Denies Difficulty using hands for taps, buttons, cutlery, and/or writing: Denies   Review of Systems  Constitutional: Negative for fatigue.  HENT: Negative for mouth sores, mouth dryness and nose dryness.   Eyes: Positive for dryness. Negative for pain and visual disturbance.  Respiratory: Negative for cough, hemoptysis, shortness of breath and difficulty breathing.   Cardiovascular: Negative for chest pain, palpitations, hypertension and swelling in legs/feet.  Gastrointestinal: Negative for abdominal pain, blood in stool, constipation and diarrhea.  Endocrine: Negative for increased urination.  Genitourinary: Negative for painful urination and pelvic pain.  Musculoskeletal: Positive for arthralgias, joint pain and morning stiffness. Negative for joint swelling, myalgias, muscle weakness, muscle tenderness and myalgias.  Skin: Negative for color change, pallor, rash, hair loss, nodules/bumps, skin tightness, ulcers and sensitivity to sunlight.  Allergic/Immunologic: Negative for susceptible to infections.  Neurological: Negative for dizziness, numbness, headaches, memory loss and weakness.  Hematological: Positive for bruising/bleeding tendency. Negative for swollen glands.  Psychiatric/Behavioral: Negative for depressed mood, confusion and sleep disturbance. The patient is not nervous/anxious.     PMFS History:  Patient Active Problem List   Diagnosis Date Noted  .  Osteopenia of multiple sites 08/22/2017  . History of vitamin D deficiency 08/22/2017  . Autoimmune disease ([place]) 10/23/2016  . Sjogren's syndrome ([place]) 10/09/2016  . Primary osteoarthritis of both hands 10/09/2016  . Primary osteoarthritis of both knees 10/09/2016  . High risk medication use 09/12/2016  . SBO (small bowel obstruction), partial postop  10/16/2011  . 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 Allergy   . Colon polyp   . Environmental allergies   . Hypothyroid   . Osteoarthritis   . Osteoarthritis   . Osteoarthritis   . Osteopenia   . Raynauds phenomenon   . 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 Sjogren's syndrome ([place])   . Systemic lupus ([facility])     Family History  Problem Relation Age of Onset  . Heart disease Mother   . Psoriasis Father   . Arthritis Father   . Heart disease Brother   . Heart attack Brother   . Diabetes Maternal Grandfather   . Cancer Maternal Uncle        colon  . Colon cancer Maternal Uncle 19  . Asthma Sister    Past Surgical History:  Procedure Laterality Date  . APPENDECTOMY    . BUNIONECTOMY  2008   right foot  . COLONOSCOPY W/ POLYPECTOMY  Aug 27, 2011  . ENDOMETRIAL ABLATION    . FOOT SURGERY Right 12/17/2017   bone spur  . LAPAROSCOPIC APPENDECTOMY  10/11/2011   Procedure: APPENDECTOMY LAPAROSCOPIC;  Surgeon: Adin Hector, MD;  Location: [facility];  Service: General;  Laterality: N/A;  Partial Cecectomy and Appendectomy  . WISDOM TOOTH EXTRACTION     Social History   Social History Narrative  . Not on file     Objective: Vital Signs: BP 110/69 (BP Location: Left Arm, Patient Position: Sitting, Cuff Size: Normal)   Pulse (!) 58   Resp 13   Ht 5' 6.5" (1.689 m)   Wt 156 lb (70.8 kg)   BMI 24.80 kg/m    Physical Exam  Constitutional: She is oriented to person, place, and time. She appears well-developed and well-nourished.  HENT:  Head: Normocephalic and atraumatic.  Eyes: Conjunctivae and EOM are normal.  Neck: Normal range of motion.  Cardiovascular: Normal rate, regular rhythm, normal heart sounds and intact distal pulses.  Pulmonary/Chest: Effort normal and breath sounds normal.  Abdominal: Soft. Bowel sounds are normal.  Lymphadenopathy:     She has no cervical adenopathy.  Neurological: She is alert and oriented to person, place, and time.  Skin: Skin is warm and dry. Capillary refill takes 2 to 3 seconds.  Psychiatric: She has a normal mood and affect. Her behavior is normal.  Nursing note and vitals reviewed.    Musculoskeletal Exam: C-spine limited ROM. Thoracic spine and lumbar spine good range of motion.  No midline spinal tenderness.  No SI joint tenderness.  Shoulder joints, elbow joints, wrist joints, MCPs, PIPs, DIPs good range of motion with no synovitis.  Hip joints, knee joints, ankle joints, MTPs, PIPs, DIPs good range of motion with no synovitis.  She has bilateral knee crepitus.  No warmth or effusion of bilateral knees.  No tenderness of trochanteric bursa.  CDAI Exam: No CDAI exam completed.    Investigation: No additional findings.PLQ eye exam: 09/02/2017 CBC Latest Ref Rng & Units 01/16/2018 08/07/2017 02/11/2017  WBC 3.8 - 10.8 Thousand/uL 6.4 5.7 5.0  Hemoglobin 11.7 - 15.5 g/dL 13.6 13.0 13.0  Hematocrit 35.0 - 45.0 % 40.0 38.4 40.9  Platelets 140 - 400 Thousand/uL 246 230 257   CMP Latest Ref Rng & Units 01/16/2018 01/16/2018 08/07/2017  Glucose 65 - 99 mg/dL 80 - 90  BUN 7 - 25 mg/dL 13 - 13  Creatinine 0.60 - 0.93 mg/dL 0.70 - 0.73  Sodium 135 - 146 mmol/L 141 - 142  Potassium 3.5 - 5.3 mmol/L 4.4 - 4.0  Chloride 98 - 110 mmol/L 105 - 107  CO2 20 - 32 mmol/L 30 - 29  Calcium 8.6 - 10.4 mg/dL 9.5 - 9.3  Total Protein 6.1 - 8.1 g/dL 6.8 6.8 6.6  Total Bilirubin 0.2 - 1.2 mg/dL 0.5 - 0.4  Alkaline Phos 33 - 130 U/L - - -  AST 10 - 35 U/L 20 - 16  ALT 6 - 29 U/L 10 - 9    Imaging: No results found.  Speciality Comments: PLQ eye exam: 09/02/2017 Normal. Dr. Rutherford Guys. Follow up in 1 year.    Procedures:  No procedures performed Allergies: Sulfonamide derivatives   Assessment / Plan:     Visit Diagnoses: Autoimmune disease ([place]) - She has history of positive ANA and Raynaud's phenomena  and arthritis.:  She has had any recent flares of her autoimmune disease.  Autoimmune labs were within normal limits on 01/16/2018.  She continues to take Plaquenil 200 mg twice daily Monday through Friday.  She has no oral or nasal ulcers on exam.  No cervical lymphadenopathy was noted no malar rash was noted.  She has not any recent rashes or photosensitivity.  She continues to have sicca symptoms and uses Biotene products.  She has Raynaud's in her hands but no digital ulcerations were noted.  Capillary refill was between 2-3 seconds in bilateral hands. Her recent UA revealed signs of infection.  She was started on Macrobid by her PCP.  She is asymptomatic at this time.  Sjogren's syndrome with keratoconjunctivitis sicca ([place]):  She continues to have sicca symptoms.  She uses Biotene products with some relief.  No parotid swelling on exam.  She continues to see her dentist 3 times a year.  She denies any dental caries.  SPEP, RF, UA, CBC, and CMP were checked on 01/16/2018.  High risk medication use - PLQ eye exam: 09/02/2017.  Her ophthalmologist recommended yearly eye exams.  CBC and CMP were within normal limits on 01/16/2018.  Primary osteoarthritis of both hands: She has PIP and DIP synovial thickening consistent with osteoarthritis.  She has no discomfort in her hands at this time.  Joint protection and muscle strengthening were discussed.  Primary osteoarthritis of both knees: No warmth or effusion of bilateral knees.  She has bilateral knee crepitus.  Her knee discomfort has improved since having Euflexxa injections at the beginning of February 2019.  When she is descending stairs she gets the sensation of her right knee wanting to "give out."  She has not had any recent injuries or falls.  Osteopenia of multiple sites -Her PCP has recently started her on Boniva on December 20, 2017.  She denies any side effects from [place].  She continues to take vitamin D and calcium daily.  She reports her PCP will  continue to manage her bone density scans and Boniva.  History of anxiety: A refill of Lexapro was sent to the pharmacy.  History of vitamin D deficiency: She continues to take vitamin D supplements daily.  History of  depression: A refill of Lexapro sent to the pharmacy.   Orders: No orders of the defined types were placed in this encounter.  Meds ordered this encounter  Medications  . escitalopram (LEXAPRO) 10 MG tablet    Sig: Take 1 tablet (10 mg total) by mouth daily.    Dispense:  90 tablet    Refill:  1   Follow-Up Instructions: Return in about 6 months (around 07/25/2018) for Autoimmune Disease, Sjogren's syndrome, Osteoarthritis.   Ofilia Neas, PA-C   I examined and evaluated the patient with Hazel Sams PA. The plan of care was discussed as noted above.  Bo Merino, MD  Note - This record has been created using Editor, commissioning.  Chart creation errors have been sought, but may not always  have been located. Such creation errors do not reflect on  the standard of medical care.

## 2018-01-16 ENCOUNTER — Other Ambulatory Visit: Payer: Self-pay | Admitting: *Deleted

## 2018-01-16 DIAGNOSIS — D8989 Other specified disorders involving the immune mechanism, not elsewhere classified: Secondary | ICD-10-CM | POA: Diagnosis not present

## 2018-01-16 DIAGNOSIS — Z79899 Other long term (current) drug therapy: Secondary | ICD-10-CM

## 2018-01-16 DIAGNOSIS — M3501 Sicca syndrome with keratoconjunctivitis: Secondary | ICD-10-CM | POA: Diagnosis not present

## 2018-01-16 DIAGNOSIS — M359 Systemic involvement of connective tissue, unspecified: Secondary | ICD-10-CM

## 2018-01-17 DIAGNOSIS — N390 Urinary tract infection, site not specified: Secondary | ICD-10-CM | POA: Diagnosis not present

## 2018-01-17 NOTE — Progress Notes (Signed)
UA shows moderate bacteria.  Please advise patient to be seen at urgent care or PCP for the treatment.

## 2018-01-20 LAB — PROTEIN ELECTROPHORESIS, SERUM
ALBUMIN ELP: 4 g/dL (ref 3.8–4.8)
ALPHA 1: 0.3 g/dL (ref 0.2–0.3)
Alpha 2: 0.6 g/dL (ref 0.5–0.9)
BETA 2: 0.4 g/dL (ref 0.2–0.5)
Beta Globulin: 0.4 g/dL (ref 0.4–0.6)
Gamma Globulin: 1.1 g/dL (ref 0.8–1.7)
TOTAL PROTEIN: 6.8 g/dL (ref 6.1–8.1)

## 2018-01-20 LAB — CBC WITH DIFFERENTIAL/PLATELET
BASOS ABS: 58 {cells}/uL (ref 0–200)
Basophils Relative: 0.9 %
Eosinophils Absolute: 230 cells/uL (ref 15–500)
Eosinophils Relative: 3.6 %
HEMATOCRIT: 40 % (ref 35.0–45.0)
Hemoglobin: 13.6 g/dL (ref 11.7–15.5)
LYMPHS ABS: 915 {cells}/uL (ref 850–3900)
MCH: 32.5 pg (ref 27.0–33.0)
MCHC: 34 g/dL (ref 32.0–36.0)
MCV: 95.7 fL (ref 80.0–100.0)
MPV: 11 fL (ref 7.5–12.5)
Monocytes Relative: 8.5 %
NEUTROS PCT: 72.7 %
Neutro Abs: 4653 cells/uL (ref 1500–7800)
Platelets: 246 10*3/uL (ref 140–400)
RBC: 4.18 10*6/uL (ref 3.80–5.10)
RDW: 11.8 % (ref 11.0–15.0)
TOTAL LYMPHOCYTE: 14.3 %
WBC: 6.4 10*3/uL (ref 3.8–10.8)
WBCMIX: 544 {cells}/uL (ref 200–950)

## 2018-01-20 LAB — ANA: Anti Nuclear Antibody(ANA): NEGATIVE

## 2018-01-20 LAB — COMPLETE METABOLIC PANEL WITH GFR
AG Ratio: 1.6 (calc) (ref 1.0–2.5)
ALKALINE PHOSPHATASE (APISO): 52 U/L (ref 33–130)
ALT: 10 U/L (ref 6–29)
AST: 20 U/L (ref 10–35)
Albumin: 4.2 g/dL (ref 3.6–5.1)
BILIRUBIN TOTAL: 0.5 mg/dL (ref 0.2–1.2)
BUN: 13 mg/dL (ref 7–25)
CHLORIDE: 105 mmol/L (ref 98–110)
CO2: 30 mmol/L (ref 20–32)
CREATININE: 0.7 mg/dL (ref 0.60–0.93)
Calcium: 9.5 mg/dL (ref 8.6–10.4)
GFR, Est African American: 101 mL/min/{1.73_m2} (ref >=60)
GFR, Est Non African American: 87 mL/min/{1.73_m2} (ref >=60)
GLOBULIN: 2.6 g/dL (ref 1.9–3.7)
Glucose, Bld: 80 mg/dL (ref 65–99)
Potassium: 4.4 mmol/L (ref 3.5–5.3)
SODIUM: 141 mmol/L (ref 135–146)
Total Protein: 6.8 g/dL (ref 6.1–8.1)

## 2018-01-20 LAB — URINALYSIS, ROUTINE W REFLEX MICROSCOPIC
Bilirubin Urine: NEGATIVE
Glucose, UA: NEGATIVE
HGB URINE DIPSTICK: NEGATIVE
Hyaline Cast: NONE SEEN /LPF
KETONES UR: NEGATIVE
Nitrite: NEGATIVE
PH: 7.5 (ref 5.0–8.0)
Protein, ur: NEGATIVE
RBC / HPF: NONE SEEN /HPF (ref 0–2)
Specific Gravity, Urine: 1.006 (ref 1.001–1.03)

## 2018-01-20 LAB — RHEUMATOID FACTOR: Rhuematoid fact SerPl-aCnc: <14 IU/mL (ref <14)

## 2018-01-20 LAB — C3 AND C4
C3 Complement: 125 mg/dL (ref 83–193)
C4 COMPLEMENT: 23 mg/dL (ref 15–57)

## 2018-01-20 LAB — ANTI-SCLERODERMA ANTIBODY: SCLERODERMA (SCL-70) (ENA) ANTIBODY, IGG: NEGATIVE AI

## 2018-01-23 ENCOUNTER — Ambulatory Visit (INDEPENDENT_AMBULATORY_CARE_PROVIDER_SITE_OTHER): Payer: Medicare Other | Admitting: Rheumatology

## 2018-01-23 ENCOUNTER — Encounter: Payer: Self-pay | Admitting: Rheumatology

## 2018-01-23 VITALS — BP 110/69 | HR 58 | Resp 13 | Ht 66.5 in | Wt 156.0 lb

## 2018-01-23 DIAGNOSIS — Z8659 Personal history of other mental and behavioral disorders: Secondary | ICD-10-CM

## 2018-01-23 DIAGNOSIS — D8989 Other specified disorders involving the immune mechanism, not elsewhere classified: Secondary | ICD-10-CM

## 2018-01-23 DIAGNOSIS — M8589 Other specified disorders of bone density and structure, multiple sites: Secondary | ICD-10-CM | POA: Diagnosis not present

## 2018-01-23 DIAGNOSIS — M19042 Primary osteoarthritis, left hand: Secondary | ICD-10-CM | POA: Diagnosis not present

## 2018-01-23 DIAGNOSIS — M3501 Sicca syndrome with keratoconjunctivitis: Secondary | ICD-10-CM

## 2018-01-23 DIAGNOSIS — Z79899 Other long term (current) drug therapy: Secondary | ICD-10-CM

## 2018-01-23 DIAGNOSIS — M19041 Primary osteoarthritis, right hand: Secondary | ICD-10-CM | POA: Diagnosis not present

## 2018-01-23 DIAGNOSIS — Z8639 Personal history of other endocrine, nutritional and metabolic disease: Secondary | ICD-10-CM

## 2018-01-23 DIAGNOSIS — M170 Bilateral primary osteoarthritis of knee: Secondary | ICD-10-CM

## 2018-01-23 DIAGNOSIS — M359 Systemic involvement of connective tissue, unspecified: Secondary | ICD-10-CM

## 2018-01-23 MED ORDER — ESCITALOPRAM OXALATE 10 MG PO TABS: 10.0000 mg | ORAL_TABLET | Freq: Every day | ORAL | 1 refills | Status: DC

## 2018-01-24 ENCOUNTER — Encounter: Payer: Self-pay | Admitting: Rheumatology

## 2018-01-28 ENCOUNTER — Other Ambulatory Visit: Payer: Self-pay | Admitting: *Deleted

## 2018-01-28 ENCOUNTER — Encounter: Payer: Self-pay | Admitting: Rheumatology

## 2018-01-28 DIAGNOSIS — M359 Systemic involvement of connective tissue, unspecified: Secondary | ICD-10-CM

## 2018-01-28 NOTE — Telephone Encounter (Signed)
Usually hematologist added IFE when indicated.  As there is a concern by the patient we can order IFE she will have to come in to get blood drawn.

## 2018-02-03 ENCOUNTER — Other Ambulatory Visit: Payer: Self-pay | Admitting: *Deleted

## 2018-02-03 DIAGNOSIS — D8989 Other specified disorders involving the immune mechanism, not elsewhere classified: Secondary | ICD-10-CM | POA: Diagnosis not present

## 2018-02-03 DIAGNOSIS — M359 Systemic involvement of connective tissue, unspecified: Secondary | ICD-10-CM

## 2018-02-05 ENCOUNTER — Encounter: Payer: Self-pay | Admitting: Rheumatology

## 2018-02-05 LAB — IFE INTERPRETATION: Immunofix Electr Int: NOT DETECTED

## 2018-02-12 DIAGNOSIS — M2021 Hallux rigidus, right foot: Secondary | ICD-10-CM | POA: Diagnosis not present

## 2018-02-12 DIAGNOSIS — M79671 Pain in right foot: Secondary | ICD-10-CM | POA: Diagnosis not present

## 2018-02-18 DIAGNOSIS — M79671 Pain in right foot: Secondary | ICD-10-CM | POA: Diagnosis not present

## 2018-02-18 DIAGNOSIS — M2021 Hallux rigidus, right foot: Secondary | ICD-10-CM | POA: Diagnosis not present

## 2018-03-17 DIAGNOSIS — L259 Unspecified contact dermatitis, unspecified cause: Secondary | ICD-10-CM | POA: Diagnosis not present

## 2018-04-07 ENCOUNTER — Other Ambulatory Visit: Payer: Self-pay | Admitting: Family Medicine

## 2018-04-07 DIAGNOSIS — Z1231 Encounter for screening mammogram for malignant neoplasm of breast: Secondary | ICD-10-CM

## 2018-04-07 DIAGNOSIS — N644 Mastodynia: Secondary | ICD-10-CM | POA: Diagnosis not present

## 2018-04-09 ENCOUNTER — Other Ambulatory Visit: Payer: Self-pay | Admitting: Family Medicine

## 2018-04-09 DIAGNOSIS — N644 Mastodynia: Secondary | ICD-10-CM

## 2018-04-10 ENCOUNTER — Ambulatory Visit
Admission: RE | Admit: 2018-04-10 | Discharge: 2018-04-10 | Disposition: A | Discharge status: Home / Self Care | Payer: Medicare Other | Source: Ambulatory Visit | Attending: Family Medicine | Admitting: Family Medicine

## 2018-04-10 DIAGNOSIS — N644 Mastodynia: Secondary | ICD-10-CM

## 2018-04-10 DIAGNOSIS — N6489 Other specified disorders of breast: Secondary | ICD-10-CM | POA: Diagnosis not present

## 2018-04-10 DIAGNOSIS — R922 Inconclusive mammogram: Secondary | ICD-10-CM | POA: Diagnosis not present

## 2018-04-15 DIAGNOSIS — M2021 Hallux rigidus, right foot: Secondary | ICD-10-CM | POA: Diagnosis not present

## 2018-04-15 DIAGNOSIS — M79671 Pain in right foot: Secondary | ICD-10-CM | POA: Diagnosis not present

## 2018-04-17 ENCOUNTER — Encounter: Payer: Self-pay | Admitting: Rheumatology

## 2018-04-18 DIAGNOSIS — R300 Dysuria: Secondary | ICD-10-CM | POA: Diagnosis not present

## 2018-04-18 DIAGNOSIS — E039 Hypothyroidism, unspecified: Secondary | ICD-10-CM | POA: Diagnosis not present

## 2018-05-02 DIAGNOSIS — R3915 Urgency of urination: Secondary | ICD-10-CM | POA: Diagnosis not present

## 2018-05-02 DIAGNOSIS — N898 Other specified noninflammatory disorders of vagina: Secondary | ICD-10-CM | POA: Diagnosis not present

## 2018-05-08 DIAGNOSIS — Z86010 Personal history of colonic polyps: Secondary | ICD-10-CM | POA: Diagnosis not present

## 2018-05-08 DIAGNOSIS — N390 Urinary tract infection, site not specified: Secondary | ICD-10-CM | POA: Diagnosis not present

## 2018-05-08 DIAGNOSIS — M329 Systemic lupus erythematosus, unspecified: Secondary | ICD-10-CM | POA: Diagnosis not present

## 2018-05-08 DIAGNOSIS — J309 Allergic rhinitis, unspecified: Secondary | ICD-10-CM | POA: Diagnosis not present

## 2018-05-08 DIAGNOSIS — F3342 Major depressive disorder, recurrent, in full remission: Secondary | ICD-10-CM | POA: Diagnosis not present

## 2018-05-08 DIAGNOSIS — N898 Other specified noninflammatory disorders of vagina: Secondary | ICD-10-CM | POA: Diagnosis not present

## 2018-05-08 DIAGNOSIS — M85859 Other specified disorders of bone density and structure, unspecified thigh: Secondary | ICD-10-CM | POA: Diagnosis not present

## 2018-05-08 DIAGNOSIS — M545 Low back pain: Secondary | ICD-10-CM | POA: Diagnosis not present

## 2018-05-08 DIAGNOSIS — M3500 Sicca syndrome, unspecified: Secondary | ICD-10-CM | POA: Diagnosis not present

## 2018-05-08 DIAGNOSIS — E039 Hypothyroidism, unspecified: Secondary | ICD-10-CM | POA: Diagnosis not present

## 2018-05-08 DIAGNOSIS — Z0000 Encounter for general adult medical examination without abnormal findings: Secondary | ICD-10-CM | POA: Diagnosis not present

## 2018-05-13 DIAGNOSIS — N390 Urinary tract infection, site not specified: Secondary | ICD-10-CM | POA: Diagnosis not present

## 2018-05-13 DIAGNOSIS — R339 Retention of urine, unspecified: Secondary | ICD-10-CM | POA: Diagnosis not present

## 2018-05-13 DIAGNOSIS — B961 Klebsiella pneumoniae [K. pneumoniae] as the cause of diseases classified elsewhere: Secondary | ICD-10-CM | POA: Diagnosis not present

## 2018-05-19 ENCOUNTER — Telehealth: Payer: Self-pay | Admitting: Rheumatology

## 2018-05-19 NOTE — Telephone Encounter (Signed)
Patient ready to have next set of Euflexxa injections,  bil knees. Please call to advise.

## 2018-05-19 NOTE — Telephone Encounter (Signed)
Noted  

## 2018-05-26 DIAGNOSIS — R399 Unspecified symptoms and signs involving the genitourinary system: Secondary | ICD-10-CM | POA: Diagnosis not present

## 2018-05-29 ENCOUNTER — Telehealth (INDEPENDENT_AMBULATORY_CARE_PROVIDER_SITE_OTHER): Payer: Self-pay

## 2018-05-29 NOTE — Telephone Encounter (Signed)
Submitted for VOB, Euflexxa series, bilateral knee.

## 2018-05-30 DIAGNOSIS — R339 Retention of urine, unspecified: Secondary | ICD-10-CM | POA: Diagnosis not present

## 2018-05-30 DIAGNOSIS — N390 Urinary tract infection, site not specified: Secondary | ICD-10-CM | POA: Diagnosis not present

## 2018-06-03 DIAGNOSIS — N3080 Other cystitis without hematuria: Secondary | ICD-10-CM | POA: Diagnosis not present

## 2018-06-03 DIAGNOSIS — N281 Cyst of kidney, acquired: Secondary | ICD-10-CM | POA: Diagnosis not present

## 2018-06-03 DIAGNOSIS — N390 Urinary tract infection, site not specified: Secondary | ICD-10-CM | POA: Diagnosis not present

## 2018-06-05 ENCOUNTER — Encounter: Payer: Self-pay | Admitting: Rheumatology

## 2018-06-09 ENCOUNTER — Ambulatory Visit
Admission: RE | Admit: 2018-06-09 | Discharge: 2018-06-09 | Disposition: A | Discharge status: Home / Self Care | Payer: Medicare Other | Source: Ambulatory Visit | Attending: Family Medicine | Admitting: Family Medicine

## 2018-06-09 ENCOUNTER — Telehealth (INDEPENDENT_AMBULATORY_CARE_PROVIDER_SITE_OTHER): Payer: Self-pay

## 2018-06-09 DIAGNOSIS — Z1231 Encounter for screening mammogram for malignant neoplasm of breast: Secondary | ICD-10-CM | POA: Diagnosis not present

## 2018-06-09 NOTE — Telephone Encounter (Signed)
Please schedule patient appointment with Dr. Estanislado Pandy or Lovena Le.  Patient is approved for Euflexxa series, bilateral knee. Buy & Bill No Co-pay No PA required Thank You.

## 2018-06-10 NOTE — Telephone Encounter (Signed)
I LMOM for patient to call, and schedule bil knee injections, Euflexxa, b/b with Lovena Le.

## 2018-06-11 ENCOUNTER — Ambulatory Visit (INDEPENDENT_AMBULATORY_CARE_PROVIDER_SITE_OTHER): Payer: Medicare Other | Admitting: Physician Assistant

## 2018-06-11 DIAGNOSIS — M170 Bilateral primary osteoarthritis of knee: Secondary | ICD-10-CM | POA: Diagnosis not present

## 2018-06-11 DIAGNOSIS — R399 Unspecified symptoms and signs involving the genitourinary system: Secondary | ICD-10-CM | POA: Diagnosis not present

## 2018-06-11 MED ORDER — SODIUM HYALURONATE (VISCOSUP) 20 MG/2ML IX SOSY
20.0000 mg | PREFILLED_SYRINGE | INTRA_ARTICULAR | Status: AC | PRN
  Administered 2018-06-11: 20 mg via INTRA_ARTICULAR

## 2018-06-11 MED ORDER — LIDOCAINE HCL 1 % IJ SOLN
1.5000 mL | INTRAMUSCULAR | Status: AC | PRN
  Administered 2018-06-11: 1.5 mL

## 2018-06-11 NOTE — Progress Notes (Addendum)
   Procedure Note  Patient: MITHRA SPANO             Date of Birth: January 06, 1946           MRN: [id number]             Visit Date: 06/11/2018  Procedures: Visit Diagnoses: Primary osteoarthritis of both knees Euflexxa #1 Bilateral knee joint injections  Large Joint Inj: bilateral knee on 06/11/2018 9:40 AM Indications: pain Details: 27 G 1.5 in needle, medial approach  Arthrogram: No  Medications (Right): 20 mg Sodium Hyaluronate 20 MG/2ML; 1.5 mL lidocaine 1 % Aspirate (Right): 0 mL Medications (Left): 20 mg Sodium Hyaluronate 20 MG/2ML; 1.5 mL lidocaine 1 % Aspirate (Left): 0 mL Outcome: tolerated well, no immediate complications Procedure, treatment alternatives, risks and benefits explained, specific risks discussed. Consent was given by the patient. Immediately prior to procedure a time out was called to verify the correct patient, procedure, equipment, support staff and site/side marked as required. Patient was prepped and draped in the usual sterile fashion.     Lot: [id number] EXPIRATION DATE: 06/16/19  Patient tolerated the procedure well.   Hazel Sams, PA-C

## 2018-06-13 ENCOUNTER — Other Ambulatory Visit: Payer: Self-pay | Admitting: *Deleted

## 2018-06-13 DIAGNOSIS — Z79899 Other long term (current) drug therapy: Secondary | ICD-10-CM | POA: Diagnosis not present

## 2018-06-14 LAB — COMPLETE METABOLIC PANEL WITH GFR
AG Ratio: 1.4 (calc) (ref 1.0–2.5)
ALBUMIN MSPROF: 4.3 g/dL (ref 3.6–5.1)
ALT: 11 U/L (ref 6–29)
AST: 20 U/L (ref 10–35)
Alkaline phosphatase (APISO): 54 U/L (ref 33–130)
BUN: 10 mg/dL (ref 7–25)
CALCIUM: 9.7 mg/dL (ref 8.6–10.4)
CO2: 25 mmol/L (ref 20–32)
CREATININE: 0.77 mg/dL (ref 0.60–0.93)
Chloride: 98 mmol/L (ref 98–110)
GFR, EST NON AFRICAN AMERICAN: 78 mL/min/{1.73_m2} (ref >=60)
GFR, Est African American: 90 mL/min/{1.73_m2} (ref >=60)
GLOBULIN: 3.1 g/dL (ref 1.9–3.7)
Glucose, Bld: 100 mg/dL — ABNORMAL HIGH (ref 65–99)
Potassium: 4.1 mmol/L (ref 3.5–5.3)
SODIUM: 134 mmol/L — AB (ref 135–146)
TOTAL PROTEIN: 7.4 g/dL (ref 6.1–8.1)
Total Bilirubin: 0.4 mg/dL (ref 0.2–1.2)

## 2018-06-14 LAB — URINALYSIS, ROUTINE W REFLEX MICROSCOPIC
BACTERIA UA: NONE SEEN /HPF
Bilirubin Urine: NEGATIVE
Glucose, UA: NEGATIVE
HYALINE CAST: NONE SEEN /LPF
KETONES UR: NEGATIVE
Nitrite: NEGATIVE
PH: 7 (ref 5.0–8.0)
PROTEIN: NEGATIVE
Specific Gravity, Urine: 1.006 (ref 1.001–1.03)

## 2018-06-14 LAB — CBC WITH DIFFERENTIAL/PLATELET
Basophils Absolute: 49 cells/uL (ref 0–200)
Basophils Relative: 0.5 %
EOS ABS: 88 {cells}/uL (ref 15–500)
Eosinophils Relative: 0.9 %
HEMATOCRIT: 42.1 % (ref 35.0–45.0)
Hemoglobin: 14.4 g/dL (ref 11.7–15.5)
LYMPHS ABS: 1088 {cells}/uL (ref 850–3900)
MCH: 33 pg (ref 27.0–33.0)
MCHC: 34.2 g/dL (ref 32.0–36.0)
MCV: 96.3 fL (ref 80.0–100.0)
MPV: 11.1 fL (ref 7.5–12.5)
Monocytes Relative: 7.6 %
NEUTROS PCT: 79.9 %
Neutro Abs: 7830 cells/uL — ABNORMAL HIGH (ref 1500–7800)
Platelets: 267 10*3/uL (ref 140–400)
RBC: 4.37 10*6/uL (ref 3.80–5.10)
RDW: 12 % (ref 11.0–15.0)
Total Lymphocyte: 11.1 %
WBC mixed population: 745 cells/uL (ref 200–950)
WBC: 9.8 10*3/uL (ref 3.8–10.8)

## 2018-06-16 ENCOUNTER — Encounter: Payer: Self-pay | Admitting: Rheumatology

## 2018-06-16 NOTE — Patient Instructions (Signed)
*  Your Plaquenil eye exam is due in November.

## 2018-06-18 ENCOUNTER — Ambulatory Visit (INDEPENDENT_AMBULATORY_CARE_PROVIDER_SITE_OTHER): Payer: Medicare Other | Admitting: Rheumatology

## 2018-06-18 DIAGNOSIS — M170 Bilateral primary osteoarthritis of knee: Secondary | ICD-10-CM

## 2018-06-18 MED ORDER — LIDOCAINE HCL 1 % IJ SOLN
1.5000 mL | INTRAMUSCULAR | Status: AC | PRN
  Administered 2018-06-18: 1.5 mL

## 2018-06-18 MED ORDER — SODIUM HYALURONATE (VISCOSUP) 20 MG/2ML IX SOSY
20.0000 mg | PREFILLED_SYRINGE | INTRA_ARTICULAR | Status: AC | PRN
  Administered 2018-06-18: 20 mg via INTRA_ARTICULAR

## 2018-06-18 NOTE — Progress Notes (Signed)
   Procedure Note  Patient: [name]             Date of Birth: 02-08-1946           MRN: [id number]             Visit Date: 06/18/2018  Procedures: Visit Diagnoses: Primary osteoarthritis of both knees - Plan: Large Joint Inj: bilateral knee Euflexxa #2 bilateral B/B Large Joint Inj: bilateral knee on 06/18/2018 10:51 AM Indications: pain Details: 27 G 1.5 in needle, medial approach  Arthrogram: No  Medications (Right): 20 mg Sodium Hyaluronate 20 MG/2ML; 1.5 mL lidocaine 1 % Aspirate (Right): 0 mL Medications (Left): 20 mg Sodium Hyaluronate 20 MG/2ML; 1.5 mL lidocaine 1 % Aspirate (Left): 0 mL Outcome: tolerated well, no immediate complications Procedure, treatment alternatives, risks and benefits explained, specific risks discussed. Consent was given by the patient. Immediately prior to procedure a time out was called to verify the correct patient, procedure, equipment, support staff and site/side marked as required. Patient was prepped and draped in the usual sterile fashion.     Patient tolerated the procedure well.    Bo Merino, MD

## 2018-06-19 DIAGNOSIS — N390 Urinary tract infection, site not specified: Secondary | ICD-10-CM | POA: Diagnosis not present

## 2018-06-25 ENCOUNTER — Ambulatory Visit (INDEPENDENT_AMBULATORY_CARE_PROVIDER_SITE_OTHER): Payer: Medicare Other | Admitting: Rheumatology

## 2018-06-25 DIAGNOSIS — M170 Bilateral primary osteoarthritis of knee: Secondary | ICD-10-CM | POA: Diagnosis not present

## 2018-06-25 MED ORDER — LIDOCAINE HCL 1 % IJ SOLN
1.5000 mL | INTRAMUSCULAR | Status: AC | PRN
  Administered 2018-06-25: 1.5 mL

## 2018-06-25 MED ORDER — SODIUM HYALURONATE (VISCOSUP) 20 MG/2ML IX SOSY
20.0000 mg | PREFILLED_SYRINGE | INTRA_ARTICULAR | Status: AC | PRN
  Administered 2018-06-25: 20 mg via INTRA_ARTICULAR

## 2018-06-25 NOTE — Progress Notes (Signed)
   Procedure Note  Patient: [name]             Date of Birth: [birth year]           MRN: [id number]             Visit Date: 06/25/2018  Procedures: Visit Diagnoses: Primary osteoarthritis of both knees - Plan: Large Joint Inj: bilateral knee Euflexxa #3 bilateral B/B Large Joint Inj: bilateral knee on 06/25/2018 10:37 AM Indications: pain Details: 27 G 1.5 in needle, medial approach  Arthrogram: No  Medications (Right): 20 mg Sodium Hyaluronate 20 MG/2ML; 1.5 mL lidocaine 1 % Aspirate (Right): 0 mL Medications (Left): 20 mg Sodium Hyaluronate 20 MG/2ML; 1.5 mL lidocaine 1 % Aspirate (Left): 0 mL Outcome: tolerated well, no immediate complications Procedure, treatment alternatives, risks and benefits explained, specific risks discussed. Consent was given by the patient. Immediately prior to procedure a time out was called to verify the correct patient, procedure, equipment, support staff and site/side marked as required. Patient was prepped and draped in the usual sterile fashion.    Bo Merino, MD  Patient tolerated the procedure well.

## 2018-06-27 DIAGNOSIS — N390 Urinary tract infection, site not specified: Secondary | ICD-10-CM | POA: Diagnosis not present

## 2018-07-05 DIAGNOSIS — Z23 Encounter for immunization: Secondary | ICD-10-CM | POA: Diagnosis not present

## 2018-07-07 DIAGNOSIS — R399 Unspecified symptoms and signs involving the genitourinary system: Secondary | ICD-10-CM | POA: Diagnosis not present

## 2018-07-13 ENCOUNTER — Other Ambulatory Visit: Payer: Self-pay | Admitting: Physician Assistant

## 2018-07-14 DIAGNOSIS — R399 Unspecified symptoms and signs involving the genitourinary system: Secondary | ICD-10-CM | POA: Diagnosis not present

## 2018-07-14 NOTE — Telephone Encounter (Signed)
Last Visit: 01/23/18 Next visit: 07/25/18  Okay to refill per Dr. Estanislado Pandy

## 2018-07-14 NOTE — Progress Notes (Signed)
Office Visit Note  Patient: [name]             Date of Birth: [birth year]           MRN: [id number]             PCP: Cari Caraway, MD Referring: Cari Caraway, MD Visit Date: 07/25/2018 Occupation: @GUAROCC @  Subjective:  Sicca symptoms   History of Present Illness: [name] is a 72 y.o. female with history of autoimmune disease, Sjogren's syndrome, and osteoarthritis.  She is on Plaquenil 200 mg by mouth twice daily Monday-Friday.   She has not had any recent flares of autoimmune disease.  She has not had any recent rashes or symptoms of Raynolds.  She denies any sores in her mouth or nose.  She continues to have sicca symptoms.  She uses an eye lubricant at bedtime as well as first thing in the morning as well as using Biotene products.  She sees her dentist 3 times yearly.  She states that her last visit she did have some dental caries.  She reports that she is currently on low-dose Nitrofurantoin for recurrent UTIs.  She will be following up with urology November 2019.  She states that she continues to have left trochanteric bursitis pain at night.  She has been to physical therapy as well as seeing Dr. Wynelle Link.  She reports that physical therapy helped her symptoms considerably.  She states that she does have stiffness in her knee joints if she is sitting for prolonged peers of time.  She denies any knee joint pain or swelling at this time.  She states that if she is going down steps she has a sensation of her knees going to give out on her.  She reports that Visco gel injections helped her knee discomfort.  She denies any other joint pain or joint swelling at this time.  Activities of Daily Living:  Patient reports morning stiffness for 5 minutes.   Patient Reports nocturnal pain.  Difficulty dressing/grooming: Denies Difficulty climbing stairs: Denies Difficulty getting out of chair: Denies Difficulty using hands for taps, buttons, cutlery, and/or writing:  Denies  Review of Systems  Constitutional: Negative for fatigue.  HENT: Positive for mouth dryness. Negative for mouth sores and nose dryness.   Eyes: Positive for dryness. Negative for pain and visual disturbance.  Respiratory: Negative for cough, hemoptysis, shortness of breath and difficulty breathing.   Cardiovascular: Negative for chest pain, palpitations, hypertension and swelling in legs/feet.  Gastrointestinal: Negative for blood in stool, constipation and diarrhea.  Endocrine: Negative for increased urination.  Genitourinary: Negative for difficulty urinating and painful urination.  Musculoskeletal: Positive for morning stiffness. Negative for arthralgias, joint pain, joint swelling, myalgias, muscle weakness, muscle tenderness and myalgias.  Skin: Negative for color change, pallor, rash, hair loss, nodules/bumps, skin tightness, ulcers and sensitivity to sunlight.  Allergic/Immunologic: Negative for susceptible to infections.  Neurological: Negative for dizziness, numbness, headaches and weakness.  Hematological: Negative for bruising/bleeding tendency and swollen glands.  Psychiatric/Behavioral: Negative for depressed mood and sleep disturbance. The patient is not nervous/anxious.     PMFS History:  Patient Active Problem List   Diagnosis Date Noted  . Osteopenia of multiple sites 08/22/2017  . History of vitamin D deficiency 08/22/2017  . Autoimmune disease ([place]) 10/23/2016  . Sjogren's syndrome ([place]) 10/09/2016  . Primary osteoarthritis of both hands 10/09/2016  . Primary osteoarthritis of both knees 10/09/2016  . High risk medication use 09/12/2016  . SBO (  small bowel obstruction), partial postop 10/16/2011  . 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 Allergy   . Colon polyp   . Environmental allergies   . Hypothyroid   . Osteoarthritis   . Osteoarthritis   .  Osteoarthritis   . Osteopenia   . Raynauds phenomenon   . 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 Sjogren's syndrome ([place])   . Systemic lupus ([facility])     Family History  Problem Relation Age of Onset  . Heart disease Mother   . Psoriasis Father   . Arthritis Father   . Heart disease Brother   . Heart attack Brother   . Diabetes Maternal Grandfather   . Cancer Maternal Uncle        colon  . Colon cancer Maternal Uncle 68  . Asthma Sister   . Breast cancer Neg Hx    Past Surgical History:  Procedure Laterality Date  . APPENDECTOMY    . BUNIONECTOMY  2008   right foot  . CATARACT EXTRACTION    . COLONOSCOPY W/ POLYPECTOMY  Aug 27, 2011  . ENDOMETRIAL ABLATION    . FOOT SURGERY Right 12/17/2017   bone spur  . LAPAROSCOPIC APPENDECTOMY  10/11/2011   Procedure: APPENDECTOMY LAPAROSCOPIC;  Surgeon: Adin Hector, MD;  Location: [facility];  Service: General;  Laterality: N/A;  Partial Cecectomy and Appendectomy  . WISDOM TOOTH EXTRACTION     Social History   Social History Narrative  . Not on file    Objective: Vital Signs: BP 117/66 (BP Location: Left Arm, Patient Position: Sitting, Cuff Size: Normal)   Pulse (!) 57   Resp 14   Ht 5' 6.5" (1.689 m)   Wt 152 lb 12.8 oz (69.3 kg)   BMI 24.29 kg/m    Physical Exam  Constitutional: She is oriented to person, place, and time. She appears well-developed and well-nourished.  HENT:  Head: Normocephalic and atraumatic.  No oral or nasal ulcerations present.  No parotid swelling noted.  No temporal artery tenderness.  Eyes: Conjunctivae and EOM are normal.  Neck: Normal range of motion.  Cardiovascular: Normal rate, regular rhythm, normal heart sounds and intact distal pulses.  Pulmonary/Chest: Effort normal and breath sounds normal.  Abdominal: Soft. Bowel sounds are normal.  Lymphadenopathy:    She has no cervical adenopathy.  Neurological: She is alert and oriented to person, place, and time.  Skin: Skin is  warm and dry. Capillary refill takes 2 to 3 seconds.  No malar rash or skin lesions present.  No digital ulcerations or signs of gangrene.  Psychiatric: She has a normal mood and affect. Her behavior is normal.  Nursing note and vitals reviewed.    Musculoskeletal Exam: C-spine, thoracic spine, lumbar spine good range of motion.  No midline spinal tenderness.  No SI joint tenderness.  Shoulder joints, elbow joints, wrist joints, MCPs, PIPs, DIPs good range of motion no synovitis.  Hip joints, knee joints, ankle joints, MTPs, PIPs, DIPs good range of motion no synovitis.  No warmth or effusion bilateral knee joints.  She has bilateral knee crepitus.  No tenderness of trochanteric bursa bilaterally.  She has synovial thickening of bilateral first MTP joints.  CDAI Exam: CDAI Score: Not documented Patient Global Assessment: Not documented; Provider Global Assessment: Not documented Swollen: Not documented; Tender: Not documented Joint Exam   Not documented   There is currently  no information documented on the homunculus. Go to the Rheumatology activity and complete the homunculus joint exam.  Investigation: No additional findings.  Imaging: No results found.  Recent Labs: Lab Results  Component Value Date   WBC 9.8 06/13/2018   HGB 14.4 06/13/2018   PLT 267 06/13/2018   NA 134 (L) 06/13/2018   K 4.1 06/13/2018   CL 98 06/13/2018   CO2 25 06/13/2018   GLUCOSE 100 (H) 06/13/2018   BUN 10 06/13/2018   CREATININE 0.77 06/13/2018   BILITOT 0.4 06/13/2018   ALKPHOS 48 02/11/2017   AST 20 06/13/2018   ALT 11 06/13/2018   PROT 7.4 06/13/2018   ALBUMIN 3.9 02/11/2017   CALCIUM 9.7 06/13/2018   GFRAA 90 06/13/2018    Speciality Comments: PLQ eye exam: 09/02/2017 Normal. Dr. Rutherford Guys. Follow up in 1 year.  Procedures:  No procedures performed Allergies: Sulfonamide derivatives   Assessment / Plan:     Visit Diagnoses: Autoimmune disease ([place]) - She has history of positive  ANA and Raynaud's phenomena and arthritis: She is clinically doing well on Plaquenil 200 mg 1 tablet by mouth twice daily Monday through Friday.  She has no synovitis on exam.  She has no joint pain or joint swelling at this time.  She has very minimal morning stiffness in her joints.  She has no oral or nasal ulcerations on exam.  No malar rash or skin lesions noted.  She does not experience any symptoms of Raynaud's recently.  No digital ulcerations or signs of gangrene noted.  Capillary refill 2 to 3 seconds.  She continues to have sicca symptoms.  She sees her dentist 3 times yearly.  At her last visit she did have some dental caries.  She continues to use Systane eyedrops as well as Biotene products.  No parotid swelling was noted on exam.  She has not had any worsening fatigue, low-grade fevers, or cervical lymphadenopathy.  She had her flu vaccination on June 23, 2018.  She will continue taking Plaquenil 200 mg 1 tablet twice daily Monday through Friday.  She does not need any refills at this time.  She is advised to notify us if she develops increased joint pain or joint swelling.  She will follow-up in the office in 5 months.  High risk medication use - PLQ. eye exam: 09/02/2017.  CBC and CMP are stable on 06/13/2018.  Sjogren's syndrome with keratoconjunctivitis sicca ([place]): She continues to have sicca symptoms.  She has no parotid swelling on exam.  She is Systane eyedrops as well as Biotene products.  She sees her dentist 3 times daily.  She recently dental caries.  We discussed the importance of good oral hygiene.   Primary osteoarthritis of both hands: She has mild PIP and DIP synovial thickening.  She has complete fist formation.  She has no joint pain at this time.   Primary osteoarthritis of both knees: No warmth or effusion.  Good ROM with no discomfort.  Bilateral knee crepitus.  She had bilateral Euflexxa injections at the end of August early September.  She has no discomfort in her  knee joints at this time.  She occasionally experiences a sensation of her knees when to give out on her when she is going down steps.  Osteopenia of multiple sites - Her PCP has recently started her on Boniva on December 20, 2017.  She is taking vitamin D.  History of depression - Well controlled on lexapro  History of anxiety -  Well controlled on lexapro  History of vitamin D deficiency: She takes a vitamin D supplement daily.  Orders: No orders of the defined types were placed in this encounter.  No orders of the defined types were placed in this encounter.    Follow-Up Instructions: Return in about 5 months (around 12/24/2018) for Autoimmune Disease, Sjogren's syndrome, Osteoarthritis.   Ofilia Neas, PA-C  I examined and evaluated the patient with Hazel Sams PA.  She continues to have some sicca symptoms.  She also have some discomfort from underlying osteoarthritis.  She has no synovitis on my examination.  Her autoimmune work-up has been negative recently.  She has been tolerating Plaquenil well.  The plan of care was discussed as noted above.  Bo Merino, MD Note - This record has been created using Editor, commissioning.  Chart creation errors have been sought, but may not always  have been located. Such creation errors do not reflect on  the standard of medical care.

## 2018-07-25 ENCOUNTER — Ambulatory Visit (INDEPENDENT_AMBULATORY_CARE_PROVIDER_SITE_OTHER): Payer: Medicare Other | Admitting: Rheumatology

## 2018-07-25 ENCOUNTER — Encounter: Payer: Self-pay | Admitting: Rheumatology

## 2018-07-25 VITALS — BP 117/66 | HR 57 | Resp 14 | Ht 66.5 in | Wt 152.8 lb

## 2018-07-25 DIAGNOSIS — M3501 Sicca syndrome with keratoconjunctivitis: Secondary | ICD-10-CM | POA: Diagnosis not present

## 2018-07-25 DIAGNOSIS — Z79899 Other long term (current) drug therapy: Secondary | ICD-10-CM

## 2018-07-25 DIAGNOSIS — M19041 Primary osteoarthritis, right hand: Secondary | ICD-10-CM

## 2018-07-25 DIAGNOSIS — M170 Bilateral primary osteoarthritis of knee: Secondary | ICD-10-CM | POA: Diagnosis not present

## 2018-07-25 DIAGNOSIS — Z8639 Personal history of other endocrine, nutritional and metabolic disease: Secondary | ICD-10-CM | POA: Diagnosis not present

## 2018-07-25 DIAGNOSIS — M19042 Primary osteoarthritis, left hand: Secondary | ICD-10-CM | POA: Diagnosis not present

## 2018-07-25 DIAGNOSIS — Z8659 Personal history of other mental and behavioral disorders: Secondary | ICD-10-CM | POA: Diagnosis not present

## 2018-07-25 DIAGNOSIS — M359 Systemic involvement of connective tissue, unspecified: Secondary | ICD-10-CM

## 2018-07-25 DIAGNOSIS — M8589 Other specified disorders of bone density and structure, multiple sites: Secondary | ICD-10-CM

## 2018-07-25 NOTE — Patient Instructions (Signed)
*  Your next Plaquenil eye exam is due in November.  Please have your ophthalmologist fax [place] the results of your exam to [phone]*

## 2018-07-28 ENCOUNTER — Encounter: Payer: Self-pay | Admitting: Rheumatology

## 2018-09-02 ENCOUNTER — Institutional Professional Consult (permissible substitution): Payer: Self-pay | Admitting: Plastic Surgery

## 2018-09-04 DIAGNOSIS — N390 Urinary tract infection, site not specified: Secondary | ICD-10-CM | POA: Diagnosis not present

## 2018-09-04 DIAGNOSIS — N952 Postmenopausal atrophic vaginitis: Secondary | ICD-10-CM | POA: Diagnosis not present

## 2018-09-08 DIAGNOSIS — Z79899 Other long term (current) drug therapy: Secondary | ICD-10-CM | POA: Diagnosis not present

## 2018-09-08 DIAGNOSIS — H524 Presbyopia: Secondary | ICD-10-CM | POA: Diagnosis not present

## 2018-09-08 DIAGNOSIS — M3210 Systemic lupus erythematosus, organ or system involvement unspecified: Secondary | ICD-10-CM | POA: Diagnosis not present

## 2018-09-08 DIAGNOSIS — Z961 Presence of intraocular lens: Secondary | ICD-10-CM | POA: Diagnosis not present

## 2018-09-09 ENCOUNTER — Ambulatory Visit (INDEPENDENT_AMBULATORY_CARE_PROVIDER_SITE_OTHER): Payer: Self-pay | Admitting: Plastic Surgery

## 2018-09-09 ENCOUNTER — Encounter: Payer: Self-pay | Admitting: Plastic Surgery

## 2018-09-09 VITALS — BP 132/80 | HR 72 | Ht 66.0 in | Wt 152.0 lb

## 2018-09-09 DIAGNOSIS — Z719 Counseling, unspecified: Secondary | ICD-10-CM

## 2018-09-09 DIAGNOSIS — M3501 Sicca syndrome with keratoconjunctivitis: Secondary | ICD-10-CM

## 2018-09-09 NOTE — Progress Notes (Signed)
Patient ID: [name], female    DOB: [birth year], 72 y.o.   MRN: [id number]   Chief Complaint  Patient presents with  . Advice Only    The patient is a 72 year old white female here for evaluation for facial rejuvenation.  She looks remarkably good for her age and has not had any previous rejuvenation surgery.  She does have multiple medical conditions including lupus and Sojourn's.  She is interested and an upper lid blepharoplasty to relieve the excess skin, a lower lid blepharoplasty for smoothing out the fat pads and a forehead lift.  Overall her excess skin is minimal for her age.  Her vision is stable and she states her medical conditions are well handled at this time.  Her husband does not really want her to have a facelift.  She does have access skin although not bad for her age on the upper lids with very little fat herniation.  The lower lids there is not much skin redundancy just fat herniation.  She would see some improvement from the forehead left as well but could also get improvement in the glabella and forehead wrinkles with Botox.  She has mild ptosis of the malar fat pads which would respond well to filler.   Review of Systems  Constitutional: Negative for activity change and appetite change.  HENT: Negative.   Eyes: Negative.   Respiratory: Negative.   Gastrointestinal: Negative.   Endocrine: Negative.   Genitourinary: Negative.   Musculoskeletal: Positive for joint swelling.  Skin: Positive for color change. Negative for wound.  Neurological: Negative.   Psychiatric/Behavioral: Negative.     Past Medical History:  Diagnosis Date  . Allergy   . Colon polyp   . Environmental allergies   . Hypothyroid   . Osteoarthritis   . Osteoarthritis   . Osteoarthritis   . Osteopenia   . Raynauds phenomenon   . 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 Sjogren's syndrome ([place])   . Systemic lupus ([place])     Past Surgical History:  Procedure Laterality  Date  . APPENDECTOMY    . BUNIONECTOMY  2008   right foot  . CATARACT EXTRACTION    . COLONOSCOPY W/ POLYPECTOMY  Aug 27, 2011  . ENDOMETRIAL ABLATION    . FOOT SURGERY Right 12/17/2017   bone spur  . LAPAROSCOPIC APPENDECTOMY  10/11/2011   Procedure: APPENDECTOMY LAPAROSCOPIC;  Surgeon: Adin Hector, MD;  Location: [facility];  Service: General;  Laterality: N/A;  Partial Cecectomy and Appendectomy  . WISDOM TOOTH EXTRACTION        Current Outpatient Medications:  .  aspirin 81 MG tablet, Take 81 mg by mouth at bedtime. , Disp: , Rfl:  .  calcium carbonate (OS-CAL) 600 MG tablet, 1 tablet with meals, Disp: , Rfl:  .  Cholecalciferol (VITAMIN D) 2000 units CAPS, Take by mouth daily., Disp: , Rfl:  .  escitalopram (LEXAPRO) 10 MG tablet, TAKE 1 TABLET DAILY, Disp: 90 tablet, Rfl: 0 .  fexofenadine (ALLEGRA) 180 MG tablet, Take 180 mg by mouth daily. , Disp: , Rfl:  .  fish oil-omega-3 fatty acids 1000 MG capsule, Take 2 g by mouth daily. , Disp: , Rfl:  .  fluticasone (FLONASE) 50 MCG/ACT nasal spray, 2 sprays in each nostril, Disp: , Rfl:  .  folic acid (FOLVITE) 1 MG tablet, Take 0.8 mg by mouth daily. , Disp: , Rfl:  .  hydroxychloroquine (PLAQUENIL) 200 MG tablet, Take 1 tablet (  200 mg total) by mouth 2 (two) times daily. (Patient taking differently: Take 200 mg by mouth 2 (two) times daily. ), Disp: 180 tablet, Rfl: 1 .  ibandronate (BONIVA) 150 MG tablet, every 30 (thirty) days., Disp: , Rfl: 1 .  levothyroxine (SYNTHROID, LEVOTHROID) 88 MCG tablet, Take 88 mcg by mouth every morning. , Disp: , Rfl:  .  Multiple Vitamin (MULTIVITAMIN) tablet, Take 1 tablet by mouth daily. , Disp: , Rfl:  .  nitrofurantoin (MACRODANTIN) 50 MG capsule, Take 50 mg by mouth daily., Disp: , Rfl:  .  omega-3 acid ethyl esters (LOVAZA) 1 g capsule, Take by mouth., Disp: , Rfl:  .  Polyethyl Glycol-Propyl Glycol (SYSTANE ULTRA) 0.4-0.3 % SOLN, Apply 1 drop to eye 2 (two) times daily. , Disp: , Rfl:  .   Probiotic Product (PROBIOTIC DAILY) CAPS, Take 1 capsule by mouth., Disp: , Rfl:  .  Thiamine HCl (VITAMIN B-1) 100 MG tablet, Take 100 mg by mouth daily. , Disp: , Rfl:  .  triamcinolone cream (KENALOG) 0.1 %, 1 application to affected area, Disp: , Rfl:  .  TURMERIC PO, 1-2  capsule 400mg , Disp: , Rfl:    Objective:   Vitals:   09/09/18 1438  BP: 132/80  Pulse: 72  SpO2: 100%    Physical Exam  Constitutional: She is oriented to person, place, and time. She appears well-developed and well-nourished.  HENT:  Head: Normocephalic.  Eyes: Pupils are equal, round, and reactive to light. EOM are normal.  Cardiovascular: Normal rate.  Pulmonary/Chest: Effort normal.  Neurological: She is alert and oriented to person, place, and time.  Psychiatric: She has a normal mood and affect. Her behavior is normal. Judgment and thought content normal.    Assessment & Plan:  Sjogren's syndrome with keratoconjunctivitis sicca ([place])  Encounter for counseling  Keeping in mind her medical condition I think it is risky to do the forehead left because of the amount of tissue release needed.  She is at increased risk overall with any surgery in regards to her ability to heal due to the medical conditions and the treatment for the medical conditions.  I think that it is within reason to do the blepharoplasty because of the nature of the thin skin and well vascularization in regards to healing.  I do want her to talk to her husband prior to committing to anything.  She would like a "for everything and then will let us know.  She is a very nice lady and think that she would do well with conservative treatment with skin creams and may be even laser for the hyperpigmentation.  [place], DO

## 2018-10-24 ENCOUNTER — Encounter: Payer: Self-pay | Admitting: Rheumatology

## 2018-10-24 DIAGNOSIS — M359 Systemic involvement of connective tissue, unspecified: Secondary | ICD-10-CM

## 2018-10-24 MED ORDER — ESCITALOPRAM OXALATE 10 MG PO TABS: 10.0000 mg | ORAL_TABLET | Freq: Every day | ORAL | 0 refills | Status: DC

## 2018-10-24 MED ORDER — HYDROXYCHLOROQUINE SULFATE 200 MG PO TABS: ORAL_TABLET | ORAL | 0 refills | Status: DC

## 2018-10-24 NOTE — Telephone Encounter (Signed)
Prescriptions refill request received via fax for PLQ and Escitalopram.   Last visit: 07/25/18 Next visit: 12/24/18 Labs: 06/13/18 cbc/cmp stable PLQ eye exam: 09/08/18  Normal  Okay to refill per Dr Estanislado Pandy

## 2018-10-28 ENCOUNTER — Telehealth: Payer: Self-pay | Admitting: Rheumatology

## 2018-10-28 ENCOUNTER — Other Ambulatory Visit: Payer: Self-pay | Admitting: *Deleted

## 2018-10-28 DIAGNOSIS — Z79899 Other long term (current) drug therapy: Secondary | ICD-10-CM

## 2018-10-28 NOTE — Telephone Encounter (Signed)
Patient ready to start process for authorization on next Euflexxa injections. Please call to advise

## 2018-10-29 LAB — CBC WITH DIFFERENTIAL/PLATELET
Absolute Monocytes: 452 cells/uL (ref 200–950)
Basophils Absolute: 41 cells/uL (ref 0–200)
Basophils Relative: 0.7 %
Eosinophils Absolute: 128 cells/uL (ref 15–500)
Eosinophils Relative: 2.2 %
HEMATOCRIT: 42.6 % (ref 35.0–45.0)
Hemoglobin: 14.4 g/dL (ref 11.7–15.5)
LYMPHS ABS: 748 {cells}/uL — AB (ref 850–3900)
MCH: 33 pg (ref 27.0–33.0)
MCHC: 33.8 g/dL (ref 32.0–36.0)
MCV: 97.5 fL (ref 80.0–100.0)
MPV: 10.8 fL (ref 7.5–12.5)
Monocytes Relative: 7.8 %
NEUTROS ABS: 4431 {cells}/uL (ref 1500–7800)
Neutrophils Relative %: 76.4 %
Platelets: 279 10*3/uL (ref 140–400)
RBC: 4.37 10*6/uL (ref 3.80–5.10)
RDW: 11.8 % (ref 11.0–15.0)
Total Lymphocyte: 12.9 %
WBC: 5.8 10*3/uL (ref 3.8–10.8)

## 2018-10-29 LAB — COMPLETE METABOLIC PANEL WITH GFR
AG Ratio: 1.5 (calc) (ref 1.0–2.5)
ALKALINE PHOSPHATASE (APISO): 55 U/L (ref 33–130)
ALT: 13 U/L (ref 6–29)
AST: 21 U/L (ref 10–35)
Albumin: 4.2 g/dL (ref 3.6–5.1)
BILIRUBIN TOTAL: 0.5 mg/dL (ref 0.2–1.2)
BUN: 14 mg/dL (ref 7–25)
CHLORIDE: 108 mmol/L (ref 98–110)
CO2: 25 mmol/L (ref 20–32)
CREATININE: 0.72 mg/dL (ref 0.60–0.93)
Calcium: 9.2 mg/dL (ref 8.6–10.4)
GFR, Est African American: 97 mL/min/{1.73_m2} (ref >=60)
GFR, Est Non African American: 84 mL/min/{1.73_m2} (ref >=60)
GLUCOSE: 91 mg/dL (ref 65–99)
Globulin: 2.8 g/dL (calc) (ref 1.9–3.7)
Potassium: 3.8 mmol/L (ref 3.5–5.3)
Sodium: 142 mmol/L (ref 135–146)
Total Protein: 7 g/dL (ref 6.1–8.1)

## 2018-10-29 NOTE — Progress Notes (Signed)
stable °

## 2018-10-30 DIAGNOSIS — M25552 Pain in left hip: Secondary | ICD-10-CM | POA: Diagnosis not present

## 2018-11-04 ENCOUNTER — Encounter: Payer: Self-pay | Admitting: Rheumatology

## 2018-11-05 NOTE — Telephone Encounter (Signed)
Please schedule Visco supplement injections in February 2020 for bilateral knees.

## 2018-11-05 NOTE — Telephone Encounter (Signed)
Please schedule Visco supplement injections in February.  Her last injections were June 11, 2018.

## 2018-11-11 DIAGNOSIS — M25559 Pain in unspecified hip: Secondary | ICD-10-CM | POA: Diagnosis not present

## 2018-11-17 DIAGNOSIS — L821 Other seborrheic keratosis: Secondary | ICD-10-CM | POA: Diagnosis not present

## 2018-11-17 DIAGNOSIS — L814 Other melanin hyperpigmentation: Secondary | ICD-10-CM | POA: Diagnosis not present

## 2018-11-17 DIAGNOSIS — Z85828 Personal history of other malignant neoplasm of skin: Secondary | ICD-10-CM | POA: Diagnosis not present

## 2018-11-17 DIAGNOSIS — D225 Melanocytic nevi of trunk: Secondary | ICD-10-CM | POA: Diagnosis not present

## 2018-11-17 DIAGNOSIS — Z23 Encounter for immunization: Secondary | ICD-10-CM | POA: Diagnosis not present

## 2018-11-19 DIAGNOSIS — M25559 Pain in unspecified hip: Secondary | ICD-10-CM | POA: Diagnosis not present

## 2018-12-01 ENCOUNTER — Telehealth (INDEPENDENT_AMBULATORY_CARE_PROVIDER_SITE_OTHER): Payer: Self-pay

## 2018-12-01 NOTE — Telephone Encounter (Signed)
Submitted VOB for Euflexxa, bilateral knee. 

## 2018-12-01 NOTE — Telephone Encounter (Signed)
Please make sure that you send a telephone call to apply for gel injections.  I'm just seeing the message to apply for Euflexxa. Thank you.

## 2018-12-02 ENCOUNTER — Telehealth (INDEPENDENT_AMBULATORY_CARE_PROVIDER_SITE_OTHER): Payer: Self-pay

## 2018-12-02 NOTE — Telephone Encounter (Signed)
Please schedule patient an appointment with Dr. Estanislado Pandy or Lovena Le for gel injection.  Thank You.  Approved for Euflexxa series, bilateral knee. Buy & Bill Covered at 100% through her insurance. No Co-pay No PA required  Appt.needs to be after 12/24/2018.

## 2018-12-05 DIAGNOSIS — M25552 Pain in left hip: Secondary | ICD-10-CM | POA: Diagnosis not present

## 2018-12-08 ENCOUNTER — Encounter: Payer: Self-pay | Admitting: Rheumatology

## 2018-12-11 NOTE — Progress Notes (Deleted)
Office Visit Note  Patient: [name]             Date of Birth: 16-Jan-1946           MRN: [id number]             PCP: Cari Caraway, MD Referring: Cari Caraway, MD Visit Date: 12/24/2018 Occupation: @GUAROCC @  Subjective:  No chief complaint on file.  Plaquenil 200 mg twice daily Monday through Friday only.  Last Plaquenil eye exam normal on 09/08/2018 and will monitor yearly.  Most recent CBC/CMP stable on 10/28/2018 and will monitor every 5 months.  She is currently on Boniva monthly which is managed by her PCP.   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  No significant changes in left hip BMD since prior exam in 11/03/15.   History of Present Illness: ANGELI Ruiz is a 73 y.o. female***  Activities of Daily Living:  Patient reports morning stiffness for *** {minute/hour:19697}.   Patient {ACTIONS;DENIES/REPORTS:21021675::"Denies"} nocturnal pain.  Difficulty dressing/grooming: {ACTIONS;DENIES/REPORTS:21021675::"Denies"} Difficulty climbing stairs: {ACTIONS;DENIES/REPORTS:21021675::"Denies"} Difficulty getting out of chair: {ACTIONS;DENIES/REPORTS:21021675::"Denies"} Difficulty using hands for taps, buttons, cutlery, and/or writing: {ACTIONS;DENIES/REPORTS:21021675::"Denies"}  No Rheumatology ROS completed.   PMFS History:  Patient Active Problem List   Diagnosis Date Noted  . Encounter for counseling 09/09/2018  . Osteopenia of multiple sites 08/22/2017  . History of vitamin D deficiency 08/22/2017  . Autoimmune disease ([place]) 10/23/2016  . Sjogren's syndrome ([place]) 10/09/2016  . Primary osteoarthritis of both hands 10/09/2016  . Primary osteoarthritis of both knees 10/09/2016  . High risk medication use 09/12/2016  . SBO (small bowel obstruction), partial postop 10/16/2011  . 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 Allergy   . Colon polyp   . Environmental allergies   . Hypothyroid   . Osteoarthritis   . Osteoarthritis   . Osteoarthritis   . Osteopenia   . Raynauds phenomenon   . 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 Sjogren's syndrome ([place])   . Systemic lupus ([facility])     Family History  Problem Relation Age of Onset  . Heart disease Mother   . Psoriasis Father   . Arthritis Father   . Heart disease Brother   . Heart attack Brother   . Diabetes Maternal Grandfather   . Cancer Maternal Uncle        colon  . Colon cancer Maternal Uncle 69  . Asthma Sister   . Breast cancer Neg Hx    Past Surgical History:  Procedure Laterality Date  . APPENDECTOMY    . BUNIONECTOMY  2008   right foot  . CATARACT EXTRACTION    . COLONOSCOPY W/ POLYPECTOMY  Aug 27, 2011  . ENDOMETRIAL ABLATION    . FOOT SURGERY Right 12/17/2017   bone spur  . LAPAROSCOPIC APPENDECTOMY  10/11/2011   Procedure: APPENDECTOMY LAPAROSCOPIC;  Surgeon: Adin Hector, MD;  Location: [facility];  Service: General;  Laterality: N/A;  Partial Cecectomy and Appendectomy  . WISDOM TOOTH EXTRACTION     Social History   Social History Narrative  . Not on file   Immunization History  Administered Date(s) Administered  . Influenza, High Dose Seasonal PF 07/05/2018  . Influenza,inj,quad, With Preservative 07/10/2017     Objective: Vital Signs: There were no vitals taken for this visit.   Physical  Exam   Musculoskeletal Exam: ***  CDAI Exam: CDAI Score: Not documented Patient Global Assessment: Not documented; Provider Global Assessment: Not documented Swollen: Not documented; Tender: Not documented Joint Exam   Not documented   There is currently no information documented on the homunculus. Go to the Rheumatology activity and complete the homunculus joint exam.  Investigation: No additional findings.  Imaging: No results found.  Recent Labs: Lab Results  Component Value Date   WBC 5.8  10/28/2018   HGB 14.4 10/28/2018   PLT 279 10/28/2018   NA 142 10/28/2018   K 3.8 10/28/2018   CL 108 10/28/2018   CO2 25 10/28/2018   GLUCOSE 91 10/28/2018   BUN 14 10/28/2018   CREATININE 0.72 10/28/2018   BILITOT 0.5 10/28/2018   ALKPHOS 48 02/11/2017   AST 21 10/28/2018   ALT 13 10/28/2018   PROT 7.0 10/28/2018   ALBUMIN 3.9 02/11/2017   CALCIUM 9.2 10/28/2018   GFRAA 97 10/28/2018    Speciality Comments: PLQ eye exam: 09/08/18  Normal. Dr. Rutherford Guys. Follow up in 1 year.  Procedures:  No procedures performed Allergies: Sulfonamide derivatives   Assessment / Plan:     Visit Diagnoses: No diagnosis found.   Orders: No orders of the defined types were placed in this encounter.  No orders of the defined types were placed in this encounter.   Face-to-face time spent with patient was *** minutes. Greater than 50% of time was spent in counseling and coordination of care.  Follow-Up Instructions: No follow-ups on file.   Earnestine Mealing, CMA  Note - This record has been created using Editor, commissioning.  Chart creation errors have been sought, but may not always  have been located. Such creation errors do not reflect on  the standard of medical care.

## 2018-12-16 DIAGNOSIS — L602 Onychogryphosis: Secondary | ICD-10-CM | POA: Diagnosis not present

## 2018-12-16 DIAGNOSIS — L603 Nail dystrophy: Secondary | ICD-10-CM | POA: Diagnosis not present

## 2018-12-16 DIAGNOSIS — L03031 Cellulitis of right toe: Secondary | ICD-10-CM | POA: Diagnosis not present

## 2018-12-16 DIAGNOSIS — L02611 Cutaneous abscess of right foot: Secondary | ICD-10-CM | POA: Diagnosis not present

## 2018-12-16 DIAGNOSIS — M79674 Pain in right toe(s): Secondary | ICD-10-CM | POA: Diagnosis not present

## 2018-12-16 HISTORY — PX: OTHER SURGICAL HISTORY: SHX169

## 2018-12-21 ENCOUNTER — Other Ambulatory Visit: Payer: Self-pay | Admitting: Rheumatology

## 2018-12-22 ENCOUNTER — Other Ambulatory Visit: Payer: Self-pay | Admitting: Rheumatology

## 2018-12-22 DIAGNOSIS — L603 Nail dystrophy: Secondary | ICD-10-CM | POA: Diagnosis not present

## 2018-12-22 DIAGNOSIS — M359 Systemic involvement of connective tissue, unspecified: Secondary | ICD-10-CM

## 2018-12-22 NOTE — Telephone Encounter (Signed)
Last Visit: 07/25/18 Next Visit: 12/24/18 Labs: 10/28/18 stable PLQ eye exam: 09/08/18  Normal.  Okay to refill per Dr. Estanislado Pandy

## 2018-12-23 NOTE — Progress Notes (Signed)
Office Visit Note  Patient: [name]             Date of Birth: June 17, 1946           MRN: [id number]             PCP: Cari Caraway, MD Referring: Cari Caraway, MD Visit Date: 12/24/2018 Occupation: @GUAROCC @  Subjective:  Sicca symptoms   History of Present Illness: [name] is a 73 y.o. female with history of autoimmune disease, Sjogren's, and osteoarthritis.  She is taking PLQ 200 mg 1 tablet by mouth BID M-F.  She denies missing any doses of Plaquenil recently.  She denies any recent signs or symptoms of a flare.  She denies any recent rashes or photosensitivity.  She denies any sores in her mouth or nose.  She has not had any symptoms of Raynaud's recently but continues to wear gloves on a regular basis.  She continues to have sicca symptoms.  Her symptoms have been manageable.  She is getting eyedrops twice daily.  She continues to have chronic pain in bilateral knee joints and is planning on having Visco gel injections within the next few weeks.  She denies any joint swelling.  She has left trochanteric bursitis and went to physical therapy 1 month ago which improved her symptoms.  She denies any fatigue, fevers, or swollen lymph nodes.  She denies any recent infections.  She denies any palpitations and shortness of breath.  She was evaluated and treated for an iIngrowth toenail last Tuesday by her podiatrist. She states it is healing well and she will be following up next week with her podiatrist.    Activities of Daily Living:  Patient reports morning stiffness for5-10  minutes.   Patient Denies nocturnal pain.  Difficulty dressing/grooming: Denies Difficulty climbing stairs: Denies Difficulty getting out of chair: Reports Difficulty using hands for taps, buttons, cutlery, and/or writing: Denies  Review of Systems  Constitutional: Negative for fatigue.  HENT: Negative for mouth sores, mouth dryness and nose dryness.   Eyes: Negative for pain, visual disturbance  and dryness.  Respiratory: Negative for cough, hemoptysis, shortness of breath and difficulty breathing.   Cardiovascular: Negative for chest pain, palpitations, hypertension and swelling in legs/feet.  Gastrointestinal: Negative for blood in stool, constipation and diarrhea.  Endocrine: Negative for increased urination.  Genitourinary: Negative for painful urination.  Musculoskeletal: Positive for arthralgias and joint pain. Negative for joint swelling, myalgias, muscle weakness, morning stiffness, muscle tenderness and myalgias.  Skin: Negative for color change, pallor, rash, hair loss, nodules/bumps, skin tightness, ulcers and sensitivity to sunlight.  Allergic/Immunologic: Negative for susceptible to infections.  Neurological: Negative for dizziness, numbness, headaches and weakness.  Hematological: Negative for swollen glands.  Psychiatric/Behavioral: Negative for depressed mood and sleep disturbance. The patient is not nervous/anxious.     PMFS History:  Patient Active Problem List   Diagnosis Date Noted  . Encounter for counseling 09/09/2018  . Osteopenia of multiple sites 08/22/2017  . History of vitamin D deficiency 08/22/2017  . Autoimmune disease ([place]) 10/23/2016  . Sjogren's syndrome ([place]) 10/09/2016  . Primary osteoarthritis of both hands 10/09/2016  . Primary osteoarthritis of both knees 10/09/2016  . High risk medication use 09/12/2016  . SBO (small bowel obstruction), partial postop 10/16/2011  . 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 Allergy   . Colon polyp   . Environmental allergies   .  Hypothyroid   . Osteoarthritis   . Osteoarthritis   . Osteoarthritis   . Osteopenia   . Raynauds phenomenon   . 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 Sjogren's syndrome ([place])   . Systemic lupus ([facility])     Family History  Problem Relation Age of Onset  .  Heart disease Mother   . Psoriasis Father   . Arthritis Father   . Heart disease Brother   . Heart attack Brother   . Diabetes Maternal Grandfather   . Cancer Maternal Uncle        colon  . Colon cancer Maternal Uncle 6  . Asthma Sister   . Breast cancer Neg Hx    Past Surgical History:  Procedure Laterality Date  . APPENDECTOMY    . BUNIONECTOMY  2008   right foot  . CATARACT EXTRACTION    . COLONOSCOPY W/ POLYPECTOMY  Aug 27, 2011  . ENDOMETRIAL ABLATION    . FOOT SURGERY Right 12/17/2017   bone spur  . ingrown toenail Right 12/16/2018  . LAPAROSCOPIC APPENDECTOMY  10/11/2011   Procedure: APPENDECTOMY LAPAROSCOPIC;  Surgeon: Adin Hector, MD;  Location: [facility];  Service: General;  Laterality: N/A;  Partial Cecectomy and Appendectomy  . WISDOM TOOTH EXTRACTION     Social History   Social History Narrative  . Not on file   Immunization History  Administered Date(s) Administered  . Influenza, High Dose Seasonal PF 07/05/2018  . Influenza,inj,quad, With Preservative 07/10/2017     Objective: Vital Signs: BP 122/76 (BP Location: Left Arm, Patient Position: Sitting, Cuff Size: Normal)   Pulse 73   Resp 13   Ht 5' 6.5" (1.689 m)   Wt 154 lb 3.2 oz (69.9 kg)   BMI 24.52 kg/m    Physical Exam Vitals signs and nursing note reviewed.  Constitutional:      Appearance: She is well-developed.  HENT:     Head: Normocephalic and atraumatic.     Comments: No parotid swelling or tenderness.     Nose:     Comments: No nasal ulcerations     Mouth/Throat:     Comments: No oral ulcerations  Eyes:     Conjunctiva/sclera: Conjunctivae normal.  Neck:     Musculoskeletal: Normal range of motion.  Cardiovascular:     Rate and Rhythm: Normal rate and regular rhythm.     Heart sounds: Normal heart sounds.  Pulmonary:     Effort: Pulmonary effort is normal.     Breath sounds: Normal breath sounds.  Abdominal:     General: Bowel sounds are normal.     Palpations: Abdomen  is soft.  Lymphadenopathy:     Cervical: No cervical adenopathy.  Skin:    General: Skin is warm and dry.     Capillary Refill: Capillary refill takes less than 2 seconds.     Comments: No malar rash.  No digital ulcerations or signs of gangrene.   Neurological:     Mental Status: She is alert and oriented to person, place, and time.  Psychiatric:        Behavior: Behavior normal.      Musculoskeletal Exam: C-spine, thoracic spine, lumbar spine good range of motion.  No midline spinal tenderness.  No SI joint tenderness.  Shoulder joints, elbow joints, wrist joints, MCPs, PIPs, DIPs good range of motion with no synovitis.  She has complete fist formation bilaterally.  Hip joints, knee joints, ankle joints, MTPs, PIPs, DIPs good range of motion  no synovitis.  No warmth or effusion bilateral knee joints.  No tender swelling of ankle joints.  No tenderness over trochanteric bursa bilaterally.  CDAI Exam: CDAI Score: Not documented Patient Global Assessment: Not documented; Provider Global Assessment: Not documented Swollen: Not documented; Tender: Not documented Joint Exam   Not documented   There is currently no information documented on the homunculus. Go to the Rheumatology activity and complete the homunculus joint exam.  Investigation: No additional findings.  Imaging: No results found.  Recent Labs: Lab Results  Component Value Date   WBC 5.8 10/28/2018   HGB 14.4 10/28/2018   PLT 279 10/28/2018   NA 142 10/28/2018   K 3.8 10/28/2018   CL 108 10/28/2018   CO2 25 10/28/2018   GLUCOSE 91 10/28/2018   BUN 14 10/28/2018   CREATININE 0.72 10/28/2018   BILITOT 0.5 10/28/2018   ALKPHOS 48 02/11/2017   AST 21 10/28/2018   ALT 13 10/28/2018   PROT 7.0 10/28/2018   ALBUMIN 3.9 02/11/2017   CALCIUM 9.2 10/28/2018   GFRAA 97 10/28/2018    Speciality Comments: PLQ eye exam: 09/08/18  Normal. Dr. Rutherford Guys. Follow up in 1 year.  Procedures:  No procedures  performed Allergies: Sulfonamide derivatives   Assessment / Plan:     Visit Diagnoses: Autoimmune disease ([place]) - positive ANA and Raynaud's phenomena and arthritis: She has not had any signs or symptoms of a flare.  She is clinically doing well on Plaquenil 200 mg 1 tablet twice daily Monday through Friday.  No Malar rash noted on exam.  She has not had any rashes or photosensitivity.  She has not had any symptoms of Raynaud's and no digital ulcerations or signs of gangrene were noted.  No oral or nasal ulcerations on exam.  She continues have chronic sicca symptoms.  She uses Systane eyedrops twice daily for eye dryness.  She has no parotid swelling or tenderness on exam.  She has not had any fatigue, low-grade fevers, or cervical lymphadenopathy.  She denies any palpitations or shortness of breath.  Lungs were clear to auscultation.  We will check autoimmune lab work today.  She will continue on Plaquenil 200 mg 1 tablet twice daily Monday through Friday.  A refill of Plaquenil sent to the pharmacy.  She was advised to notify us if she develops any new or worsening symptoms.  She will follow-up in the office in 5 months.- Plan: CBC with Differential/Platelet, COMPLETE METABOLIC PANEL WITH GFR, Urinalysis, Routine w reflex microscopic, Anti-DNA antibody, double-stranded, C3 and C4, Sedimentation rate, hydroxychloroquine (PLAQUENIL) 200 MG tablet, ANA  High risk medication use - Plaquenil 200 mg 1 tablet by mouth twice daily Monday through Friday.  CBC and CMP will be drawn today to monitor for drug toxicity.- Plan: CBC with Differential/Platelet, COMPLETE METABOLIC PANEL WITH GFR  Sjogren's syndrome with keratoconjunctivitis sicca ([place]): She continues to have chronic sicca symptoms.  Her symptoms of dry mouth have been manageable.  She has no parotid swelling or tenderness.  She uses Systane eyedrops twice daily.  She will continue on Plaquenil 200 mg 1 tablet twice daily Monday through Friday.  CBC, CMP,  and UA were checked today.  Primary osteoarthritis of both hands: She has no discomfort in her hands at this time.  She is complete fist formation.  She has no synovitis or tenderness.  Joint protection and muscle strengthening were discussed.  Primary osteoarthritis of both knees: No warmth or effusion.  She has good range of  motion.  She has occasional discomfort in bilateral knee joints.  She is scheduled for Visco injections on 12/30/2018, 01/06/2019, and 01/13/2019.  Lateral epicondylitis, left elbow: Resolved   Osteopenia of multiple sites: She takes a calcium and vitamin D supplement.  History of vitamin D deficiency: She is taking a vitamin D supplement daily.  Other fatigue: She has no fatigue at this time.   History of depression: She is taking Lexapro 10 mg by mouth daily.  A refill sent to the pharmacy.  History of anxiety: She is taking Lexapro 10 mg by mouth daily.    Orders: Orders Placed This Encounter  Procedures  . CBC with Differential/Platelet  . COMPLETE METABOLIC PANEL WITH GFR  . Urinalysis, Routine w reflex microscopic  . Anti-DNA antibody, double-stranded  . C3 and C4  . Sedimentation rate  . ANA   Meds ordered this encounter  Medications  . escitalopram (LEXAPRO) 10 MG tablet    Sig: Take 1 tablet (10 mg total) by mouth daily.    Dispense:  90 tablet    Refill:  0  . hydroxychloroquine (PLAQUENIL) 200 MG tablet    Sig: TAKE 1 TABLET TWICE A DAY  MONDAY THROUGH FRIDAY    Dispense:  120 tablet    Refill:  0     Follow-Up Instructions: Return in about 5 months (around 05/26/2019) for Autoimmune Disease.   Ofilia Neas, PA-C  Note - This record has been created using Dragon software.  Chart creation errors have been sought, but may not always  have been located. Such creation errors do not reflect on  the standard of medical care.

## 2018-12-24 ENCOUNTER — Ambulatory Visit: Payer: Medicare Other | Admitting: Rheumatology

## 2018-12-24 ENCOUNTER — Encounter: Payer: Self-pay | Admitting: Physician Assistant

## 2018-12-24 ENCOUNTER — Ambulatory Visit (INDEPENDENT_AMBULATORY_CARE_PROVIDER_SITE_OTHER): Payer: Medicare Other | Admitting: Physician Assistant

## 2018-12-24 VITALS — BP 122/76 | HR 73 | Resp 13 | Ht 66.5 in | Wt 154.2 lb

## 2018-12-24 DIAGNOSIS — M19042 Primary osteoarthritis, left hand: Secondary | ICD-10-CM

## 2018-12-24 DIAGNOSIS — M359 Systemic involvement of connective tissue, unspecified: Secondary | ICD-10-CM

## 2018-12-24 DIAGNOSIS — Z79899 Other long term (current) drug therapy: Secondary | ICD-10-CM

## 2018-12-24 DIAGNOSIS — Z8639 Personal history of other endocrine, nutritional and metabolic disease: Secondary | ICD-10-CM

## 2018-12-24 DIAGNOSIS — Z8659 Personal history of other mental and behavioral disorders: Secondary | ICD-10-CM

## 2018-12-24 DIAGNOSIS — M8589 Other specified disorders of bone density and structure, multiple sites: Secondary | ICD-10-CM

## 2018-12-24 DIAGNOSIS — M170 Bilateral primary osteoarthritis of knee: Secondary | ICD-10-CM

## 2018-12-24 DIAGNOSIS — M3501 Sicca syndrome with keratoconjunctivitis: Secondary | ICD-10-CM | POA: Diagnosis not present

## 2018-12-24 DIAGNOSIS — M19041 Primary osteoarthritis, right hand: Secondary | ICD-10-CM | POA: Diagnosis not present

## 2018-12-24 MED ORDER — HYDROXYCHLOROQUINE SULFATE 200 MG PO TABS: ORAL_TABLET | ORAL | 0 refills | Status: DC

## 2018-12-24 MED ORDER — ESCITALOPRAM OXALATE 10 MG PO TABS: 10.0000 mg | ORAL_TABLET | Freq: Every day | ORAL | 0 refills | Status: DC

## 2018-12-25 NOTE — Progress Notes (Signed)
CBC and CMP WNL.  Complements WNL. Sed rate WNL. UA revealed 2+ leukocytes.  Please notify patient and forward to PCP for further evaluation.

## 2018-12-25 NOTE — Progress Notes (Signed)
DsDNA is 1.  No sign of a flare.

## 2018-12-26 DIAGNOSIS — R399 Unspecified symptoms and signs involving the genitourinary system: Secondary | ICD-10-CM | POA: Diagnosis not present

## 2018-12-26 LAB — C3 AND C4
C3 Complement: 122 mg/dL (ref 83–193)
C4 Complement: 25 mg/dL (ref 15–57)

## 2018-12-26 LAB — ANA: ANA: POSITIVE — AB

## 2018-12-26 LAB — COMPLETE METABOLIC PANEL WITH GFR
AG RATIO: 1.7 (calc) (ref 1.0–2.5)
ALT: 13 U/L (ref 6–29)
AST: 22 U/L (ref 10–35)
Albumin: 4.4 g/dL (ref 3.6–5.1)
Alkaline phosphatase (APISO): 51 U/L (ref 37–153)
BUN: 11 mg/dL (ref 7–25)
CO2: 30 mmol/L (ref 20–32)
Calcium: 9.6 mg/dL (ref 8.6–10.4)
Chloride: 103 mmol/L (ref 98–110)
Creat: 0.77 mg/dL (ref 0.60–0.93)
GFR, Est African American: 89 mL/min/{1.73_m2} (ref >=60)
GFR, Est Non African American: 77 mL/min/{1.73_m2} (ref >=60)
Globulin: 2.6 g/dL (calc) (ref 1.9–3.7)
Glucose, Bld: 91 mg/dL (ref 65–99)
Potassium: 3.8 mmol/L (ref 3.5–5.3)
Sodium: 140 mmol/L (ref 135–146)
Total Bilirubin: 0.5 mg/dL (ref 0.2–1.2)
Total Protein: 7 g/dL (ref 6.1–8.1)

## 2018-12-26 LAB — CBC WITH DIFFERENTIAL/PLATELET
Absolute Monocytes: 561 cells/uL (ref 200–950)
BASOS PCT: 0.8 %
Basophils Absolute: 63 cells/uL (ref 0–200)
Eosinophils Absolute: 111 cells/uL (ref 15–500)
Eosinophils Relative: 1.4 %
HCT: 43.3 % (ref 35.0–45.0)
Hemoglobin: 14.4 g/dL (ref 11.7–15.5)
Lymphs Abs: 948 cells/uL (ref 850–3900)
MCH: 32.3 pg (ref 27.0–33.0)
MCHC: 33.3 g/dL (ref 32.0–36.0)
MCV: 97.1 fL (ref 80.0–100.0)
MPV: 10.8 fL (ref 7.5–12.5)
Monocytes Relative: 7.1 %
Neutro Abs: 6217 cells/uL (ref 1500–7800)
Neutrophils Relative %: 78.7 %
Platelets: 294 10*3/uL (ref 140–400)
RBC: 4.46 10*6/uL (ref 3.80–5.10)
RDW: 11.5 % (ref 11.0–15.0)
Total Lymphocyte: 12 %
WBC: 7.9 10*3/uL (ref 3.8–10.8)

## 2018-12-26 LAB — URINALYSIS, ROUTINE W REFLEX MICROSCOPIC
Bacteria, UA: NONE SEEN /HPF
Bilirubin Urine: NEGATIVE
Glucose, UA: NEGATIVE
HYALINE CAST: NONE SEEN /LPF
Hgb urine dipstick: NEGATIVE
Ketones, ur: NEGATIVE
Nitrite: NEGATIVE
Protein, ur: NEGATIVE
RBC / HPF: NONE SEEN /HPF (ref 0–2)
Specific Gravity, Urine: 1.006 (ref 1.001–1.03)
Squamous Epithelial / HPF: NONE SEEN /HPF (ref <=5)
pH: 6.5 (ref 5.0–8.0)

## 2018-12-26 LAB — SEDIMENTATION RATE: SED RATE: 9 mm/h (ref 0–30)

## 2018-12-26 LAB — ANTI-NUCLEAR AB-TITER (ANA TITER): ANA Titer 1: 1:80 {titer} — ABNORMAL HIGH

## 2018-12-26 LAB — ANTI-DNA ANTIBODY, DOUBLE-STRANDED: ds DNA Ab: 1 IU/mL

## 2018-12-29 ENCOUNTER — Encounter: Payer: Self-pay | Admitting: Rheumatology

## 2018-12-29 NOTE — Progress Notes (Signed)
ANA 1:80 Nuclear, speckled.

## 2018-12-30 ENCOUNTER — Ambulatory Visit: Payer: Medicare Other | Admitting: Physician Assistant

## 2018-12-30 DIAGNOSIS — L03031 Cellulitis of right toe: Secondary | ICD-10-CM | POA: Diagnosis not present

## 2019-01-06 ENCOUNTER — Ambulatory Visit: Payer: Medicare Other | Admitting: Physician Assistant

## 2019-01-13 ENCOUNTER — Ambulatory Visit: Payer: Medicare Other | Admitting: Physician Assistant

## 2019-01-15 ENCOUNTER — Other Ambulatory Visit: Payer: Self-pay | Admitting: Physician Assistant

## 2019-01-15 ENCOUNTER — Other Ambulatory Visit: Payer: Self-pay | Admitting: Rheumatology

## 2019-01-15 DIAGNOSIS — M359 Systemic involvement of connective tissue, unspecified: Secondary | ICD-10-CM

## 2019-01-21 ENCOUNTER — Encounter: Payer: Self-pay | Admitting: Rheumatology

## 2019-01-21 DIAGNOSIS — M359 Systemic involvement of connective tissue, unspecified: Secondary | ICD-10-CM

## 2019-01-21 MED ORDER — ESCITALOPRAM OXALATE 10 MG PO TABS: 10.0000 mg | ORAL_TABLET | Freq: Every day | ORAL | 0 refills | Status: DC

## 2019-01-21 MED ORDER — HYDROXYCHLOROQUINE SULFATE 200 MG PO TABS: ORAL_TABLET | ORAL | 0 refills | Status: DC

## 2019-01-21 NOTE — Telephone Encounter (Signed)
Last Visit: 12/24/2018 Next Visit: 06/01/2019 Labs: 12/24/2018 CBC and CMP WNL Eye exam: 09/08/2018  Okay to refill per Dr. Estanislado Pandy.

## 2019-02-04 ENCOUNTER — Encounter: Payer: Self-pay | Admitting: Rheumatology

## 2019-02-16 DIAGNOSIS — H01002 Unspecified blepharitis right lower eyelid: Secondary | ICD-10-CM | POA: Diagnosis not present

## 2019-02-16 DIAGNOSIS — H00015 Hordeolum externum left lower eyelid: Secondary | ICD-10-CM | POA: Diagnosis not present

## 2019-05-06 ENCOUNTER — Other Ambulatory Visit: Payer: Self-pay | Admitting: Rheumatology

## 2019-05-06 DIAGNOSIS — M359 Systemic involvement of connective tissue, unspecified: Secondary | ICD-10-CM

## 2019-05-06 NOTE — Telephone Encounter (Signed)
Last Visit: 12/24/18 Next Visit: 06/01/19 Labs: 12/24/18 CBC and CMP WNL. Eye exam: 09/08/18  Normal.  Okay to refill per Dr. Estanislado Pandy

## 2019-05-18 ENCOUNTER — Encounter: Payer: Self-pay | Admitting: Rheumatology

## 2019-05-18 NOTE — Progress Notes (Deleted)
Office Visit Note  Patient: [name]             Date of Birth: 1945-12-20           MRN: [id number]             PCP: Cari Caraway, MD Referring: Cari Caraway, MD Visit Date: 06/01/2019 Occupation: @GUAROCC @  Subjective:  No chief complaint on file.   History of Present Illness: [name] is a 73 y.o. female ***   Activities of Daily Living:  Patient reports morning stiffness for *** {minute/hour:19697}.   Patient {ACTIONS;DENIES/REPORTS:21021675::"Denies"} nocturnal pain.  Difficulty dressing/grooming: {ACTIONS;DENIES/REPORTS:21021675::"Denies"} Difficulty climbing stairs: {ACTIONS;DENIES/REPORTS:21021675::"Denies"} Difficulty getting out of chair: {ACTIONS;DENIES/REPORTS:21021675::"Denies"} Difficulty using hands for taps, buttons, cutlery, and/or writing: {ACTIONS;DENIES/REPORTS:21021675::"Denies"}  No Rheumatology ROS completed.   PMFS History:  Patient Active Problem List   Diagnosis Date Noted  . Encounter for counseling 09/09/2018  . Osteopenia of multiple sites 08/22/2017  . History of vitamin D deficiency 08/22/2017  . Autoimmune disease ([place]) 10/23/2016  . Sjogren's syndrome ([place]) 10/09/2016  . Primary osteoarthritis of both hands 10/09/2016  . Primary osteoarthritis of both knees 10/09/2016  . High risk medication use 09/12/2016  . SBO (small bowel obstruction), partial postop 10/16/2011  . 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 Allergy   . Colon polyp   . Environmental allergies   . Hypothyroid   . Osteoarthritis   . Osteoarthritis   . Osteoarthritis   . Osteopenia   . Raynauds phenomenon   . 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 Sjogren's syndrome ([place])   . Systemic lupus ([facility])     Family History  Problem Relation Age of Onset  . Heart disease Mother   . Psoriasis Father   . Arthritis Father   . Heart disease  Brother   . Heart attack Brother   . Diabetes Maternal Grandfather   . Cancer Maternal Uncle        colon  . Colon cancer Maternal Uncle 77  . Asthma Sister   . Breast cancer Neg Hx    Past Surgical History:  Procedure Laterality Date  . APPENDECTOMY    . BUNIONECTOMY  2008   right foot  . CATARACT EXTRACTION    . COLONOSCOPY W/ POLYPECTOMY  Aug 27, 2011  . ENDOMETRIAL ABLATION    . FOOT SURGERY Right 12/17/2017   bone spur  . ingrown toenail Right 12/16/2018  . LAPAROSCOPIC APPENDECTOMY  10/11/2011   Procedure: APPENDECTOMY LAPAROSCOPIC;  Surgeon: Adin Hector, MD;  Location: [facility];  Service: General;  Laterality: N/A;  Partial Cecectomy and Appendectomy  . WISDOM TOOTH EXTRACTION     Social History   Social History Narrative  . Not on file   Immunization History  Administered Date(s) Administered  . Influenza, High Dose Seasonal PF 07/05/2018  . Influenza,inj,quad, With Preservative 07/10/2017     Objective: Vital Signs: There were no vitals taken for this visit.   Physical Exam   Musculoskeletal Exam: ***  CDAI Exam: CDAI Score: - Patient Global: -; Provider Global: - Swollen: -; Tender: - Joint Exam   No joint exam has been documented for this visit   There is currently no information documented on the homunculus. Go to the Rheumatology activity and complete the homunculus joint exam.  Investigation: No additional findings.  Imaging: No results found.  Recent Labs: Lab Results  Component Value Date   WBC 7.9 12/24/2018   HGB 14.4 12/24/2018   PLT 294 12/24/2018   NA 140 12/24/2018   K 3.8 12/24/2018   CL 103 12/24/2018   CO2 30 12/24/2018   GLUCOSE 91 12/24/2018   BUN 11 12/24/2018   CREATININE 0.77 12/24/2018   BILITOT 0.5 12/24/2018   ALKPHOS 48 02/11/2017   AST 22 12/24/2018   ALT 13 12/24/2018   PROT 7.0 12/24/2018   ALBUMIN 3.9 02/11/2017   CALCIUM 9.6 12/24/2018   GFRAA 89 12/24/2018    Speciality Comments: PLQ eye exam:  09/08/18  Normal. Dr. Rutherford Guys. Follow up in 1 year.  Procedures:  No procedures performed Allergies: Sulfonamide derivatives   Assessment / Plan:     Visit Diagnoses: No diagnosis found.  Orders: No orders of the defined types were placed in this encounter.  No orders of the defined types were placed in this encounter.   Face-to-face time spent with patient was *** minutes. Greater than 50% of time was spent in counseling and coordination of care.  Follow-Up Instructions: No follow-ups on file.   Christine Neas, PA-C  Note - This record has been created using Dragon software.  Chart creation errors have been sought, but may not always  have been located. Such creation errors do not reflect on  the standard of medical care.

## 2019-05-19 NOTE — Progress Notes (Signed)
Virtual Visit via Video Note  I connected with [name] on 05/19/19 at 12:40 PM EDT by a video enabled telemedicine application and verified that I am speaking with the correct person using two identifiers.  Location: Patient: Home Provider: Clinic  This service was conducted via virtual visit.  Both audio and visual tools were used.  The patient was located at home. I was located in my office.  Consent was obtained prior to the virtual visit and is aware of possible charges through their insurance for this visit.  The patient is an established patient.  Dr. Estanislado Pandy, MD conducted the virtual visit and Hazel Sams, PA-C acted as scribe during the service.  Office staff helped with scheduling follow up visits after the service was conducted.   I discussed the limitations of evaluation and management by telemedicine and the availability of in person appointments. The patient expressed understanding and agreed to proceed.  PP:IRJJO plantar fasciitis  History of Present Illness: Patient is a 73 year old female with a past medical history of autoimmune disease, Sjogren's and osteoarthritis.  She is taking Plaquenil 200 mg 1 tablet po BID M-F. She denies any recent flares.  She is having symptoms of right plantar fasciitis.  She has been performing stretching exercises.  She denies any other joint pain or joint swelling. She denies any symptoms of Raynaud's. She has chronic sicca symptoms.  She denies any fatigue, fevers, or swollen lymph nodes.   Review of Systems  Constitutional: Negative for fever and malaise/fatigue.  Eyes: Negative for photophobia, pain, discharge and redness.  Respiratory: Negative for cough, shortness of breath and wheezing.   Cardiovascular: Negative for chest pain and palpitations.  Gastrointestinal: Negative for blood in stool, constipation and diarrhea.  Genitourinary: Negative for dysuria.  Musculoskeletal: Negative for back pain, joint pain, myalgias and neck  pain.  Skin: Negative for rash.  Neurological: Negative for dizziness and headaches.  Psychiatric/Behavioral: Negative for depression. The patient is not nervous/anxious and does not have insomnia.       Observations/Objective: Physical Exam  Constitutional: She is oriented to person, place, and time and well-developed, well-nourished, and in no distress.  HENT:  Head: Normocephalic and atraumatic.  Eyes: Conjunctivae are normal.  Pulmonary/Chest: Effort normal.  Neurological: She is alert and oriented to person, place, and time.  Psychiatric: Mood, memory, affect and judgment normal.  Patient reports morning stiffness for 0 minutes.   Patient denies nocturnal pain.  Difficulty dressing/grooming: Denies Difficulty climbing stairs: Denies Difficulty getting out of chair: Denies Difficulty using hands for taps, buttons, cutlery, and/or writing: Denies  BP: 114/68, pulse 62, temp 97.5, weight 153 lbs  Assessment and Plan: Visit Diagnoses: Autoimmune disease ([place]) - positive ANA and Raynaud's phenomena and arthritis: She has not had any recent signs or symptoms of a flare.  She is clinically doing well on Plaquenil 200 mg 1 tablet by mouth twice daily M-F.  She has no had any joint pain or joint swelling recently. She has chronic sicca symptoms but they have been manageable.  She uses systane eye drops BID for symptomatic relief. She has not had any recent rashes, Raynaud's, sores in mouth or nose, photosensitivity, fatigue, swollen lymph nodes, or fevers. She denies any palpitations or shortness of breath.  She will continue taking PLQ as prescribed.  She was advised to notify us if she develops any new or worsening symptoms. She will follow up in 5 months.   High risk medication use - Plaquenil 200  mg 1 tablet by mouth twice daily Monday through Friday.  CBC and CMP WNL on 05/26/19.  We will continue to monitor lab work in 5 months. PLQ eye exam: 09/08/18 Normal. Dr. Rutherford Guys. Follow up  in 1 year.  Sjogren's syndrome with keratoconjunctivitis sicca ([place]): She continues to have chronic sicca symptoms.  She uses Systane eyedrops twice daily.    Primary osteoarthritis of both hands: She has no hand pain or joint swelling at this time.  She has complete fist formation bilaterally.  Joint protection and muscle strengthening were discussed.   Primary osteoarthritis of both knees: She has no knee joint pain or joint swelling at this time.  She remains active.   Plantar fasciitis, right: She has been having intermittent symptoms of plantar fasciitis in the right foot.  She has been performing stretching exercises and icing which has helped.   Lateral epicondylitis, left elbow: Resolved   Osteopenia of multiple sites: She takes a calcium and vitamin D supplement.  History of vitamin D deficiency: She is taking a vitamin D supplement daily.  Other fatigue: She has no fatigue at this time.   History of depression: She is taking Lexapro 10 mg by mouth daily.  A refill sent to the pharmacy.  History of anxiety: She is taking Lexapro 10 mg by mouth daily.  Follow Up Instructions: She will follow up in 5 months.    I discussed the assessment and treatment plan with the patient. The patient was provided an opportunity to ask questions and all were answered. The patient agreed with the plan and demonstrated an understanding of the instructions.   The patient was advised to call back or seek an in-person evaluation if the symptoms worsen or if the condition fails to improve as anticipated.  I provided 15 minutes of non-face-to-face time during this encounter. Bo Merino, MD   Scribed by-  Ofilia Neas, PA-C

## 2019-05-20 DIAGNOSIS — L820 Inflamed seborrheic keratosis: Secondary | ICD-10-CM | POA: Diagnosis not present

## 2019-05-20 DIAGNOSIS — L570 Actinic keratosis: Secondary | ICD-10-CM | POA: Diagnosis not present

## 2019-05-21 ENCOUNTER — Encounter: Payer: Self-pay | Admitting: Rheumatology

## 2019-05-21 DIAGNOSIS — Z79899 Other long term (current) drug therapy: Secondary | ICD-10-CM

## 2019-05-21 DIAGNOSIS — M8589 Other specified disorders of bone density and structure, multiple sites: Secondary | ICD-10-CM

## 2019-05-21 DIAGNOSIS — E039 Hypothyroidism, unspecified: Secondary | ICD-10-CM

## 2019-05-21 DIAGNOSIS — M85851 Other specified disorders of bone density and structure, right thigh: Secondary | ICD-10-CM

## 2019-05-26 ENCOUNTER — Other Ambulatory Visit: Payer: Self-pay | Admitting: *Deleted

## 2019-05-26 DIAGNOSIS — Z79899 Other long term (current) drug therapy: Secondary | ICD-10-CM | POA: Diagnosis not present

## 2019-05-26 DIAGNOSIS — M85851 Other specified disorders of bone density and structure, right thigh: Secondary | ICD-10-CM

## 2019-05-26 DIAGNOSIS — M8589 Other specified disorders of bone density and structure, multiple sites: Secondary | ICD-10-CM

## 2019-05-26 DIAGNOSIS — E039 Hypothyroidism, unspecified: Secondary | ICD-10-CM | POA: Diagnosis not present

## 2019-05-27 ENCOUNTER — Other Ambulatory Visit: Payer: Self-pay | Admitting: Family Medicine

## 2019-05-27 DIAGNOSIS — Z1231 Encounter for screening mammogram for malignant neoplasm of breast: Secondary | ICD-10-CM

## 2019-05-27 LAB — COMPLETE METABOLIC PANEL WITH GFR
AG Ratio: 1.4 (calc) (ref 1.0–2.5)
ALT: 12 U/L (ref 6–29)
AST: 20 U/L (ref 10–35)
Albumin: 4.3 g/dL (ref 3.6–5.1)
Alkaline phosphatase (APISO): 49 U/L (ref 37–153)
BUN: 11 mg/dL (ref 7–25)
CO2: 30 mmol/L (ref 20–32)
Calcium: 9.6 mg/dL (ref 8.6–10.4)
Chloride: 103 mmol/L (ref 98–110)
Creat: 0.84 mg/dL (ref 0.60–0.93)
GFR, Est African American: 80 mL/min/{1.73_m2} (ref >=60)
GFR, Est Non African American: 69 mL/min/{1.73_m2} (ref >=60)
Globulin: 3 g/dL (calc) (ref 1.9–3.7)
Glucose, Bld: 91 mg/dL (ref 65–99)
Potassium: 4.3 mmol/L (ref 3.5–5.3)
Sodium: 138 mmol/L (ref 135–146)
Total Bilirubin: 0.5 mg/dL (ref 0.2–1.2)
Total Protein: 7.3 g/dL (ref 6.1–8.1)

## 2019-05-27 LAB — CBC WITH DIFFERENTIAL/PLATELET
Absolute Monocytes: 512 cells/uL (ref 200–950)
Basophils Absolute: 52 cells/uL (ref 0–200)
Basophils Relative: 1.1 %
Eosinophils Absolute: 160 cells/uL (ref 15–500)
Eosinophils Relative: 3.4 %
HCT: 45.1 % — ABNORMAL HIGH (ref 35.0–45.0)
Hemoglobin: 15.1 g/dL (ref 11.7–15.5)
Lymphs Abs: 1020 cells/uL (ref 850–3900)
MCH: 32.5 pg (ref 27.0–33.0)
MCHC: 33.5 g/dL (ref 32.0–36.0)
MCV: 97.2 fL (ref 80.0–100.0)
MPV: 10.9 fL (ref 7.5–12.5)
Monocytes Relative: 10.9 %
Neutro Abs: 2956 cells/uL (ref 1500–7800)
Neutrophils Relative %: 62.9 %
Platelets: 256 10*3/uL (ref 140–400)
RBC: 4.64 10*6/uL (ref 3.80–5.10)
RDW: 11.8 % (ref 11.0–15.0)
Total Lymphocyte: 21.7 %
WBC: 4.7 10*3/uL (ref 3.8–10.8)

## 2019-05-27 LAB — TSH: TSH: 2.69 m[IU]/L (ref 0.40–4.50)

## 2019-05-27 LAB — VITAMIN D 25 HYDROXY (VIT D DEFICIENCY, FRACTURES): Vit D, 25-Hydroxy: 51 ng/mL (ref 30–100)

## 2019-05-27 NOTE — Progress Notes (Signed)
WNLs

## 2019-05-29 ENCOUNTER — Telehealth (INDEPENDENT_AMBULATORY_CARE_PROVIDER_SITE_OTHER): Payer: Medicare Other | Admitting: Rheumatology

## 2019-05-29 ENCOUNTER — Encounter: Payer: Self-pay | Admitting: Rheumatology

## 2019-05-29 DIAGNOSIS — M8589 Other specified disorders of bone density and structure, multiple sites: Secondary | ICD-10-CM | POA: Diagnosis not present

## 2019-05-29 DIAGNOSIS — Z79899 Other long term (current) drug therapy: Secondary | ICD-10-CM | POA: Diagnosis not present

## 2019-05-29 DIAGNOSIS — M722 Plantar fascial fibromatosis: Secondary | ICD-10-CM

## 2019-05-29 DIAGNOSIS — R5383 Other fatigue: Secondary | ICD-10-CM

## 2019-05-29 DIAGNOSIS — M170 Bilateral primary osteoarthritis of knee: Secondary | ICD-10-CM

## 2019-05-29 DIAGNOSIS — M3501 Sicca syndrome with keratoconjunctivitis: Secondary | ICD-10-CM | POA: Diagnosis not present

## 2019-05-29 DIAGNOSIS — M19042 Primary osteoarthritis, left hand: Secondary | ICD-10-CM | POA: Diagnosis not present

## 2019-05-29 DIAGNOSIS — Z8659 Personal history of other mental and behavioral disorders: Secondary | ICD-10-CM

## 2019-05-29 DIAGNOSIS — M359 Systemic involvement of connective tissue, unspecified: Secondary | ICD-10-CM | POA: Diagnosis not present

## 2019-05-29 DIAGNOSIS — Z8639 Personal history of other endocrine, nutritional and metabolic disease: Secondary | ICD-10-CM | POA: Diagnosis not present

## 2019-05-29 DIAGNOSIS — M19041 Primary osteoarthritis, right hand: Secondary | ICD-10-CM | POA: Diagnosis not present

## 2019-06-01 ENCOUNTER — Ambulatory Visit: Payer: Self-pay | Admitting: Physician Assistant

## 2019-06-04 DIAGNOSIS — M3500 Sicca syndrome, unspecified: Secondary | ICD-10-CM | POA: Diagnosis not present

## 2019-06-04 DIAGNOSIS — J309 Allergic rhinitis, unspecified: Secondary | ICD-10-CM | POA: Diagnosis not present

## 2019-06-04 DIAGNOSIS — E559 Vitamin D deficiency, unspecified: Secondary | ICD-10-CM | POA: Diagnosis not present

## 2019-06-04 DIAGNOSIS — M85859 Other specified disorders of bone density and structure, unspecified thigh: Secondary | ICD-10-CM | POA: Diagnosis not present

## 2019-06-04 DIAGNOSIS — F3342 Major depressive disorder, recurrent, in full remission: Secondary | ICD-10-CM | POA: Diagnosis not present

## 2019-06-04 DIAGNOSIS — N952 Postmenopausal atrophic vaginitis: Secondary | ICD-10-CM | POA: Diagnosis not present

## 2019-06-04 DIAGNOSIS — Z0000 Encounter for general adult medical examination without abnormal findings: Secondary | ICD-10-CM | POA: Diagnosis not present

## 2019-06-04 DIAGNOSIS — Z87440 Personal history of urinary (tract) infections: Secondary | ICD-10-CM | POA: Diagnosis not present

## 2019-06-04 DIAGNOSIS — M329 Systemic lupus erythematosus, unspecified: Secondary | ICD-10-CM | POA: Diagnosis not present

## 2019-06-04 DIAGNOSIS — E039 Hypothyroidism, unspecified: Secondary | ICD-10-CM | POA: Diagnosis not present

## 2019-06-08 DIAGNOSIS — N898 Other specified noninflammatory disorders of vagina: Secondary | ICD-10-CM | POA: Diagnosis not present

## 2019-06-08 DIAGNOSIS — N949 Unspecified condition associated with female genital organs and menstrual cycle: Secondary | ICD-10-CM | POA: Diagnosis not present

## 2019-06-08 DIAGNOSIS — R350 Frequency of micturition: Secondary | ICD-10-CM | POA: Diagnosis not present

## 2019-06-10 ENCOUNTER — Other Ambulatory Visit: Payer: Self-pay | Admitting: Family Medicine

## 2019-06-10 DIAGNOSIS — M8580 Other specified disorders of bone density and structure, unspecified site: Secondary | ICD-10-CM

## 2019-06-18 ENCOUNTER — Ambulatory Visit
Admission: RE | Admit: 2019-06-18 | Discharge: 2019-06-18 | Disposition: A | Discharge status: Home / Self Care | Payer: Medicare Other | Source: Ambulatory Visit | Attending: Family Medicine | Admitting: Family Medicine

## 2019-06-18 ENCOUNTER — Other Ambulatory Visit: Payer: Self-pay

## 2019-06-18 DIAGNOSIS — Z1231 Encounter for screening mammogram for malignant neoplasm of breast: Secondary | ICD-10-CM

## 2019-06-19 ENCOUNTER — Encounter: Payer: Self-pay | Admitting: Rheumatology

## 2019-06-19 DIAGNOSIS — Z23 Encounter for immunization: Secondary | ICD-10-CM | POA: Diagnosis not present

## 2019-07-18 ENCOUNTER — Other Ambulatory Visit: Payer: Self-pay | Admitting: Rheumatology

## 2019-07-20 NOTE — Telephone Encounter (Signed)
Last Visit: 05/29/19 Next Visit: 11/06/19  Okay to refill per Dr. Estanislado Pandy

## 2019-08-07 ENCOUNTER — Other Ambulatory Visit: Payer: Self-pay | Admitting: Rheumatology

## 2019-08-07 DIAGNOSIS — M359 Systemic involvement of connective tissue, unspecified: Secondary | ICD-10-CM

## 2019-08-07 NOTE — Telephone Encounter (Signed)
Last Visit: 05/29/19 Next Visit: 11/06/19 Labs: 05/26/19 WNL PLQ eye exam: 09/08/18  Normal.  Okay to refill per Dr. Estanislado Pandy

## 2019-08-13 NOTE — Telephone Encounter (Signed)
Authorization from February 2020 is still valid.   Please schedule patient an appointment with Dr. Estanislado Pandy or Lovena Le for gel injection.  Thank You.  Approved for Euflexxa series, bilateral knee. Buy & Bill Covered at 100% through her insurance. No Co-pay No PA required  Appt.needs to be after 12/24/2018.

## 2019-08-17 ENCOUNTER — Other Ambulatory Visit: Payer: Self-pay

## 2019-08-17 ENCOUNTER — Ambulatory Visit (INDEPENDENT_AMBULATORY_CARE_PROVIDER_SITE_OTHER): Payer: Medicare Other | Admitting: Physician Assistant

## 2019-08-17 DIAGNOSIS — M170 Bilateral primary osteoarthritis of knee: Secondary | ICD-10-CM | POA: Diagnosis not present

## 2019-08-17 MED ORDER — LIDOCAINE HCL 1 % IJ SOLN
1.5000 mL | INTRAMUSCULAR | Status: AC | PRN
  Administered 2019-08-17: 1.5 mL

## 2019-08-17 MED ORDER — SODIUM HYALURONATE (VISCOSUP) 20 MG/2ML IX SOSY
20.0000 mg | PREFILLED_SYRINGE | INTRA_ARTICULAR | Status: AC | PRN
  Administered 2019-08-17: 20 mg via INTRA_ARTICULAR

## 2019-08-17 NOTE — Progress Notes (Signed)
   Procedure Note  Patient: [name]             Date of Birth: July 25, 1946           MRN: [id number]             Visit Date: 08/17/2019  Procedures: Visit Diagnoses:  1. Primary osteoarthritis of both knees    Euflexxa #1 Bilateral knee joint injections   Large Joint Inj: bilateral knee on 08/17/2019 9:07 AM Indications: pain Details: 27 G 1.5 in needle, medial approach  Arthrogram: No  Medications (Right): 20 mg Sodium Hyaluronate 20 MG/2ML; 1.5 mL lidocaine 1 % Aspirate (Right): 0 mL Medications (Left): 20 mg Sodium Hyaluronate 20 MG/2ML; 1.5 mL lidocaine 1 % Aspirate (Left): 0 mL Outcome: tolerated well, no immediate complications Procedure, treatment alternatives, risks and benefits explained, specific risks discussed. Consent was given by the patient. Immediately prior to procedure a time out was called to verify the correct patient, procedure, equipment, support staff and site/side marked as required. Patient was prepped and draped in the usual sterile fashion.     Patient tolerated the procedure well. Hazel Sams, PA-C

## 2019-08-24 ENCOUNTER — Ambulatory Visit (INDEPENDENT_AMBULATORY_CARE_PROVIDER_SITE_OTHER): Payer: Medicare Other | Admitting: Physician Assistant

## 2019-08-24 ENCOUNTER — Other Ambulatory Visit: Payer: Self-pay

## 2019-08-24 DIAGNOSIS — M170 Bilateral primary osteoarthritis of knee: Secondary | ICD-10-CM

## 2019-08-24 MED ORDER — LIDOCAINE HCL 1 % IJ SOLN
1.5000 mL | INTRAMUSCULAR | Status: AC | PRN
  Administered 2019-08-24: 1.5 mL

## 2019-08-24 MED ORDER — SODIUM HYALURONATE (VISCOSUP) 20 MG/2ML IX SOSY
20.0000 mg | PREFILLED_SYRINGE | INTRA_ARTICULAR | Status: AC | PRN
  Administered 2019-08-24: 20 mg via INTRA_ARTICULAR

## 2019-08-24 NOTE — Progress Notes (Signed)
   Procedure Note  Patient: [name]             Date of Birth: 01-Oct-1946           MRN: [id number]             Visit Date: 08/24/2019  Procedures: Visit Diagnoses:  1. Primary osteoarthritis of both knees    Euflexxa #2 Bilateral knee joint injections   Large Joint Inj: bilateral knee on 08/24/2019 8:07 AM Indications: pain Details: 27 G 1.5 in needle, medial approach  Arthrogram: No  Medications (Right): 20 mg Sodium Hyaluronate 20 MG/2ML; 1.5 mL lidocaine 1 % Aspirate (Right): 0 mL Medications (Left): 20 mg Sodium Hyaluronate 20 MG/2ML; 1.5 mL lidocaine 1 % Aspirate (Left): 0 mL Outcome: tolerated well, no immediate complications Procedure, treatment alternatives, risks and benefits explained, specific risks discussed. Consent was given by the patient. Immediately prior to procedure a time out was called to verify the correct patient, procedure, equipment, support staff and site/side marked as required. Patient was prepped and draped in the usual sterile fashion.      Patient tolerated the procedure well.  Hazel Sams, PA-C

## 2019-08-31 ENCOUNTER — Ambulatory Visit (INDEPENDENT_AMBULATORY_CARE_PROVIDER_SITE_OTHER): Payer: Medicare Other | Admitting: Physician Assistant

## 2019-08-31 ENCOUNTER — Encounter: Payer: Self-pay | Admitting: Physician Assistant

## 2019-08-31 ENCOUNTER — Other Ambulatory Visit: Payer: Self-pay

## 2019-08-31 VITALS — BP 116/73 | HR 65 | Resp 11 | Ht 67.0 in | Wt 155.4 lb

## 2019-08-31 DIAGNOSIS — R5383 Other fatigue: Secondary | ICD-10-CM | POA: Diagnosis not present

## 2019-08-31 DIAGNOSIS — M7062 Trochanteric bursitis, left hip: Secondary | ICD-10-CM | POA: Diagnosis not present

## 2019-08-31 DIAGNOSIS — M19042 Primary osteoarthritis, left hand: Secondary | ICD-10-CM

## 2019-08-31 DIAGNOSIS — M359 Systemic involvement of connective tissue, unspecified: Secondary | ICD-10-CM

## 2019-08-31 DIAGNOSIS — Z79899 Other long term (current) drug therapy: Secondary | ICD-10-CM | POA: Diagnosis not present

## 2019-08-31 DIAGNOSIS — M170 Bilateral primary osteoarthritis of knee: Secondary | ICD-10-CM

## 2019-08-31 DIAGNOSIS — M19041 Primary osteoarthritis, right hand: Secondary | ICD-10-CM | POA: Diagnosis not present

## 2019-08-31 DIAGNOSIS — M7061 Trochanteric bursitis, right hip: Secondary | ICD-10-CM

## 2019-08-31 DIAGNOSIS — M3501 Sicca syndrome with keratoconjunctivitis: Secondary | ICD-10-CM

## 2019-08-31 DIAGNOSIS — M8589 Other specified disorders of bone density and structure, multiple sites: Secondary | ICD-10-CM | POA: Diagnosis not present

## 2019-08-31 DIAGNOSIS — Z8659 Personal history of other mental and behavioral disorders: Secondary | ICD-10-CM

## 2019-08-31 DIAGNOSIS — M722 Plantar fascial fibromatosis: Secondary | ICD-10-CM | POA: Diagnosis not present

## 2019-08-31 DIAGNOSIS — Z8639 Personal history of other endocrine, nutritional and metabolic disease: Secondary | ICD-10-CM

## 2019-08-31 MED ORDER — TRIAMCINOLONE ACETONIDE 40 MG/ML IJ SUSP
40.0000 mg | INTRAMUSCULAR | Status: AC | PRN
  Administered 2019-08-31: 40 mg via INTRA_ARTICULAR

## 2019-08-31 MED ORDER — LIDOCAINE HCL 1 % IJ SOLN
1.0000 mL | INTRAMUSCULAR | Status: AC | PRN
  Administered 2019-08-31: 1 mL

## 2019-08-31 MED ORDER — LIDOCAINE HCL 1 % IJ SOLN
1.5000 mL | INTRAMUSCULAR | Status: AC | PRN
  Administered 2019-08-31: 1.5 mL

## 2019-08-31 MED ORDER — SODIUM HYALURONATE (VISCOSUP) 20 MG/2ML IX SOSY
20.0000 mg | PREFILLED_SYRINGE | INTRA_ARTICULAR | Status: AC | PRN
  Administered 2019-08-31: 20 mg via INTRA_ARTICULAR

## 2019-08-31 NOTE — Progress Notes (Signed)
   Procedure Note  Patient: [name]             Date of Birth: Dec 27, 1945           MRN: [id number]             Visit Date: 08/31/2019  Procedures: Visit Diagnoses:  1. Primary osteoarthritis of both knees    Euflexxa #3 bilateral knee joint injections B/b  Large Joint Inj: bilateral knee on 08/31/2019 12:25 PM Indications: pain Details: 27 G 1.5 in needle, medial approach  Arthrogram: No  Medications (Right): 20 mg Sodium Hyaluronate 20 MG/2ML; 1.5 mL lidocaine 1 % Aspirate (Right): 0 mL Medications (Left): 20 mg Sodium Hyaluronate 20 MG/2ML; 1.5 mL lidocaine 1 % Aspirate (Left): 0 mL Outcome: tolerated well, no immediate complications Procedure, treatment alternatives, risks and benefits explained, specific risks discussed. Consent was given by the patient. Immediately prior to procedure a time out was called to verify the correct patient, procedure, equipment, support staff and site/side marked as required. Patient was prepped and draped in the usual sterile fashion.     Patient tolerated the procedure well.  Hazel Sams, PA-C

## 2019-08-31 NOTE — Patient Instructions (Signed)
Standing Labs We placed an order today for your standing lab work.    Please come back and get your standing labs in January and every 5 months   We have open lab daily Monday through Thursday from 8:30-12:30 PM and 1:30-4:30 PM and Friday from 8:30-12:30 PM and 1:30-4:00 PM at the office of Dr. Bo Merino.   You may experience shorter wait times on Monday and Friday afternoons. The office is located at [address] [place], [place], [place] [address] No appointment is necessary.   Labs are drawn by [facility].  You may receive a bill from [facility] for your lab work.  If you wish to have your labs drawn at another location, please call the office 24 hours in advance to send orders.  If you have any questions regarding directions or hours of operation,  please call [phone].   Just as a reminder please drink plenty of water prior to coming for your lab work. Thanks!

## 2019-08-31 NOTE — Progress Notes (Signed)
Office Visit Note  Patient: [name]             Date of Birth: [birth year]           MRN: [id number]             PCP: Cari Caraway, MD Referring: Cari Caraway, MD Visit Date: 08/31/2019 Occupation: @GUAROCC @  Subjective:  Trochanteric bursitis bilaterally   History of Present Illness: [name] is a 73 y.o. female with history of autoimmune disease and osteoarthritis.  She is taking Plaquenil 200 mg 1 tablet by mouth BID M-F.   She denies any signs or symptoms of an autoimmune disease flare.  She denies any recent rashes, hair loss, or photosensitivity.  She denies any sores in her mouth or nose.  She has chronic sicca symptoms which have been stable.  She has intermittent symptoms of Raynaud's but no ulcerations.  She denies any worsening fatigue, recent fevers, or enlarged lymph nodes. She presents today with trochanteric bursitis bilaterally.  She has been having increased discomfort over the past 1 week . She states the pain has been severe and worse when sitting.  She has been performing stretching exercises.  She states she has noticed less joint pain since undergoing visco gel injections, and she is ready for the 3rd injection of the series today.  She states her morning stiffness has subsided.    Activities of Daily Living:  Patient reports morning stiffness for  0  minutes.   Patient Reports nocturnal pain.  Difficulty dressing/grooming: Denies Difficulty climbing stairs: Denies Difficulty getting out of chair: Denies Difficulty using hands for taps, buttons, cutlery, and/or writing: Denies  Review of Systems  Constitutional: Negative for fatigue.  HENT: Negative for mouth sores, mouth dryness and nose dryness.   Eyes: Negative for pain, visual disturbance and dryness.  Respiratory: Negative for cough, hemoptysis, shortness of breath and difficulty breathing.   Cardiovascular: Negative for chest pain, palpitations, hypertension and swelling in legs/feet.   Gastrointestinal: Negative for blood in stool, constipation and diarrhea.  Endocrine: Negative for increased urination.  Genitourinary: Negative for painful urination.  Musculoskeletal: Positive for arthralgias and joint pain. Negative for joint swelling, myalgias, muscle weakness, morning stiffness, muscle tenderness and myalgias.  Skin: Negative for color change, pallor, rash, hair loss, nodules/bumps, skin tightness, ulcers and sensitivity to sunlight.  Allergic/Immunologic: Negative for susceptible to infections.  Neurological: Negative for dizziness, numbness, headaches and weakness.  Hematological: Negative for swollen glands.  Psychiatric/Behavioral: Negative for depressed mood and sleep disturbance. The patient is not nervous/anxious.     PMFS History:  Patient Active Problem List   Diagnosis Date Noted  . Encounter for counseling 09/09/2018  . Osteopenia of multiple sites 08/22/2017  . History of vitamin D deficiency 08/22/2017  . Autoimmune disease ([place]) 10/23/2016  . Sjogren's syndrome ([place]) 10/09/2016  . Primary osteoarthritis of both hands 10/09/2016  . Primary osteoarthritis of both knees 10/09/2016  . High risk medication use 09/12/2016  . SBO (small bowel obstruction), partial postop 10/16/2011  . 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 Allergy   . Colon polyp   . Environmental allergies   . Hypothyroid   . Osteoarthritis   . Osteoarthritis   . Osteoarthritis   . Osteopenia   . Raynauds phenomenon   . 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 Sjogren's syndrome ([place])   . Systemic  lupus ([place])     Family History  Problem Relation Age of Onset  . Heart disease Mother   . Psoriasis Father   . Arthritis Father   . Heart disease Brother   . Heart attack Brother   . Diabetes Maternal Grandfather   . Cancer Maternal Uncle        colon  . Colon  cancer Maternal Uncle 74  . Asthma Sister   . Breast cancer Neg Hx    Past Surgical History:  Procedure Laterality Date  . APPENDECTOMY    . BUNIONECTOMY  2008   right foot  . CATARACT EXTRACTION    . COLONOSCOPY W/ POLYPECTOMY  Aug 27, 2011  . ENDOMETRIAL ABLATION    . FOOT SURGERY Right 12/17/2017   bone spur  . ingrown toenail Right 12/16/2018  . LAPAROSCOPIC APPENDECTOMY  10/11/2011   Procedure: APPENDECTOMY LAPAROSCOPIC;  Surgeon: Adin Hector, MD;  Location: [facility];  Service: General;  Laterality: N/A;  Partial Cecectomy and Appendectomy  . WISDOM TOOTH EXTRACTION     Social History   Social History Narrative  . Not on file   Immunization History  Administered Date(s) Administered  . Influenza, High Dose Seasonal PF 07/05/2018  . Influenza,inj,quad, With Preservative 07/10/2017     Objective: Vital Signs: BP 116/73 (BP Location: Left Arm, Patient Position: Sitting, Cuff Size: Normal)   Pulse 65   Resp 11   Ht 5\' 7"  (1.702 m)   Wt 155 lb 6.4 oz (70.5 kg)   BMI 24.34 kg/m    Physical Exam Vitals signs and nursing note reviewed.  Constitutional:      Appearance: She is well-developed.  HENT:     Head: Normocephalic and atraumatic.  Eyes:     Conjunctiva/sclera: Conjunctivae normal.  Neck:     Musculoskeletal: Normal range of motion.  Cardiovascular:     Rate and Rhythm: Normal rate and regular rhythm.     Heart sounds: Normal heart sounds.  Pulmonary:     Effort: Pulmonary effort is normal.     Breath sounds: Normal breath sounds.  Abdominal:     General: Bowel sounds are normal.     Palpations: Abdomen is soft.  Lymphadenopathy:     Cervical: No cervical adenopathy.  Skin:    General: Skin is warm and dry.     Capillary Refill: Capillary refill takes less than 2 seconds.  Neurological:     Mental Status: She is alert and oriented to person, place, and time.  Psychiatric:        Behavior: Behavior normal.      Musculoskeletal Exam: C-spine,  thoracic spine, and lumbar spine good ROM.  No midline spinal tenderness.  No SI joint tenderness.  Shoulder joints, elbow joints, wrist joints, MCPs, PIPs, and DIPs good ROM with no synovitis.  Complete fist formation bilaterally. PIP and DIP synovial thickening.  CMC joint synovial thickening bilaterally.  Hip joints good ROM with no discomfort.  Tenderness over bilateral trochanteric bursa.  Knee joints good ROM with no warmth or effusion.  Bilateral knee crepitus.  Ankle joints good ROM with no tenderness or inflammation.   CDAI Exam: CDAI Score: - Patient Global: -; Provider Global: - Swollen: -; Tender: - Joint Exam   No joint exam has been documented for this visit   There is currently no information documented on the homunculus. Go to the Rheumatology activity and complete the homunculus joint exam.  Investigation: No additional findings.  Imaging: No results found.  Recent Labs: Lab Results  Component Value Date   WBC 4.7 05/26/2019   HGB 15.1 05/26/2019   PLT 256 05/26/2019   NA 138 05/26/2019   K 4.3 05/26/2019   CL 103 05/26/2019   CO2 30 05/26/2019   GLUCOSE 91 05/26/2019   BUN 11 05/26/2019   CREATININE 0.84 05/26/2019   BILITOT 0.5 05/26/2019   ALKPHOS 48 02/11/2017   AST 20 05/26/2019   ALT 12 05/26/2019   PROT 7.3 05/26/2019   ALBUMIN 3.9 02/11/2017   CALCIUM 9.6 05/26/2019   GFRAA 80 05/26/2019    Speciality Comments: PLQ eye exam: 09/08/18  Normal. Dr. Rutherford Guys. Follow up in 1 year.  Procedures:  Large Joint Inj: bilateral greater trochanter on 08/31/2019 3:01 PM Indications: pain Details: 27 G 1.5 in needle, lateral approach  Arthrogram: No  Medications (Right): 1 mL lidocaine 1 %; 40 mg triamcinolone acetonide 40 MG/ML Aspirate (Right): 0 mL Medications (Left): 1 mL lidocaine 1 %; 40 mg triamcinolone acetonide 40 MG/ML Aspirate (Left): 0 mL Outcome: tolerated well, no immediate complications Procedure, treatment alternatives, risks and  benefits explained, specific risks discussed. Consent was given by the patient. Immediately prior to procedure a time out was called to verify the correct patient, procedure, equipment, support staff and site/side marked as required. Patient was prepped and draped in the usual sterile fashion.     Allergies: Latex and Sulfonamide derivatives   Assessment / Plan:     Visit Diagnoses: Autoimmune disease ([place]) - ANA and Raynaud's phenomena and arthritis: She has not had any signs or symptoms of a flare recently.  She is clinically doing well on Plaquenil 200 mg 1 tablet by mouth twice daily M-F.  She has no synovitis on exam.  She has noticed improvement in bilateral knee joint discomfort since undergoing euflexxa injections (3rd injection of series performed today).  She has no other joint pain or joint swelling at this. She has not had any recent rashes, photosensitivity, or hair loss.  No malar rash noted.  She has intermittent symptoms of Raynaud's.  No digital ulcerations or signs of gangrene were noted.  We discussed the importance of keeping her core body temperature warm, wearing gloves/socks, and avoiding triggers.  She has not had any SOB, CP, or palpitations recently.  She denies any worsening fatigue, recent fevers, or enlarged lymph nodes.  She will continue taking PLQ as prescribed.  She does not need a refill.  She will be due to update lab work in January and every 5 months.  She was advised to notify us if she develops any new or worsening symptoms.  She will follow up in 5 months.   High risk medication use - Plaquenil 200 mg 1 tablet by mouth twice daily Monday through Friday.  CBC and CMP WNL on 05/26/19.  She will return for lab work in January and every 5 months to monitor for drug toxicity. PLQ eye exam: 09/08/18 Normal. Dr. Rutherford Guys. Follow up in 1 year.  Sjogren's syndrome with keratoconjunctivitis sicca ([place]): She has chronic sicca symptoms which have been stable. She is taking  Lovaza and using systane eye drops.   Primary osteoarthritis of both hands: She has PIP and DIP synovial thickening consistent with osteoarthritis of both hands.  She has CMC joint synovial thickening bilaterally.  No synovitis or tenderness noted.  She has complete fist formation bilaterally. Joint protection and muscle strengthening were discussed.   Primary osteoarthritis of both knees - She has good  ROM of both knee joints with no discomfort.  No warmth or effusion noted.  She presents today for the 3rd euflexxa injection of the series.  She tolerated the procedures well. Plan: Large Joint Inj: bilateral knee  Trochanteric bursitis of both hips: She presents today with worsening symptoms of trochanteric bursitis bilaterally.  She states she has been decorating for christmas at her church, and she thinks this has exacerbated her discomfort.  She has tenderness bilaterally.  She requested cortisone injections bilaterally. She tolerated the procedure well.  Procedure note completed above.  Aftercare discussed.  She was encouraged to perform stretching exercises starting next week.   Plantar fasciitis, right: Resolved   Osteopenia of multiple sites - DEXA ordered by Dr. Addison Lank and scheduled on 11/10/19.  She is taking vitmain D and Calcium. She was previously taking Boniva but held it this month and does not plan on taking it the month of December.  She has been have "bone pain" in bilateral tibias, and after discussing with her PCP she decided to hold Boniva to see in her symptoms are a side effect of Boniva.   History of vitamin D deficiency - Vitamin D was 51 on 05/26/19.   Other medical conditions are listed as follows;   History of depression  Other fatigue  History of anxiety  Orders: Orders Placed This Encounter  Procedures  . Large Joint Inj: bilateral knee  . Large Joint Inj   No orders of the defined types were placed in this encounter.   Face-to-face time spent with patient  was 30 minutes. Greater than 50% of time was spent in counseling and coordination of care.  Follow-Up Instructions: Return in about 5 months (around 01/29/2020) for Autoimmune Disease, Osteoarthritis.   Ofilia Neas, PA-C  Note - This record has been created using Dragon software.  Chart creation errors have been sought, but may not always  have been located. Such creation errors do not reflect on  the standard of medical care.

## 2019-09-07 ENCOUNTER — Encounter: Payer: Self-pay | Admitting: Rheumatology

## 2019-09-07 NOTE — Telephone Encounter (Signed)
I attempted to call the patient to discuss her mychart message.  We recommend a referral to PT at [facility] urology for bilateral trochanteric bursitis.  She can also try the OTC lidocaine patches that she was asking about.  Also flushing is a common side effect after cortisone injections.

## 2019-09-10 DIAGNOSIS — Z79899 Other long term (current) drug therapy: Secondary | ICD-10-CM | POA: Diagnosis not present

## 2019-09-10 DIAGNOSIS — M3210 Systemic lupus erythematosus, organ or system involvement unspecified: Secondary | ICD-10-CM | POA: Diagnosis not present

## 2019-09-10 DIAGNOSIS — Z961 Presence of intraocular lens: Secondary | ICD-10-CM | POA: Diagnosis not present

## 2019-09-15 DIAGNOSIS — Z20828 Contact with and (suspected) exposure to other viral communicable diseases: Secondary | ICD-10-CM | POA: Diagnosis not present

## 2019-10-19 ENCOUNTER — Encounter: Payer: Self-pay | Admitting: Rheumatology

## 2019-10-19 DIAGNOSIS — Z79899 Other long term (current) drug therapy: Secondary | ICD-10-CM

## 2019-10-19 DIAGNOSIS — M359 Systemic involvement of connective tissue, unspecified: Secondary | ICD-10-CM

## 2019-10-21 ENCOUNTER — Telehealth: Payer: Self-pay | Admitting: Rheumatology

## 2019-10-21 NOTE — Telephone Encounter (Signed)
Lab orders faxed to number provided

## 2019-10-21 NOTE — Telephone Encounter (Signed)
Sharee Pimple from Dr. Baldomero Lamy office at [place] [facility] left a voicemail stating they received a message from patient who told her Dr. Arlean Hopping office faxed her labwork orders.  Sharee Pimple states they have not received the orders and requested they be faxed to [phone].  If you have any questions, please call [phone]

## 2019-11-03 DIAGNOSIS — M359 Systemic involvement of connective tissue, unspecified: Secondary | ICD-10-CM

## 2019-11-03 NOTE — Progress Notes (Signed)
PLQ eye exam WNL on 09/10/19. Follow up in 1 year.

## 2019-11-04 MED ORDER — HYDROXYCHLOROQUINE SULFATE 200 MG PO TABS: ORAL_TABLET | ORAL | 0 refills | Status: DC

## 2019-11-04 MED ORDER — ESCITALOPRAM OXALATE 10 MG PO TABS: 10.0000 mg | ORAL_TABLET | Freq: Every day | ORAL | 0 refills | Status: DC

## 2019-11-04 NOTE — Telephone Encounter (Signed)
Last Visit: 05/29/19 Next Visit: 11/06/19 Labs: 05/26/19 WNL PLQ eye exam: 09/10/19  Normal.  Okay to refill per Dr. Estanislado Pandy

## 2019-11-06 ENCOUNTER — Ambulatory Visit: Payer: Medicare Other | Admitting: Physician Assistant

## 2019-11-10 ENCOUNTER — Other Ambulatory Visit: Payer: Medicare Other

## 2019-11-26 ENCOUNTER — Ambulatory Visit: Payer: Medicare Other

## 2019-12-04 ENCOUNTER — Encounter: Payer: Self-pay | Admitting: Rheumatology

## 2019-12-07 ENCOUNTER — Encounter: Payer: Self-pay | Admitting: Rheumatology

## 2019-12-07 DIAGNOSIS — R350 Frequency of micturition: Secondary | ICD-10-CM | POA: Diagnosis not present

## 2019-12-07 DIAGNOSIS — M85859 Other specified disorders of bone density and structure, unspecified thigh: Secondary | ICD-10-CM | POA: Diagnosis not present

## 2019-12-07 DIAGNOSIS — N898 Other specified noninflammatory disorders of vagina: Secondary | ICD-10-CM | POA: Diagnosis not present

## 2019-12-07 DIAGNOSIS — E559 Vitamin D deficiency, unspecified: Secondary | ICD-10-CM | POA: Diagnosis not present

## 2019-12-07 DIAGNOSIS — M359 Systemic involvement of connective tissue, unspecified: Secondary | ICD-10-CM | POA: Diagnosis not present

## 2019-12-07 DIAGNOSIS — N949 Unspecified condition associated with female genital organs and menstrual cycle: Secondary | ICD-10-CM | POA: Diagnosis not present

## 2019-12-07 DIAGNOSIS — E039 Hypothyroidism, unspecified: Secondary | ICD-10-CM | POA: Diagnosis not present

## 2019-12-08 ENCOUNTER — Encounter: Payer: Self-pay | Admitting: Rheumatology

## 2019-12-09 NOTE — Telephone Encounter (Signed)
I called patient and discussed the lab results at length.

## 2019-12-15 ENCOUNTER — Telehealth: Payer: Self-pay | Admitting: *Deleted

## 2019-12-15 NOTE — Telephone Encounter (Signed)
Lab results received drawn on 12/07/19 Reviewed by Hazel Sams, PA-C  ANA- Negative Ds DNA: 1 C3: 115 C4: 22

## 2020-01-18 ENCOUNTER — Other Ambulatory Visit: Payer: Self-pay | Admitting: Rheumatology

## 2020-01-18 DIAGNOSIS — M359 Systemic involvement of connective tissue, unspecified: Secondary | ICD-10-CM

## 2020-01-19 NOTE — Telephone Encounter (Addendum)
Last Visit: 05/29/19  Next Visit: 01/29/20 Labs: 12/07/19 MCV 99.3, MCH 33.8, Ly # 1.00 PLQ eye exam: 09/09/19  Normal.  Okay to refill per Dr. Estanislado Pandy

## 2020-01-25 NOTE — Progress Notes (Signed)
Office Visit Note  Patient: [name]             Date of Birth: 1945-11-29           MRN: [id number]             PCP: Cari Caraway, MD Referring: Cari Caraway, MD Visit Date: 01/29/2020 Occupation: @GUAROCC @  Subjective:  Trochanter bursitis bilaterally   History of Present Illness: [name] is a 74 y.o. female with history of autoimmune disease, Sjogren's syndrome, and osteoarthritis.  Patient is taking Plaquenil 200 mg 1 tablet by mouth twice daily Monday through Friday.  She has not missed any doses of Plaquenil recently.  She denies any signs or symptoms of an autoimmune disease flare.  She continues to have chronic sicca symptoms due to Sjogren's syndrome.  She is not had any recent rashes, oral nasal ulcerations, symptoms of Raynaud's, photosensitivity, enlarged lymph nodes, chest pain, or shortness of breath.  She states that she has been experiencing plantar fasciitis in the left foot and will be establishing care with a podiatrist on 02/08/2020.  She has tried conservative treatment but has not noticed any improvement in her symptoms.  She is wearing proper fitting shoes.  She presents today with trochanter bursitis bilaterally requested cortisone injections.   She has an upcoming DEXA on 02/02/20.    Activities of Daily Living:  Patient reports morning stiffness for 0  minutes.   Patient Denies nocturnal pain.  Difficulty dressing/grooming: Denies Difficulty climbing stairs: Denies Difficulty getting out of chair: Denies Difficulty using hands for taps, buttons, cutlery, and/or writing: Denies  Review of Systems  Constitutional: Negative for fatigue.  HENT: Positive for mouth dryness. Negative for mouth sores and nose dryness.   Eyes: Negative for pain, visual disturbance and dryness.  Respiratory: Negative for cough, hemoptysis, shortness of breath and difficulty breathing.   Cardiovascular: Negative for chest pain, palpitations, hypertension and swelling  in legs/feet.  Gastrointestinal: Negative for blood in stool, constipation and diarrhea.  Endocrine: Negative for increased urination.  Genitourinary: Negative for painful urination.  Musculoskeletal: Positive for arthralgias and joint pain. Negative for joint swelling, myalgias, muscle weakness, morning stiffness, muscle tenderness and myalgias.  Skin: Negative for color change, pallor, rash, hair loss, nodules/bumps, skin tightness, ulcers and sensitivity to sunlight.  Allergic/Immunologic: Negative for susceptible to infections.  Neurological: Negative for dizziness, numbness, headaches and weakness.  Hematological: Negative for swollen glands.  Psychiatric/Behavioral: Negative for depressed mood and sleep disturbance. The patient is not nervous/anxious.     PMFS History:  Patient Active Problem List   Diagnosis Date Noted  . Encounter for counseling 09/09/2018  . Osteopenia of multiple sites 08/22/2017  . History of vitamin D deficiency 08/22/2017  . Autoimmune disease ([place]) 10/23/2016  . Sjogren's syndrome ([place]) 10/09/2016  . Primary osteoarthritis of both hands 10/09/2016  . Primary osteoarthritis of both knees 10/09/2016  . High risk medication use 09/12/2016  . SBO (small bowel obstruction), partial postop 10/16/2011  . 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 Allergy   . Colon polyp   . Environmental allergies   . Hypothyroid   . Osteoarthritis   . Osteoarthritis   . Osteoarthritis   . Osteopenia   . Plantar fasciitis, right   . Raynauds phenomenon   . 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 Sjogren's syndrome ([place])   . Systemic lupus (  [place])     Family History  Problem Relation Age of Onset  . Heart disease Mother   . Psoriasis Father   . Arthritis Father   . Heart disease Brother   . Heart attack Brother   . Diabetes Maternal Grandfather   . Cancer  Maternal Uncle        colon  . Colon cancer Maternal Uncle 46  . Asthma Sister   . Breast cancer Neg Hx    Past Surgical History:  Procedure Laterality Date  . APPENDECTOMY    . BUNIONECTOMY  2008   right foot  . CATARACT EXTRACTION    . COLONOSCOPY W/ POLYPECTOMY  Aug 27, 2011  . ENDOMETRIAL ABLATION    . FOOT SURGERY Right 12/17/2017   bone spur  . ingrown toenail Right 12/16/2018  . LAPAROSCOPIC APPENDECTOMY  10/11/2011   Procedure: APPENDECTOMY LAPAROSCOPIC;  Surgeon: Adin Hector, MD;  Location: [facility];  Service: General;  Laterality: N/A;  Partial Cecectomy and Appendectomy  . WISDOM TOOTH EXTRACTION     Social History   Social History Narrative  . Not on file   Immunization History  Administered Date(s) Administered  . Influenza, High Dose Seasonal PF 07/05/2018, 06/18/2019  . Influenza,inj,quad, With Preservative 07/10/2017  . PFIZER SARS-COV-2 Vaccination 11/16/2019, 12/07/2019  . Pneumococcal Conjugate-13 09/08/2014  . Pneumococcal Polysaccharide-23 08/30/2011  . Tdap 06/20/2011  . Zoster 10/11/2007     Objective: Vital Signs: BP 125/77 (BP Location: Left Arm, Patient Position: Sitting, Cuff Size: Small)   Pulse 64   Resp 12   Ht 5\' 7"  (1.702 m)   Wt 154 lb 9.6 oz (70.1 kg)   BMI 24.21 kg/m    Physical Exam Vitals and nursing note reviewed.  Constitutional:      Appearance: She is well-developed.  HENT:     Head: Normocephalic and atraumatic.  Eyes:     Conjunctiva/sclera: Conjunctivae normal.  Pulmonary:     Effort: Pulmonary effort is normal.  Abdominal:     General: Bowel sounds are normal.     Palpations: Abdomen is soft.  Musculoskeletal:     Cervical back: Normal range of motion.  Lymphadenopathy:     Cervical: No cervical adenopathy.  Skin:    General: Skin is warm and dry.     Capillary Refill: Capillary refill takes less than 2 seconds.  Neurological:     Mental Status: She is alert and oriented to person, place, and time.    Psychiatric:        Behavior: Behavior normal.      Musculoskeletal Exam: C-spine, thoracic spine, and lumbar spine good ROM.  No midline spinal tenderness.  No SI joint tenderness.  Shoulder joints, elbow joints, wrist joints, MCPs, PIPs, and DIPs good ROM with no synovitis.  Complete fist formation bilaterally.  Hip joints, knee joints, ankle joints, MTPs, PIPs, and DIPs good ROM with no synovitis.  Bilateral knee crepitus.  No warmth or effusion noted.  No tenderness or swelling of ankle joints.  Tenderness over bilateral trochanteric bursa.   CDAI Exam: CDAI Score: -- Patient Global: --; Provider Global: -- Swollen: --; Tender: -- Joint Exam 01/29/2020   No joint exam has been documented for this visit   There is currently no information documented on the homunculus. Go to the Rheumatology activity and complete the homunculus joint exam.  Investigation: No additional findings.  Imaging: No results found.  Recent Labs: Lab Results  Component Value Date   WBC 4.7 05/26/2019  HGB 15.1 05/26/2019   PLT 256 05/26/2019   NA 138 05/26/2019   K 4.3 05/26/2019   CL 103 05/26/2019   CO2 30 05/26/2019   GLUCOSE 91 05/26/2019   BUN 11 05/26/2019   CREATININE 0.84 05/26/2019   BILITOT 0.5 05/26/2019   ALKPHOS 48 02/11/2017   AST 20 05/26/2019   ALT 12 05/26/2019   PROT 7.3 05/26/2019   ALBUMIN 3.9 02/11/2017   CALCIUM 9.6 05/26/2019   GFRAA 80 05/26/2019    Speciality Comments: PLQ eye exam: 09/10/2019 Normal. Dr. Rutherford Guys. Follow up in 1 year.  Procedures:  Large Joint Inj: bilateral greater trochanter on 01/29/2020 10:01 AM Indications: pain Details: 27 G 1.5 in needle, lateral approach  Arthrogram: No  Medications (Right): 1.5 mL lidocaine 1 %; 30 mg triamcinolone acetonide 40 MG/ML Medications (Left): 1.5 mL lidocaine 1 %; 40 mg triamcinolone acetonide 40 MG/ML Outcome: tolerated well, no immediate complications Procedure, treatment alternatives, risks and  benefits explained, specific risks discussed. Consent was given by the patient. Immediately prior to procedure a time out was called to verify the correct patient, procedure, equipment, support staff and site/side marked as required. Patient was prepped and draped in the usual sterile fashion.     Allergies: Latex and Sulfonamide derivatives   Assessment / Plan:     Visit Diagnoses: Autoimmune disease ([place]) - ANA and Raynaud's phenomena and arthritis: She has not had any signs or symptoms of an autoimmune disease flare recently.  She is clinically doing well on Plaquenil 200 mg 1 tablet by mouth twice daily Monday through Friday.  She has not missed any doses of Plaquenil recently.  She has not had any recent rashes, photosensitivity, oral or nasal ulcerations, symptoms of Raynaud's, enlarged lymph nodes, chest pain, or shortness of breath.  She continues to have chronic sicca symptoms and uses over-the-counter products for symptomatic relief.  No digital ulcerations or signs of gangrene were noted.  No malar rash noted.  She has no synovitis on exam.  ANA negative, double-stranded DNA 1, and complements within normal limits on 12/07/2019.  She will continue taking Plaquenil as prescribed.  She was advised to notify us if she develops signs or symptoms of a flare.  She will follow-up in the office in 5 months  High risk medication use - Plaquenil 200 mg 1 tablet by mouth twice daily Monday through Friday. eye exam: 09/10/2019.   Sjogren's syndrome with keratoconjunctivitis sicca ([place]): She has chronic sicca symptoms.  Her mouth dryness has been severe at times.  She continues to use Systane eyedrops twice daily as needed.  We discussed the use of over-the-counter products.  She will continue taking Plaquenil as prescribed.  Primary osteoarthritis of both hands: She has PIP and DIP thickening consistent with osteoarthritis of both hands.  No tenderness or synovitis was noted.  She has [place] joint prominence  bilaterally.  Joint protection and muscle strengthening were discussed.  Primary osteoarthritis of both knees - s/p euflexxa bilateral knees 07/2019-08/2019.  Doing well.  She has good range of motion with no discomfort.  No warmth or effusion was noted.  She has bilateral knee crepitus.  She notices significant improvement after bilateral Euflexxa injections.  Trochanteric bursitis of both hips: She presents today with trochanter bursitis bilaterally.  She has tenderness to palpation on exam.  She has been performing stretching exercises on a regular basis.  She had cortisone injections on 08/31/2019 which provided significant relief.  She requested repeat cortisone injections today.  She tolerated the procedure well.  The procedure note was completed above.  Aftercare was discussed.  Plantar fasciitis, right - She has been experiencing severe discomfort due to plantar fasciitis in the right foot.  She has tried stretching and icing without any improvement.  She is wearing proper fitting shoes.  She will be establishing care with a podiatrist on 02/08/2020.  Osteopenia of multiple sites - DEXA ordered by Dr. Addison Lank.  She is scheduled DEXA on 02/02/20.  She is taking calcium and vitamin D supplement daily.  History of vitamin D deficiency: She is taking vitamin D 2000 units daily.  Other fatigue: She is not experiencing any fatigue at this time.  History of depression: She takes Lexapro 10 mg 1 tablet by mouth daily which has been effective.  History of anxiety: Managed while taking Lexapro 10 mg 1 tablet by mouth daily.  Orders: Orders Placed This Encounter  Procedures  . Large Joint Inj   No orders of the defined types were placed in this encounter.   Face-to-face time spent with patient was 30 minutes. Greater than 50% of time was spent in counseling and coordination of care.  Follow-Up Instructions: Return in about 5 months (around 06/30/2020) for Autoimmune Disease.   Ofilia Neas,  PA-C  I examined and evaluated the patient with Hazel Sams PA.  Patient's autoimmune disease appears to be stable.  She had no synovitis on examination.  She has been having a lot of discomfort over trochanteric area.  Per her request bilateral trochanteric bursae were injected as described above.  She tolerated the procedure well. The plan of care was discussed as noted above.  Bo Merino, MD Note - This record has been created using Editor, commissioning.  Chart creation errors have been sought, but may not always  have been located. Such creation errors do not reflect on  the standard of medical care.

## 2020-01-26 ENCOUNTER — Encounter: Payer: Self-pay | Admitting: Rheumatology

## 2020-01-29 ENCOUNTER — Ambulatory Visit (INDEPENDENT_AMBULATORY_CARE_PROVIDER_SITE_OTHER): Payer: Medicare Other | Admitting: Rheumatology

## 2020-01-29 ENCOUNTER — Encounter: Payer: Self-pay | Admitting: Rheumatology

## 2020-01-29 ENCOUNTER — Other Ambulatory Visit: Payer: Self-pay

## 2020-01-29 VITALS — BP 125/77 | HR 64 | Resp 12 | Ht 67.0 in | Wt 154.6 lb

## 2020-01-29 DIAGNOSIS — M7061 Trochanteric bursitis, right hip: Secondary | ICD-10-CM

## 2020-01-29 DIAGNOSIS — Z8639 Personal history of other endocrine, nutritional and metabolic disease: Secondary | ICD-10-CM

## 2020-01-29 DIAGNOSIS — R5383 Other fatigue: Secondary | ICD-10-CM

## 2020-01-29 DIAGNOSIS — M170 Bilateral primary osteoarthritis of knee: Secondary | ICD-10-CM

## 2020-01-29 DIAGNOSIS — M3501 Sicca syndrome with keratoconjunctivitis: Secondary | ICD-10-CM

## 2020-01-29 DIAGNOSIS — Z79899 Other long term (current) drug therapy: Secondary | ICD-10-CM | POA: Diagnosis not present

## 2020-01-29 DIAGNOSIS — M19042 Primary osteoarthritis, left hand: Secondary | ICD-10-CM | POA: Diagnosis not present

## 2020-01-29 DIAGNOSIS — M722 Plantar fascial fibromatosis: Secondary | ICD-10-CM | POA: Diagnosis not present

## 2020-01-29 DIAGNOSIS — M8589 Other specified disorders of bone density and structure, multiple sites: Secondary | ICD-10-CM | POA: Diagnosis not present

## 2020-01-29 DIAGNOSIS — Z8659 Personal history of other mental and behavioral disorders: Secondary | ICD-10-CM

## 2020-01-29 DIAGNOSIS — M19041 Primary osteoarthritis, right hand: Secondary | ICD-10-CM | POA: Diagnosis not present

## 2020-01-29 DIAGNOSIS — M7062 Trochanteric bursitis, left hip: Secondary | ICD-10-CM

## 2020-01-29 DIAGNOSIS — M359 Systemic involvement of connective tissue, unspecified: Secondary | ICD-10-CM

## 2020-02-02 ENCOUNTER — Ambulatory Visit
Admission: RE | Admit: 2020-02-02 | Discharge: 2020-02-02 | Disposition: A | Discharge status: Home / Self Care | Payer: Medicare Other | Source: Ambulatory Visit | Attending: Family Medicine | Admitting: Family Medicine

## 2020-02-02 ENCOUNTER — Other Ambulatory Visit: Payer: Self-pay

## 2020-02-02 DIAGNOSIS — Z780 Asymptomatic menopausal state: Secondary | ICD-10-CM | POA: Diagnosis not present

## 2020-02-02 DIAGNOSIS — M85852 Other specified disorders of bone density and structure, left thigh: Secondary | ICD-10-CM | POA: Diagnosis not present

## 2020-02-02 DIAGNOSIS — M8580 Other specified disorders of bone density and structure, unspecified site: Secondary | ICD-10-CM

## 2020-02-04 DIAGNOSIS — L821 Other seborrheic keratosis: Secondary | ICD-10-CM | POA: Diagnosis not present

## 2020-02-04 DIAGNOSIS — L814 Other melanin hyperpigmentation: Secondary | ICD-10-CM | POA: Diagnosis not present

## 2020-02-04 DIAGNOSIS — D225 Melanocytic nevi of trunk: Secondary | ICD-10-CM | POA: Diagnosis not present

## 2020-02-04 DIAGNOSIS — L578 Other skin changes due to chronic exposure to nonionizing radiation: Secondary | ICD-10-CM | POA: Diagnosis not present

## 2020-02-04 DIAGNOSIS — L820 Inflamed seborrheic keratosis: Secondary | ICD-10-CM | POA: Diagnosis not present

## 2020-02-04 DIAGNOSIS — Z85828 Personal history of other malignant neoplasm of skin: Secondary | ICD-10-CM | POA: Diagnosis not present

## 2020-02-08 DIAGNOSIS — M722 Plantar fascial fibromatosis: Secondary | ICD-10-CM | POA: Diagnosis not present

## 2020-02-08 DIAGNOSIS — M79671 Pain in right foot: Secondary | ICD-10-CM | POA: Diagnosis not present

## 2020-02-08 DIAGNOSIS — M792 Neuralgia and neuritis, unspecified: Secondary | ICD-10-CM | POA: Diagnosis not present

## 2020-02-08 DIAGNOSIS — L03031 Cellulitis of right toe: Secondary | ICD-10-CM | POA: Diagnosis not present

## 2020-02-19 DIAGNOSIS — F3342 Major depressive disorder, recurrent, in full remission: Secondary | ICD-10-CM | POA: Diagnosis not present

## 2020-02-19 DIAGNOSIS — M1990 Unspecified osteoarthritis, unspecified site: Secondary | ICD-10-CM | POA: Diagnosis not present

## 2020-02-19 DIAGNOSIS — E039 Hypothyroidism, unspecified: Secondary | ICD-10-CM | POA: Diagnosis not present

## 2020-05-09 ENCOUNTER — Other Ambulatory Visit: Payer: Self-pay | Admitting: Family Medicine

## 2020-05-09 DIAGNOSIS — Z1231 Encounter for screening mammogram for malignant neoplasm of breast: Secondary | ICD-10-CM

## 2020-05-14 ENCOUNTER — Other Ambulatory Visit: Payer: Self-pay | Admitting: Rheumatology

## 2020-05-14 DIAGNOSIS — M359 Systemic involvement of connective tissue, unspecified: Secondary | ICD-10-CM

## 2020-05-16 NOTE — Telephone Encounter (Addendum)
Last Visit: 01/29/2020 Next Visit: 07/01/2020 Labs: CBC/CMP 05/26/2019 WNLs Eye exam: 09/10/2019.  Current Dose per office note 01/29/2020: Plaquenil 200 mg 1 tablet by mouth twice daily Monday through Friday. Lexapro 10 mg 1 tablet by mouth daily.  [id number] disease   Patient advised she is due to update labs. Patient will come to update labs 05/17/2020.  Okay to refill 30 day supply PLQ?

## 2020-05-17 ENCOUNTER — Other Ambulatory Visit: Payer: Self-pay | Admitting: *Deleted

## 2020-05-17 ENCOUNTER — Encounter: Payer: Self-pay | Admitting: Rheumatology

## 2020-05-17 DIAGNOSIS — Z79899 Other long term (current) drug therapy: Secondary | ICD-10-CM | POA: Diagnosis not present

## 2020-05-17 DIAGNOSIS — E039 Hypothyroidism, unspecified: Secondary | ICD-10-CM | POA: Diagnosis not present

## 2020-05-17 DIAGNOSIS — M1990 Unspecified osteoarthritis, unspecified site: Secondary | ICD-10-CM | POA: Diagnosis not present

## 2020-05-17 DIAGNOSIS — F3342 Major depressive disorder, recurrent, in full remission: Secondary | ICD-10-CM | POA: Diagnosis not present

## 2020-05-18 ENCOUNTER — Other Ambulatory Visit: Payer: Self-pay | Admitting: *Deleted

## 2020-05-18 LAB — COMPLETE METABOLIC PANEL WITH GFR
AG Ratio: 1.7 (calc) (ref 1.0–2.5)
ALT: 12 U/L (ref 6–29)
AST: 18 U/L (ref 10–35)
Albumin: 4.1 g/dL (ref 3.6–5.1)
Alkaline phosphatase (APISO): 45 U/L (ref 37–153)
BUN/Creatinine Ratio: 13 (calc) (ref 6–22)
BUN: 13 mg/dL (ref 7–25)
CO2: 28 mmol/L (ref 20–32)
Calcium: 9.6 mg/dL (ref 8.6–10.4)
Chloride: 105 mmol/L (ref 98–110)
Creat: 1 mg/dL — ABNORMAL HIGH (ref 0.60–0.93)
GFR, Est African American: 65 mL/min/{1.73_m2} (ref >=60)
GFR, Est Non African American: 56 mL/min/{1.73_m2} — ABNORMAL LOW (ref >=60)
Globulin: 2.4 g/dL (calc) (ref 1.9–3.7)
Glucose, Bld: 59 mg/dL — ABNORMAL LOW (ref 65–99)
Potassium: 3.7 mmol/L (ref 3.5–5.3)
Sodium: 142 mmol/L (ref 135–146)
Total Bilirubin: 0.4 mg/dL (ref 0.2–1.2)
Total Protein: 6.5 g/dL (ref 6.1–8.1)

## 2020-05-18 LAB — CBC WITH DIFFERENTIAL/PLATELET
Absolute Monocytes: 545 cells/uL (ref 200–950)
Basophils Absolute: 50 cells/uL (ref 0–200)
Basophils Relative: 1 %
Eosinophils Absolute: 210 cells/uL (ref 15–500)
Eosinophils Relative: 4.2 %
HCT: 40.2 % (ref 35.0–45.0)
Hemoglobin: 13.7 g/dL (ref 11.7–15.5)
Lymphs Abs: 755 cells/uL — ABNORMAL LOW (ref 850–3900)
MCH: 34.1 pg — ABNORMAL HIGH (ref 27.0–33.0)
MCHC: 34.1 g/dL (ref 32.0–36.0)
MCV: 100 fL (ref 80.0–100.0)
MPV: 10.8 fL (ref 7.5–12.5)
Monocytes Relative: 10.9 %
Neutro Abs: 3440 cells/uL (ref 1500–7800)
Neutrophils Relative %: 68.8 %
Platelets: 236 10*3/uL (ref 140–400)
RBC: 4.02 10*6/uL (ref 3.80–5.10)
RDW: 11.6 % (ref 11.0–15.0)
Total Lymphocyte: 15.1 %
WBC: 5 10*3/uL (ref 3.8–10.8)

## 2020-05-18 MED ORDER — HYDROXYCHLOROQUINE SULFATE 200 MG PO TABS: 200.0000 mg | ORAL_TABLET | Freq: Every day | ORAL | 0 refills | Status: DC

## 2020-05-18 NOTE — Telephone Encounter (Signed)
-----   Message from Bo Merino, MD sent at 05/18/2020  8:10 AM EDT ----- Lymphocyte count is decreased but stable.  GFR is lower.  She is on Plaquenil.  Patient is clinically doing well.  Please advise her to decrease Plaquenil to 1 tablet p.o. daily instead of 1 tablet p.o. twice daily Monday to Friday.  We should repeat  CBC with differential and CMP with GFR in 3 months.  Patient should notify us if she develops a flare.

## 2020-05-18 NOTE — Progress Notes (Signed)
Lymphocyte count is decreased but stable.  GFR is lower.  She is on Plaquenil.  Patient is clinically doing well.  Please advise her to decrease Plaquenil to 1 tablet p.o. daily instead of 1 tablet p.o. twice daily Monday to Friday.  We should repeat CBC with differential and CMP with GFR in 3 months.  Patient should notify us if she develops a flare.

## 2020-05-19 ENCOUNTER — Telehealth: Payer: Self-pay | Admitting: *Deleted

## 2020-05-19 NOTE — Telephone Encounter (Signed)
I returned patient's call.  I discussed that her lymphocyte count was low and for that reason I decreased her Plaquenil.  Her autoimmune diseases not active.  Her labs done at her PCPs office showed ANA negative, double-stranded DNA negative and complements normal.  She was concerned about elevated creatinine.  I have advised her to increase fluid intake.  She can have repeat BMP with GFR either at her PCPs office or over here.  She wants to discuss this further with her PCP.

## 2020-05-19 NOTE — Telephone Encounter (Signed)
Patient is requesting a call from Dr. Estanislado Pandy to discuss lab results.

## 2020-05-26 NOTE — Telephone Encounter (Signed)
Patient has been scheduled for a hip injection on 06/01/2020

## 2020-05-26 NOTE — Telephone Encounter (Signed)
Ok to apply for repeat visco gel injections for both knees.    Ok to schedule trochanteric bursa cortisone injection.

## 2020-05-31 NOTE — Progress Notes (Signed)
Office Visit Note  Patient: [name]             Date of Birth: Jul 05, 1946           MRN: [id number]             PCP: Cari Caraway, MD Referring: Cari Caraway, MD Visit Date: 06/01/2020 Occupation: @GUAROCC @  Subjective:  Other (bilateral hip pain, patient requested hip injections )   History of Present Illness: [name] is a 74 y.o. female with history of Sjogren's, osteoarthritis.  She states she is started doing some knee joint exercises and that made her trochanteric bursitis worse.  She has had chronic left trochanteric bursitis but recently she has been experiencing increased pain in her right trochanteric bursa as well.  She had cortisone injection to the lateral trochanteric bursa in April 2021.  It gave her good relief for few months but now the pain has recurred.  Her sicca symptoms are manageable.  She continues to have pain and discomfort in her knee joints.  She states her left knee joint comes sometimes gives out on her.  She has buckling sensation in her left knee joint.  Activities of Daily Living:  Patient reports morning stiffness for a few minutes.   Patient Reports nocturnal pain.  Difficulty dressing/grooming: Denies Difficulty climbing stairs: Denies Difficulty getting out of chair: Denies Difficulty using hands for taps, buttons, cutlery, and/or writing: Denies  Review of Systems  Constitutional: Negative for fatigue.  HENT: Positive for mouth dryness and nose dryness. Negative for mouth sores.   Eyes: Positive for dryness.  Respiratory: Negative for shortness of breath and difficulty breathing.   Cardiovascular: Negative for chest pain and palpitations.  Gastrointestinal: Negative for blood in stool, constipation and diarrhea.  Endocrine: Negative for increased urination.  Genitourinary: Negative for difficulty urinating.  Musculoskeletal: Positive for myalgias, muscle weakness, morning stiffness, muscle tenderness and myalgias. Negative for  arthralgias, joint pain and joint swelling.  Skin: Negative for color change, rash and redness.  Allergic/Immunologic: Negative for susceptible to infections.  Neurological: Negative for dizziness, numbness, headaches, memory loss and weakness.  Hematological: Positive for bruising/bleeding tendency.  Psychiatric/Behavioral: Negative for confusion and sleep disturbance.    PMFS History:  Patient Active Problem List   Diagnosis Date Noted  . Encounter for counseling 09/09/2018  . Osteopenia of multiple sites 08/22/2017  . History of vitamin D deficiency 08/22/2017  . Autoimmune disease ([place]) 10/23/2016  . Sjogren's syndrome ([place]) 10/09/2016  . Primary osteoarthritis of both hands 10/09/2016  . Primary osteoarthritis of both knees 10/09/2016  . High risk medication use 09/12/2016  . SBO (small bowel obstruction), partial postop 10/16/2011  . 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 Allergy   . Colon polyp   . Environmental allergies   . Hypothyroid   . Osteoarthritis   . Osteoarthritis   . Osteoarthritis   . Osteopenia   . Plantar fasciitis, right   . Raynauds phenomenon   . 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 Sjogren's syndrome ([place])   . Systemic lupus (HCC)     Family History  Problem Relation Age of Onset  . Heart disease Mother   . Psoriasis Father   . Arthritis Father   . Heart disease Brother   . Heart attack Brother   . Diabetes Maternal Grandfather   . Cancer Maternal Uncle  colon  . Colon cancer Maternal Uncle 80  . Asthma Sister   . Breast cancer Neg Hx    Past Surgical History:  Procedure Laterality Date  . APPENDECTOMY    . BUNIONECTOMY  2008   right foot  . CATARACT EXTRACTION    . COLONOSCOPY W/ POLYPECTOMY  Aug 27, 2011  . ENDOMETRIAL ABLATION    . FOOT SURGERY Right 12/17/2017   bone spur  . ingrown toenail Right  12/16/2018  . LAPAROSCOPIC APPENDECTOMY  10/11/2011   Procedure: APPENDECTOMY LAPAROSCOPIC;  Surgeon: Adin Hector, MD;  Location: [facility];  Service: General;  Laterality: N/A;  Partial Cecectomy and Appendectomy  . WISDOM TOOTH EXTRACTION     Social History   Social History Narrative  . Not on file   Immunization History  Administered Date(s) Administered  . Influenza, High Dose Seasonal PF 07/05/2018, 06/18/2019  . Influenza,inj,quad, With Preservative 07/10/2017  . PFIZER SARS-COV-2 Vaccination 11/16/2019, 12/07/2019  . Pneumococcal Conjugate-13 09/08/2014  . Pneumococcal Polysaccharide-23 08/30/2011  . Tdap 06/20/2011  . Zoster 10/11/2007     Objective: Vital Signs: BP 114/73 (BP Location: Right Arm, Patient Position: Sitting, Cuff Size: Normal)   Pulse 60   Resp 13   Ht 5' 6.25" (1.683 m)   Wt 154 lb 12.8 oz (70.2 kg)   BMI 24.80 kg/m    Physical Exam Vitals and nursing note reviewed.  Constitutional:      Appearance: She is well-developed.  HENT:     Head: Normocephalic and atraumatic.  Eyes:     Conjunctiva/sclera: Conjunctivae normal.  Cardiovascular:     Rate and Rhythm: Normal rate and regular rhythm.     Heart sounds: Normal heart sounds.  Pulmonary:     Effort: Pulmonary effort is normal.     Breath sounds: Normal breath sounds.  Abdominal:     General: Bowel sounds are normal.     Palpations: Abdomen is soft.  Musculoskeletal:     Cervical back: Normal range of motion.  Lymphadenopathy:     Cervical: No cervical adenopathy.  Skin:    General: Skin is warm and dry.     Capillary Refill: Capillary refill takes less than 2 seconds.  Neurological:     Mental Status: She is alert and oriented to person, place, and time.  Psychiatric:        Behavior: Behavior normal.      Musculoskeletal Exam: C-spine was in range of motion.  Shoulder joints, elbow joints, wrist joints with good range of motion.  She has some PIP and DIP thickening with no  synovitis.  She has thickening of bilateral second and third MCP joint with no synovitis.  Hip joints with good range of motion.  She had tenderness over her trochanteric bursa.  She had discomfort range of motion of bilateral knee joints with no warmth swelling or effusion.  CDAI Exam: CDAI Score: -- Patient Global: --; Provider Global: -- Swollen: --; Tender: -- Joint Exam 06/01/2020   No joint exam has been documented for this visit   There is currently no information documented on the homunculus. Go to the Rheumatology activity and complete the homunculus joint exam.  Investigation: No additional findings.  Imaging: No results found.  Recent Labs: Lab Results  Component Value Date   WBC 5.0 05/17/2020   HGB 13.7 05/17/2020   PLT 236 05/17/2020   NA 142 05/17/2020   K 3.7 05/17/2020   CL 105 05/17/2020   CO2 28 05/17/2020  GLUCOSE 59 (L) 05/17/2020   BUN 13 05/17/2020   CREATININE 1.00 (H) 05/17/2020   BILITOT 0.4 05/17/2020   ALKPHOS 48 02/11/2017   AST 18 05/17/2020   ALT 12 05/17/2020   PROT 6.5 05/17/2020   ALBUMIN 3.9 02/11/2017   CALCIUM 9.6 05/17/2020   GFRAA 65 05/17/2020    Speciality Comments: PLQ eye exam: 09/10/2019 Normal. Dr. Rutherford Guys. Follow up in 1 year.  Procedures:  Large Joint Inj: bilateral greater trochanter on 06/01/2020 9:37 AM Indications: pain Details: 27 G 1.5 in needle, lateral approach  Arthrogram: No  Medications (Right): 1.5 mL lidocaine 1 %; 40 mg triamcinolone acetonide 40 MG/ML Medications (Left): 1.5 mL lidocaine 1 %; 40 mg triamcinolone acetonide 40 MG/ML Outcome: tolerated well, no immediate complications Procedure, treatment alternatives, risks and benefits explained, specific risks discussed. Consent was given by the patient. Immediately prior to procedure a time out was called to verify the correct patient, procedure, equipment, support staff and site/side marked as required. Patient was prepped and draped in the usual  sterile fashion.     Allergies: Latex and Sulfonamide derivatives   Assessment / Plan:     Visit Diagnoses: Autoimmune disease ([place]) - ANA and Raynaud's phenomena and arthritis: She has been doing well on Plaquenil.  She is continues to have some Raynaud's symptoms.  She had no synovitis on examination today.  High risk medication use - Plaquenil 200 mg 1 tablet by mouth twice daily Monday through Friday. eye exam: 09/10/2019.  She is aware of getting eye examination this year.  She will return in December for her labs.  Sjogren's syndrome with keratoconjunctivitis sicca (HCC)-she complains of sicca symptoms.  I will repeat her antibodies in December with her next blood work.  Primary osteoarthritis of both hands-joint protection muscle strengthening was discussed.  Primary osteoarthritis of both knees - s/p euflexxa bilateral knees 07/2019-08/2019.  She had good response to Euflexxa injections in the past.  She wants to get repeat Euflexxa injections.  She has been experiencing increased pain and discomfort in her knee joints.  She states her left knee joints buckles up and gives out on her.  We will get x-rays of her knee joints.  Trochanteric bursitis of both hips - injected 01/29/2020.  She had good response to trochanteric bursa injection.  Now the pain has come back.  She is having difficulty walking.  Stretching exercises were emphasized.  Per her request bilateral trochanteric bursae were injected with cortisone which she tolerated well.  Osteopenia of multiple sites - DEXA ordered by Dr. Addison Lank. February 02, 2020 showed T score of -2.2 which was a stable.  She is on Boniva once a month.  History of vitamin D deficiency-she is on vitamin D supplement.  Other fatigue  History of anxiety - Managed while taking Lexapro 10 mg 1 tablet by mouth daily.  History of depression   She is fully vaccinated against COVID-19.  Her last vaccination was in February.  Use of mask, social distancing  and hand hygiene was emphasized.  I also discussed with case she develops COVID-19 infection she may be a candidate for monoclonal antibody infusion.  Orders: Orders Placed This Encounter  Procedures  . Large Joint Inj: bilateral greater trochanter  . CBC with Differential/Platelet  . COMPLETE METABOLIC PANEL WITH GFR  . Urinalysis, Routine w reflex microscopic  . Anti-DNA antibody, double-stranded  . C3 and C4  . Sedimentation rate  . ANA  . Sjogrens syndrome-A extractable nuclear antibody  .  Sjogrens syndrome-B extractable nuclear antibody  . Serum protein electrophoresis with reflex   No orders of the defined types were placed in this encounter.    Follow-Up Instructions: Return in about 5 months (around 11/01/2020) for Autoimmune disease.   Bo Merino, MD  Note - This record has been created using Editor, commissioning.  Chart creation errors have been sought, but may not always  have been located. Such creation errors do not reflect on  the standard of medical care.

## 2020-06-01 ENCOUNTER — Encounter: Payer: Self-pay | Admitting: Rheumatology

## 2020-06-01 ENCOUNTER — Ambulatory Visit (INDEPENDENT_AMBULATORY_CARE_PROVIDER_SITE_OTHER): Payer: Medicare Other | Admitting: Rheumatology

## 2020-06-01 ENCOUNTER — Other Ambulatory Visit: Payer: Self-pay

## 2020-06-01 VITALS — BP 114/73 | HR 60 | Resp 13 | Ht 66.25 in | Wt 154.8 lb

## 2020-06-01 DIAGNOSIS — M19041 Primary osteoarthritis, right hand: Secondary | ICD-10-CM

## 2020-06-01 DIAGNOSIS — M7061 Trochanteric bursitis, right hip: Secondary | ICD-10-CM

## 2020-06-01 DIAGNOSIS — M19042 Primary osteoarthritis, left hand: Secondary | ICD-10-CM | POA: Diagnosis not present

## 2020-06-01 DIAGNOSIS — Z8639 Personal history of other endocrine, nutritional and metabolic disease: Secondary | ICD-10-CM

## 2020-06-01 DIAGNOSIS — R944 Abnormal results of kidney function studies: Secondary | ICD-10-CM | POA: Diagnosis not present

## 2020-06-01 DIAGNOSIS — R5383 Other fatigue: Secondary | ICD-10-CM | POA: Diagnosis not present

## 2020-06-01 DIAGNOSIS — E039 Hypothyroidism, unspecified: Secondary | ICD-10-CM | POA: Diagnosis not present

## 2020-06-01 DIAGNOSIS — M170 Bilateral primary osteoarthritis of knee: Secondary | ICD-10-CM | POA: Diagnosis not present

## 2020-06-01 DIAGNOSIS — M359 Systemic involvement of connective tissue, unspecified: Secondary | ICD-10-CM | POA: Diagnosis not present

## 2020-06-01 DIAGNOSIS — M7062 Trochanteric bursitis, left hip: Secondary | ICD-10-CM

## 2020-06-01 DIAGNOSIS — M3501 Sicca syndrome with keratoconjunctivitis: Secondary | ICD-10-CM

## 2020-06-01 DIAGNOSIS — Z79899 Other long term (current) drug therapy: Secondary | ICD-10-CM

## 2020-06-01 DIAGNOSIS — M8589 Other specified disorders of bone density and structure, multiple sites: Secondary | ICD-10-CM

## 2020-06-01 DIAGNOSIS — Z8659 Personal history of other mental and behavioral disorders: Secondary | ICD-10-CM

## 2020-06-01 DIAGNOSIS — E559 Vitamin D deficiency, unspecified: Secondary | ICD-10-CM | POA: Diagnosis not present

## 2020-06-01 MED ORDER — TRIAMCINOLONE ACETONIDE 40 MG/ML IJ SUSP
40.0000 mg | INTRAMUSCULAR | Status: AC | PRN
  Administered 2020-06-01: 40 mg via INTRA_ARTICULAR

## 2020-06-01 MED ORDER — LIDOCAINE HCL 1 % IJ SOLN
1.5000 mL | INTRAMUSCULAR | Status: AC | PRN
  Administered 2020-06-01: 1.5 mL

## 2020-06-01 NOTE — Telephone Encounter (Signed)
Please call to schedule patient for Visco injections.  Authorized for [employer] series Bilateral Knees. Buy and Bill.  Deductible has been met.  No PA required.  Insurance to cover 100% of allowable cost. Agricultural engineer )

## 2020-06-01 NOTE — Patient Instructions (Addendum)
COVID-19 vaccine recommendations:   COVID-19 vaccine is recommended for everyone (unless you are allergic to a vaccine component), even if you are on a medication that suppresses your immune system.   If you are on Methotrexate, Cellcept (mycophenolate), Rinvoq, Morrie Sheldon, and Olumiant- hold the medication for 1 week after each vaccine. Hold Methotrexate for 2 weeks after the single dose COVID-19 vaccine.   If you are on Orencia subcutaneous injection - hold medication one week prior to and one week after the first COVID-19 vaccine dose (only).   If you are on Orencia IV infusions- time vaccination administration so that the first COVID-19 vaccination will occur four weeks after the infusion and postpone the subsequent infusion by one week.   If you are on Cyclophosphamide or Rituxan infusions please contact your doctor prior to receiving the COVID-19 vaccine.   Do not take Tylenol or ant anti-inflammatory medications (NSAIDs) 24 hours prior to the COVID-19 vaccination.   There is no direct evidence about the efficacy of the COVID-19 vaccine in individuals who are on medications that suppress the immune system.   Even if you are fully vaccinated, and you are on any medications that suppress your immune system, please continue to wear a mask, maintain at least six feet social distance and practice hand hygiene.   If you develop a COVID-19 infection, please contact your PCP or our office to determine if you need antibody infusion.  We anticipate that a booster vaccine will be available soon for immunosuppressed individuals. Please cal our office before receiving your booster dose to make adjustments to your medication regimen.    Please come in for lab work in December 2021

## 2020-06-06 ENCOUNTER — Other Ambulatory Visit: Payer: Self-pay

## 2020-06-06 ENCOUNTER — Encounter: Payer: Self-pay | Admitting: Rheumatology

## 2020-06-06 ENCOUNTER — Ambulatory Visit (INDEPENDENT_AMBULATORY_CARE_PROVIDER_SITE_OTHER): Payer: Medicare Other | Admitting: Physician Assistant

## 2020-06-06 ENCOUNTER — Ambulatory Visit: Payer: Self-pay

## 2020-06-06 DIAGNOSIS — E559 Vitamin D deficiency, unspecified: Secondary | ICD-10-CM | POA: Diagnosis not present

## 2020-06-06 DIAGNOSIS — M25561 Pain in right knee: Secondary | ICD-10-CM

## 2020-06-06 DIAGNOSIS — M329 Systemic lupus erythematosus, unspecified: Secondary | ICD-10-CM | POA: Diagnosis not present

## 2020-06-06 DIAGNOSIS — Z01411 Encounter for gynecological examination (general) (routine) with abnormal findings: Secondary | ICD-10-CM | POA: Diagnosis not present

## 2020-06-06 DIAGNOSIS — E039 Hypothyroidism, unspecified: Secondary | ICD-10-CM | POA: Diagnosis not present

## 2020-06-06 DIAGNOSIS — Z0000 Encounter for general adult medical examination without abnormal findings: Secondary | ICD-10-CM | POA: Diagnosis not present

## 2020-06-06 DIAGNOSIS — G8929 Other chronic pain: Secondary | ICD-10-CM

## 2020-06-06 DIAGNOSIS — M170 Bilateral primary osteoarthritis of knee: Secondary | ICD-10-CM

## 2020-06-06 DIAGNOSIS — N898 Other specified noninflammatory disorders of vagina: Secondary | ICD-10-CM | POA: Diagnosis not present

## 2020-06-06 DIAGNOSIS — M25562 Pain in left knee: Secondary | ICD-10-CM | POA: Diagnosis not present

## 2020-06-06 DIAGNOSIS — I7300 Raynaud's syndrome without gangrene: Secondary | ICD-10-CM | POA: Diagnosis not present

## 2020-06-06 DIAGNOSIS — N952 Postmenopausal atrophic vaginitis: Secondary | ICD-10-CM | POA: Diagnosis not present

## 2020-06-06 DIAGNOSIS — F3342 Major depressive disorder, recurrent, in full remission: Secondary | ICD-10-CM | POA: Diagnosis not present

## 2020-06-06 MED ORDER — SODIUM HYALURONATE (VISCOSUP) 20 MG/2ML IX SOSY
20.0000 mg | PREFILLED_SYRINGE | INTRA_ARTICULAR | Status: AC | PRN
  Administered 2020-06-06: 20 mg via INTRA_ARTICULAR

## 2020-06-06 MED ORDER — LIDOCAINE HCL 1 % IJ SOLN
1.5000 mL | INTRAMUSCULAR | Status: AC | PRN
  Administered 2020-06-06: 1.5 mL

## 2020-06-06 NOTE — Progress Notes (Signed)
   Procedure Note  Patient: [name]             Date of Birth: 03/04/1946           MRN: [id number]             Visit Date: 06/06/2020  Procedures: Visit Diagnoses:  1. Chronic pain of both knees   2. Primary osteoarthritis of both knees   3. Chronic pain of left knee     euflexxa #1 bilateral knees, B/B Large Joint Inj: bilateral knee on 06/06/2020 8:28 AM Indications: pain Details: 27 G 1.5 in needle, medial approach  Arthrogram: No  Medications (Right): 1.5 mL lidocaine 1 %; 20 mg Sodium Hyaluronate 20 MG/2ML Aspirate (Right): 0 mL Medications (Left): 1.5 mL lidocaine 1 %; 20 mg Sodium Hyaluronate 20 MG/2ML Aspirate (Left): 0 mL Outcome: tolerated well, no immediate complications Procedure, treatment alternatives, risks and benefits explained, specific risks discussed. Consent was given by the patient. Immediately prior to procedure a time out was called to verify the correct patient, procedure, equipment, support staff and site/side marked as required. Patient was prepped and draped in the usual sterile fashion.     Patient tolerated the procedure well.  Aftercare was discussed.  Hazel Sams, PA-C

## 2020-06-06 NOTE — Telephone Encounter (Signed)
Hazel Sams, PA-C discussed with patient today in the office.

## 2020-06-08 DIAGNOSIS — M25562 Pain in left knee: Secondary | ICD-10-CM | POA: Diagnosis not present

## 2020-06-08 DIAGNOSIS — M25561 Pain in right knee: Secondary | ICD-10-CM | POA: Diagnosis not present

## 2020-06-13 ENCOUNTER — Ambulatory Visit (INDEPENDENT_AMBULATORY_CARE_PROVIDER_SITE_OTHER): Payer: Medicare Other | Admitting: Physician Assistant

## 2020-06-13 ENCOUNTER — Other Ambulatory Visit: Payer: Self-pay

## 2020-06-13 DIAGNOSIS — M170 Bilateral primary osteoarthritis of knee: Secondary | ICD-10-CM

## 2020-06-13 MED ORDER — SODIUM HYALURONATE (VISCOSUP) 20 MG/2ML IX SOSY
20.0000 mg | PREFILLED_SYRINGE | INTRA_ARTICULAR | Status: AC | PRN
Start: 2020-06-13 — End: 2020-06-13
  Administered 2020-06-13: 20 mg via INTRA_ARTICULAR

## 2020-06-13 MED ORDER — LIDOCAINE HCL 1 % IJ SOLN
1.5000 mL | INTRAMUSCULAR | Status: AC | PRN
  Administered 2020-06-13: 1.5 mL

## 2020-06-13 NOTE — Progress Notes (Signed)
   Procedure Note  Patient: [name]             Date of Birth: Jan 08, 1946           MRN: [id number]             Visit Date: 06/13/2020  Procedures: Visit Diagnoses:  1. Primary osteoarthritis of both knees      Euflexxa #2 bilateral knee, B/B Large Joint Inj: bilateral knee on 06/13/2020 11:47 AM Indications: pain Details: 27 G 1.5 in needle, medial approach  Arthrogram: No  Medications (Right): 1.5 mL lidocaine 1 %; 20 mg Sodium Hyaluronate 20 MG/2ML Aspirate (Right): 0 mL Medications (Left): 1.5 mL lidocaine 1 %; 20 mg Sodium Hyaluronate 20 MG/2ML Aspirate (Left): 0 mL Outcome: tolerated well, no immediate complications Procedure, treatment alternatives, risks and benefits explained, specific risks discussed. Consent was given by the patient. Immediately prior to procedure a time out was called to verify the correct patient, procedure, equipment, support staff and site/side marked as required. Patient was prepped and draped in the usual sterile fashion.     Patient tolerated the procedure well.  Aftercare was discussed.   Hazel Sams, PA-C

## 2020-06-14 ENCOUNTER — Telehealth: Payer: Self-pay | Admitting: Rheumatology

## 2020-06-14 ENCOUNTER — Other Ambulatory Visit: Payer: Self-pay | Admitting: *Deleted

## 2020-06-14 DIAGNOSIS — M25562 Pain in left knee: Secondary | ICD-10-CM

## 2020-06-14 DIAGNOSIS — G8929 Other chronic pain: Secondary | ICD-10-CM

## 2020-06-14 DIAGNOSIS — Z23 Encounter for immunization: Secondary | ICD-10-CM | POA: Diagnosis not present

## 2020-06-14 NOTE — Telephone Encounter (Signed)
Patient was recently seen in the office.  Her left knee joint was giving out and buckling up.  She is having severe pain.  She needs to schedule MRI to rule out internal derangement.

## 2020-06-14 NOTE — Telephone Encounter (Signed)
Patient called stating she has been experiencing shooting pain in her right knee the past couple of days.  Patient is requesting a return call to let her know if Dr. Estanislado Pandy can place an order for an MRI of her right knee.

## 2020-06-15 ENCOUNTER — Telehealth: Payer: Self-pay | Admitting: Rheumatology

## 2020-06-15 ENCOUNTER — Encounter: Payer: Self-pay | Admitting: Rheumatology

## 2020-06-15 NOTE — Telephone Encounter (Signed)
I called patient, MRI of right knee ordered, patient requesting Raliegh Ip for location.

## 2020-06-15 NOTE — Telephone Encounter (Signed)
Patient left a voicemail stating "she is following up from a message she left yesterday about getting an MRI of her right knee."  Patient requested a return call.

## 2020-06-16 ENCOUNTER — Telehealth: Payer: Self-pay | Admitting: Rheumatology

## 2020-06-16 NOTE — Telephone Encounter (Signed)
Patient spoke with April at 3:14 pm today and has been advised to call to schedule MRI at [facility].

## 2020-06-16 NOTE — Telephone Encounter (Signed)
Patient left a voicemail checking the status of her right knee MRI.  Patient states she would like to schedule it ASAP.  Please advise.

## 2020-06-17 DIAGNOSIS — M25562 Pain in left knee: Secondary | ICD-10-CM | POA: Diagnosis not present

## 2020-06-17 DIAGNOSIS — M25561 Pain in right knee: Secondary | ICD-10-CM | POA: Diagnosis not present

## 2020-06-20 ENCOUNTER — Other Ambulatory Visit: Payer: Self-pay

## 2020-06-20 ENCOUNTER — Ambulatory Visit (INDEPENDENT_AMBULATORY_CARE_PROVIDER_SITE_OTHER): Payer: Medicare Other | Admitting: Physician Assistant

## 2020-06-20 ENCOUNTER — Ambulatory Visit
Admission: RE | Admit: 2020-06-20 | Discharge: 2020-06-20 | Disposition: A | Discharge status: Home / Self Care | Payer: Medicare Other | Source: Ambulatory Visit | Attending: Family Medicine | Admitting: Family Medicine

## 2020-06-20 DIAGNOSIS — M170 Bilateral primary osteoarthritis of knee: Secondary | ICD-10-CM

## 2020-06-20 DIAGNOSIS — Z1231 Encounter for screening mammogram for malignant neoplasm of breast: Secondary | ICD-10-CM

## 2020-06-20 MED ORDER — LIDOCAINE HCL 1 % IJ SOLN
1.5000 mL | INTRAMUSCULAR | Status: AC | PRN
  Administered 2020-06-20: 1.5 mL

## 2020-06-20 MED ORDER — SODIUM HYALURONATE (VISCOSUP) 20 MG/2ML IX SOSY
20.0000 mg | PREFILLED_SYRINGE | INTRA_ARTICULAR | Status: AC | PRN
  Administered 2020-06-20: 20 mg via INTRA_ARTICULAR

## 2020-06-20 NOTE — Progress Notes (Signed)
   Procedure Note  Patient: [name]             Date of Birth: 11-Mar-1946           MRN: [id number]             Visit Date: 06/20/2020  Procedures: Visit Diagnoses:  1. Primary osteoarthritis of both knees     Euflexxa #3 bilateral knees, B/B Large Joint Inj: bilateral knee on 06/20/2020 9:53 AM Indications: pain Details: 27 G 1.5 in needle, medial approach  Arthrogram: No  Medications (Right): 1.5 mL lidocaine 1 %; 20 mg Sodium Hyaluronate 20 MG/2ML Aspirate (Right): 0 mL Medications (Left): 1.5 mL lidocaine 1 %; 20 mg Sodium Hyaluronate 20 MG/2ML Aspirate (Left): 0 mL Outcome: tolerated well, no immediate complications Procedure, treatment alternatives, risks and benefits explained, specific risks discussed. Consent was given by the patient. Immediately prior to procedure a time out was called to verify the correct patient, procedure, equipment, support staff and site/side marked as required. Patient was prepped and draped in the usual sterile fashion.     Patient tolerated the procedure well.  Aftercare was discussed.   Hazel Sams, PA-C

## 2020-06-21 ENCOUNTER — Telehealth: Payer: Self-pay | Admitting: Rheumatology

## 2020-06-21 NOTE — Telephone Encounter (Signed)
Patient left a voicemail checking if Dr. Estanislado Pandy received the report from her MRI of her right knee she had on Friday 06/17/20.  Patient requested a return call at [phone] or [phone]

## 2020-06-22 DIAGNOSIS — D259 Leiomyoma of uterus, unspecified: Secondary | ICD-10-CM | POA: Diagnosis not present

## 2020-06-22 DIAGNOSIS — Z79818 Long term (current) use of other agents affecting estrogen receptors and estrogen levels: Secondary | ICD-10-CM | POA: Diagnosis not present

## 2020-06-22 DIAGNOSIS — N898 Other specified noninflammatory disorders of vagina: Secondary | ICD-10-CM | POA: Diagnosis not present

## 2020-06-22 DIAGNOSIS — N854 Malposition of uterus: Secondary | ICD-10-CM | POA: Diagnosis not present

## 2020-06-22 DIAGNOSIS — M25562 Pain in left knee: Secondary | ICD-10-CM | POA: Diagnosis not present

## 2020-06-22 NOTE — Telephone Encounter (Signed)
I reviewed the MRI from June 17, 2020 which showed extensive tearing of lateral meniscus.  Moderate to severe osteoarthritis of lateral compartment.  I left a message on the answering machine on both phone numbers for her to call back

## 2020-06-23 DIAGNOSIS — N898 Other specified noninflammatory disorders of vagina: Secondary | ICD-10-CM | POA: Diagnosis not present

## 2020-06-23 NOTE — Telephone Encounter (Signed)
Patient advised Dr. Estanislado Pandy reviewed the MRI from June 17, 2020 which showed extensive tearing of lateral meniscus.  Moderate to severe osteoarthritis of lateral compartment. Patient states she has already seen orthopedics and received their recommendations.

## 2020-07-01 ENCOUNTER — Ambulatory Visit: Payer: Medicare Other | Admitting: Rheumatology

## 2020-07-13 ENCOUNTER — Ambulatory Visit
Admission: RE | Admit: 2020-07-13 | Discharge: 2020-07-13 | Disposition: A | Discharge status: Home / Self Care | Payer: Medicare Other | Source: Ambulatory Visit | Attending: Family Medicine | Admitting: Family Medicine

## 2020-07-13 ENCOUNTER — Other Ambulatory Visit: Payer: Self-pay

## 2020-07-13 DIAGNOSIS — Z1231 Encounter for screening mammogram for malignant neoplasm of breast: Secondary | ICD-10-CM | POA: Diagnosis not present

## 2020-07-19 DIAGNOSIS — M25561 Pain in right knee: Secondary | ICD-10-CM | POA: Diagnosis not present

## 2020-07-19 DIAGNOSIS — M1990 Unspecified osteoarthritis, unspecified site: Secondary | ICD-10-CM | POA: Diagnosis not present

## 2020-07-19 DIAGNOSIS — M25562 Pain in left knee: Secondary | ICD-10-CM | POA: Diagnosis not present

## 2020-07-19 DIAGNOSIS — E039 Hypothyroidism, unspecified: Secondary | ICD-10-CM | POA: Diagnosis not present

## 2020-07-19 DIAGNOSIS — F3342 Major depressive disorder, recurrent, in full remission: Secondary | ICD-10-CM | POA: Diagnosis not present

## 2020-07-19 DIAGNOSIS — E7800 Pure hypercholesterolemia, unspecified: Secondary | ICD-10-CM | POA: Diagnosis not present

## 2020-07-21 ENCOUNTER — Encounter: Payer: Self-pay | Admitting: Rheumatology

## 2020-07-22 ENCOUNTER — Telehealth: Payer: Self-pay | Admitting: *Deleted

## 2020-07-22 DIAGNOSIS — Z23 Encounter for immunization: Secondary | ICD-10-CM | POA: Diagnosis not present

## 2020-07-22 MED ORDER — HYDROXYCHLOROQUINE SULFATE 200 MG PO TABS
200.0000 mg | ORAL_TABLET | Freq: Every day | ORAL | 0 refills | Status: DC
Start: 2020-07-22 — End: 2021-03-13

## 2020-07-22 MED ORDER — ESCITALOPRAM OXALATE 10 MG PO TABS
10.0000 mg | ORAL_TABLET | Freq: Every day | ORAL | 0 refills | Status: DC
Start: 2020-07-22 — End: 2020-11-15

## 2020-07-22 NOTE — Telephone Encounter (Signed)
Last Visit: 06/01/2020 Next Visit: 11/02/2020 Labs: 05/17/2020 Lymphocyte count is decreased but stable. GFR is lower. PLQ eye exam: 09/10/2019 Normal.  Current Dose per lab note on 05/17/2020: decrease Plaquenil to 1 tablet p.o. daily instead of 1 tablet p.o. twice daily Monday to Friday  Okay to refill per Dr. Estanislado Pandy

## 2020-07-22 NOTE — Telephone Encounter (Signed)
Last Visit: 06/01/2020 Next Visit: 11/02/2020  Okay to refill per Dr. Deveshwar  

## 2020-07-23 ENCOUNTER — Encounter: Payer: Self-pay | Admitting: Rheumatology

## 2020-08-23 ENCOUNTER — Other Ambulatory Visit: Payer: Self-pay | Admitting: *Deleted

## 2020-08-23 DIAGNOSIS — M359 Systemic involvement of connective tissue, unspecified: Secondary | ICD-10-CM

## 2020-08-23 DIAGNOSIS — M3501 Sicca syndrome with keratoconjunctivitis: Secondary | ICD-10-CM | POA: Diagnosis not present

## 2020-08-23 DIAGNOSIS — Z79899 Other long term (current) drug therapy: Secondary | ICD-10-CM

## 2020-08-25 LAB — URINALYSIS, ROUTINE W REFLEX MICROSCOPIC
Bacteria, UA: NONE SEEN /HPF
Bilirubin Urine: NEGATIVE
Glucose, UA: NEGATIVE
Hgb urine dipstick: NEGATIVE
Hyaline Cast: NONE SEEN /LPF
Ketones, ur: NEGATIVE
Nitrite: NEGATIVE
Protein, ur: NEGATIVE
RBC / HPF: NONE SEEN /HPF (ref 0–2)
Specific Gravity, Urine: 1.003 (ref 1.001–1.03)
Squamous Epithelial / HPF: NONE SEEN /HPF (ref <=5)
pH: 6.5 (ref 5.0–8.0)

## 2020-08-25 LAB — COMPLETE METABOLIC PANEL WITH GFR
AG Ratio: 1.8 (calc) (ref 1.0–2.5)
ALT: 13 U/L (ref 6–29)
AST: 18 U/L (ref 10–35)
Albumin: 4.5 g/dL (ref 3.6–5.1)
Alkaline phosphatase (APISO): 49 U/L (ref 37–153)
BUN: 13 mg/dL (ref 7–25)
CO2: 29 mmol/L (ref 20–32)
Calcium: 10 mg/dL (ref 8.6–10.4)
Chloride: 105 mmol/L (ref 98–110)
Creat: 0.75 mg/dL (ref 0.60–0.93)
GFR, Est African American: 91 mL/min/{1.73_m2} (ref >=60)
GFR, Est Non African American: 79 mL/min/{1.73_m2} (ref >=60)
Globulin: 2.5 g/dL (calc) (ref 1.9–3.7)
Glucose, Bld: 97 mg/dL (ref 65–99)
Potassium: 4.6 mmol/L (ref 3.5–5.3)
Sodium: 140 mmol/L (ref 135–146)
Total Bilirubin: 0.6 mg/dL (ref 0.2–1.2)
Total Protein: 7 g/dL (ref 6.1–8.1)

## 2020-08-25 LAB — CBC WITH DIFFERENTIAL/PLATELET
Absolute Monocytes: 583 cells/uL (ref 200–950)
Basophils Absolute: 74 cells/uL (ref 0–200)
Basophils Relative: 1.1 %
Eosinophils Absolute: 214 cells/uL (ref 15–500)
Eosinophils Relative: 3.2 %
HCT: 42.6 % (ref 35.0–45.0)
Hemoglobin: 14.6 g/dL (ref 11.7–15.5)
Lymphs Abs: 1045 cells/uL (ref 850–3900)
MCH: 34 pg — ABNORMAL HIGH (ref 27.0–33.0)
MCHC: 34.3 g/dL (ref 32.0–36.0)
MCV: 99.3 fL (ref 80.0–100.0)
MPV: 10.5 fL (ref 7.5–12.5)
Monocytes Relative: 8.7 %
Neutro Abs: 4784 cells/uL (ref 1500–7800)
Neutrophils Relative %: 71.4 %
Platelets: 274 10*3/uL (ref 140–400)
RBC: 4.29 10*6/uL (ref 3.80–5.10)
RDW: 11.8 % (ref 11.0–15.0)
Total Lymphocyte: 15.6 %
WBC: 6.7 10*3/uL (ref 3.8–10.8)

## 2020-08-25 LAB — SEDIMENTATION RATE: Sed Rate: 9 mm/h (ref 0–30)

## 2020-08-25 LAB — SJOGRENS SYNDROME-B EXTRACTABLE NUCLEAR ANTIBODY: SSB (La) (ENA) Antibody, IgG: <1 AI

## 2020-08-25 LAB — SJOGRENS SYNDROME-A EXTRACTABLE NUCLEAR ANTIBODY: SSA (Ro) (ENA) Antibody, IgG: >8 AI — AB

## 2020-08-25 LAB — C3 AND C4
C3 Complement: 126 mg/dL (ref 83–193)
C4 Complement: 26 mg/dL (ref 15–57)

## 2020-08-25 LAB — PROTEIN ELECTROPHORESIS, SERUM, WITH REFLEX
Albumin ELP: 4.2 g/dL (ref 3.8–4.8)
Alpha 1: 0.3 g/dL (ref 0.2–0.3)
Alpha 2: 0.7 g/dL (ref 0.5–0.9)
Beta 2: 0.4 g/dL (ref 0.2–0.5)
Beta Globulin: 0.4 g/dL (ref 0.4–0.6)
Gamma Globulin: 1 g/dL (ref 0.8–1.7)
Total Protein: 7 g/dL (ref 6.1–8.1)

## 2020-08-25 LAB — ANTI-DNA ANTIBODY, DOUBLE-STRANDED: ds DNA Ab: 1 IU/mL

## 2020-08-25 LAB — ANTI-NUCLEAR AB-TITER (ANA TITER): ANA Titer 1: 1:80 {titer} — ABNORMAL HIGH

## 2020-08-25 LAB — ANA: Anti Nuclear Antibody (ANA): POSITIVE — AB

## 2020-08-26 NOTE — Progress Notes (Signed)
Labs are consistent with Sjogren's disease.  Labs do not indicate an active disease.

## 2020-09-09 DIAGNOSIS — Z79899 Other long term (current) drug therapy: Secondary | ICD-10-CM | POA: Diagnosis not present

## 2020-09-09 DIAGNOSIS — M3210 Systemic lupus erythematosus, organ or system involvement unspecified: Secondary | ICD-10-CM | POA: Diagnosis not present

## 2020-09-09 DIAGNOSIS — Z961 Presence of intraocular lens: Secondary | ICD-10-CM | POA: Diagnosis not present

## 2020-09-09 DIAGNOSIS — H524 Presbyopia: Secondary | ICD-10-CM | POA: Diagnosis not present

## 2020-09-13 DIAGNOSIS — E7800 Pure hypercholesterolemia, unspecified: Secondary | ICD-10-CM | POA: Diagnosis not present

## 2020-09-13 DIAGNOSIS — F3342 Major depressive disorder, recurrent, in full remission: Secondary | ICD-10-CM | POA: Diagnosis not present

## 2020-09-13 DIAGNOSIS — M1990 Unspecified osteoarthritis, unspecified site: Secondary | ICD-10-CM | POA: Diagnosis not present

## 2020-09-13 DIAGNOSIS — E039 Hypothyroidism, unspecified: Secondary | ICD-10-CM | POA: Diagnosis not present

## 2020-09-30 DIAGNOSIS — Z20822 Contact with and (suspected) exposure to covid-19: Secondary | ICD-10-CM | POA: Diagnosis not present

## 2020-10-04 DIAGNOSIS — E039 Hypothyroidism, unspecified: Secondary | ICD-10-CM | POA: Diagnosis not present

## 2020-10-04 DIAGNOSIS — E7800 Pure hypercholesterolemia, unspecified: Secondary | ICD-10-CM | POA: Diagnosis not present

## 2020-10-04 DIAGNOSIS — F3342 Major depressive disorder, recurrent, in full remission: Secondary | ICD-10-CM | POA: Diagnosis not present

## 2020-10-04 DIAGNOSIS — M1990 Unspecified osteoarthritis, unspecified site: Secondary | ICD-10-CM | POA: Diagnosis not present

## 2020-10-06 DIAGNOSIS — E039 Hypothyroidism, unspecified: Secondary | ICD-10-CM | POA: Diagnosis not present

## 2020-10-06 DIAGNOSIS — F3342 Major depressive disorder, recurrent, in full remission: Secondary | ICD-10-CM | POA: Diagnosis not present

## 2020-10-06 DIAGNOSIS — N390 Urinary tract infection, site not specified: Secondary | ICD-10-CM | POA: Diagnosis not present

## 2020-10-06 DIAGNOSIS — E559 Vitamin D deficiency, unspecified: Secondary | ICD-10-CM | POA: Diagnosis not present

## 2020-10-06 DIAGNOSIS — E7800 Pure hypercholesterolemia, unspecified: Secondary | ICD-10-CM | POA: Diagnosis not present

## 2020-10-06 DIAGNOSIS — M329 Systemic lupus erythematosus, unspecified: Secondary | ICD-10-CM | POA: Diagnosis not present

## 2020-10-06 DIAGNOSIS — M85859 Other specified disorders of bone density and structure, unspecified thigh: Secondary | ICD-10-CM | POA: Diagnosis not present

## 2020-10-06 DIAGNOSIS — M1990 Unspecified osteoarthritis, unspecified site: Secondary | ICD-10-CM | POA: Diagnosis not present

## 2020-10-06 DIAGNOSIS — I7300 Raynaud's syndrome without gangrene: Secondary | ICD-10-CM | POA: Diagnosis not present

## 2020-10-06 DIAGNOSIS — N952 Postmenopausal atrophic vaginitis: Secondary | ICD-10-CM | POA: Diagnosis not present

## 2020-10-06 DIAGNOSIS — M359 Systemic involvement of connective tissue, unspecified: Secondary | ICD-10-CM | POA: Diagnosis not present

## 2020-10-17 ENCOUNTER — Encounter: Payer: Self-pay | Admitting: Rheumatology

## 2020-10-18 NOTE — Progress Notes (Deleted)
Office Visit Note  Patient: [name]             Date of Birth: 22-Oct-1946           MRN: [id number]             PCP: Cari Caraway, MD Referring: Cari Caraway, MD Visit Date: 10/19/2020 Occupation: @GUAROCC @  Subjective:  No chief complaint on file.   History of Present Illness: [name] is a 74 y.o. female ***   Activities of Daily Living:  Patient reports morning stiffness for *** {minute/hour:19697}.   Patient {ACTIONS;DENIES/REPORTS:21021675::"Denies"} nocturnal pain.  Difficulty dressing/grooming: {ACTIONS;DENIES/REPORTS:21021675::"Denies"} Difficulty climbing stairs: {ACTIONS;DENIES/REPORTS:21021675::"Denies"} Difficulty getting out of chair: {ACTIONS;DENIES/REPORTS:21021675::"Denies"} Difficulty using hands for taps, buttons, cutlery, and/or writing: {ACTIONS;DENIES/REPORTS:21021675::"Denies"}  No Rheumatology ROS completed.   PMFS History:  Patient Active Problem List   Diagnosis Date Noted  . Encounter for counseling 09/09/2018  . Osteopenia of multiple sites 08/22/2017  . History of vitamin D deficiency 08/22/2017  . Autoimmune disease ([place]) 10/23/2016  . Sjogren's syndrome ([place]) 10/09/2016  . Primary osteoarthritis of both hands 10/09/2016  . Primary osteoarthritis of both knees 10/09/2016  . High risk medication use 09/12/2016  . SBO (small bowel obstruction), partial postop 10/16/2011  . 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 Allergy   . Colon polyp   . Environmental allergies   . Hypothyroid   . Osteoarthritis   . Osteoarthritis   . Osteoarthritis   . Osteopenia   . Plantar fasciitis, right   . Raynauds phenomenon   . 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 Sjogren's syndrome ([place])   . Systemic lupus (HCC)     Family History  Problem Relation Age of Onset  . Heart disease Mother   . Psoriasis Father   . Arthritis  Father   . Heart disease Brother   . Heart attack Brother   . Diabetes Maternal Grandfather   . Cancer Maternal Uncle        colon  . Colon cancer Maternal Uncle 32  . Asthma Sister   . Breast cancer Neg Hx    Past Surgical History:  Procedure Laterality Date  . APPENDECTOMY    . BUNIONECTOMY  2008   right foot  . CATARACT EXTRACTION    . COLONOSCOPY W/ POLYPECTOMY  Aug 27, 2011  . ENDOMETRIAL ABLATION    . FOOT SURGERY Right 12/17/2017   bone spur  . ingrown toenail Right 12/16/2018  . LAPAROSCOPIC APPENDECTOMY  10/11/2011   Procedure: APPENDECTOMY LAPAROSCOPIC;  Surgeon: Adin Hector, MD;  Location: [facility];  Service: General;  Laterality: N/A;  Partial Cecectomy and Appendectomy  . WISDOM TOOTH EXTRACTION     Social History   Social History Narrative  . Not on file   Immunization History  Administered Date(s) Administered  . Influenza, High Dose Seasonal PF 07/05/2018, 06/18/2019  . Influenza,inj,quad, With Preservative 07/10/2017  . Influenza-Unspecified 07/23/2020  . PFIZER SARS-COV-2 Vaccination 11/16/2019, 12/07/2019, 06/14/2020  . Pneumococcal Conjugate-13 09/08/2014  . Pneumococcal Polysaccharide-23 08/30/2011  . Tdap 06/20/2011  . Zoster 10/11/2007     Objective: Vital Signs: There were no vitals taken for this visit.   Physical Exam   Musculoskeletal Exam: ***  CDAI Exam: CDAI Score: - Patient Global: -; Provider Global: - Swollen: -; Tender: - Joint Exam 10/19/2020   No joint exam has been documented for this visit  There is currently no information documented on the homunculus. Go to the Rheumatology activity and complete the homunculus joint exam.  Investigation: No additional findings.  Imaging: No results found.  Recent Labs: Lab Results  Component Value Date   WBC 6.7 08/23/2020   HGB 14.6 08/23/2020   PLT 274 08/23/2020   NA 140 08/23/2020   K 4.6 08/23/2020   CL 105 08/23/2020   CO2 29 08/23/2020   GLUCOSE 97 08/23/2020    BUN 13 08/23/2020   CREATININE 0.75 08/23/2020   BILITOT 0.6 08/23/2020   ALKPHOS 48 02/11/2017   AST 18 08/23/2020   ALT 13 08/23/2020   PROT 7.0 08/23/2020   PROT 7.0 08/23/2020   ALBUMIN 3.9 02/11/2017   CALCIUM 10.0 08/23/2020   GFRAA 91 08/23/2020    Speciality Comments: PLQ eye exam: 09/10/2019 Normal. Dr. Jethro Bolus. Follow up in 1 year.  Procedures:  No procedures performed Allergies: Latex and Sulfonamide derivatives   Assessment / Plan:     Visit Diagnoses: No diagnosis found.  Orders: No orders of the defined types were placed in this encounter.  No orders of the defined types were placed in this encounter.   Face-to-face time spent with patient was *** minutes. Greater than 50% of time was spent in counseling and coordination of care.  Follow-Up Instructions: No follow-ups on file.   Ellen Henri, CMA  Note - This record has been created using Animal nutritionist.  Chart creation errors have been sought, but may not always  have been located. Such creation errors do not reflect on  the standard of medical care.

## 2020-10-19 ENCOUNTER — Ambulatory Visit: Payer: Medicare Other | Admitting: Physician Assistant

## 2020-10-19 NOTE — Progress Notes (Signed)
Office Visit Note  Patient: [name]             Date of Birth: [birth year]           MRN: [id number]             PCP: Cari Caraway, MD Referring: Cari Caraway, MD Visit Date: 10/20/2020 Occupation: @GUAROCC @  Subjective:  Pain in left knee and left trochanteric bursitis    History of Present Illness: [name] is a 74 y.o. female with history of autoimmune disease, sjogren's, and osteoarthritis.  She is taking plaquenil 200 mg 1 tablet by mouth daily and has not missed any doses. She continues to take aspirin 81 mg daily.  She has been taking tumeric and fish oil for the natural antiinflammatory properties.  She is been experiencing increased discomfort in both hands, both knees, and the left trochanteric bursa.  She has noticed increased thickening and deformity in her hands which has started to concern her.  She has tried wearing arthritis compression gloves on occasion.  Patient reports that she would like to reapply for Visco gel injections for both knee joints.  She would also like to try a cortisone injection in her left knee joint today.  She also requested a cortisone injection in the left trochanteric bursa due to ongoing discomfort especially when laying on her side at night. She continues to have ongoing sicca symptoms.  She uses lubricating drops and biotene products for symptomatic relief.  She denies any oral or nasal ulcers.  She has occasional symptoms of raynaud's.   PCP manages osteoporosis.  She continues to take [facility] as prescribed.  She is also taking calcium vitamin D supplement as recommended.  She denies any recent falls or fractures. She denies any recent infections.  She has received all 3 COVID-19 vaccinations.    Activities of Daily Living:  Patient reports morning stiffness for 10  minutes.    Patient Reports nocturnal pain.  Difficulty dressing/grooming: Denies Difficulty climbing stairs: Denies Difficulty getting out of chair:  Denies Difficulty using hands for taps, buttons, cutlery, and/or writing: Reports  Review of Systems  Constitutional: Negative for fatigue.  HENT: Positive for mouth dryness. Negative for mouth sores and nose dryness.   Eyes: Positive for dryness. Negative for pain and itching.  Respiratory: Negative for shortness of breath and difficulty breathing.   Cardiovascular: Negative for chest pain and palpitations.  Gastrointestinal: Negative for blood in stool, constipation and diarrhea.  Endocrine: Negative for increased urination.  Genitourinary: Negative for difficulty urinating.  Musculoskeletal: Positive for arthralgias, joint pain, joint swelling, myalgias, morning stiffness, muscle tenderness and myalgias.  Skin: Negative for color change, rash and redness.  Allergic/Immunologic: Negative for susceptible to infections.  Neurological: Negative for dizziness, numbness, headaches, memory loss and weakness.  Hematological: Positive for bruising/bleeding tendency.  Psychiatric/Behavioral: Negative for confusion and sleep disturbance.    PMFS History:  Patient Active Problem List   Diagnosis Date Noted  . Encounter for counseling 09/09/2018  . Osteopenia of multiple sites 08/22/2017  . History of vitamin D deficiency 08/22/2017  . Autoimmune disease ([place]) 10/23/2016  . Sjogren's syndrome ([place]) 10/09/2016  . Primary osteoarthritis of both hands 10/09/2016  . Primary osteoarthritis of both knees 10/09/2016  . High risk medication use 09/12/2016  . SBO (small bowel obstruction), partial postop 10/16/2011  . 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  Allergy   . Colon polyp   . Environmental allergies   . Hypothyroid   . Osteoarthritis   . Osteoarthritis   . Osteoarthritis   . Osteopenia   . Plantar fasciitis, right   . Raynauds phenomenon   . Serrated adenoma of appendiceal oriface  09/10/2011  . Sjogren's syndrome ([place])   . Systemic lupus ([facility])     Family History  Problem Relation Age of Onset  . Heart disease Mother   . Psoriasis Father   . Arthritis Father   . Heart disease Brother   . Heart attack Brother   . Diabetes Maternal Grandfather   . Cancer Maternal Uncle        colon  . Colon cancer Maternal Uncle 44  . Asthma Sister   . Breast cancer Neg Hx    Past Surgical History:  Procedure Laterality Date  . APPENDECTOMY    . BUNIONECTOMY  2008   right foot  . CATARACT EXTRACTION    . COLONOSCOPY W/ POLYPECTOMY  Aug 27, 2011  . ENDOMETRIAL ABLATION    . FOOT SURGERY Right 12/17/2017   bone spur  . ingrown toenail Right 12/16/2018  . LAPAROSCOPIC APPENDECTOMY  10/11/2011   Procedure: APPENDECTOMY LAPAROSCOPIC;  Surgeon: Adin Hector, MD;  Location: [facility];  Service: General;  Laterality: N/A;  Partial Cecectomy and Appendectomy  . WISDOM TOOTH EXTRACTION     Social History   Social History Narrative  . Not on file   Immunization History  Administered Date(s) Administered  . Influenza, High Dose Seasonal PF 07/05/2018, 06/18/2019  . Influenza,inj,quad, With Preservative 07/10/2017  . Influenza-Unspecified 07/23/2020  . PFIZER SARS-COV-2 Vaccination 11/16/2019, 12/07/2019, 06/14/2020  . Pneumococcal Conjugate-13 09/08/2014  . Pneumococcal Polysaccharide-23 08/30/2011  . Tdap 06/20/2011  . Zoster 10/11/2007     Objective: Vital Signs: BP 128/74 (BP Location: Left Arm, Patient Position: Sitting, Cuff Size: Normal)   Pulse 67   Resp 14   Ht 5' 7"  (1.702 m)   Wt 157 lb 6.4 oz (71.4 kg)   BMI 24.65 kg/m    Physical Exam Vitals and nursing note reviewed.  Constitutional:      Appearance: She is well-developed and well-nourished.  HENT:     Head: Normocephalic and atraumatic.  Eyes:     Extraocular Movements: EOM normal.     Conjunctiva/sclera: Conjunctivae normal.  Cardiovascular:     Pulses: Intact distal pulses.  Pulmonary:      Effort: Pulmonary effort is normal.  Abdominal:     Palpations: Abdomen is soft.  Musculoskeletal:     Cervical back: Normal range of motion.  Skin:    General: Skin is warm and dry.     Capillary Refill: Capillary refill takes less than 2 seconds.  Neurological:     Mental Status: She is alert and oriented to person, place, and time.  Psychiatric:        Mood and Affect: Mood and affect normal.        Behavior: Behavior normal.      Musculoskeletal Exam: C-spine, thoracic spine, and lumbar spine good ROM.  Shoulder joints, elbow joints, wrist joints, MCPs, PIPs, and DIPs good ROM with no synovitis.  PIP and DIP thickening consistent with osteoarthritis of both hands.  CMC joint prominence bilaterally, L>R.  Hip joints good ROM with no discomfort.  Tenderness over the left trochanteric bursa.  Knee joints good ROM bilaterally.  Bilateral knee crepitus.  Ankle joints good ROM with no tenderness or inflammation.  No tenderness of MTPs.   CDAI Exam: CDAI Score: -- Patient Global: --; Provider Global: -- Swollen: --; Tender: -- Joint Exam 10/20/2020   No joint exam has been documented for this visit   There is currently no information documented on the homunculus. Go to the Rheumatology activity and complete the homunculus joint exam.  Investigation: No additional findings.  Imaging: No results found.  Recent Labs: Lab Results  Component Value Date   WBC 6.7 08/23/2020   HGB 14.6 08/23/2020   PLT 274 08/23/2020   NA 140 08/23/2020   K 4.6 08/23/2020   CL 105 08/23/2020   CO2 29 08/23/2020   GLUCOSE 97 08/23/2020   BUN 13 08/23/2020   CREATININE 0.75 08/23/2020   BILITOT 0.6 08/23/2020   ALKPHOS 48 02/11/2017   AST 18 08/23/2020   ALT 13 08/23/2020   PROT 7.0 08/23/2020   PROT 7.0 08/23/2020   ALBUMIN 3.9 02/11/2017   CALCIUM 10.0 08/23/2020   GFRAA 91 08/23/2020    Speciality Comments: PLQ eye exam: 09/09/2020 Normal. Dr. Rutherford Guys. Follow up in 1 year. (in  [place])  Procedures:  Large Joint Inj: L knee on 10/20/2020 9:14 AM Indications: pain Details: 27 G 1.5 in needle, medial approach  Arthrogram: No  Medications: 1.5 mL lidocaine 1 %; 40 mg triamcinolone acetonide 40 MG/ML Aspirate: 0 mL Outcome: tolerated well, no immediate complications Procedure, treatment alternatives, risks and benefits explained, specific risks discussed. Consent was given by the patient. Immediately prior to procedure a time out was called to verify the correct patient, procedure, equipment, support staff and site/side marked as required. Patient was prepped and draped in the usual sterile fashion.   Large Joint Inj: L greater trochanter on 10/20/2020 9:14 AM Indications: pain Details: 27 G 1.5 in needle, lateral approach  Arthrogram: No  Medications: 40 mg triamcinolone acetonide 40 MG/ML; 1.5 mL lidocaine 1 % Aspirate: 0 mL Outcome: tolerated well, no immediate complications Procedure, treatment alternatives, risks and benefits explained, specific risks discussed. Consent was given by the patient. Immediately prior to procedure a time out was called to verify the correct patient, procedure, equipment, support staff and site/side marked as required. Patient was prepped and draped in the usual sterile fashion.     Allergies: Latex and Sulfonamide derivatives       Assessment / Plan:     Visit Diagnoses: Autoimmune disease ([place]) - ANA and Raynaud's phenomena and arthritis: She has not had any signs or symptoms of a systemic autoimmune disease flare.  She is clinically doing well on Plaquenil 200 mg 1 tablet by mouth daily.  She is tolerating Plaquenil without any side effects and has not missed any doses recently.  Lab work from 08/23/20 was reviewed with the patient today in the office: ANA remains positive and Ro antibody positive, ESR WNL, complements WNL, dsDNA negative, SPEP WNL.  She continues to have ongoing sicca symptoms and uses lubricating  eyedrops and Biotene products for symptomatic relief.  She continues to see her ophthalmologist and dentist as recommended.  She has not had any recent dental caries.  She has not had any recent oral or nasal ulcerations.  No recent rashes or increased photosensitivity.  She tries to avoid direct sun exposure.  She has not had any symptoms of Raynaud's.  No digital stations or signs of gangrene were noted on examination today.  She will continue taking aspirin 81 mg 1 tablet by mouth daily.  She has been experiencing increased pain in  both hands and both knee joints.  No synovitis or knee joint effusions were noted on examination today.  She has PIP and DIP thickening consistent with osteoarthritis of both hands.  Prominence of bilateral CMC joints especially the left CMC joint noted.  X-rays of both hands were updated today.  We discussed the use of natural anti-inflammatories, arthritis compression gloves, CMC joint braces, and hand therapy.  She declined a referral to hand therapy at this time.  She would like to discuss the increased discomfort and deformity in both hands with a hand surgeon.  She plans on scheduling appointment with Dr. Amedeo Plenty for further evaluation.  She will continue taking Plaquenil as prescribed.  She was advised to notify us if she develops any new or worsening symptoms.  She will follow-up in the office in 5 months.  High risk medication use - Plaquenil 200 mg 1 tablet by mouth daily. CBC and CMP updated on 08/23/20.  She will be due to update lab work in April and every 5 months to monitor for drug toxicity. PLQ eye exam: 09/09/2020 Normal. Dr. Rutherford Guys. Follow up in 1 year. (in [place]).  CBC and CMP were updated on 08/23/2020.  Results were reviewed with the patient today in the office.  She will continue to require lab work every 5 months to monitor for drug toxicity.  She has not had any recent infections.  She has received all 3 COVID-19 vaccinations.  Sjogren's  syndrome with keratoconjunctivitis sicca ([place]): ANA+, Ro+: She has ongoing sicca symptoms.  She uses lubricating eyedrops at night which alleviated some of her eye dryness.  She is also been using Biotene products for symptomatic relief.  She continues to follow-up with her ophthalmologist and dentist as recommended.  She has not had any recent dental caries or salivary stones.  Lab work from 11-21 was reviewed with the patient today in the office.  ANA and Ro antibody remain positive.  SPEP and sed rate were within normal limits.  She will continue taking Plaquenil as prescribed.  She was advised to notify us if she develops any new or worsening symptoms.  Primary osteoarthritis of both knees - s/p euflexxa bilateral knees 05/2020.  She has ongoing discomfort in both knee joints especially the left knee.  She followed up with Dr. Wynelle Link to discuss meniscal repair in the left knee but does not plan to proceed with surgery at this time.  She is aware that she will likely require knee replacements in the future so she does not want to proceed with arthroscopic surgery at this time.  She continues to have ongoing discomfort in the left knee joint.  Her mechanical symptoms have been infrequent.  She requested a left knee joint cortisone injection today.  She would also like to reapply for Visco gel injections for both knees.  We discussed the use of Voltaren gel which she can apply topically as needed for pain relief.  We also discussed the importance of lower extremity muscle strengthening.  Primary osteoarthritis of both hands - She has been experiencing increased stiffness and thickening in both hands.  She has PIP and DIP thickening consistent with osteoarthritis of both hands.  CMC joint prominence noted bilaterally, left greater than right.  She has not noticed much joint tenderness or inflammation but is concerned by increased thickening and deformity in her hands.  X-rays of both hands were updated today and  were reviewed with the patient today in the office.  A CD with  the images was provided to the patient.  She plans on following up with Dr. Amedeo Plenty to discuss surgical intervention.  Conservative treatment options were discussed today in detail.  Recommended the use of Voltaren gel, arthritis compression gloves, and CMC joint braces.  We also discussed taking natural anti-inflammatories.  She will continue taking turmeric and fish oil and will add in ginger and tart cherry.  We discussed the importance of joint protection and muscle strengthening.  Lateral hand exercises were discussed today in detail.  She will be mailed hand exercises as well.  She declined hand therapy at this time.  Plan: XR Hand 2 View Right, XR Hand 2 View Left  Chronic pain of left knee -She has ongoing left knee joint pain. She requested a left knee joint cortisone injection today.  She tolerated procedure well.  Aftercare was discussed.  Procedure note was completed above.  We will reapply for visco gel injections in both knees.  plan: Large Joint Inj: L knee  Trochanteric bursitis, left hip -She has tenderness to palpation over the left trochanteric bursa.  Her discomfort is exacerbated by laying on her side at night.  she requested a cortisone injection today.  She tolerated the procedure well.  Aftercare was discussed. Plan: Large Joint Inj: L greater trochanter  Osteopenia of multiple sites - DEXA ordered by Dr. Addison Lank. February 02, 2020 showed T score of -2.2 which was a stable.  She is on Boniva once a month. She continues to take calcium and vitamin D supplement daily. She has not had any recent falls or fractures.   History of vitamin D deficiency: She is taking vitamin D 2000 units daily.   Other fatigue: Stable. Discussed the importance of regular exercise.   History of depression: She is taking lexapro as prescribed.   History of anxiety: She is taking lexapro as prescribed.    Orders: Orders Placed This Encounter   Procedures  . Large Joint Inj: L knee  . Large Joint Inj: L greater trochanter  . XR Hand 2 View Right  . XR Hand 2 View Left   No orders of the defined types were placed in this encounter.  Follow-Up Instructions: Return in about 5 months (around 03/20/2021) for Autoimmune Disease, Sjogren's syndrome.   Ofilia Neas, PA-C  Note - This record has been created using Dragon software.  Chart creation errors have been sought, but may not always  have been located. Such creation errors do not reflect on  the standard of medical care.

## 2020-10-20 ENCOUNTER — Other Ambulatory Visit: Payer: Self-pay

## 2020-10-20 ENCOUNTER — Ambulatory Visit (INDEPENDENT_AMBULATORY_CARE_PROVIDER_SITE_OTHER): Payer: Medicare Other | Admitting: Physician Assistant

## 2020-10-20 ENCOUNTER — Telehealth: Payer: Self-pay

## 2020-10-20 ENCOUNTER — Ambulatory Visit: Payer: Self-pay

## 2020-10-20 ENCOUNTER — Encounter: Payer: Self-pay | Admitting: Physician Assistant

## 2020-10-20 VITALS — BP 128/74 | HR 67 | Resp 14 | Ht 67.0 in | Wt 157.4 lb

## 2020-10-20 DIAGNOSIS — M19042 Primary osteoarthritis, left hand: Secondary | ICD-10-CM | POA: Diagnosis not present

## 2020-10-20 DIAGNOSIS — M170 Bilateral primary osteoarthritis of knee: Secondary | ICD-10-CM | POA: Diagnosis not present

## 2020-10-20 DIAGNOSIS — R5383 Other fatigue: Secondary | ICD-10-CM

## 2020-10-20 DIAGNOSIS — M8589 Other specified disorders of bone density and structure, multiple sites: Secondary | ICD-10-CM | POA: Diagnosis not present

## 2020-10-20 DIAGNOSIS — M25562 Pain in left knee: Secondary | ICD-10-CM

## 2020-10-20 DIAGNOSIS — G8929 Other chronic pain: Secondary | ICD-10-CM

## 2020-10-20 DIAGNOSIS — Z79899 Other long term (current) drug therapy: Secondary | ICD-10-CM | POA: Diagnosis not present

## 2020-10-20 DIAGNOSIS — M7062 Trochanteric bursitis, left hip: Secondary | ICD-10-CM

## 2020-10-20 DIAGNOSIS — M359 Systemic involvement of connective tissue, unspecified: Secondary | ICD-10-CM

## 2020-10-20 DIAGNOSIS — M3501 Sicca syndrome with keratoconjunctivitis: Secondary | ICD-10-CM | POA: Diagnosis not present

## 2020-10-20 DIAGNOSIS — M19041 Primary osteoarthritis, right hand: Secondary | ICD-10-CM

## 2020-10-20 DIAGNOSIS — Z8639 Personal history of other endocrine, nutritional and metabolic disease: Secondary | ICD-10-CM | POA: Diagnosis not present

## 2020-10-20 DIAGNOSIS — Z8659 Personal history of other mental and behavioral disorders: Secondary | ICD-10-CM

## 2020-10-20 MED ORDER — TRIAMCINOLONE ACETONIDE 40 MG/ML IJ SUSP
40.0000 mg | INTRAMUSCULAR | Status: AC | PRN
  Administered 2020-10-20: 40 mg via INTRA_ARTICULAR

## 2020-10-20 MED ORDER — LIDOCAINE HCL 1 % IJ SOLN
1.5000 mL | INTRAMUSCULAR | Status: AC | PRN
  Administered 2020-10-20: 1.5 mL

## 2020-10-20 NOTE — Patient Instructions (Addendum)
Standing Labs We placed an order today for your standing lab work.   Please have your standing labs drawn in April and every 5 months   If possible, please have your labs drawn 2 weeks prior to your appointment so that the provider can discuss your results at your appointment.  We have open lab daily Monday through Thursday from 8:30-12:30 PM and 1:30-4:30 PM and Friday from 8:30-12:30 PM and 1:30-4:00 PM at the office of Dr. Pollyann Savoy, [facility].   Please be advised, patients with office appointments requiring lab work will take precedents over walk-in lab work.  If possible, please come for your lab work on Monday and Friday afternoons, as you may experience shorter wait times. The office is located at [address], [address], [place], [place] [address] No appointment is necessary.   Labs are drawn by [facility]. Please bring your co-pay at the time of your lab draw.  You may receive a bill from [facility] for your lab work.  If you wish to have your labs drawn at another location, please call the office 24 hours in advance to send orders.  If you have any questions regarding directions or hours of operation,  please call [phone].   As a reminder, please drink plenty of water prior to coming for your lab work. Thanks!   Hand Exercises Hand exercises can be helpful for almost anyone. These exercises can strengthen the hands, improve flexibility and movement, and increase blood flow to the hands. These results can make work and daily tasks easier. Hand exercises can be especially helpful for people who have joint pain from arthritis or have nerve damage from overuse (carpal tunnel syndrome). These exercises can also help people who have injured a hand. Exercises Most of these hand exercises are gentle stretching and motion exercises. It is usually safe to do them often throughout the day. Warming up your hands before exercise may help to reduce stiffness. You can do this  with gentle massage or by placing your hands in warm water for 10-15 minutes. It is normal to feel some stretching, pulling, tightness, or mild discomfort as you begin new exercises. This will gradually improve. Stop an exercise right away if you feel sudden, severe pain or your pain gets worse. Ask your health care provider which exercises are best for you. Knuckle bend or "claw" fist 1. Stand or sit with your arm, hand, and all five fingers pointed straight up. Make sure to keep your wrist straight during the exercise. 2. Gently bend your fingers down toward your palm until the tips of your fingers are touching the top of your palm. Keep your big knuckle straight and just bend the small knuckles in your fingers. 3. Hold this position for __________ seconds. 4. Straighten (extend) your fingers back to the starting position. Repeat this exercise 5-10 times with each hand. Full finger fist 1. Stand or sit with your arm, hand, and all five fingers pointed straight up. Make sure to keep your wrist straight during the exercise. 2. Gently bend your fingers into your palm until the tips of your fingers are touching the middle of your palm. 3. Hold this position for __________ seconds. 4. Extend your fingers back to the starting position, stretching every joint fully. Repeat this exercise 5-10 times with each hand. Straight fist 1. Stand or sit with your arm, hand, and all five fingers pointed straight up. Make sure to keep your wrist straight during the exercise. 2. Gently bend  your fingers at the big knuckle, where your fingers meet your hand, and the middle knuckle. Keep the knuckle at the tips of your fingers straight and try to touch the bottom of your palm. 3. Hold this position for __________ seconds. 4. Extend your fingers back to the starting position, stretching every joint fully. Repeat this exercise 5-10 times with each hand. Tabletop 1. Stand or sit with your arm, hand, and all five  fingers pointed straight up. Make sure to keep your wrist straight during the exercise. 2. Gently bend your fingers at the big knuckle, where your fingers meet your hand, as far down as you can while keeping the small knuckles in your fingers straight. Think of forming a tabletop with your fingers. 3. Hold this position for __________ seconds. 4. Extend your fingers back to the starting position, stretching every joint fully. Repeat this exercise 5-10 times with each hand. Finger spread 1. Place your hand flat on a table with your palm facing down. Make sure your wrist stays straight as you do this exercise. 2. Spread your fingers and thumb apart from each other as far as you can until you feel a gentle stretch. Hold this position for __________ seconds. 3. Bring your fingers and thumb tight together again. Hold this position for __________ seconds. Repeat this exercise 5-10 times with each hand. Making circles 1. Stand or sit with your arm, hand, and all five fingers pointed straight up. Make sure to keep your wrist straight during the exercise. 2. Make a circle by touching the tip of your thumb to the tip of your index finger. 3. Hold for __________ seconds. Then open your hand wide. 4. Repeat this motion with your thumb and each finger on your hand. Repeat this exercise 5-10 times with each hand. Thumb motion 1. Sit with your forearm resting on a table and your wrist straight. Your thumb should be facing up toward the ceiling. Keep your fingers relaxed as you move your thumb. 2. Lift your thumb up as high as you can toward the ceiling. Hold for __________ seconds. 3. Bend your thumb across your palm as far as you can, reaching the tip of your thumb for the small finger (pinkie) side of your palm. Hold for __________ seconds. Repeat this exercise 5-10 times with each hand. Grip strengthening  1. Hold a stress ball or other soft ball in the middle of your hand. 2. Slowly increase the  pressure, squeezing the ball as much as you can without causing pain. Think of bringing the tips of your fingers into the middle of your palm. All of your finger joints should bend when doing this exercise. 3. Hold your squeeze for __________ seconds, then relax. Repeat this exercise 5-10 times with each hand. Contact a health care provider if:  Your hand pain or discomfort gets much worse when you do an exercise.  Your hand pain or discomfort does not improve within 2 hours after you exercise. If you have any of these problems, stop doing these exercises right away. Do not do them again unless your health care provider says that you can. Get help right away if:  You develop sudden, severe hand pain or swelling. If this happens, stop doing these exercises right away. Do not do them again unless your health care provider says that you can. This information is not intended to replace advice given to you by your health care provider. Make sure you discuss any questions you have with your health  care provider. Document Revised: 01/29/2019 Document Reviewed: 10/09/2018 Elsevier Patient Education  [place].

## 2020-10-20 NOTE — Telephone Encounter (Signed)
Please apply for bilateral knee visco, per Taylor Dale, PA-C. Thanks!  

## 2020-10-24 NOTE — Progress Notes (Signed)
I reviewed x-ray results with the patient.

## 2020-11-02 ENCOUNTER — Ambulatory Visit: Payer: Medicare Other | Admitting: Rheumatology

## 2020-11-15 ENCOUNTER — Encounter: Payer: Self-pay | Admitting: Rheumatology

## 2020-11-15 MED ORDER — ESCITALOPRAM OXALATE 10 MG PO TABS
10.0000 mg | ORAL_TABLET | Freq: Every day | ORAL | 0 refills | Status: DC
Start: 2020-11-15 — End: 2021-02-18

## 2020-11-15 NOTE — Telephone Encounter (Signed)
Last Visit: 10/20/2020 Next Visit:  03/21/2021  Okay to refill per Dr. Estanislado Pandy

## 2020-11-21 ENCOUNTER — Encounter: Payer: Self-pay | Admitting: Rheumatology

## 2020-11-25 NOTE — Telephone Encounter (Signed)
Submitted for VOB 11/25/2020.

## 2020-11-28 NOTE — Telephone Encounter (Addendum)
Disregard this message. Verification was not for current insurance. 12/02/2020.   Please call to schedule Visco knee injections for after 12/07/2020.  Authorized for [employer] series bilateral knees. Buy and Bill. No PA required. Deductible must be met before coverage applies. (as of date $151.00 of $233.00 has been met) Insurance to cover 80% of allowable cost once deductible has been met.

## 2020-12-02 NOTE — Telephone Encounter (Signed)
Please call patient to schedule Visco knee injections.  Authorized for Synvisc series bilateral knees. Buy and Bill. Deductible does not apply.  No PA required. Insurance to cover 100% of allowable cost for injection with a $30.00 copay each visit, and 95% of allowable amount of the medication.

## 2020-12-12 ENCOUNTER — Other Ambulatory Visit: Payer: Self-pay

## 2020-12-12 ENCOUNTER — Ambulatory Visit: Payer: 59 | Admitting: Physician Assistant

## 2020-12-12 DIAGNOSIS — M170 Bilateral primary osteoarthritis of knee: Secondary | ICD-10-CM

## 2020-12-12 MED ORDER — HYLAN G-F 20 16 MG/2ML IX SOSY
16.0000 mg | PREFILLED_SYRINGE | INTRA_ARTICULAR | Status: AC | PRN
  Administered 2020-12-12: 16 mg via INTRA_ARTICULAR

## 2020-12-12 MED ORDER — PREDNISONE 5 MG PO TABS: ORAL_TABLET | ORAL | 0 refills | Status: DC

## 2020-12-12 MED ORDER — LIDOCAINE HCL 1 % IJ SOLN
1.5000 mL | INTRAMUSCULAR | Status: AC | PRN
  Administered 2020-12-12: 1.5 mL

## 2020-12-12 NOTE — Progress Notes (Signed)
   Procedure Note  Patient: [name]             Date of Birth: 17-Sep-1946           MRN: [id number]             Visit Date: 12/12/2020  Procedures: Visit Diagnoses:  1. Primary osteoarthritis of both knees      Synvisc #1 bilateral knees, B/B  Large Joint Inj: bilateral knee on 12/12/2020 9:59 AM Indications: pain Details: 25 G 1.5 in needle, medial approach  Arthrogram: No  Medications (Right): 1.5 mL lidocaine 1 %; 16 mg Hylan 16 MG/2ML Aspirate (Right): 0 mL Medications (Left): 1.5 mL lidocaine 1 %; 16 mg Hylan 16 MG/2ML Aspirate (Left): 0 mL Outcome: tolerated well, no immediate complications Procedure, treatment alternatives, risks and benefits explained, specific risks discussed. Consent was given by the patient. Immediately prior to procedure a time out was called to verify the correct patient, procedure, equipment, support staff and site/side marked as required. Patient was prepped and draped in the usual sterile fashion.     Patient tolerated the procedure well.  Aftercare was discussed.   Patient presents today with pain in multiple joints.  She is currently having pain and stiffness in both knee joints and both hips.  She is also experiencing discomfort due to trochanteric bursitis of both hips and ischial bursitis bilaterally.  She requested a prednisone taper to alleviate her symptoms.  I will send in a prednisone taper starting at 20 mg tapering by 5 mg every 2 days. Advised the patient to take prednisone in the morning with breakfast and was advised to avoid NSAIDs while taking the taper.   Hazel Sams, PA-C

## 2020-12-19 ENCOUNTER — Other Ambulatory Visit: Payer: Self-pay

## 2020-12-19 ENCOUNTER — Ambulatory Visit: Payer: Medicare Other | Admitting: Physician Assistant

## 2020-12-19 DIAGNOSIS — M170 Bilateral primary osteoarthritis of knee: Secondary | ICD-10-CM

## 2020-12-19 MED ORDER — HYLAN G-F 20 16 MG/2ML IX SOSY
16.0000 mg | PREFILLED_SYRINGE | INTRA_ARTICULAR | Status: AC | PRN
  Administered 2020-12-19: 16 mg via INTRA_ARTICULAR

## 2020-12-19 MED ORDER — LIDOCAINE HCL 1 % IJ SOLN
1.5000 mL | INTRAMUSCULAR | Status: AC | PRN
  Administered 2020-12-19: 1.5 mL

## 2020-12-19 NOTE — Progress Notes (Signed)
   Procedure Note  Patient: [name]             Date of Birth: [birth year]           MRN: [id number]             Visit Date: 12/19/2020  Procedures: Visit Diagnoses:  1. Primary osteoarthritis of both knees      Synvisc #2 bilateral knees, B/B  Large Joint Inj: bilateral knee on 12/19/2020 8:28 AM Indications: pain Details: 25 G 1.5 in needle, medial approach  Arthrogram: No  Medications (Right): 1.5 mL lidocaine 1 %; 16 mg Hylan 16 MG/2ML Aspirate (Right): 0 mL Medications (Left): 1.5 mL lidocaine 1 %; 16 mg Hylan 16 MG/2ML Aspirate (Left): 0 mL Outcome: tolerated well, no immediate complications Procedure, treatment alternatives, risks and benefits explained, specific risks discussed. Consent was given by the patient. Immediately prior to procedure a time out was called to verify the correct patient, procedure, equipment, support staff and site/side marked as required. Patient was prepped and draped in the usual sterile fashion.     Patient tolerated the procedure well.  Aftercare was discussed.  Hazel Sams, PA-C

## 2020-12-26 ENCOUNTER — Ambulatory Visit: Payer: Medicare Other | Admitting: Physician Assistant

## 2020-12-26 ENCOUNTER — Other Ambulatory Visit: Payer: Self-pay

## 2020-12-26 DIAGNOSIS — M170 Bilateral primary osteoarthritis of knee: Secondary | ICD-10-CM

## 2020-12-26 MED ORDER — HYLAN G-F 20 16 MG/2ML IX SOSY
16.0000 mg | PREFILLED_SYRINGE | INTRA_ARTICULAR | Status: AC | PRN
  Administered 2020-12-26: 16 mg via INTRA_ARTICULAR

## 2020-12-26 MED ORDER — LIDOCAINE HCL 1 % IJ SOLN
1.5000 mL | INTRAMUSCULAR | Status: AC | PRN
  Administered 2020-12-26: 1.5 mL

## 2020-12-26 NOTE — Progress Notes (Signed)
   Procedure Note  Patient: [name]             Date of Birth: Mar 20, 1946           MRN: [id number]             Visit Date: 12/26/2020  Procedures: Visit Diagnoses:  1. Primary osteoarthritis of both knees      Synvisc #3 bilateral knees, B/B  Large Joint Inj: bilateral knee on 12/26/2020 11:32 AM Indications: pain Details: 25 G 1.5 in needle, medial approach  Arthrogram: No  Medications (Right): 1.5 mL lidocaine 1 %; 16 mg Hylan 16 MG/2ML Aspirate (Right): 0 mL Medications (Left): 1.5 mL lidocaine 1 %; 16 mg Hylan 16 MG/2ML Aspirate (Left): 0 mL Outcome: tolerated well, no immediate complications Procedure, treatment alternatives, risks and benefits explained, specific risks discussed. Consent was given by the patient. Immediately prior to procedure a time out was called to verify the correct patient, procedure, equipment, support staff and site/side marked as required. Patient was prepped and draped in the usual sterile fashion.     Patient tolerated the procedure well.  Aftercare was discussed.  Hazel Sams, PA-C

## 2021-01-13 NOTE — Progress Notes (Signed)
Office Visit Note  Patient: [name]             Date of Birth: 11-14-1945           MRN: [id number]             PCP: Cari Caraway, MD Referring: Cari Caraway, MD Visit Date: 01/18/2021 Occupation: @GUAROCC @  Subjective:  Left trochanteric bursitis   History of Present Illness: [name] is a 75 y.o. female with history of autoimmune disease, sjogren's syndrome, and osteoarthritis.  She is taking plaquenil 200 mg 1 tablet by mouth daily.  She is tolerating Plaquenil without any side effects.  She denies any signs or symptoms of a flare.  She states that overall her sicca symptoms have been tolerable.  She continues to use a lubricating eyedrop twice daily and has been using Biotene mouthwash on a daily basis for symptomatic relief.  She continues to see her ophthalmologist on a yearly basis and her dentist every 4 months.  Her most recent dental appointment was last week and she did not have any dental caries.  She denies any oral or nasal ulcerations. She denies any swollen lymph nodes.  She has not had any recent rashes.  She has intermittent symptoms of raynaud's, which have been infrequent and mild.  She denies any shortness of breath, palpitations, or pleuritic chest pain.  She states that her knee joint pain has improved since undergoing Synvisc injections in both knees in February 2022.  She presents today with ongoing discomfort in the left trochanteric bursa.  She has difficulty walking prolonged distances due to the discomfort.  She would like a cortisone injection today.  She denies any recent falls or fractures.  She stopped taking Boniva on 12/26/2020 due to experiencing bone pain in both knees.  She has continued to take calcium and vitamin D.     Activities of Daily Living:  Patient reports morning stiffness for 5 minutes.   Patient Reports nocturnal pain.  Difficulty dressing/grooming: Denies Difficulty climbing stairs: Denies Difficulty getting out of chair:  Denies Difficulty using hands for taps, buttons, cutlery, and/or writing: Reports  Review of Systems  Constitutional: Negative for fatigue.  HENT: Positive for mouth dryness. Negative for mouth sores and nose dryness.   Eyes: Positive for dryness. Negative for pain, itching and visual disturbance.  Respiratory: Negative for cough, hemoptysis, shortness of breath and difficulty breathing.   Cardiovascular: Negative for chest pain, palpitations and swelling in legs/feet.  Gastrointestinal: Negative for abdominal pain, blood in stool, constipation and diarrhea.  Endocrine: Negative for increased urination.  Genitourinary: Negative for painful urination.  Musculoskeletal: Positive for arthralgias, joint pain and morning stiffness. Negative for joint swelling, myalgias, muscle weakness, muscle tenderness and myalgias.  Skin: Negative for color change, rash and redness.  Allergic/Immunologic: Negative for susceptible to infections.  Neurological: Negative for dizziness, numbness, headaches, memory loss and weakness.  Hematological: Negative for swollen glands.  Psychiatric/Behavioral: Positive for sleep disturbance. Negative for confusion.    PMFS History:  Patient Active Problem List   Diagnosis Date Noted  . Encounter for counseling 09/09/2018  . Osteopenia of multiple sites 08/22/2017  . History of vitamin D deficiency 08/22/2017  . Autoimmune disease ([place]) 10/23/2016  . Sjogren's syndrome ([place]) 10/09/2016  . Primary osteoarthritis of both hands 10/09/2016  . Primary osteoarthritis of both knees 10/09/2016  . High risk medication use 09/12/2016  . SBO (small bowel obstruction), partial postop 10/16/2011  . 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 Allergy   . Colon polyp   . Environmental allergies   . Hypothyroid   . Osteoarthritis   . Osteoarthritis   . Osteoarthritis   .  Osteopenia   . Plantar fasciitis, right   . Raynauds phenomenon   . 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 Sjogren's syndrome ([place])   . Systemic lupus ([facility])     Family History  Problem Relation Age of Onset  . Heart disease Mother   . Psoriasis Father   . Arthritis Father   . Heart disease Brother   . Heart attack Brother   . Diabetes Maternal Grandfather   . Cancer Maternal Uncle        colon  . Colon cancer Maternal Uncle 64  . Asthma Sister   . Breast cancer Neg Hx    Past Surgical History:  Procedure Laterality Date  . APPENDECTOMY    . BUNIONECTOMY  2008   right foot  . CATARACT EXTRACTION    . COLONOSCOPY W/ POLYPECTOMY  Aug 27, 2011  . ENDOMETRIAL ABLATION    . FOOT SURGERY Right 12/17/2017   bone spur  . ingrown toenail Right 12/16/2018  . LAPAROSCOPIC APPENDECTOMY  10/11/2011   Procedure: APPENDECTOMY LAPAROSCOPIC;  Surgeon: Adin Hector, MD;  Location: [facility];  Service: General;  Laterality: N/A;  Partial Cecectomy and Appendectomy  . WISDOM TOOTH EXTRACTION     Social History   Social History Narrative  . Not on file   Immunization History  Administered Date(s) Administered  . Influenza, High Dose Seasonal PF 07/05/2018, 06/18/2019  . Influenza,inj,quad, With Preservative 07/10/2017  . Influenza-Unspecified 07/23/2020  . PFIZER(Purple Top)SARS-COV-2 Vaccination 11/16/2019, 12/07/2019, 06/14/2020  . Pneumococcal Conjugate-13 09/08/2014  . Pneumococcal Polysaccharide-23 08/30/2011  . Tdap 06/20/2011  . Zoster 10/11/2007     Objective: Vital Signs: BP 117/78 (BP Location: Left Arm, Patient Position: Sitting, Cuff Size: Normal)   Pulse (!) 59   Ht 5' 7"  (1.702 m)   Wt 159 lb 6.4 oz (72.3 kg)   BMI 24.97 kg/m    Physical Exam Vitals and nursing note reviewed.  Constitutional:      Appearance: She is well-developed.  HENT:     Head: Normocephalic and atraumatic.  Eyes:     Conjunctiva/sclera: Conjunctivae normal.  Pulmonary:      Effort: Pulmonary effort is normal.  Abdominal:     Palpations: Abdomen is soft.  Musculoskeletal:     Cervical back: Normal range of motion.  Skin:    General: Skin is warm and dry.     Capillary Refill: Capillary refill takes less than 2 seconds.  Neurological:     Mental Status: She is alert and oriented to person, place, and time.  Psychiatric:        Behavior: Behavior normal.      Musculoskeletal Exam: C-spine, thoracic spine, lumbar spine have good range of motion with no discomfort. Shoulder joints, elbow joints, wrist joints, MCPs, PIPs, and DIPs good ROM with no synovitis. PIP and DIP thickening consistent with osteoarthritis of both hands.  [place] joint prominence bilaterally.  Subluxation of both 1st MCP joint. Complete fist formation bilaterally.  Hip joints have good ROM with no discomfort.  Tenderness over the left trochanteric bursa.  Knee joints good ROM with no warmth or effusion.  Bilateral knee crepitus, R>L noted.  Ankle joints good ROM with no  tenderness or swelling.   CDAI Exam: CDAI Score: -- Patient Global: --; Provider Global: -- Swollen: --; Tender: -- Joint Exam 01/18/2021   No joint exam has been documented for this visit   There is currently no information documented on the homunculus. Go to the Rheumatology activity and complete the homunculus joint exam.  Investigation: No additional findings.  Imaging: No results found.  Recent Labs: Lab Results  Component Value Date   WBC 6.7 08/23/2020   HGB 14.6 08/23/2020   PLT 274 08/23/2020   NA 140 08/23/2020   K 4.6 08/23/2020   CL 105 08/23/2020   CO2 29 08/23/2020   GLUCOSE 97 08/23/2020   BUN 13 08/23/2020   CREATININE 0.75 08/23/2020   BILITOT 0.6 08/23/2020   ALKPHOS 48 02/11/2017   AST 18 08/23/2020   ALT 13 08/23/2020   PROT 7.0 08/23/2020   PROT 7.0 08/23/2020   ALBUMIN 3.9 02/11/2017   CALCIUM 10.0 08/23/2020   GFRAA 91 08/23/2020    Speciality Comments: PLQ eye exam:  09/09/2020 Normal. Dr. Rutherford Guys. Follow up in 1 year. (in [place])  Procedures:  Large Joint Inj: L greater trochanter on 01/18/2021 9:26 AM Indications: pain Details: 27 G 1.5 in needle, lateral approach  Arthrogram: No  Medications: 40 mg triamcinolone acetonide 40 MG/ML; 1.5 mL lidocaine 1 % Aspirate: 0 mL Outcome: tolerated well, no immediate complications Procedure, treatment alternatives, risks and benefits explained, specific risks discussed. Consent was given by the patient. Immediately prior to procedure a time out was called to verify the correct patient, procedure, equipment, support staff and site/side marked as required. Patient was prepped and draped in the usual sterile fashion.     Allergies: Latex and Sulfonamide derivatives    Assessment / Plan:     Visit Diagnoses: Autoimmune disease ([place]) - ANA and Raynaud's phenomena and arthritis: She has not had any signs or symptoms of a flare.  She is clinically doing well taking Plaquenil 200 mg 1 tablet by mouth daily.  She continues to tolerate Plaquenil without any side effects and has not missed any doses recently.  She continues to have chronic sicca symptoms which have been tolerable with gel lubricating drops and Biotene mouth rinse.  She has not had any oral or nasal ulcerations.  She has not had any recent rashes.  She has infrequent symptoms of Raynaud's and no signs of sclerodactyly were noted.  She has noticed improvement in her knee joint pain since undergoing Visco injections in February 2022.  She has no synovitis on examination today.  She has not had any shortness of breath, pleuritic chest pain, or palpitations.  Lab work from 08/23/20 was reviewed today in the office: ANA 1:80 NS, Ro+, dsDNA-, ESR WNL, and complements WNL.  She is due to update lab work today.  Orders were released.  She will continue taking the current dose of Plaquenil.  She does not need a refill at this time.  She was advised to notify us  if she develops signs or symptoms of a flare.  She will follow-up in the office in 5 months.  - Plan: CBC with Differential/Platelet, COMPLETE METABOLIC PANEL WITH GFR, Anti-DNA antibody, double-stranded, C3 and C4, Sedimentation rate, Urinalysis, Routine w reflex microscopic  High risk medication use - Plaquenil 200 mg 1 tablet by mouth daily.  CBC and CMP were updated on 08/23/2020. CBC and CMP due today.  Orders released. PLQ eye exam: 09/09/2020 Normal. Dr. Rutherford Guys. Follow up in 1  year. (in [place]) - Plan: CBC with Differential/Platelet, COMPLETE METABOLIC PANEL WITH GFR She has received 3 pfizer covid-19 vaccine doses.   Sjogren's syndrome with keratoconjunctivitis sicca ([facility]) - ANA+, Ro+: She continues to have chronic sicca symptoms which have been tolerable.  She has been using gel lubricating drops for dry eyes and Biotene mouthwash for dry mouth, which have been managing her symptoms.  She has been seeing her ophthalmologist on a yearly basis and her dentist every 4 months.  Her most recent dental appointment was last week at which time she did not have any dental caries.  We discussed the importance of good oral hygiene.  She has not had any parotid swelling or tenderness.  No cervical lymphadenopathy.  She will continue taking Plaquenil as prescribed.  We will check CBC, CMP, and UA today.- Plan: CBC with Differential/Platelet, COMPLETE METABOLIC PANEL WITH GFR, Urinalysis, Routine w reflex microscopic  Primary osteoarthritis of both hands: She has PIP and DIP thickening consistent with osteoarthritis of both hands. No joint tenderness or inflammation noted.  Complete fist formation bilaterally.  Joint protection and muscle strengthening were discussed.   Primary osteoarthritis of both knees: She has good ROM of both knee joint with crepitus.  No warmth or effusion of knee joints noted.  She underwent the series of 3 Synvisc injections in February/March 2022, which provided  significant pain relief.   Trochanteric bursitis of left hip: She presents today with discomfort due to trochanteric bursitis of the left hip.  She requested a cortisone injection today.  She tolerated the procedure well.  Aftercare was discussed.  She was encouraged to perform daily stretching exercises starting next week.  Osteopenia of multiple sites - 02/02/20: The BMD measured at Femur Neck Left is 0.736 g/cm2 with a T-score of-2.2.  She is taking a calcium and vitamin D supplement daily.  She took boniva from March 2019 to March 2022-discontinued due to bilateral shin pain.  She has not had any recent falls or fractures.   History of vitamin D deficiency: She is taking vitamin D 2,000 units daily.    Other fatigue: She is not experiencing any fatigue at this time. Discussed the importance of regular exercise.   History of depression: She is taking lexapro as prescribed.   History of anxiety: She is taking lexapro as prescribed.    Hypothyroidism, adult: She is taking synthroid as prescribed.   Orders: Orders Placed This Encounter  Procedures  . Large Joint Inj  . CBC with Differential/Platelet  . COMPLETE METABOLIC PANEL WITH GFR  . Anti-DNA antibody, double-stranded  . C3 and C4  . Sedimentation rate  . Urinalysis, Routine w reflex microscopic   No orders of the defined types were placed in this encounter.     Follow-Up Instructions: Return in about 5 months (around 06/20/2021) for Autoimmune Disease, Sjogren's syndrome.   Christine Neas, PA-C  Note - This record has been created using Dragon software.  Chart creation errors have been sought, but may not always  have been located. Such creation errors do not reflect on  the standard of medical care.

## 2021-01-16 ENCOUNTER — Other Ambulatory Visit: Payer: Self-pay | Admitting: Rheumatology

## 2021-01-18 ENCOUNTER — Ambulatory Visit: Payer: Medicare Other | Admitting: Physician Assistant

## 2021-01-18 ENCOUNTER — Encounter: Payer: Self-pay | Admitting: Physician Assistant

## 2021-01-18 ENCOUNTER — Other Ambulatory Visit: Payer: Self-pay

## 2021-01-18 VITALS — BP 117/78 | HR 59 | Ht 67.0 in | Wt 159.4 lb

## 2021-01-18 DIAGNOSIS — M170 Bilateral primary osteoarthritis of knee: Secondary | ICD-10-CM

## 2021-01-18 DIAGNOSIS — M359 Systemic involvement of connective tissue, unspecified: Secondary | ICD-10-CM | POA: Diagnosis not present

## 2021-01-18 DIAGNOSIS — Z8659 Personal history of other mental and behavioral disorders: Secondary | ICD-10-CM

## 2021-01-18 DIAGNOSIS — Z79899 Other long term (current) drug therapy: Secondary | ICD-10-CM | POA: Diagnosis not present

## 2021-01-18 DIAGNOSIS — M7062 Trochanteric bursitis, left hip: Secondary | ICD-10-CM | POA: Diagnosis not present

## 2021-01-18 DIAGNOSIS — E039 Hypothyroidism, unspecified: Secondary | ICD-10-CM

## 2021-01-18 DIAGNOSIS — M3501 Sicca syndrome with keratoconjunctivitis: Secondary | ICD-10-CM

## 2021-01-18 DIAGNOSIS — M19041 Primary osteoarthritis, right hand: Secondary | ICD-10-CM

## 2021-01-18 DIAGNOSIS — M8589 Other specified disorders of bone density and structure, multiple sites: Secondary | ICD-10-CM

## 2021-01-18 DIAGNOSIS — R5383 Other fatigue: Secondary | ICD-10-CM

## 2021-01-18 DIAGNOSIS — M19042 Primary osteoarthritis, left hand: Secondary | ICD-10-CM

## 2021-01-18 DIAGNOSIS — Z8639 Personal history of other endocrine, nutritional and metabolic disease: Secondary | ICD-10-CM

## 2021-01-18 MED ORDER — TRIAMCINOLONE ACETONIDE 40 MG/ML IJ SUSP
40.0000 mg | INTRAMUSCULAR | Status: AC | PRN
Start: 2021-01-18 — End: 2021-01-18
  Administered 2021-01-18: 40 mg via INTRA_ARTICULAR

## 2021-01-18 MED ORDER — LIDOCAINE HCL 1 % IJ SOLN
1.5000 mL | INTRAMUSCULAR | Status: AC | PRN
  Administered 2021-01-18: 1.5 mL

## 2021-01-19 LAB — COMPLETE METABOLIC PANEL WITH GFR
AG Ratio: 1.7 (calc) (ref 1.0–2.5)
ALT: 13 U/L (ref 6–29)
AST: 17 U/L (ref 10–35)
Albumin: 4.1 g/dL (ref 3.6–5.1)
Alkaline phosphatase (APISO): 45 U/L (ref 37–153)
BUN: 18 mg/dL (ref 7–25)
CO2: 30 mmol/L (ref 20–32)
Calcium: 9.6 mg/dL (ref 8.6–10.4)
Chloride: 107 mmol/L (ref 98–110)
Creat: 0.74 mg/dL (ref 0.60–0.93)
GFR, Est African American: 92 mL/min/{1.73_m2} (ref >=60)
GFR, Est Non African American: 80 mL/min/{1.73_m2} (ref >=60)
Globulin: 2.4 g/dL (calc) (ref 1.9–3.7)
Glucose, Bld: 79 mg/dL (ref 65–99)
Potassium: 4.4 mmol/L (ref 3.5–5.3)
Sodium: 141 mmol/L (ref 135–146)
Total Bilirubin: 0.5 mg/dL (ref 0.2–1.2)
Total Protein: 6.5 g/dL (ref 6.1–8.1)

## 2021-01-19 LAB — URINALYSIS, ROUTINE W REFLEX MICROSCOPIC
Bilirubin Urine: NEGATIVE
Glucose, UA: NEGATIVE
Hgb urine dipstick: NEGATIVE
Ketones, ur: NEGATIVE
Leukocytes,Ua: NEGATIVE
Nitrite: NEGATIVE
Protein, ur: NEGATIVE
Specific Gravity, Urine: 1.006 (ref 1.001–1.03)
pH: 6.5 (ref 5.0–8.0)

## 2021-01-19 LAB — CBC WITH DIFFERENTIAL/PLATELET
Absolute Monocytes: 653 cells/uL (ref 200–950)
Basophils Absolute: 61 cells/uL (ref 0–200)
Basophils Relative: 1 %
Eosinophils Absolute: 201 cells/uL (ref 15–500)
Eosinophils Relative: 3.3 %
HCT: 41.5 % (ref 35.0–45.0)
Hemoglobin: 13.9 g/dL (ref 11.7–15.5)
Lymphs Abs: 1031 cells/uL (ref 850–3900)
MCH: 32.7 pg (ref 27.0–33.0)
MCHC: 33.5 g/dL (ref 32.0–36.0)
MCV: 97.6 fL (ref 80.0–100.0)
MPV: 10.5 fL (ref 7.5–12.5)
Monocytes Relative: 10.7 %
Neutro Abs: 4154 cells/uL (ref 1500–7800)
Neutrophils Relative %: 68.1 %
Platelets: 269 10*3/uL (ref 140–400)
RBC: 4.25 10*6/uL (ref 3.80–5.10)
RDW: 11.9 % (ref 11.0–15.0)
Total Lymphocyte: 16.9 %
WBC: 6.1 10*3/uL (ref 3.8–10.8)

## 2021-01-19 LAB — C3 AND C4
C3 Complement: 116 mg/dL (ref 83–193)
C4 Complement: 24 mg/dL (ref 15–57)

## 2021-01-19 LAB — SEDIMENTATION RATE: Sed Rate: 6 mm/h (ref 0–30)

## 2021-01-19 LAB — ANTI-DNA ANTIBODY, DOUBLE-STRANDED: ds DNA Ab: 1 IU/mL

## 2021-01-19 NOTE — Progress Notes (Signed)
CBC and CMP WNL.  UA normal.  Complements WNL.  ESR WNL.

## 2021-01-19 NOTE — Progress Notes (Signed)
dsDNA negative.  No change in therapy at this time.

## 2021-02-17 ENCOUNTER — Other Ambulatory Visit: Payer: Self-pay | Admitting: Rheumatology

## 2021-02-17 ENCOUNTER — Encounter: Payer: Self-pay | Admitting: Rheumatology

## 2021-03-11 DIAGNOSIS — M3501 Sicca syndrome with keratoconjunctivitis: Secondary | ICD-10-CM

## 2021-03-11 DIAGNOSIS — Z79899 Other long term (current) drug therapy: Secondary | ICD-10-CM

## 2021-03-11 DIAGNOSIS — M359 Systemic involvement of connective tissue, unspecified: Secondary | ICD-10-CM

## 2021-03-13 MED ORDER — HYDROXYCHLOROQUINE SULFATE 200 MG PO TABS: 200.0000 mg | ORAL_TABLET | Freq: Every day | ORAL | 0 refills | Status: DC

## 2021-03-13 NOTE — Telephone Encounter (Signed)
Last Visit: 3/30-/2022 Next Visit: 06/21/2021 Labs: 01/18/2021, CBC and CMP WNL. UA normal. Complements WNL. ESR WNL. dsDNA negative. No change in therapy at this time.  Eye exam: 09/09/2020  Current Dose per office note 01/18/2021, Plaquenil 200 mg 1 tablet by mouth daily DX:  Autoimmune disease   Last Fill: 07/22/2020  Okay to refill Plaquenil?

## 2021-03-21 ENCOUNTER — Ambulatory Visit: Payer: Medicare Other | Admitting: Physician Assistant

## 2021-03-29 ENCOUNTER — Other Ambulatory Visit: Payer: Self-pay | Admitting: Physical Medicine and Rehabilitation

## 2021-03-29 ENCOUNTER — Encounter: Payer: Self-pay | Admitting: Rheumatology

## 2021-03-29 DIAGNOSIS — M5459 Other low back pain: Secondary | ICD-10-CM

## 2021-03-30 NOTE — Telephone Encounter (Signed)
Ideally she should try alternating the dosing but the medications can be taken together if the pain is severe.

## 2021-03-30 NOTE — Telephone Encounter (Signed)
The long term use of NSAIDs is not recommended over the age of 31 but she can take ibuprofen sparingly for pain relief if necessary.  She can try alternating tylenol and ibuprofen depending on the severity of her pain.  She can try taking acetaminophen 500 mg to 1000 mg every 6 hours PRN, and if she takes ibuprofen I would try 200 mg to 400 mg every 6-8 hours as needed for pain.    Please advise the patient to take ibuprofen with food.  If she would like to return for lab work in 1 month we can recheck her CMP with GFR to monitor her renal and liver function closely.

## 2021-03-31 ENCOUNTER — Other Ambulatory Visit: Payer: Self-pay

## 2021-03-31 ENCOUNTER — Ambulatory Visit
Admission: RE | Admit: 2021-03-31 | Discharge: 2021-03-31 | Disposition: A | Discharge status: Home / Self Care | Payer: Medicare Other | Source: Ambulatory Visit | Attending: Physical Medicine and Rehabilitation | Admitting: Physical Medicine and Rehabilitation

## 2021-03-31 DIAGNOSIS — M5459 Other low back pain: Secondary | ICD-10-CM

## 2021-04-03 ENCOUNTER — Other Ambulatory Visit: Payer: Medicare Other

## 2021-04-09 ENCOUNTER — Other Ambulatory Visit: Payer: Self-pay | Admitting: Rheumatology

## 2021-04-09 DIAGNOSIS — M3501 Sicca syndrome with keratoconjunctivitis: Secondary | ICD-10-CM

## 2021-04-09 DIAGNOSIS — M359 Systemic involvement of connective tissue, unspecified: Secondary | ICD-10-CM

## 2021-04-09 DIAGNOSIS — Z79899 Other long term (current) drug therapy: Secondary | ICD-10-CM

## 2021-04-13 ENCOUNTER — Other Ambulatory Visit: Payer: Self-pay | Admitting: Family Medicine

## 2021-04-13 DIAGNOSIS — M810 Age-related osteoporosis without current pathological fracture: Secondary | ICD-10-CM

## 2021-05-01 ENCOUNTER — Other Ambulatory Visit: Payer: Self-pay

## 2021-05-01 ENCOUNTER — Ambulatory Visit (HOSPITAL_BASED_OUTPATIENT_CLINIC_OR_DEPARTMENT_OTHER)
Admission: RE | Admit: 2021-05-01 | Discharge: 2021-05-01 | Disposition: A | Discharge status: Home / Self Care | Payer: Medicare Other | Source: Ambulatory Visit | Attending: Family Medicine | Admitting: Family Medicine

## 2021-05-01 DIAGNOSIS — M810 Age-related osteoporosis without current pathological fracture: Secondary | ICD-10-CM

## 2021-05-03 ENCOUNTER — Other Ambulatory Visit: Payer: Self-pay

## 2021-05-03 ENCOUNTER — Other Ambulatory Visit: Payer: Medicare Other

## 2021-05-03 ENCOUNTER — Ambulatory Visit
Admission: RE | Admit: 2021-05-03 | Discharge: 2021-05-03 | Disposition: A | Discharge status: Home / Self Care | Payer: Medicare Other | Source: Ambulatory Visit | Attending: Family Medicine | Admitting: Family Medicine

## 2021-05-05 ENCOUNTER — Other Ambulatory Visit: Payer: Self-pay | Admitting: Student

## 2021-05-05 DIAGNOSIS — S32010A Wedge compression fracture of first lumbar vertebra, initial encounter for closed fracture: Secondary | ICD-10-CM

## 2021-05-10 NOTE — Progress Notes (Deleted)
Office Visit Note  Patient: [name]             Date of Birth: Aug 25, 1946           MRN: [id number]             PCP: Cari Caraway, MD Referring: Cari Caraway, MD Visit Date: 05/11/2021 Occupation: _0 @  Subjective:    History of Present Illness: [name] is a 75 y.o. female with history of autoimmune disease, Sjogren's syndrome, and osteoarthritis.  She is taking Plaquenil 200 mg 1 tablet by mouth daily.  Lab work from 01/18/2021 was reviewed today in the office: dsDNA negative, complements within normal limits, ESR within normal limits, CBC and CMP WNL, and UA unremarkable.  Activities of Daily Living:  Patient reports morning stiffness for *** {minute/hour:19697}.   Patient {ACTIONS;DENIES/REPORTS:21021675::"Denies"} nocturnal pain.  Difficulty dressing/grooming: {ACTIONS;DENIES/REPORTS:21021675::"Denies"} Difficulty climbing stairs: {ACTIONS;DENIES/REPORTS:21021675::"Denies"} Difficulty getting out of chair: {ACTIONS;DENIES/REPORTS:21021675::"Denies"} Difficulty using hands for taps, buttons, cutlery, and/or writing: {ACTIONS;DENIES/REPORTS:21021675::"Denies"}  No Rheumatology ROS completed.   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% 07/20/2009    Past Medical History:  Diagnosis Date   Allergy    Colon polyp    Environmental allergies    Hypothyroid    Osteoarthritis    Osteoarthritis    Osteoarthritis    Osteopenia    Plantar fasciitis,  right    Raynauds phenomenon    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   Sjogren's syndrome ([place])    Systemic lupus ([place])     Family History  Problem Relation Age of Onset   Heart disease Mother    Psoriasis Father    Arthritis Father    Heart disease Brother    Heart attack Brother    Diabetes Maternal Grandfather    Cancer Maternal Uncle        colon   Colon cancer Maternal Uncle 80   Asthma Sister    Breast cancer Neg Hx    Past Surgical History:  Procedure Laterality Date   APPENDECTOMY     BUNIONECTOMY  2008   right foot   CATARACT EXTRACTION     COLONOSCOPY W/ POLYPECTOMY  Aug 27, 2011   ENDOMETRIAL ABLATION     FOOT SURGERY Right 12/17/2017   bone spur   ingrown toenail Right 12/16/2018   LAPAROSCOPIC APPENDECTOMY  10/11/2011   Procedure: APPENDECTOMY LAPAROSCOPIC;  Surgeon: Adin Hector, MD;  Location: [facility];  Service: General;  Laterality: N/A;  Partial Cecectomy and Appendectomy   WISDOM TOOTH EXTRACTION     Social History   Social History Narrative   Not on file   Immunization History  Administered Date(s) Administered   Influenza, High Dose Seasonal PF 07/05/2018, 06/18/2019   Influenza,inj,quad, With Preservative 07/10/2017   Influenza-Unspecified 07/23/2020   PFIZER(Purple Top)SARS-COV-2 Vaccination 11/16/2019, 12/07/2019, 06/14/2020   Pneumococcal Conjugate-13 09/08/2014   Pneumococcal Polysaccharide-23 08/30/2011   Tdap 06/20/2011   Zoster, Live 10/11/2007     Objective: Vital Signs: There were no vitals  taken for this visit.   Physical Exam Vitals and nursing note reviewed.  Constitutional:      Appearance: She is well-developed.  HENT:     Head: Normocephalic and atraumatic.  Eyes:     Conjunctiva/sclera: Conjunctivae normal.  Pulmonary:     Effort: Pulmonary effort is normal.  Abdominal:     Palpations: Abdomen is soft.  Musculoskeletal:     Cervical back: Normal range of motion.  Skin:    General: Skin is warm  and dry.     Capillary Refill: Capillary refill takes less than 2 seconds.  Neurological:     Mental Status: She is alert and oriented to person, place, and time.  Psychiatric:        Behavior: Behavior normal.     Musculoskeletal Exam: ***  CDAI Exam: CDAI Score: -- Patient Global: --; Provider Global: -- Swollen: --; Tender: -- Joint Exam 05/11/2021   No joint exam has been documented for this visit   There is currently no information documented on the homunculus. Go to the Rheumatology activity and complete the homunculus joint exam.  Investigation: No additional findings.  Imaging: DG BONE DENSITY (DXA)  Result Date: 05/04/2021 EXAM: DUAL X-RAY ABSORPTIOMETRY (DXA) FOR BONE MINERAL DENSITY IMPRESSION: Referring Physician:  [facility] MCNEILL Your patient completed a bone mineral density test using GE Lunar iDXA system (analysis version: 16). Technologist: [place] PATIENT: Name: Christine, Ruiz Patient ID: [id number] Birth Date: 1946-03-12 Height: 65.0 in. Sex: Female Measured: 05/03/2021 Weight: 149.4 lbs. Indications: Advanced Age, Caucasian, Estrogen Deficient, Family Hist. (Parent hip fracture), Family History of Osteoporosis, Postmenopausal Fractures: vertebrae Treatments: Boniva, Calcitonin, Calcium (E943.0), Vitamin D (E933.5) ASSESSMENT: The BMD measured at Femur Neck Right is 0.759 g/cm2 with a T-score of -2.0. This patient is considered osteopenic/low bone mass according to [facility]) criteria. The quality of the exam is good. The lumbar spine was excluded due to degenerative changes. The patient does not meet FRAX criteria. Site Region Measured Date Measured Age YA BMD Significant CHANGE T-score DualFemur Neck Right 05/03/2021 74.7 -2.0 0.759 g/cm2 DualFemur Neck Right 02/02/2020 73.4 -1.9 0.768 g/cm2 DualFemur Total Mean 05/03/2021 74.7 -1.2 0.852 g/cm2 DualFemur Total Mean 02/02/2020 73.4 -1.2 0.855 g/cm2 Left Forearm Radius 33% 05/03/2021 74.7 -0.5 0.845 g/cm2  World Health Organization [facility]) criteria for post-menopausal, Caucasian Women: Normal       T-score at or above -1 SD Osteopenia   T-score between -1 and -2.5 SD Osteoporosis T-score at or below -2.5 SD RECOMMENDATION: 1. All patients should optimize calcium and vitamin D intake. 2. Consider FDA-approved medical therapies in postmenopausal women and men aged 29 years and older, based on the following: a. A hip or vertebral (clinical or morphometric) fracture. b. T-score = -2.5 at the femoral neck or spine after appropriate evaluation to exclude secondary causes. c. Low bone mass (T-score between -1.0 and -2.5 at the femoral neck or spine) and a 10-year probability of a hip fracture = 3% or a 10-year probability of a major osteoporosis-related fracture = 20% based on the [place]-adapted WHO algorithm. d. Clinician judgment and/or patient preferences may indicate treatment for people with 10-year fracture probabilities above or below these levels. FOLLOW-UP: Patients with diagnosis of osteoporosis or at high risk for fracture should have regular bone mineral density tests.? Patients eligible for Medicare are allowed routine testing every 2 years.? The testing frequency can be increased to one year for patients who have rapidly progressing disease, are receiving or discontinuing medical therapy to  restore bone mass, or have additional risk factors. I have reviewed this study and agree with the findings. Mark A. Thornton Papas, M.D. [facility] Electronically Signed   By: Lavonia Dana M.D.   On: 05/04/2021 17:03    Recent Labs: Lab Results  Component Value Date   WBC 6.1 01/18/2021   HGB 13.9 01/18/2021   PLT 269 01/18/2021   NA 141 01/18/2021   K 4.4 01/18/2021   CL 107 01/18/2021   CO2 30 01/18/2021   GLUCOSE 79 01/18/2021   BUN 18 01/18/2021   CREATININE 0.74 01/18/2021   BILITOT 0.5 01/18/2021   ALKPHOS 48 02/11/2017   AST 17 01/18/2021   ALT 13 01/18/2021   PROT 6.5 01/18/2021   ALBUMIN 3.9  02/11/2017   CALCIUM 9.6 01/18/2021   GFRAA 92 01/18/2021    Speciality Comments: PLQ eye exam: 09/09/2020 Normal. Dr. Rutherford Guys. Follow up in 1 year. (in [place])  Procedures:  No procedures performed Allergies: Latex and Sulfonamide derivatives   Assessment / Plan:     Visit Diagnoses: Autoimmune disease ([place]) - ANA and Raynaud's phenomena and arthritis:   High risk medication use - Plaquenil 200 mg 1 tablet by mouth daily. PLQ eye exam: 09/09/2020  Sjogren's syndrome with keratoconjunctivitis sicca (HCC) - ANA+, Ro+:   Primary osteoarthritis of both knees - She underwent the series of 3 Synvisc injections in February/March 2022, which provided significant pain relief.   Primary osteoarthritis of both hands  Trochanteric bursitis of left hip  Osteopenia of multiple sites - 02/02/20: The BMD measured at Femur Neck Left is 0.736 g/cm2 with a T-score of-2.2. took boniva from 12/2017 to March 2022-discontinued due to bilateral shin pain  Other fatigue  History of vitamin D deficiency  History of depression  History of anxiety  Hypothyroidism, adult  Orders: No orders of the defined types were placed in this encounter.  No orders of the defined types were placed in this encounter.    Follow-Up Instructions: No follow-ups on file.   Ofilia Neas, PA-C  Note - This record has been created using Dragon software.  Chart creation errors have been sought, but may not always  have been located. Such creation errors do not reflect on  the standard of medical care.

## 2021-05-11 ENCOUNTER — Ambulatory Visit: Payer: Medicare Other | Admitting: Physician Assistant

## 2021-05-11 DIAGNOSIS — R5383 Other fatigue: Secondary | ICD-10-CM

## 2021-05-11 DIAGNOSIS — Z79899 Other long term (current) drug therapy: Secondary | ICD-10-CM

## 2021-05-11 DIAGNOSIS — M3501 Sicca syndrome with keratoconjunctivitis: Secondary | ICD-10-CM

## 2021-05-11 DIAGNOSIS — M170 Bilateral primary osteoarthritis of knee: Secondary | ICD-10-CM

## 2021-05-11 DIAGNOSIS — E039 Hypothyroidism, unspecified: Secondary | ICD-10-CM

## 2021-05-11 DIAGNOSIS — M19041 Primary osteoarthritis, right hand: Secondary | ICD-10-CM

## 2021-05-11 DIAGNOSIS — Z8659 Personal history of other mental and behavioral disorders: Secondary | ICD-10-CM

## 2021-05-11 DIAGNOSIS — M359 Systemic involvement of connective tissue, unspecified: Secondary | ICD-10-CM

## 2021-05-11 DIAGNOSIS — Z8639 Personal history of other endocrine, nutritional and metabolic disease: Secondary | ICD-10-CM

## 2021-05-11 DIAGNOSIS — M8589 Other specified disorders of bone density and structure, multiple sites: Secondary | ICD-10-CM

## 2021-05-11 DIAGNOSIS — M7062 Trochanteric bursitis, left hip: Secondary | ICD-10-CM

## 2021-05-11 NOTE — Progress Notes (Signed)
Office Visit Note  Patient: [name]             Date of Birth: 04/10/1946           MRN: [id number]             PCP: Cari Caraway, MD Referring: Cari Caraway, MD Visit Date: 05/12/2021 Occupation: @GUAROCC @  Subjective:  Left trochanteric bursitis   History of Present Illness: [name] is a 75 y.o. female with history of autoimmune disease, Sjogren's, and osteoarthritis.  Patient is taking Plaquenil 200 mg 1 tablet by mouth daily.  She denies missing any doses of Plaquenil recently.  She denies any signs or symptoms of an autoimmune disease flare recently.  Her sicca symptoms have been well managed using lubricating eyedrops as well as Biotene products.  She denies any swollen lymph nodes or oral or nasal ulcerations.  She experiences intermittent symptoms of Raynaud's but denies any digital ulcerations.  She has not had any shortness of breath, pleuritic chest pain, or palpitations.  She denies any recent rashes or increased photosensitivity. She reports that on Memorial Day she was performing yard work and fell backwards on her concrete driveway.  She states that she was evaluated at her orthopedist office the next day and was diagnosed with an L1 compression fracture.  She was referred to Dr. Rolena Infante who recommended proceeding with a kyphoplasty.  She went for a second opinion with Dr. Ronnald Ramp who recommended wearing a brace for 3 months to allow for proper healing.  She decided against the kyphoplasty due to a fragment of the bone scan complicating factor for the kyphoplasty to be performed.  Her pain has been about a 4 out of 10 on a daily basis.  She has been taking calcitonin as prescribed and states that her PCP plans to start her on Prolia once the fracture is healed.  She has an upcoming lumbar CT scheduled on 06/01/2021 to assess the healing process.  She has been using a walker to assist with ambulation.  She denies any discomfort in her knee joints at this time but states  that her right knee buckled recently which concerned her for another fall in the future.  She denies any joint swelling at this time.  She is experiencing discomfort due to trochanteric bursitis of the left hip.  Her discomfort has been intermittent but she does have difficulty sleeping at night laying on her side.  She has been taking tramadol 50 mg 1 tablet every morning for pain relief.    Activities of Daily Living:  Patient reports morning stiffness for 5 minutes.   Patient Reports nocturnal pain.  Difficulty dressing/grooming: Denies Difficulty climbing stairs: Denies Difficulty getting out of chair: Denies Difficulty using hands for taps, buttons, cutlery, and/or writing: Denies  Review of Systems  Constitutional:  Positive for fatigue.  HENT:  Positive for mouth dryness. Negative for nose dryness.   Eyes:  Positive for dryness. Negative for pain and itching.  Respiratory:  Negative for shortness of breath and difficulty breathing.   Cardiovascular:  Negative for chest pain and palpitations.  Gastrointestinal:  Negative for blood in stool, constipation and diarrhea.  Endocrine: Negative for increased urination.  Genitourinary:  Negative for difficulty urinating.  Musculoskeletal:  Positive for morning stiffness. Negative for joint pain, joint pain, joint swelling, myalgias, muscle tenderness and myalgias.  Skin:  Negative for color change, rash and redness.  Allergic/Immunologic: Negative for susceptible to infections.  Neurological:  Negative for  dizziness, numbness, headaches, memory loss and weakness.  Hematological:  Positive for bruising/bleeding tendency.  Psychiatric/Behavioral:  Negative for confusion.    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% 07/20/2009    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   Sjogren's syndrome ([place])    Systemic lupus ([place])     Family History  Problem Relation Age of Onset   Heart disease Mother    Psoriasis Father    Arthritis Father    Heart disease Brother    Heart attack Brother    Diabetes Maternal Grandfather    Cancer Maternal Uncle        colon   Colon cancer Maternal Uncle 80   Asthma Sister    Breast cancer Neg Hx    Past Surgical History:  Procedure Laterality Date   APPENDECTOMY     BUNIONECTOMY  2008   right foot   CATARACT EXTRACTION     COLONOSCOPY W/ POLYPECTOMY  Aug 27, 2011   ENDOMETRIAL ABLATION     FOOT SURGERY Right 12/17/2017   bone spur   ingrown toenail Right 12/16/2018   LAPAROSCOPIC APPENDECTOMY  10/11/2011   Procedure: APPENDECTOMY LAPAROSCOPIC;  Surgeon: Adin Hector, MD;  Location: [facility];  Service: General;  Laterality: N/A;  Partial Cecectomy and Appendectomy   WISDOM TOOTH EXTRACTION     Social History   Social History Narrative   Not on file   Immunization History  Administered Date(s) Administered   Influenza, High Dose Seasonal PF 07/05/2018, 06/18/2019   Influenza,inj,quad, With Preservative 07/10/2017   Influenza-Unspecified 07/23/2020   PFIZER(Purple Top)SARS-COV-2 Vaccination 11/16/2019, 12/07/2019, 06/14/2020   Pneumococcal Conjugate-13 09/08/2014    Pneumococcal Polysaccharide-23 08/30/2011   Tdap 06/20/2011   Zoster, Live 10/11/2007     Objective: Vital Signs: BP 131/84 (BP Location: Left Arm, Patient Position: Sitting, Cuff Size: Normal)   Pulse 67   Ht 5' 5.25" (1.657 m)   Wt 152 lb (68.9 kg)   BMI 25.10 kg/m    Physical Exam Vitals and nursing note reviewed.  Constitutional:      Appearance: She is well-developed.  HENT:     Head: Normocephalic and atraumatic.  Eyes:     Conjunctiva/sclera: Conjunctivae normal.  Pulmonary:     Effort: Pulmonary effort is normal.  Abdominal:     Palpations: Abdomen is soft.  Musculoskeletal:     Cervical back: Normal range of motion.  Skin:    General: Skin is warm and dry.     Capillary Refill: Capillary refill takes less than 2 seconds.  Neurological:     Mental Status: She is alert  and oriented to person, place, and time.  Psychiatric:        Behavior: Behavior normal.     Musculoskeletal Exam: C-spine has good range of motion with no discomfort.  Thoracic and lumbar spine in a brace.  Shoulder joints, elbow joints, wrist joints, MCPs, PIPs, DIPs have good range of motion with no synovitis.  PIP and DIP thickening consistent with osteoarthritis of both hands noted.  Complete fist formation bilaterally.  Hip joints difficult to assess well in seated position.  Knee joints have good range of motion with crepitus bilaterally.  No warmth or effusion of knee joints noted.  Ankle joints have good range of motion with no tenderness or joint swelling.  CDAI Exam: CDAI Score: -- Patient Global: --; Provider Global: -- Swollen: --; Tender: -- Joint Exam 05/12/2021   No joint exam has been documented for this visit   There is currently no information documented on the homunculus. Go to the Rheumatology activity and complete the homunculus joint exam.  Investigation: No additional findings.  Imaging: DG BONE DENSITY (DXA)  Result Date: 05/04/2021 EXAM: DUAL X-RAY ABSORPTIOMETRY  (DXA) FOR BONE MINERAL DENSITY IMPRESSION: Referring Physician:  [facility] MCNEILL Your patient completed a bone mineral density test using GE Lunar iDXA system (analysis version: 16). Technologist: [place] PATIENT: Name: Asya, Derryberry Patient ID: [id number] Birth Date: 07/07/1946 Height: 65.0 in. Sex: Female Measured: 05/03/2021 Weight: 149.4 lbs. Indications: Advanced Age, Caucasian, Estrogen Deficient, Family Hist. (Parent hip fracture), Family History of Osteoporosis, Postmenopausal Fractures: vertebrae Treatments: Boniva, Calcitonin, Calcium (E943.0), Vitamin D (E933.5) ASSESSMENT: The BMD measured at Femur Neck Right is 0.759 g/cm2 with a T-score of -2.0. This patient is considered osteopenic/low bone mass according to [facility]) criteria. The quality of the exam is good. The lumbar spine was excluded due to degenerative changes. The patient does not meet FRAX criteria. Site Region Measured Date Measured Age YA BMD Significant CHANGE T-score DualFemur Neck Right 05/03/2021 74.7 -2.0 0.759 g/cm2 DualFemur Neck Right 02/02/2020 73.4 -1.9 0.768 g/cm2 DualFemur Total Mean 05/03/2021 74.7 -1.2 0.852 g/cm2 DualFemur Total Mean 02/02/2020 73.4 -1.2 0.855 g/cm2 Left Forearm Radius 33% 05/03/2021 74.7 -0.5 0.845 g/cm2 World Health Organization [facility]) criteria for post-menopausal, Caucasian Women: Normal       T-score at or above -1 SD Osteopenia   T-score between -1 and -2.5 SD Osteoporosis T-score at or below -2.5 SD RECOMMENDATION: 1. All patients should optimize calcium and vitamin D intake. 2. Consider FDA-approved medical therapies in postmenopausal women and men aged 47 years and older, based on the following: a. A hip or vertebral (clinical or morphometric) fracture. b. T-score = -2.5 at the femoral neck or spine after appropriate evaluation to exclude secondary causes. c. Low bone mass (T-score between -1.0 and -2.5 at the femoral neck or spine) and a 10-year probability of a hip fracture = 3% or a  10-year probability of a major osteoporosis-related fracture = 20% based on the [place]-adapted WHO algorithm. d. Clinician judgment and/or patient preferences may indicate treatment for people with 10-year fracture probabilities above or below these levels. FOLLOW-UP: Patients with diagnosis of osteoporosis or at high risk for fracture should have regular bone mineral density tests.? Patients eligible for Medicare are allowed routine testing every 2 years.? The testing frequency can be increased to one year for patients who have rapidly progressing disease, are receiving or discontinuing medical therapy to restore bone mass, or have additional risk factors. I have reviewed this study and agree with  the findings. Mark A. Thornton Papas, M.D. [facility] Electronically Signed   By: Lavonia Dana M.D.   On: 05/04/2021 17:03    Recent Labs: Lab Results  Component Value Date   WBC 6.1 01/18/2021   HGB 13.9 01/18/2021   PLT 269 01/18/2021   NA 141 01/18/2021   K 4.4 01/18/2021   CL 107 01/18/2021   CO2 30 01/18/2021   GLUCOSE 79 01/18/2021   BUN 18 01/18/2021   CREATININE 0.74 01/18/2021   BILITOT 0.5 01/18/2021   ALKPHOS 48 02/11/2017   AST 17 01/18/2021   ALT 13 01/18/2021   PROT 6.5 01/18/2021   ALBUMIN 3.9 02/11/2017   CALCIUM 9.6 01/18/2021   GFRAA 92 01/18/2021    Speciality Comments: PLQ eye exam: 09/09/2020 Normal. Dr. Rutherford Guys. Follow up in 1 year. (in [place])  Procedures:  No procedures performed Allergies: Boniva [ibandronic acid], Latex, and Sulfonamide derivatives     e. Assessment / Plan:     Visit Diagnoses: Autoimmune disease ([place]) - ANA and Raynaud's phenomena and arthritis: She has not had any signs or symptoms of a flare recently.  She is clinically doing well taking Plaquenil 200 mg 1 tablet by mouth daily.  She continues to tolerate Plaquenil without any side effects and has not missed any doses recently.  She has occasional arthralgias and joint  stiffness but has no synovitis on examination today.  She has been experiencing intermittent symptoms of Raynaud's but no digital ulcerations or signs of sclerodactyly were noted.  She has ongoing sicca symptoms which have been tolerable with the use of lubricating eyedrops and Biotene products.  She has not had any oral or nasal ulcerations.  She is not experiencing any shortness of breath, pleuritic chest pain, or palpitations.  She has not had any recent rashes or increased photosensitivity.  We discussed the importance of avoiding direct sun exposure.  Lab work from 01/18/2021 was reviewed today in the office: Double-stranded ENA negative, complements within normal limits, ESR within normal limits, CBC and CMP within normal limits, and UA unremarkable.  We will update the following lab work today.  She will remain on Plaquenil as prescribed.  A refill of Plaquenil sent to the pharmacy.  She was advised to notify us if she develops any new or worsening symptoms.  She will follow-up in the office in 5 months. - Plan: CBC with Differential/Platelet, Protein / creatinine ratio, urine, COMPLETE METABOLIC PANEL WITH GFR, Anti-DNA antibody, double-stranded, C3 and C4, VITAMIN D 25 Hydroxy (Vit-D Deficiency, Fractures), Sedimentation rate, hydroxychloroquine (PLAQUENIL) 200 MG tablet  High risk medication use - Plaquenil 200 mg 1 tablet by mouth daily. PLQ eye exam: 09/09/2020.  CBC and CMP were within normal limits on 01/18/2021.  We will recheck CBC and CMP today.- Plan: CBC with Differential/Platelet, COMPLETE METABOLIC PANEL WITH GFR, hydroxychloroquine (PLAQUENIL) 200 MG tablet  Sjogren's syndrome with keratoconjunctivitis sicca (HCC) - ANA+, Ro+: Her sicca symptoms have been tolerable with the use of lubricating eyedrops and Biotene products for symptomatic relief.  She has not had any cervical lymphadenopathy.  Her energy level has been stable.  No signs of inflammatory arthritis noted.  She will remain on  Plaquenil as prescribed.- Plan: hydroxychloroquine (PLAQUENIL) 200 MG tablet  Primary osteoarthritis of both knees: She is not experiencing any discomfort in her knee joints at this time.  She had Visco gel injections in both knees on 12/12/2020, 12/19/2020, and 12/26/2020.  She recently experienced a buckling sensation in her right  knee which concerned her for possible fall in the future.  She has been using a walker to assist with ambulation.  She would like to proceed with Visco gel injections again once she is eligible.  Primary osteoarthritis of both hands: She has PIP and DIP thickening consistent with osteoarthritis of both hands.  CMC joint prominence noted bilaterally.  She is not experiencing any tenderness and has no inflammation on examination today.  We discussed the importance of joint protection and muscle strengthening.  Trochanteric bursitis of left hip: She presents today with discomfort or discomfort in the left hip due to trochanter bursitis.  Her discomfort has been intermittent during the day but she has had difficulty sleeping at night due to the pain when laying on her sides at night.  She had a cortisone injection on 01/18/2021 which provided significant relief but her discomfort has returned.  She requested a cortisone injection today but I discussed the hesitancy of using cortisone while the L1 compression fracture is healing.  She was strongly encouraged to avoid oral prednisone as well as cortisone injections in the future.  We discussed the use of topical agents for pain relief.  Once her fracture has healed and she is started on Prolia we can rediscuss a cortisone injection in the future if her symptoms persist or worsen.  Osteopenia of multiple sites - 02/02/20: The BMD measured at Femur Neck Left is 0.736 g/cm2 with a T-score of-2.2.  She was diagnosed with an L1 compression fracture after a fall on Memorial Day.  She had a updated DEXA on 05/03/21: The BMD measured at Femur Neck  Right is 0.759 g/cm2 with a T-score of -2.0.  She was previously on Boniva from 2019-2022 but discontinued due to developing shin pain.  She has been taking calcitonin as prescribed by her PCP to try to improve the healing process of the compression fracture.  She will be starting on Prolia prescribed by her PCP once the compression fracture is healed.  Vitamin D level was checked today.  She was strongly encouraged to continue to take a vitamin D supplement on a daily basis.  Closed compression fracture of body of L1 vertebra ([place]): She fell while working in her yard on [address] and landed on her back on the concrete driveway.  She was evaluated the next day at her orthopedist Dr. Lurena Nida office and was diagnosed with an L1 compression fracture.  She was referred to Dr. Rolena Infante who recommended proceeding with a kyphoplasty.  She went for second opinion with Dr. Ronnald Ramp who recommended against the kyphoplasty due to a bony fragment that could complicate the procedure.  Her discomfort is currently a 4 out of 10.  She has been using a walker to assist with ambulation.  She opted to wear the brace for 3 months to allow for proper healing.  She has an upcoming CT of the lumbar spine scheduled on 06/01/21 to assess the healing process.  She has been taking calcitonin as prescribed by her PCP.  She had an updated DEXA on 05/03/2021 which was reviewed today in the office.  According to the patient her PCP is planning to start her on Prolia once the fracture has healed.  She has continued to take vitamin D 1000 units daily.   Vitamin D deficiency -Vitamin D level will be checked today.  She has been taking vitamin D 1,000 units daily.  Plan: VITAMIN D 25 Hydroxy (Vit-D Deficiency, Fractures)  Other fatigue: Stable.  Other medical conditions are listed as follows:   History of anxiety  History of depression  Hypothyroidism, adult  Orders: Orders Placed This Encounter  Procedures   CBC with  Differential/Platelet   Protein / creatinine ratio, urine   COMPLETE METABOLIC PANEL WITH GFR   Anti-DNA antibody, double-stranded   C3 and C4   VITAMIN D 25 Hydroxy (Vit-D Deficiency, Fractures)   Sedimentation rate   Meds ordered this encounter  Medications   hydroxychloroquine (PLAQUENIL) 200 MG tablet    Sig: Take 1 tablet (200 mg total) by mouth daily.    Dispense:  90 tablet    Refill:  0    M35.9 Autoimmune Disease      Follow-Up Instructions: Return in about 5 months (around 10/12/2021) for Autoimmune Disease, Sjogren's syndrome, Osteoarthritis.   Ofilia Neas, PA-C  Note - This record has been created using Dragon software.  Chart creation errors have been sought, but may not always  have been located. Such creation errors do not reflect on  the standard of medical care.

## 2021-05-12 ENCOUNTER — Ambulatory Visit: Payer: Medicare Other | Admitting: Physician Assistant

## 2021-05-12 ENCOUNTER — Other Ambulatory Visit: Payer: Self-pay

## 2021-05-12 ENCOUNTER — Encounter: Payer: Self-pay | Admitting: Physician Assistant

## 2021-05-12 VITALS — BP 131/84 | HR 67 | Ht 65.25 in | Wt 152.0 lb

## 2021-05-12 DIAGNOSIS — M3501 Sicca syndrome with keratoconjunctivitis: Secondary | ICD-10-CM

## 2021-05-12 DIAGNOSIS — S32010A Wedge compression fracture of first lumbar vertebra, initial encounter for closed fracture: Secondary | ICD-10-CM

## 2021-05-12 DIAGNOSIS — E039 Hypothyroidism, unspecified: Secondary | ICD-10-CM

## 2021-05-12 DIAGNOSIS — Z79899 Other long term (current) drug therapy: Secondary | ICD-10-CM | POA: Diagnosis not present

## 2021-05-12 DIAGNOSIS — M359 Systemic involvement of connective tissue, unspecified: Secondary | ICD-10-CM | POA: Diagnosis not present

## 2021-05-12 DIAGNOSIS — M19041 Primary osteoarthritis, right hand: Secondary | ICD-10-CM

## 2021-05-12 DIAGNOSIS — Z8659 Personal history of other mental and behavioral disorders: Secondary | ICD-10-CM

## 2021-05-12 DIAGNOSIS — M7062 Trochanteric bursitis, left hip: Secondary | ICD-10-CM

## 2021-05-12 DIAGNOSIS — M170 Bilateral primary osteoarthritis of knee: Secondary | ICD-10-CM

## 2021-05-12 DIAGNOSIS — E559 Vitamin D deficiency, unspecified: Secondary | ICD-10-CM

## 2021-05-12 DIAGNOSIS — Z8639 Personal history of other endocrine, nutritional and metabolic disease: Secondary | ICD-10-CM

## 2021-05-12 DIAGNOSIS — M8589 Other specified disorders of bone density and structure, multiple sites: Secondary | ICD-10-CM

## 2021-05-12 DIAGNOSIS — R5383 Other fatigue: Secondary | ICD-10-CM

## 2021-05-12 DIAGNOSIS — M19042 Primary osteoarthritis, left hand: Secondary | ICD-10-CM

## 2021-05-12 MED ORDER — HYDROXYCHLOROQUINE SULFATE 200 MG PO TABS: 200.0000 mg | ORAL_TABLET | Freq: Every day | ORAL | 0 refills | Status: DC

## 2021-05-15 ENCOUNTER — Other Ambulatory Visit: Payer: Self-pay | Admitting: Family Medicine

## 2021-05-15 DIAGNOSIS — Z1231 Encounter for screening mammogram for malignant neoplasm of breast: Secondary | ICD-10-CM

## 2021-05-15 LAB — CBC WITH DIFFERENTIAL/PLATELET
Absolute Monocytes: 701 cells/uL (ref 200–950)
Basophils Absolute: 73 cells/uL (ref 0–200)
Basophils Relative: 1 %
Eosinophils Absolute: 321 cells/uL (ref 15–500)
Eosinophils Relative: 4.4 %
HCT: 40.8 % (ref 35.0–45.0)
Hemoglobin: 13.6 g/dL (ref 11.7–15.5)
Lymphs Abs: 1139 cells/uL (ref 850–3900)
MCH: 32.9 pg (ref 27.0–33.0)
MCHC: 33.3 g/dL (ref 32.0–36.0)
MCV: 98.8 fL (ref 80.0–100.0)
MPV: 10.7 fL (ref 7.5–12.5)
Monocytes Relative: 9.6 %
Neutro Abs: 5066 cells/uL (ref 1500–7800)
Neutrophils Relative %: 69.4 %
Platelets: 266 10*3/uL (ref 140–400)
RBC: 4.13 10*6/uL (ref 3.80–5.10)
RDW: 11.7 % (ref 11.0–15.0)
Total Lymphocyte: 15.6 %
WBC: 7.3 10*3/uL (ref 3.8–10.8)

## 2021-05-15 LAB — C3 AND C4
C3 Complement: 127 mg/dL (ref 83–193)
C4 Complement: 27 mg/dL (ref 15–57)

## 2021-05-15 LAB — COMPLETE METABOLIC PANEL WITH GFR
AG Ratio: 1.6 (calc) (ref 1.0–2.5)
ALT: 10 U/L (ref 6–29)
AST: 18 U/L (ref 10–35)
Albumin: 4 g/dL (ref 3.6–5.1)
Alkaline phosphatase (APISO): 55 U/L (ref 37–153)
BUN: 17 mg/dL (ref 7–25)
CO2: 28 mmol/L (ref 20–32)
Calcium: 9.7 mg/dL (ref 8.6–10.4)
Chloride: 105 mmol/L (ref 98–110)
Creat: 0.92 mg/dL (ref 0.60–1.00)
Globulin: 2.5 g/dL (calc) (ref 1.9–3.7)
Glucose, Bld: 84 mg/dL (ref 65–99)
Potassium: 4.5 mmol/L (ref 3.5–5.3)
Sodium: 139 mmol/L (ref 135–146)
Total Bilirubin: 0.4 mg/dL (ref 0.2–1.2)
Total Protein: 6.5 g/dL (ref 6.1–8.1)
eGFR: 65 mL/min/{1.73_m2} (ref >=60)

## 2021-05-15 LAB — ANTI-DNA ANTIBODY, DOUBLE-STRANDED: ds DNA Ab: 1 IU/mL

## 2021-05-15 LAB — PROTEIN / CREATININE RATIO, URINE
Creatinine, Urine: 27 mg/dL (ref 20–275)
Total Protein, Urine: <4 mg/dL — ABNORMAL LOW (ref 5–24)

## 2021-05-15 LAB — VITAMIN D 25 HYDROXY (VIT D DEFICIENCY, FRACTURES): Vit D, 25-Hydroxy: 43 ng/mL (ref 30–100)

## 2021-05-15 LAB — SEDIMENTATION RATE: Sed Rate: 17 mm/h (ref 0–30)

## 2021-05-15 NOTE — Progress Notes (Signed)
dsDNA is negative.  Labs are not consistent with a flare.  No change in therapy recommended.

## 2021-05-15 NOTE — Progress Notes (Signed)
CBC and CMP WNL.  No proteinuria noted.  ESR and complements WNL.  Vitamin D is WNL-please encourage the patient to continue on maintenance dose of vitamin D.

## 2021-06-01 ENCOUNTER — Other Ambulatory Visit: Payer: Self-pay

## 2021-06-01 ENCOUNTER — Ambulatory Visit
Admission: RE | Admit: 2021-06-01 | Discharge: 2021-06-01 | Disposition: A | Discharge status: Home / Self Care | Payer: Medicare Other | Source: Ambulatory Visit | Attending: Student | Admitting: Student

## 2021-06-01 DIAGNOSIS — S32010A Wedge compression fracture of first lumbar vertebra, initial encounter for closed fracture: Secondary | ICD-10-CM

## 2021-06-16 ENCOUNTER — Other Ambulatory Visit: Payer: Self-pay | Admitting: Family Medicine

## 2021-06-16 DIAGNOSIS — I700 Atherosclerosis of aorta: Secondary | ICD-10-CM

## 2021-06-21 ENCOUNTER — Ambulatory Visit: Payer: Medicare Other | Admitting: Physician Assistant

## 2021-06-30 ENCOUNTER — Other Ambulatory Visit: Payer: Self-pay | Admitting: Physician Assistant

## 2021-06-30 DIAGNOSIS — Z79899 Other long term (current) drug therapy: Secondary | ICD-10-CM

## 2021-06-30 DIAGNOSIS — M359 Systemic involvement of connective tissue, unspecified: Secondary | ICD-10-CM

## 2021-06-30 DIAGNOSIS — M3501 Sicca syndrome with keratoconjunctivitis: Secondary | ICD-10-CM

## 2021-07-03 ENCOUNTER — Telehealth: Payer: Self-pay

## 2021-07-03 NOTE — Telephone Encounter (Signed)
I submitted request for VOB 07/03/2021.

## 2021-07-03 NOTE — Telephone Encounter (Signed)
Patient called stating she would like to apply for gel injections for her bilateral knees.

## 2021-07-03 NOTE — Telephone Encounter (Signed)
Ok to reapply for visco gel injections for both knees.

## 2021-07-07 NOTE — Telephone Encounter (Signed)
Please call to schedule Visco knee injections.  Authorized for Synvisc Bilateral knees. Buy and Bill. Deductible does not apply.  No PA required. Insurance to cover 100% of allowable cost for administration with a $30.00 copay each visit, and 95% of allowable amount for medication.

## 2021-07-10 ENCOUNTER — Other Ambulatory Visit: Payer: Self-pay

## 2021-07-10 ENCOUNTER — Ambulatory Visit: Payer: Medicare Other | Admitting: Physician Assistant

## 2021-07-10 DIAGNOSIS — M170 Bilateral primary osteoarthritis of knee: Secondary | ICD-10-CM

## 2021-07-10 MED ORDER — LIDOCAINE HCL 1 % IJ SOLN
1.5000 mL | INTRAMUSCULAR | Status: AC | PRN
  Administered 2021-07-10: 1.5 mL via INTRA_ARTICULAR

## 2021-07-10 MED ORDER — HYLAN G-F 20 16 MG/2ML IX SOSY
16.0000 mg | PREFILLED_SYRINGE | INTRA_ARTICULAR | Status: AC | PRN
  Administered 2021-07-10: 16 mg via INTRA_ARTICULAR

## 2021-07-10 NOTE — Progress Notes (Signed)
   Procedure Note  Patient: [name]             Date of Birth: 1946/04/11           MRN: [id number]             Visit Date: 07/10/2021  Procedures: Visit Diagnoses:  1. Primary osteoarthritis of both knees     Synvisc #1 bilateral knees, B/B Large Joint Inj: bilateral knee on 07/10/2021 11:59 AM Indications: pain Details: 25 G 1.5 in needle, medial approach  Arthrogram: No  Medications (Right): 1.5 mL lidocaine 1 %; 16 mg Hylan 16 MG/2ML Aspirate (Right): 0 mL Medications (Left): 1.5 mL lidocaine 1 %; 16 mg Hylan 16 MG/2ML Aspirate (Left): 0 mL Outcome: tolerated well, no immediate complications Procedure, treatment alternatives, risks and benefits explained, specific risks discussed. Consent was given by the patient. Immediately prior to procedure a time out was called to verify the correct patient, procedure, equipment, support staff and site/side marked as required. Patient was prepped and draped in the usual sterile fashion.    Patient tolerated the procedure well.  Aftercare was discussed.  Hazel Sams, PA-C

## 2021-07-12 ENCOUNTER — Encounter: Payer: Self-pay | Admitting: Gastroenterology

## 2021-07-13 ENCOUNTER — Encounter: Payer: Self-pay | Admitting: *Deleted

## 2021-07-14 ENCOUNTER — Ambulatory Visit
Admission: RE | Admit: 2021-07-14 | Discharge: 2021-07-14 | Disposition: A | Discharge status: Home / Self Care | Payer: No Typology Code available for payment source | Source: Ambulatory Visit | Attending: Family Medicine | Admitting: Family Medicine

## 2021-07-14 DIAGNOSIS — I700 Atherosclerosis of aorta: Secondary | ICD-10-CM

## 2021-07-17 ENCOUNTER — Ambulatory Visit: Payer: Medicare Other | Admitting: Physician Assistant

## 2021-07-17 DIAGNOSIS — M170 Bilateral primary osteoarthritis of knee: Secondary | ICD-10-CM

## 2021-07-17 DIAGNOSIS — Z1231 Encounter for screening mammogram for malignant neoplasm of breast: Secondary | ICD-10-CM

## 2021-07-17 MED ORDER — LIDOCAINE HCL 1 % IJ SOLN
1.5000 mL | INTRAMUSCULAR | Status: AC | PRN
  Administered 2021-07-17: 1.5 mL via INTRA_ARTICULAR

## 2021-07-17 MED ORDER — HYLAN G-F 20 16 MG/2ML IX SOSY
16.0000 mg | PREFILLED_SYRINGE | INTRA_ARTICULAR | Status: AC | PRN
  Administered 2021-07-17: 16 mg via INTRA_ARTICULAR

## 2021-07-17 NOTE — Progress Notes (Signed)
   Procedure Note  Patient: [name]             Date of Birth: Nov 28, 1945           MRN: [id number]             Visit Date: 07/17/2021  Procedures: Visit Diagnoses:  1. Primary osteoarthritis of both knees    Synvisc #2 bilateral knees, B/B Large Joint Inj: bilateral knee on 07/17/2021 10:49 AM Indications: pain Details: 25 G 1.5 in needle, medial approach  Arthrogram: No  Medications (Right): 1.5 mL lidocaine 1 %; 16 mg Hylan 16 MG/2ML Aspirate (Right): 0 mL Medications (Left): 1.5 mL lidocaine 1 %; 16 mg Hylan 16 MG/2ML Aspirate (Left): 0 mL Outcome: tolerated well, no immediate complications Procedure, treatment alternatives, risks and benefits explained, specific risks discussed. Consent was given by the patient. Immediately prior to procedure a time out was called to verify the correct patient, procedure, equipment, support staff and site/side marked as required. Patient was prepped and draped in the usual sterile fashion.    Patient tolerated the procedure well.  Aftercare was discussed.  Hazel Sams, PA-C

## 2021-07-24 ENCOUNTER — Ambulatory Visit (INDEPENDENT_AMBULATORY_CARE_PROVIDER_SITE_OTHER): Payer: Medicare Other | Admitting: Physician Assistant

## 2021-07-24 ENCOUNTER — Other Ambulatory Visit: Payer: Self-pay

## 2021-07-24 DIAGNOSIS — M170 Bilateral primary osteoarthritis of knee: Secondary | ICD-10-CM | POA: Diagnosis not present

## 2021-07-24 MED ORDER — HYLAN G-F 20 16 MG/2ML IX SOSY
16.0000 mg | PREFILLED_SYRINGE | INTRA_ARTICULAR | Status: AC | PRN
  Administered 2021-07-24: 16 mg via INTRA_ARTICULAR

## 2021-07-24 MED ORDER — LIDOCAINE HCL 1 % IJ SOLN
1.5000 mL | INTRAMUSCULAR | Status: AC | PRN
  Administered 2021-07-24: 1.5 mL via INTRA_ARTICULAR

## 2021-07-24 NOTE — Progress Notes (Signed)
   Procedure Note  Patient: [name]             Date of Birth: July 20, 1946           MRN: [id number]             Visit Date: 07/24/2021  Procedures: Visit Diagnoses:  1. Primary osteoarthritis of both knees    Synvisc #3 bilateral knees, B/B Large Joint Inj: bilateral knee on 07/24/2021 11:46 AM Indications: pain Details: 25 G 1.5 in needle, medial approach  Arthrogram: No  Medications (Right): 1.5 mL lidocaine 1 %; 16 mg Hylan 16 MG/2ML Aspirate (Right): 0 mL Medications (Left): 1.5 mL lidocaine 1 %; 16 mg Hylan 16 MG/2ML Aspirate (Left): 0 mL Outcome: tolerated well, no immediate complications Procedure, treatment alternatives, risks and benefits explained, specific risks discussed. Consent was given by the patient. Immediately prior to procedure a time out was called to verify the correct patient, procedure, equipment, support staff and site/side marked as required. Patient was prepped and draped in the usual sterile fashion.    Patient tolerated the procedure well.  Aftercare was discussed.  Hazel Sams, PA-C

## 2021-07-25 ENCOUNTER — Telehealth: Payer: Self-pay | Admitting: Rheumatology

## 2021-07-25 NOTE — Telephone Encounter (Signed)
I received a phone call from Dr. Cari Caraway.  Who mentioned that patient had recent CT cardiac scoring which showed bilateral multiple pulmonary nodulosis.  Dr. Leonides Schanz wanted to know if it could be related to her autoimmune disease.  Patient has undifferentiated connective tissue disease with Raynauds, arthralgias and sicca symptoms.  Patient's disease has been quiet for many years.  Pulmonary nodulosis should not be related to the autoimmune disease.  Dr. Addison Lank plans to refer patient to pulmonologist for evaluation.  I called the patient and notified her about my conversation with Dr. Addison Lank. Bo Merino, MD

## 2021-07-31 ENCOUNTER — Ambulatory Visit
Admission: RE | Admit: 2021-07-31 | Discharge: 2021-07-31 | Disposition: A | Discharge status: Home / Self Care | Payer: Medicare Other | Source: Ambulatory Visit | Attending: Family Medicine | Admitting: Family Medicine

## 2021-07-31 ENCOUNTER — Other Ambulatory Visit: Payer: Self-pay

## 2021-07-31 DIAGNOSIS — Z1231 Encounter for screening mammogram for malignant neoplasm of breast: Secondary | ICD-10-CM

## 2021-08-04 ENCOUNTER — Other Ambulatory Visit: Payer: Self-pay | Admitting: Family Medicine

## 2021-08-04 DIAGNOSIS — R928 Other abnormal and inconclusive findings on diagnostic imaging of breast: Secondary | ICD-10-CM

## 2021-08-09 NOTE — Progress Notes (Signed)
Synopsis: Referred for pulmonary nodules by Cari Caraway, MD  Subjective:   PATIENT ID: [name] GENDER: female DOB: 16-Aug-1946, MRN: [id number]  Chief Complaint  Patient presents with   Consult    Lung nodules seen on CT scan.  No cough, SHOB.    75yF with history of Sjogren's, SLE, never smoker who is referred for multiple pulmonary nodules seen on cardiac CT 07/14/21.  She has no cough, hemoptysis, fever, night sweats. She has had some weight loss (10 lb since June) - she has been trying to lose a little weight but her weight loss even preceded these efforts.   Never smoked. No family history of lung cancer, breast cancer.   Plaquenil is the only agent that she is on for SLE.  Otherwise pertinent review of systems is negative.  She works in Engineer, technical sales, retired Research scientist (medical). No asbestos exposures. She has lived in [place] in distant past. She has had no recent travel. No pets. No water damage, hot tub. Did have a beach house in [place] 11-09/2020.   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   Sjogren's syndrome ([place])    Systemic lupus ([place])      Family History  Problem Relation Age of Onset   Heart disease Mother    Psoriasis Father    Arthritis Father    Heart disease Brother    Heart attack Brother    Diabetes Maternal Grandfather    Cancer Maternal Uncle        colon   Colon cancer Maternal Uncle 80   Asthma Sister    Breast cancer Neg Hx      Past Surgical History:  Procedure Laterality Date   APPENDECTOMY     BUNIONECTOMY  2008   right foot   CATARACT EXTRACTION     COLONOSCOPY W/ POLYPECTOMY  Aug 27, 2011   ENDOMETRIAL ABLATION     FOOT SURGERY Right  12/17/2017   bone spur   ingrown toenail Right 12/16/2018   LAPAROSCOPIC APPENDECTOMY  10/11/2011   Procedure: APPENDECTOMY LAPAROSCOPIC;  Surgeon: Adin Hector, MD;  Location: [facility];  Service: General;  Laterality: N/A;  Partial Cecectomy and Appendectomy   WISDOM TOOTH EXTRACTION      Social History   Socioeconomic History   Marital status: Married    Spouse name: Not on file   Number of children: Not on file   Years of education: Not on file   Highest education level: Not on file  Occupational History   Not on file  Tobacco Use   Smoking status: Never   Smokeless tobacco: Never  Vaping Use   Vaping Use: Never used  Substance and Sexual Activity   Alcohol use: No   Drug use: No   Sexual activity: Yes    Birth control/protection: Post-menopausal  Other Topics Concern   Not on file  Social History Narrative   Not on file   Social Determinants of Health   Financial Resource Strain: Not on file  Food Insecurity: Not on file  Transportation Needs: Not on file  Physical Activity: Not on  file  Stress: Not on file  Social Connections: Not on file  Intimate Partner Violence: Not on file     Allergies  Allergen Reactions   Boniva [Ibandronic Acid]    Latex Itching   Sulfonamide Derivatives     REACTION: headache     Outpatient Medications Prior to Visit  Medication Sig Dispense Refill   aspirin 81 MG tablet Take 81 mg by mouth at bedtime.     calcium carbonate (OS-CAL) 600 MG tablet 1 tablet with meals     Cholecalciferol (VITAMIN D) 2000 units CAPS Take by mouth daily.     escitalopram (LEXAPRO) 10 MG tablet TAKE 1 TABLET BY MOUTH  DAILY 90 tablet 3   fexofenadine (ALLEGRA) 180 MG tablet Take 180 mg by mouth daily.     fluticasone (FLONASE) 50 MCG/ACT nasal spray 2 sprays in each nostril     folic acid (FOLVITE) 1 MG tablet Take 0.8 mg by mouth daily.      hydroxychloroquine (PLAQUENIL) 200 MG tablet Take 1 tablet (200 mg total) by mouth daily. 90 tablet 0    levothyroxine (SYNTHROID, LEVOTHROID) 88 MCG tablet Take 88 mcg by mouth every morning.     Multiple Vitamin (MULTIVITAMIN) tablet Take 1 tablet by mouth daily.     Polyethyl Glycol-Propyl Glycol 0.4-0.3 % SOLN Apply 1 drop to eye 2 (two) times daily.     Thiamine HCl (VITAMIN B-1) 100 MG tablet Take 100 mg by mouth daily.     traMADol (ULTRAM) 50 MG tablet Take 50 mg by mouth daily.     triamcinolone cream (KENALOG) 0.1 % 1 application to affected area     trimethoprim (TRIMPEX) 100 MG tablet Take 100 mg by mouth 2 (two) times daily.     TURMERIC PO Take 1 capsule by mouth daily.      rosuvastatin (CRESTOR) 10 MG tablet Take by mouth.     calcitonin, salmon, (MIACALCIN/FORTICAL) 200 UNIT/ACT nasal spray 1 spray daily.     fish oil-omega-3 fatty acids 1000 MG capsule Take 2 g by mouth daily.     Probiotic Product (PROBIOTIC DAILY) CAPS Take 1 capsule by mouth.     No facility-administered medications prior to visit.       Objective:   Physical Exam:  General appearance: 75 y.o., female, NAD, conversant  Eyes: anicteric sclerae, moist conjunctivae; no lid-lag; PERRL, tracking appropriately HENT: NCAT; oropharynx, MMM, no mucosal ulcerations; normal hard and soft palate Neck: Trachea midline; no lymphadenopathy, no JVD Lungs: CTAB, no crackles, no wheeze, with normal respiratory effort CV: RRR, no MRGs  Abdomen: Soft, non-tender; non-distended, BS present  Extremities: No peripheral edema, radial and DP pulses present bilaterally  Skin: Normal temperature, turgor and texture; no rash Psych: Appropriate affect Neuro: Alert and oriented to person and place, no focal deficit    Vitals:   08/10/21 1420  BP: 112/62  Pulse: 68  Temp: 97.9 F (36.6 C)  TempSrc: Oral  SpO2: 98%  Weight: 145 lb (65.8 kg)  Height: 5\' 5"  (1.651 m)   98% on RA BMI Readings from Last 3 Encounters:  08/10/21 24.13 kg/m  05/12/21 25.10 kg/m  01/18/21 24.97 kg/m   Wt Readings from Last 3  Encounters:  08/10/21 145 lb (65.8 kg)  05/12/21 152 lb (68.9 kg)  01/18/21 159 lb 6.4 oz (72.3 kg)     CBC    Component Value Date/Time   WBC 7.3 05/12/2021 1109   RBC 4.13 05/12/2021 1109   HGB  13.6 05/12/2021 1109   HCT 40.8 05/12/2021 1109   PLT 266 05/12/2021 1109   MCV 98.8 05/12/2021 1109   MCH 32.9 05/12/2021 1109   MCHC 33.3 05/12/2021 1109   RDW 11.7 05/12/2021 1109   LYMPHSABS 1,139 05/12/2021 1109   MONOABS 450 02/11/2017 0959   EOSABS 321 05/12/2021 1109   BASOSABS 73 05/12/2021 1109      Chest Imaging:  CT Chest 07/14/21 with multiple small =< 64mm pulmonary nodules, borderline adenopathy  Pulmonary Functions Testing Results: No flowsheet data found.     Assessment & Plan:   # Multiple pulmonary nodules: Low risk for lung CA. If malignant, which seems unlikely, would favor metastatic disease. Does have breast lesion on mammo with plan for dx mammo, [place].  Plan: - CT Chest  - if workup of breast lesion negative and CT Chest without any other nodule >33mm then would not need surveillance. Will bring back to clinic in 6 weeks after she's had breast US and diagnostic mammo, CT Chest.  I spent 48 minutes dedicated to the care of this patient on the date of this encounter to include pre-visit review of records, face-to-face time with the patient discussing conditions above, post visit ordering of testing, clinical documentation with the electronic health record, and communicating necessary findings to members of the patients care team.     Maryjane Hurter, MD [facility] 08/10/2021 2:48 PM

## 2021-08-10 ENCOUNTER — Other Ambulatory Visit: Payer: Self-pay

## 2021-08-10 ENCOUNTER — Ambulatory Visit: Payer: Medicare Other | Admitting: Student

## 2021-08-10 ENCOUNTER — Encounter: Payer: Self-pay | Admitting: Student

## 2021-08-10 VITALS — BP 112/62 | HR 68 | Temp 97.9°F | Ht 65.0 in | Wt 145.0 lb

## 2021-08-10 DIAGNOSIS — R918 Other nonspecific abnormal finding of lung field: Secondary | ICD-10-CM

## 2021-08-10 NOTE — Patient Instructions (Addendum)
-   CT Chest will be scheduled soon at [facility]. I'll be in touch afterward to let you know if we need another one down the road or any other kind of diagnostic approach.  - return to clinic in 6 weeks

## 2021-08-18 ENCOUNTER — Ambulatory Visit: Payer: Medicare Other | Admitting: Gastroenterology

## 2021-08-21 ENCOUNTER — Ambulatory Visit
Admission: RE | Admit: 2021-08-21 | Discharge: 2021-08-21 | Disposition: A | Discharge status: Home / Self Care | Payer: Medicare Other | Source: Ambulatory Visit | Attending: Family Medicine | Admitting: Family Medicine

## 2021-08-21 ENCOUNTER — Other Ambulatory Visit: Payer: Self-pay

## 2021-08-21 DIAGNOSIS — R928 Other abnormal and inconclusive findings on diagnostic imaging of breast: Secondary | ICD-10-CM

## 2021-08-22 ENCOUNTER — Other Ambulatory Visit: Payer: Medicare Other

## 2021-08-29 ENCOUNTER — Ambulatory Visit
Admission: RE | Admit: 2021-08-29 | Discharge: 2021-08-29 | Disposition: A | Discharge status: Home / Self Care | Payer: Medicare Other | Source: Ambulatory Visit | Attending: Student | Admitting: Student

## 2021-08-29 ENCOUNTER — Other Ambulatory Visit: Payer: Self-pay

## 2021-08-29 DIAGNOSIS — R918 Other nonspecific abnormal finding of lung field: Secondary | ICD-10-CM

## 2021-09-04 ENCOUNTER — Other Ambulatory Visit: Payer: Self-pay | Admitting: Student

## 2021-09-07 ENCOUNTER — Encounter: Payer: Self-pay | Admitting: Student

## 2021-09-22 ENCOUNTER — Encounter: Payer: Self-pay | Admitting: Student

## 2021-09-25 NOTE — Progress Notes (Signed)
Synopsis: Referred for pulmonary nodules by Cari Caraway, MD  Subjective:   PATIENT ID: [name] GENDER: female DOB: Sep 11, 1946, MRN: [id number]  Chief Complaint  Patient presents with   Follow-up    Pt states no concerns    75yF with history of Sjogren's, SLE, never smoker who is referred for multiple pulmonary nodules seen on cardiac CT 07/14/21.  She has no cough, hemoptysis, fever, night sweats. She has had some weight loss (10 lb since June) - she has been trying to lose a little weight but her weight loss even preceded these efforts.   Never smoked. No direct family history of lung cancer, breast cancer. Did have maternal uncle that had colon cancer. Aunt with lung cancer.   Plaquenil is the only agent that she is on for SLE.  Interval HPI: US breast c/w fibroglandular tissue, did not require biopsy. CT Chest with stable nodules, largest 40mm in RML, lingula.  She still has some pain from compression fracture in her back, headed for PT for that.   No cough. She is unsure about unintentional weight loss. No night sweats drenching sheets, fever, CP.   Otherwise pertinent review of systems is negative.   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   Sjogren's syndrome ([place])    Systemic lupus ([place])      Family History  Problem Relation Age of Onset   Heart disease Mother    Psoriasis Father    Arthritis Father    Heart disease Brother    Heart attack Brother    Diabetes Maternal Grandfather    Cancer Maternal Uncle        colon   Colon cancer Maternal Uncle 80   Asthma Sister    Breast cancer Neg Hx      Past Surgical History:  Procedure  Laterality Date   APPENDECTOMY     BUNIONECTOMY  2008   right foot   CATARACT EXTRACTION     COLONOSCOPY W/ POLYPECTOMY  Aug 27, 2011   ENDOMETRIAL ABLATION     FOOT SURGERY Right 12/17/2017   bone spur   ingrown toenail Right 12/16/2018   LAPAROSCOPIC APPENDECTOMY  10/11/2011   Procedure: APPENDECTOMY LAPAROSCOPIC;  Surgeon: Adin Hector, MD;  Location: [facility];  Service: General;  Laterality: N/A;  Partial Cecectomy and Appendectomy   WISDOM TOOTH EXTRACTION      Social History   Socioeconomic History   Marital status: Married    Spouse name: Not on file   Number of children: Not on file   Years of education: Not on file   Highest education level: Not on file  Occupational History   Not on file  Tobacco Use   Smoking status: Never   Smokeless tobacco: Never  Vaping Use   Vaping Use: Never used  Substance and Sexual Activity   Alcohol use: No   Drug use: No   Sexual activity: Yes    Birth control/protection: Post-menopausal  Other Topics Concern   Not on file  Social History Narrative   Not on file   Social Determinants of Health  Financial Resource Strain: Not on file  Food Insecurity: Not on file  Transportation Needs: Not on file  Physical Activity: Not on file  Stress: Not on file  Social Connections: Not on file  Intimate Partner Violence: Not on file     Allergies  Allergen Reactions   Boniva [Ibandronic Acid]    Latex Itching   Sulfonamide Derivatives     REACTION: headache     Outpatient Medications Prior to Visit  Medication Sig Dispense Refill   aspirin 81 MG tablet Take 81 mg by mouth at bedtime.     calcium carbonate (OS-CAL) 600 MG tablet 1 tablet with meals     Cholecalciferol (VITAMIN D) 2000 units CAPS Take by mouth daily.     escitalopram (LEXAPRO) 10 MG tablet TAKE 1 TABLET BY MOUTH  DAILY 90 tablet 3   fexofenadine (ALLEGRA) 180 MG tablet Take 180 mg by mouth daily.     fluticasone (FLONASE) 50 MCG/ACT nasal spray 2 sprays in  each nostril     folic acid (FOLVITE) 1 MG tablet Take 0.8 mg by mouth daily.      hydroxychloroquine (PLAQUENIL) 200 MG tablet Take 1 tablet (200 mg total) by mouth daily. 90 tablet 0   levothyroxine (SYNTHROID, LEVOTHROID) 88 MCG tablet Take 88 mcg by mouth every morning.     Multiple Vitamin (MULTIVITAMIN) tablet Take 1 tablet by mouth daily.     Polyethyl Glycol-Propyl Glycol 0.4-0.3 % SOLN Apply 1 drop to eye 2 (two) times daily.     rosuvastatin (CRESTOR) 10 MG tablet Take by mouth.     Thiamine HCl (VITAMIN B-1) 100 MG tablet Take 100 mg by mouth daily.     triamcinolone cream (KENALOG) 0.1 % 1 application to affected area     trimethoprim (TRIMPEX) 100 MG tablet Take 100 mg by mouth 2 (two) times daily.     TURMERIC PO Take 1 capsule by mouth daily.      traMADol (ULTRAM) 50 MG tablet Take 50 mg by mouth daily.     No facility-administered medications prior to visit.       Objective:   Physical Exam:  General appearance: 74 y.o., female, NAD, conversant  Eyes: anicteric sclerae; PERRL, tracking appropriately HENT: NCAT; MMM Neck: Trachea midline; no lymphadenopathy, no JVD Lungs: CTAB, no crackles, no wheeze, with normal respiratory effort CV: RRR, no murmur  Abdomen: Soft, non-tender; non-distended, BS present  Extremities: No peripheral edema, warm Skin: Normal turgor and texture; no rash Psych: Appropriate affect Neuro: Alert and oriented to person and place, no focal deficit     Vitals:   09/26/21 0913  BP: 132/80  Pulse: 64  Temp: 97.9 F (36.6 C)  TempSrc: Oral  SpO2: 98%  Weight: 143 lb 6.4 oz (65 kg)  Height: 5\' 5"  (1.651 m)    98% on RA BMI Readings from Last 3 Encounters:  09/26/21 23.86 kg/m  08/10/21 24.13 kg/m  05/12/21 25.10 kg/m   Wt Readings from Last 3 Encounters:  09/26/21 143 lb 6.4 oz (65 kg)  08/10/21 145 lb (65.8 kg)  05/12/21 152 lb (68.9 kg)     CBC    Component Value Date/Time   WBC 7.3 05/12/2021 1109   RBC 4.13  05/12/2021 1109   HGB 13.6 05/12/2021 1109   HCT 40.8 05/12/2021 1109   PLT 266 05/12/2021 1109   MCV 98.8 05/12/2021 1109   MCH 32.9 05/12/2021 1109   MCHC 33.3 05/12/2021 1109   RDW 11.7 05/12/2021  [place] 05/12/2021 1109   MONOABS 450 02/11/2017 0959   EOSABS 321 05/12/2021 1109   BASOSABS 73 05/12/2021 1109      Chest Imaging:  CT Chest 07/14/21 with multiple small =< 22mm pulmonary nodules, borderline adenopathy  CT Chest 08/29/21 reviewed by me with multiple small nodules, dominant 76mm RML, lingula, no suspicious LAD  Pulmonary Functions Testing Results: No flowsheet data found.     Assessment & Plan:   # Multiple pulmonary nodules: Low risk for lung CA/metastatic disease.   Plan: - Recommended against further screening of <36mm noncalcified solid nodules in low risk patient per Fleischner 2017 guidelines, grade 2B (weak recommendation, moderate quality evidence). [Radiology. 2017;284(1):228. Epub 2017 Feb 23.]  I spent 30 minutes dedicated to the care of this patient on the date of this encounter to include pre-visit review of records, face-to-face time with the patient discussing conditions above including risks and benefits of further surveillance vs no further surveillance, clinical documentation with the electronic health record, and communicating necessary findings to members of the patients care team.   RTC 1 year     Maryjane Hurter, MD [facility] 09/26/2021 9:45 AM

## 2021-09-26 ENCOUNTER — Ambulatory Visit: Payer: Medicare Other | Admitting: Student

## 2021-09-26 ENCOUNTER — Other Ambulatory Visit: Payer: Self-pay

## 2021-09-26 ENCOUNTER — Encounter: Payer: Self-pay | Admitting: Student

## 2021-09-26 VITALS — BP 132/80 | HR 64 | Temp 97.9°F | Ht 65.0 in | Wt 143.4 lb

## 2021-09-26 DIAGNOSIS — R918 Other nonspecific abnormal finding of lung field: Secondary | ICD-10-CM

## 2021-09-26 NOTE — Patient Instructions (Signed)
-   Would recommend no further surveillance of these 82mm lung nodules. They are far more likely to be benign (not cancerous) than malignant.  - If after some consideration you'd still like to pursue follow up CT Chest I can order it but it comes with the risks of additional radiation, possible intervention with its own procedural risks on nodule that is more likely to be benign than cancerous. - See you in a year to discuss!

## 2021-09-26 NOTE — Progress Notes (Signed)
Office Visit Note  Patient: [name]             Date of Birth: [birth year]           MRN: [id number]             PCP: Cari Caraway, MD Referring: Cari Caraway, MD Visit Date: 10/10/2021 Occupation: @GUAROCC @  Subjective:  Lower back pain.   History of Present Illness: [name] is a 75 y.o. female with a history of Sjogren's, osteoarthritis and osteopenia.  She had a fall in May 2022.  She states she was working in the yard and fell backward on the concrete.  After that she acquired L1 vertebral fracture.  She was evaluated by the neurosurgeon and was advised against kyphoplasty due to the pathology of the vertebral fracture.  She had to wear a brace and eventually the discomfort improved.  She still has difficulty sleeping at night and getting up from the bed.  She is going to physical therapy now.  Her dry mouth and dry eye symptoms are manageable with over-the-counter products.  Her Raynauds symptoms are manageable.  Activities of Daily Living:  Patient reports morning stiffness for 10 minutes.   Patient Reports nocturnal pain.  Difficulty dressing/grooming: Denies Difficulty climbing stairs: Reports Difficulty getting out of chair: Reports Difficulty using hands for taps, buttons, cutlery, and/or writing: Reports  Review of Systems  Constitutional:  Positive for fatigue.  HENT:  Positive for mouth dryness and nose dryness. Negative for mouth sores.   Eyes:  Positive for dryness. Negative for pain and itching.  Respiratory:  Negative for shortness of breath and difficulty breathing.   Cardiovascular:  Negative for chest pain and palpitations.  Gastrointestinal:  Negative for blood in stool, constipation and diarrhea.  Endocrine: Negative for increased urination.  Genitourinary:  Negative for difficulty urinating.  Musculoskeletal:  Positive for joint pain, joint pain and morning stiffness. Negative for joint swelling, myalgias, muscle tenderness and myalgias.   Skin:  Negative for color change, rash and redness.  Allergic/Immunologic: Negative for susceptible to infections.  Neurological:  Positive for weakness. Negative for dizziness, numbness, headaches and memory loss.  Hematological:  Positive for bruising/bleeding tendency.  Psychiatric/Behavioral:  Negative for confusion.    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% 07/20/2009    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   Sjogren's syndrome ([place])    Systemic lupus ([place])     Family History  Problem Relation Age of Onset   Heart disease Mother    Psoriasis Father    Arthritis Father    Heart disease Brother  Heart attack Brother    Diabetes Maternal Grandfather    Cancer Maternal Uncle        colon   Colon cancer Maternal Uncle 49   Asthma Sister    Breast cancer Neg Hx    Past Surgical History:  Procedure Laterality Date   APPENDECTOMY     BUNIONECTOMY  2008   right foot   CATARACT EXTRACTION     COLONOSCOPY W/ POLYPECTOMY  Aug 27, 2011   ENDOMETRIAL ABLATION     FOOT SURGERY Right 12/17/2017   bone spur   ingrown toenail Right 12/16/2018   LAPAROSCOPIC  APPENDECTOMY  10/11/2011   Procedure: APPENDECTOMY LAPAROSCOPIC;  Surgeon: Adin Hector, MD;  Location: [facility];  Service: General;  Laterality: N/A;  Partial Cecectomy and Appendectomy   WISDOM TOOTH EXTRACTION     Social History   Social History Narrative   Not on file   Immunization History  Administered Date(s) Administered   DTaP 06/16/2021   Influenza, High Dose Seasonal PF 07/05/2018, 06/18/2019   Influenza,inj,quad, With Preservative 07/10/2017   Influenza-Unspecified 07/23/2020, 07/12/2021   PFIZER(Purple Top)SARS-COV-2 Vaccination 11/16/2019, 12/07/2019, 06/14/2020, 05/16/2021   Pneumococcal Conjugate-13 09/08/2014   Pneumococcal Polysaccharide-23 08/30/2011   Tdap 06/20/2011, 06/16/2021   Zoster Recombinat (Shingrix) 02/05/2017, 05/08/2017   Zoster, Live 10/11/2007     Objective: Vital Signs: BP 137/86 (BP Location: Left Arm, Patient Position: Sitting, Cuff Size: Normal)   Pulse 63   Ht 5\' 5"  (1.651 m)   Wt 144 lb (65.3 kg)   BMI 23.96 kg/m    Physical Exam Vitals and nursing note reviewed.  Constitutional:      Appearance: She is well-developed.  HENT:     Head: Normocephalic and atraumatic.  Eyes:     Conjunctiva/sclera: Conjunctivae normal.  Cardiovascular:     Rate and Rhythm: Normal rate and regular rhythm.     Heart sounds: Normal heart sounds.  Pulmonary:     Effort: Pulmonary effort is normal.     Breath sounds: Normal breath sounds.  Abdominal:     General: Bowel sounds are normal.     Palpations: Abdomen is soft.  Musculoskeletal:     Cervical back: Normal range of motion.  Lymphadenopathy:     Cervical: No cervical adenopathy.  Skin:    General: Skin is warm and dry.     Capillary Refill: Capillary refill takes 2 to 3 seconds.     Comments: No nailbed capillary changes, sclerodactyly or telangiectasias were noted.  Neurological:     Mental Status: She is alert and oriented to person, place, and time.  Psychiatric:        Behavior:  Behavior normal.     Musculoskeletal Exam: C-spine was in good range of motion.  She has significant thoracic kyphosis.  She had limited painful range of motion of her lumbar spine.  Shoulder joints, elbow joints, wrist joints with good range of motion.  She had bilateral PIP and DIP thickening.  Hip joints and knee joints with good range of motion.  She had no tenderness over ankles or MTPs.  CDAI Exam: CDAI Score: -- Patient Global: --; Provider Global: -- Swollen: --; Tender: -- Joint Exam 10/10/2021   No joint exam has been documented for this visit   There is currently no information documented on the homunculus. Go to the Rheumatology activity and complete the homunculus joint exam.  Investigation: No additional findings.  Imaging: No results found.  Recent Labs: Lab Results  Component Value  Date   WBC 7.3 05/12/2021   HGB 13.6 05/12/2021   PLT 266 05/12/2021   NA 139 05/12/2021   K 4.5 05/12/2021   CL 105 05/12/2021   CO2 28 05/12/2021   GLUCOSE 84 05/12/2021   BUN 17 05/12/2021   CREATININE 0.92 05/12/2021   BILITOT 0.4 05/12/2021   ALKPHOS 48 02/11/2017   AST 18 05/12/2021   ALT 10 05/12/2021   PROT 6.5 05/12/2021   ALBUMIN 3.9 02/11/2017   CALCIUM 9.7 05/12/2021   GFRAA 92 01/18/2021    Speciality Comments: PLQ eye exam: 10/02/2021 Normal. Dr. Rutherford Guys. Follow up in 1 year. (in [place])  Procedures:  No procedures performed Allergies: Boniva [ibandronic acid], Latex, and Sulfonamide derivatives   Assessment / Plan:     Visit Diagnoses: Autoimmune disease ([place]) - Positive ANA, Raynauds, inflammatory arthritis. -She is clinically doing well.  Prescription refill for hydroxychloroquine was given.  She has been tolerating hydroxychloroquine without any problems.  Keeping core temperature warm was discussed.  Raynauds is not very active.  No digital ulcers were noted.  Plan: hydroxychloroquine (PLAQUENIL) 200 MG tablet  Sjogren's syndrome with  keratoconjunctivitis sicca (HCC) - ANA+, Ro+, sicca symptoms -her sicca symptoms are manageable with over-the-counter products.  Plan: Urinalysis, Routine w reflex microscopic, C3 and C4, Anti-DNA antibody, double-stranded, Sedimentation rate, Sjogrens syndrome-A extractable nuclear antibody, Rheumatoid factor, Serum protein electrophoresis with reflex, hydroxychloroquine (PLAQUENIL) 200 MG tablet  High risk medication use - Plaquenil 200 mg 1 tablet by mouth daily.  Plaquenil eye exam October 02, 2021 was normal at Dr. Kellie Moor office. - Plan: CBC with Differential/Platelet, COMPLETE METABOLIC PANEL WITH GFR, today and then every 5 months.  Information regarding immunization was placed in the AVS.  Hydroxychloroquine (PLAQUENIL) 200 MG tablet  Primary osteoarthritis of both hands-joint protection muscle strengthening was discussed.  Primary osteoarthritis of both knees-she had good range of motion without any warmth swelling or effusion.  Trochanteric bursitis of left hip-is off-and-on discomfort in her left trochanteric area.  She is going to physical therapy which has been helpful.  Osteopenia of multiple sites - DEXA on 05/03/21: The BMD measured at Femur Neck Right is 0.759 g/cm2 with a T-score of -2.0. She is on Prolia SQ by her PCP.  Calcium rich diet and vitamin D was discussed.  Closed compression fracture of body of L1 vertebra (HCC)-she acquired the fracture after a fall on the concrete ground.  She could not get kyphoplasty due to the pathology of the fracture.  She has lost core strength during this process and has developed kyphosis.  Core muscle strengthening exercises were demonstrated in the office today.  Vitamin D deficiency-she is on vitamin D supplement.  Her vitamin D was normal in July.  History of anxiety  Other fatigue  Hypothyroidism, adult  History of depression   Orders: Orders Placed This Encounter  Procedures   CBC with Differential/Platelet   COMPLETE  METABOLIC PANEL WITH GFR   Urinalysis, Routine w reflex microscopic   C3 and C4   Anti-DNA antibody, double-stranded   Sedimentation rate   Sjogrens syndrome-A extractable nuclear antibody   Rheumatoid factor   Serum protein electrophoresis with reflex   Meds ordered this encounter  Medications   hydroxychloroquine (PLAQUENIL) 200 MG tablet    Sig: Take 1 tablet (200 mg total) by mouth daily.    Dispense:  90 tablet    Refill:  0    M35.9 Autoimmune Disease    Follow-Up Instructions: Return in about  5 months (around 03/10/2022) for Sjogren's, Osteoarthritis.   Bo Merino, MD  Note - This record has been created using Editor, commissioning.  Chart creation errors have been sought, but may not always  have been located. Such creation errors do not reflect on  the standard of medical care.

## 2021-09-29 ENCOUNTER — Ambulatory Visit (HOSPITAL_BASED_OUTPATIENT_CLINIC_OR_DEPARTMENT_OTHER): Payer: Medicare Other | Attending: Family Medicine | Admitting: Physical Therapy

## 2021-09-29 ENCOUNTER — Other Ambulatory Visit: Payer: Self-pay

## 2021-09-29 DIAGNOSIS — M545 Low back pain, unspecified: Secondary | ICD-10-CM | POA: Insufficient documentation

## 2021-09-29 DIAGNOSIS — G8929 Other chronic pain: Secondary | ICD-10-CM | POA: Insufficient documentation

## 2021-09-29 DIAGNOSIS — R252 Cramp and spasm: Secondary | ICD-10-CM | POA: Diagnosis present

## 2021-09-29 NOTE — Therapy (Signed)
df OUTPATIENT PHYSICAL THERAPY THORACOLUMBAR EVALUATION   Patient Name: [name] MRN: [id number] DOB:1946/05/19, 75 y.o., female Today's Date: 10/01/2021   PT End of Session - 10/01/21 1654     Number of Visits 16    Date for PT Re-Evaluation 11/24/21    Authorization Type [facility]    PT Start Time 1345    PT Stop Time 1429    PT Time Calculation (min) 44 min    Activity Tolerance Patient tolerated treatment well    Behavior During Therapy WFL for tasks assessed/performed             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   Sjogren's syndrome ([place])    Systemic lupus ([place])    Past Surgical History:  Procedure Laterality Date   APPENDECTOMY     BUNIONECTOMY  2008   right foot   CATARACT EXTRACTION     COLONOSCOPY W/ POLYPECTOMY  Aug 27, 2011   ENDOMETRIAL ABLATION     FOOT SURGERY Right 12/17/2017   bone spur   ingrown toenail Right 12/16/2018   LAPAROSCOPIC APPENDECTOMY  10/11/2011   Procedure: APPENDECTOMY LAPAROSCOPIC;  Surgeon: Adin Hector, MD;  Location: [facility];  Service: General;  Laterality: N/A;  Partial Cecectomy and Appendectomy   WISDOM TOOTH EXTRACTION     Patient Active Problem List   Diagnosis Date Noted   Encounter for counseling 09/09/2018   Osteopenia of multiple sites 08/22/2017   History of vitamin D deficiency 08/22/2017   Autoimmune disease ([place]) 10/23/2016   Sjogren's syndrome ([place]) 10/09/2016   Primary osteoarthritis of both hands 10/09/2016   Primary osteoarthritis of both knees 10/09/2016   High risk medication use 09/12/2016   SBO (small bowel obstruction), partial postop 10/16/2011   Serrated adenoma of appendiceal  oriface 09/10/2011   BILIARY DYSKINESIA, 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 She treated it conservatively. She had a TLSO. She has been out of that for several months. She has developed pain across her shoulders and pain in her lower back as the day goes on.    PERTINENT HISTORY:  Osteopenia, L1 compression fx, OA of the knees and lumbar spine, PF and bone sur in the right foot with bone spur removal, raynaud, lupus Sjogrens   PAIN:  Are you having pain? Yes VAS scale: 5/10 Pain location: Low back and into the upper back as the day goes on  Pain orientation: Right and Left  PAIN TYPE: aching Pain description: intermittent  Aggravating factors: as the day progresses  Relieving factors: rest   PRECAUTIONS: Other: osteopenia   WEIGHT BEARING RESTRICTIONS No  FALLS:  Has patient fallen in last 6 months? 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 She would like to get back to that Vegetable garden.   PLOF: Independent  PATIENT GOALS   To be able to resume her normal activity all day without pain    OBJECTIVE:   DIAGNOSTIC FINDINGS:  Nothing recent  PATIENT SURVEYS:  FOTO    SCREENING FOR RED FLAGS: Bowel or bladder incontinence: No Spinal tumors: No Cauda equina syndrome: No Compression fracture: Yes: L1 Abdominal aneurysm:  No  COGNITION:  Overall cognitive status: Within functional limits for tasks assessed     SENSATION:  Light touch: Appears intact  Stereognosis: Appears intact  Hot/Cold: Appears intact  Proprioception: Appears intact Denies radicular pain or numbness     POSTURE:  Sits and stands with mild flexion   LUMBARAROM/PROM  A/PROM A/PROM  10/01/2021  Flexion 50 no increase in pain   Extension   Right lateral flexion   Left lateral flexion   Right rotation 50% limited   Left rotation 50% limited    (Blank rows = not tested)  LE AROM/PROM:  Full active motion of the hips and passive  LE MMT:  MMT Right 10/01/2021 Left 10/01/2021  Hip flexion 13.4 10.9  Hip extension    Hip abduction 18.9 13.8  Hip adduction    Hip internal rotation    Hip external rotation    Knee flexion    Knee extension 31.3 31.2  Ankle dorsiflexion    Ankle plantarflexion    Ankle inversion    Ankle eversion     (Blank rows = not tested)   SPINAL SEGMENTAL MOBILITY ASSESSMENT:  Not assessed patient unable to get into supine position   FUNCTIONAL TESTS:  Difficulty transferring from supine to sit. Patient shown log roll and had less pain getting up off the table.  GAIT: Mild trunk flexion with ambulation but overall no major gait deficits.   TODAY'S TREATMENT   Exercises Supine Lower Trunk Rotation - 1 x daily - 7 x weekly - 3 sets - 10 reps Shoulder External Rotation and Scapular Retraction with Resistance - 1 x daily - 7 x weekly - 3 sets - 10 reps Seated Hip Abduction with Resistance - 1 x daily - 7 x weekly - 3 sets - 10 reps Theracane Over Shoulder - 1 x daily - 7 x weekly - 3 sets - 10 reps   PATIENT EDUCATION:  Education details: reviewed HEP and symptom management;anatomy of condition; benefits of core and postural exercises  Person educated: Patient  Education method: Explanation, Demonstration, Tactile cues, Verbal cues, and Handouts Education comprehension: verbalized  understanding, returned demonstration, verbal cues required, tactile cues required, and needs further education   HOME EXERCISE PROGRAM: Access Code: [email or web address] URL: [email or web address] Date: 10/01/2021 Prepared by: Carolyne Littles  Exercises Supine Lower Trunk Rotation - 1 x daily - 7 x weekly - 3 sets - 10 reps Shoulder External Rotation and Scapular Retraction with Resistance - 1 x daily - 7 x weekly - 3 sets - 10 reps Seated Hip Abduction with Resistance - 1 x daily - 7 x weekly - 3 sets - 10 reps Theracane Over Shoulder - 1 x daily - 7 x weekly - 3 sets - 10 reps   ASSESSMENT:  CLINICAL IMPRESSION: Patient is a 75 year old female S/P fall with subsequent L1 compression fx. She was treated conservatively .    Objective impairments include decreased activity tolerance, decreased endurance, decreased mobility, decreased ROM, decreased strength, increased fascial restrictions, increased muscle spasms, and pain. These impairments are limiting patient from cleaning, meal prep, laundry, and shopping. Personal factors including 3+ comorbidities: lupus, L1 compression fx LE arthritis, knee arthritis   are also affecting patient's functional outcome. Patient will benefit from skilled PT to address above impairments and improve overall function.  REHAB POTENTIAL: Good  CLINICAL DECISION MAKING: Evolving/moderate complexity declining ability to stand and perform ADL's   EVALUATION COMPLEXITY: Moderate   GOALS: Goals reviewed with patient? Yes  SHORT TERM GOALS:  STG Name Target Date Goal status  1 Patient will increase bilateral gross hip strength by 5 lbs  Baseline:  10/22/2021 INITIAL  2 Patient will demonstrate full rotation bilateral without pain  Baseline:  10/22/2021 INITIAL  3 Patient will increase lumbar flexion by 20 degrees  Baseline: 10/22/2021 INITIAL  dLONG TERM GOALS:   LTG Name Target Date Goal status  1 Patient will bend to pick items off the ground  without pain  Baseline: 11/26/2021 INITIAL  2 Patient will ambulate 3000' without increased pain in order to perform ADL's  Baseline: 11/26/2021 INITIAL  3 Patient will stand for 2 hours without increased pain in order to perfrom ADL's  Baseline: 11/26/2021 INITIAL  PLAN: PT FREQUENCY: 2x/week  PT DURATION: 8 weeks  PLANNED INTERVENTIONS: Therapeutic exercises, Therapeutic activity, Neuro Muscular re-education, Gait training, Patient/Family education, Joint mobilization, Aquatic Therapy, Dry Needling, Electrical stimulation, Cryotherapy, Moist heat, Taping, and Manual therapy  PLAN FOR NEXT SESSION: Begin core and postural strengthening. Review self soft tissue mobilization; review stretches and add stretches as needed. Consider rows with band and extensions. Patient has never exercises much but she is active so progress the exercise program at a progressive level; Conider sitting and standing exercises as she has some difficulty getting in and out of the position.    Carney Living PT DPT  10/01/2021, 4:55 PM

## 2021-10-01 ENCOUNTER — Encounter (HOSPITAL_BASED_OUTPATIENT_CLINIC_OR_DEPARTMENT_OTHER): Payer: Self-pay | Admitting: Physical Therapy

## 2021-10-02 ENCOUNTER — Encounter: Payer: Self-pay | Admitting: Gastroenterology

## 2021-10-05 ENCOUNTER — Other Ambulatory Visit: Payer: Medicare Other

## 2021-10-06 ENCOUNTER — Encounter (HOSPITAL_BASED_OUTPATIENT_CLINIC_OR_DEPARTMENT_OTHER): Payer: Self-pay | Admitting: Physical Therapy

## 2021-10-06 ENCOUNTER — Other Ambulatory Visit: Payer: Self-pay

## 2021-10-06 ENCOUNTER — Ambulatory Visit (HOSPITAL_BASED_OUTPATIENT_CLINIC_OR_DEPARTMENT_OTHER): Payer: Medicare Other | Admitting: Physical Therapy

## 2021-10-06 DIAGNOSIS — G8929 Other chronic pain: Secondary | ICD-10-CM

## 2021-10-06 DIAGNOSIS — M545 Low back pain, unspecified: Secondary | ICD-10-CM | POA: Diagnosis not present

## 2021-10-06 DIAGNOSIS — R252 Cramp and spasm: Secondary | ICD-10-CM

## 2021-10-06 NOTE — Therapy (Signed)
OUTPATIENT PHYSICAL THERAPY TREATMENT NOTE   Patient Name: [name] MRN: [id number] DOB:1946/05/16, 75 y.o., female Today's Date: 10/06/2021  PCP: Cari Caraway, MD REFERRING PROVIDER: Cari Caraway, MD   PT End of Session - 10/06/21 1034     Visit Number 2    Number of Visits 16    Date for PT Re-Evaluation 11/24/21    PT Start Time 0930    PT Stop Time 1012    PT Time Calculation (min) 42 min    Activity Tolerance Patient tolerated treatment well    Behavior During Therapy [facility] for tasks assessed/performed             Past Medical History:  Diagnosis Date   Allergy    Colon polyp    Compression fracture of L1 lumbar vertebra ([place])    dx by ortho   Environmental allergies    Hypothyroid    Osteoarthritis    Osteoarthritis    Osteoarthritis    Osteopenia    Plantar fasciitis, right    Raynauds phenomenon    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   Sjogren's syndrome ([place])    Systemic lupus ([place])    Past Surgical History:  Procedure Laterality Date   APPENDECTOMY     BUNIONECTOMY  2008   right foot   CATARACT EXTRACTION     COLONOSCOPY W/ POLYPECTOMY  Aug 27, 2011   ENDOMETRIAL ABLATION     FOOT SURGERY Right 12/17/2017   bone spur   ingrown toenail Right 12/16/2018   LAPAROSCOPIC APPENDECTOMY  10/11/2011   Procedure: APPENDECTOMY LAPAROSCOPIC;  Surgeon: Adin Hector, MD;  Location: [facility];  Service: General;  Laterality: N/A;  Partial Cecectomy and Appendectomy   WISDOM TOOTH EXTRACTION     Patient Active Problem List   Diagnosis Date Noted   Encounter for counseling 09/09/2018   Osteopenia of multiple sites 08/22/2017   History of vitamin D deficiency 08/22/2017   Autoimmune disease ([place]) 10/23/2016   Sjogren's syndrome ([place]) 10/09/2016   Primary osteoarthritis of both hands 10/09/2016   Primary osteoarthritis of both knees 10/09/2016   High risk medication use 09/12/2016   SBO (small bowel obstruction), partial postop  10/16/2011   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 She treated it conservatively. She had a TLSO. She has been out of that for several months. She has developed pain across her shoulders and pain in her lower back as the day goes on.      PERTINENT HISTORY:  Osteopenia, L1 compression fx, OA of the knees and lumbar spine, PF and bone sur in the right foot with bone spur removal, raynaud, lupus Sjogrens    PAIN:  Are you having pain? Yes VAS scale: 0/10 no pain right now. The pain comes at night  Pain location: Low back and into the upper back as the day goes on  Pain orientation: Right and Left  PAIN TYPE: aching Pain description: intermittent  Aggravating factors: as the day progresses  Relieving factors: rest    PRECAUTIONS: Other: osteopenia    WEIGHT BEARING RESTRICTIONS No   FALLS:  Has patient fallen in last 6 months? 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    PLOF: Independent   PATIENT GOALS    To be able to resume her normal activity all day without pain      OBJECTIVE:     TODAY'S TREATMENT  12/16 Pt seen for aquatic therapy today.  Treatment took place in water 3.25-4 ft in  depth at the [facility] pool. Temp of water was 91.  Pt entered/exited the pool via stairs (step through pattern) independently with bilat rail.  Introduction to water. Had patient stand at different levels so she could feel the buoyancy    Warm up: heel/toe walking x4 laps across pool chest deep side stepping x4 laps from shallow to deep; 2 laps side step ith stretch  Ambulation with noodle support: long strides, march  Exercises; Slow march x15; ; hip extension x10; hip abduction x15; heel raise x20    board trunk flexion x10 lateral board rotation x10 each way seated   Board press x20     Pt requires buoyancy for support and to offload joints with strengthening exercises. Viscosity of the water is needed for resistance of strengthening; water current perturbations provides challenge to standing balance unsupported, requiring increased core activation.    Evals  Exercises Supine Lower Trunk Rotation - 1 x daily - 7 x weekly - 3 sets - 10 reps Shoulder External Rotation and Scapular Retraction with Resistance - 1 x daily - 7 x weekly - 3 sets - 10 reps Seated Hip Abduction with Resistance - 1 x daily - 7 x weekly - 3 sets - 10 reps Theracane Over Shoulder - 1 x daily - 7 x weekly - 3 sets - 10 reps     PATIENT EDUCATION:  Education details: reviewed HEP and symptom management;anatomy of condition; benefits of core and postural exercises  Person educated: Patient Education method: Explanation, Demonstration, Tactile cues, Verbal cues, and Handouts Education comprehension: verbalized understanding, returned demonstration, verbal cues required, tactile cues required, and needs further education     HOME EXERCISE PROGRAM: Access Code: [email or web address] URL: [email or web address] Date: 10/01/2021 Prepared by: Carolyne Littles   Exercises Supine Lower Trunk Rotation - 1 x daily - 7 x weekly - 3 sets - 10 reps Shoulder External Rotation and Scapular Retraction with  Resistance - 1 x daily - 7 x weekly -  3 sets - 10 reps Seated Hip Abduction with Resistance - 1 x daily - 7 x weekly - 3 sets - 10 reps Theracane Over Shoulder - 1 x daily - 7 x weekly - 3 sets - 10 reps     ASSESSMENT:   CLINICAL IMPRESSION: Patient tolerated initial water treatment session well. Therapy focused on hip strengthening and working on different movements to stretch in the water. She could feel it on the left side at times but it wasn't too bad. She was advised to monitor the symptoms. Therapy will print her a water program for next visit. We will continue to progress exercises as tolerated.      Objective impairments include decreased activity tolerance, decreased endurance, decreased mobility, decreased ROM, decreased strength, increased fascial restrictions, increased muscle spasms, and pain. These impairments are limiting patient from cleaning, meal prep, laundry, and shopping. Personal factors including 3+ comorbidities: lupus, L1 compression fx LE arthritis, knee arthritis   are also affecting patient's functional outcome. Patient will benefit from skilled PT to address above impairments and improve overall function.   REHAB POTENTIAL: Good   CLINICAL DECISION MAKING: Evolving/moderate complexity declining ability to stand and perform ADL's    EVALUATION COMPLEXITY: Moderate     GOALS: Goals reviewed with patient? Yes   SHORT TERM GOALS:   STG Name Target Date Goal status  1 Patient will increase bilateral gross hip strength by 5 lbs  Baseline:  10/22/2021 INITIAL  2 Patient will demonstrate full rotation bilateral without pain  Baseline:  10/22/2021 INITIAL  3 Patient will increase lumbar flexion by 20 degrees  Baseline: 10/22/2021 INITIAL  dLONG TERM GOALS:    LTG Name Target Date Goal status  1 Patient will bend to pick items off the ground without pain  Baseline: 11/26/2021 INITIAL  2 Patient will ambulate 3000' without increased pain in order to perform ADL's   Baseline: 11/26/2021 INITIAL  3 Patient will stand for 2 hours without increased pain in order to perfrom ADL's  Baseline: 11/26/2021 INITIAL  PLAN: PT FREQUENCY: 2x/week   PT DURATION: 8 weeks   PLANNED INTERVENTIONS: Therapeutic exercises, Therapeutic activity, Neuro Muscular re-education, Gait training, Patient/Family education, Joint mobilization, Aquatic Therapy, Dry Needling, Electrical stimulation, Cryotherapy, Moist heat, Taping, and Manual therapy   PLAN FOR NEXT SESSION: Begin core and postural strengthening. Review self soft tissue mobilization; review stretches and add stretches as needed. Consider rows with band and extensions. Patient has never exercises much but she is active so progress the exercise program at a progressive level; Conider sitting and standing exercises as she has some difficulty getting in and out of the position.       Carney Living PT DPT  10/06/2021, 10:39 AM

## 2021-10-10 ENCOUNTER — Encounter: Payer: Self-pay | Admitting: Rheumatology

## 2021-10-10 ENCOUNTER — Ambulatory Visit (INDEPENDENT_AMBULATORY_CARE_PROVIDER_SITE_OTHER): Payer: Medicare Other | Admitting: Rheumatology

## 2021-10-10 ENCOUNTER — Other Ambulatory Visit: Payer: Self-pay

## 2021-10-10 VITALS — BP 137/86 | HR 63 | Ht 65.0 in | Wt 144.0 lb

## 2021-10-10 DIAGNOSIS — M19041 Primary osteoarthritis, right hand: Secondary | ICD-10-CM

## 2021-10-10 DIAGNOSIS — R5383 Other fatigue: Secondary | ICD-10-CM

## 2021-10-10 DIAGNOSIS — Z79899 Other long term (current) drug therapy: Secondary | ICD-10-CM

## 2021-10-10 DIAGNOSIS — Z8659 Personal history of other mental and behavioral disorders: Secondary | ICD-10-CM

## 2021-10-10 DIAGNOSIS — M170 Bilateral primary osteoarthritis of knee: Secondary | ICD-10-CM

## 2021-10-10 DIAGNOSIS — M19042 Primary osteoarthritis, left hand: Secondary | ICD-10-CM

## 2021-10-10 DIAGNOSIS — M7062 Trochanteric bursitis, left hip: Secondary | ICD-10-CM

## 2021-10-10 DIAGNOSIS — M3501 Sicca syndrome with keratoconjunctivitis: Secondary | ICD-10-CM | POA: Diagnosis not present

## 2021-10-10 DIAGNOSIS — M359 Systemic involvement of connective tissue, unspecified: Secondary | ICD-10-CM

## 2021-10-10 DIAGNOSIS — E559 Vitamin D deficiency, unspecified: Secondary | ICD-10-CM

## 2021-10-10 DIAGNOSIS — M8589 Other specified disorders of bone density and structure, multiple sites: Secondary | ICD-10-CM

## 2021-10-10 DIAGNOSIS — E039 Hypothyroidism, unspecified: Secondary | ICD-10-CM

## 2021-10-10 DIAGNOSIS — S32010A Wedge compression fracture of first lumbar vertebra, initial encounter for closed fracture: Secondary | ICD-10-CM

## 2021-10-10 MED ORDER — HYDROXYCHLOROQUINE SULFATE 200 MG PO TABS
200.0000 mg | ORAL_TABLET | Freq: Every day | ORAL | 0 refills | Status: DC
Start: 2021-10-10 — End: 2021-12-11

## 2021-10-10 NOTE — Patient Instructions (Signed)
Standing Labs We placed an order today for your standing lab work.   Please have your standing labs drawn in May  If possible, please have your labs drawn 2 weeks prior to your appointment so that the provider can discuss your results at your appointment.  Please note that you may see your imaging and lab results in [facility] before we have reviewed them. We may be awaiting multiple results to interpret others before contacting you. Please allow our office up to 72 hours to thoroughly review all of the results before contacting the office for clarification of your results.  We have open lab daily: Monday through Thursday from 1:30-4:30 PM and Friday from 1:30-4:00 PM at the office of Dr. Bo Merino, [facility].   Please be advised, all patients with office appointments requiring lab work will take precedent over walk-in lab work.  If possible, please come for your lab work on Monday and Friday afternoons, as you may experience shorter wait times. The office is located at [address] [place], [place], [place] [address] No appointment is necessary.   Labs are drawn by [facility]. Please bring your co-pay at the time of your lab draw.  You may receive a bill from [facility] for your lab work.  If you wish to have your labs drawn at another location, please call the office 24 hours in advance to send orders.  If you have any questions regarding directions or hours of operation,  please call [phone].   As a reminder, please drink plenty of water prior to coming for your lab work. Thanks!   Vaccines You are taking a medication(s) that can suppress your immune system.  The following immunizations are recommended: Flu annually Covid-19  Td/Tdap (tetanus, diphtheria, pertussis) every 10 years Pneumonia (Prevnar 15 then Pneumovax 23 at least 1 year apart.  Alternatively, can take Prevnar 20 without needing additional dose) Shingrix: 2 doses from 4 weeks to 6 months  apart  Please check with your PCP to make sure you are up to date.

## 2021-10-11 ENCOUNTER — Encounter (HOSPITAL_BASED_OUTPATIENT_CLINIC_OR_DEPARTMENT_OTHER): Payer: Self-pay | Admitting: Physical Therapy

## 2021-10-11 ENCOUNTER — Ambulatory Visit (HOSPITAL_BASED_OUTPATIENT_CLINIC_OR_DEPARTMENT_OTHER): Payer: Medicare Other | Admitting: Physical Therapy

## 2021-10-11 DIAGNOSIS — M545 Low back pain, unspecified: Secondary | ICD-10-CM | POA: Diagnosis not present

## 2021-10-11 DIAGNOSIS — G8929 Other chronic pain: Secondary | ICD-10-CM

## 2021-10-11 DIAGNOSIS — R252 Cramp and spasm: Secondary | ICD-10-CM

## 2021-10-11 NOTE — Therapy (Signed)
OUTPATIENT PHYSICAL THERAPY TREATMENT NOTE   Patient Name: [name] MRN: [id number] DOB:12-04-1945, 75 y.o., female Today's Date: 10/11/2021  PCP: Cari Caraway, MD REFERRING PROVIDER: Cari Caraway, MD   PT End of Session - 10/11/21 [phone]     Visit Number 3    Number of Visits 16    Date for PT Re-Evaluation 11/24/21    PT Start Time 0949    PT Stop Time 1030    PT Time Calculation (min) 41 min    Activity Tolerance Patient tolerated treatment well    Behavior During Therapy [facility] for tasks assessed/performed             Past Medical History:  Diagnosis Date   Allergy    Colon polyp    Compression fracture of L1 lumbar vertebra ([place])    dx by ortho   Environmental allergies    Hypothyroid    Osteoarthritis    Osteoarthritis    Osteoarthritis    Osteopenia    Plantar fasciitis, right    Raynauds phenomenon    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   Sjogren's syndrome ([place])    Systemic lupus ([place])    Past Surgical History:  Procedure Laterality Date   APPENDECTOMY     BUNIONECTOMY  2008   right foot   CATARACT EXTRACTION     COLONOSCOPY W/ POLYPECTOMY  Aug 27, 2011   ENDOMETRIAL ABLATION     FOOT SURGERY Right 12/17/2017   bone spur   ingrown toenail Right 12/16/2018   LAPAROSCOPIC APPENDECTOMY  10/11/2011   Procedure: APPENDECTOMY LAPAROSCOPIC;  Surgeon: Adin Hector, MD;  Location: [facility];  Service: General;  Laterality: N/A;  Partial Cecectomy and Appendectomy   WISDOM TOOTH EXTRACTION     Patient Active Problem List   Diagnosis Date Noted   Encounter for counseling 09/09/2018   Osteopenia of multiple sites 08/22/2017   History of vitamin D deficiency 08/22/2017   Autoimmune disease ([place]) 10/23/2016   Sjogren's syndrome ([place]) 10/09/2016   Primary osteoarthritis of both hands 10/09/2016   Primary osteoarthritis of both knees 10/09/2016   High risk medication use 09/12/2016   SBO (small bowel obstruction), partial postop  10/16/2011   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  No adverse effects from last session.      PERTINENT HISTORY:  Osteopenia, L1 compression fx, OA of the knees and lumbar spine, PF and bone sur in the right foot with bone spur removal, raynaud, lupus Sjogrens    PAIN:  Are you having pain? Yes VAS scale: 1/10 no pain right now. The pain comes at night  Pain location: Low back and into the upper back as the day goes on  Pain orientation: Right and Left  PAIN TYPE: aching Pain description: intermittent  Aggravating factors: as the day progresses  Relieving factors: rest    PRECAUTIONS: Other: osteopenia    WEIGHT BEARING RESTRICTIONS No   FALLS:  Has patient fallen in last 6 months? 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    PLOF: Independent   PATIENT GOALS    To be able to resume her normal activity all day without pain      OBJECTIVE:     TODAY'S TREATMENT  12/21 Pt seen for aquatic therapy today.  Treatment took place in water 3.25-4 ft in depth at the [facility] pool. Temp of water was 91.  Pt entered/exited the pool via stairs (step through pattern) independently with bilat rail.  Warm up: heel/toe  walking x4 widths across pool chest deep side stepping x4 widths Walking with 1 foam hand buoy submerged at hips 4 widths fwd, retro and sidestepping. Cues for holding tight QA Seated (3rd water step) hip flutter 3x25 reps cues for tightened QA Seated add/abd 2x 20  Kick board push down 3x10 cues for tightened QA and glute Board rotation R/L x10  Exercises; Slow march x15;hip extension x10;  heel and toe raise x20    Pt requires buoyancy for support and to offload joints with strengthening exercises. Viscosity of the water is needed for resistance of strengthening; water current perturbations provides challenge to standing balance unsupported, requiring increased core activation.     12/16 Pt seen for aquatic therapy today.  Treatment took place in water 3.25-4 ft in depth at the [facility] pool. Temp of water was 91.  Pt entered/exited the pool via stairs (step through pattern) independently with bilat rail.  Introduction to water. Had patient stand at different levels so she could feel the buoyancy    Warm up: heel/toe walking x4 laps across pool chest deep side stepping x4 laps from shallow to deep; 2 laps side step ith stretch  Ambulation with noodle support: long strides, march  Exercises; Slow march x15; ; hip extension x10; hip abduction x15; heel raise x20    board trunk flexion x10 lateral board rotation x10 each way seated   Board press x20     Pt requires buoyancy for support and to offload joints with strengthening exercises. Viscosity of the water is needed for resistance of strengthening; water current perturbations provides challenge to standing balance unsupported, requiring increased core activation.    Evals  Exercises Supine Lower Trunk Rotation - 1 x daily - 7 x weekly - 3 sets - 10 reps Shoulder External Rotation and Scapular Retraction with Resistance - 1 x daily - 7 x weekly - 3 sets - 10 reps Seated Hip Abduction with Resistance - 1 x daily - 7  x weekly - 3 sets -  10 reps Theracane Over Shoulder - 1 x daily - 7 x weekly - 3 sets - 10 reps     PATIENT EDUCATION:  Education details: reviewed HEP and symptom management;anatomy of condition; benefits of core and postural exercises  Person educated: Patient Education method: Explanation, Demonstration, Tactile cues, Verbal cues, and Handouts Education comprehension: verbalized understanding, returned demonstration, verbal cues required, tactile cues required, and needs further education     HOME EXERCISE PROGRAM: Access Code: [email or web address] URL: [email or web address] Date: 10/01/2021 Prepared by: Carolyne Littles   Exercises Supine Lower Trunk Rotation - 1 x daily - 7 x weekly - 3 sets - 10 reps Shoulder External Rotation and Scapular Retraction with Resistance - 1 x daily - 7 x weekly - 3 sets - 10 reps Seated Hip Abduction with Resistance - 1 x daily - 7 x weekly - 3 sets - 10 reps Theracane Over Shoulder - 1 x daily - 7 x weekly - 3 sets - 10 reps     ASSESSMENT:   CLINICAL IMPRESSION: Patient tolerated water treatment session well. Did complain of some "twinging and burning" on left LB. Moved pt to deeper water to eliminate (chest deep). Added core isometrics with exercises for improved core engagement/ strengthening.      Objective impairments include decreased activity tolerance, decreased endurance, decreased mobility, decreased ROM, decreased strength, increased fascial restrictions, increased muscle spasms, and pain. These impairments are limiting patient from cleaning, meal prep, laundry, and shopping. Personal factors including 3+ comorbidities: lupus, L1 compression fx LE arthritis, knee arthritis   are also affecting patient's functional outcome. Patient will benefit from skilled PT to address above impairments and improve overall function.   REHAB POTENTIAL: Good   CLINICAL DECISION MAKING: Evolving/moderate complexity declining ability to stand and perform  ADL's    EVALUATION COMPLEXITY: Moderate     GOALS: Goals reviewed with patient? Yes   SHORT TERM GOALS:   STG Name Target Date Goal status  1 Patient will increase bilateral gross hip strength by 5 lbs  Baseline:  10/22/2021 INITIAL  2 Patient will demonstrate full rotation bilateral without pain  Baseline:  10/22/2021 INITIAL  3 Patient will increase lumbar flexion by 20 degrees  Baseline: 10/22/2021 INITIAL  dLONG TERM GOALS:    LTG Name Target Date Goal status  1 Patient will bend to pick items off the ground without pain  Baseline: 11/26/2021 INITIAL  2 Patient will ambulate 3000' without increased pain in order to perform ADL's  Baseline: 11/26/2021 INITIAL  3 Patient will stand for 2 hours without increased pain in order to perfrom ADL's  Baseline: 11/26/2021 INITIAL  PLAN: PT FREQUENCY: 2x/week   PT DURATION: 8 weeks   PLANNED INTERVENTIONS: Therapeutic exercises, Therapeutic activity, Neuro Muscular re-education, Gait training, Patient/Family education, Joint mobilization, Aquatic Therapy, Dry Needling, Electrical stimulation, Cryotherapy, Moist heat, Taping, and Manual therapy   PLAN FOR NEXT SESSION: Begin core and postural strengthening. Review self soft tissue mobilization; review stretches and add stretches as needed. Consider rows with band and extensions. Patient has never exercises much but she is active so progress the exercise program at a progressive level; Conider sitting and standing exercises as she has some difficulty getting in and out of the position.       Macky [place] Sommer Spickard MPT 10/11/2021, 10:33 AM

## 2021-10-11 NOTE — Progress Notes (Signed)
Ro antibody is positive which is consistent with Sjogren's.  All other labs are within normal limits.

## 2021-10-12 LAB — COMPLETE METABOLIC PANEL WITH GFR
AG Ratio: 1.6 (calc) (ref 1.0–2.5)
ALT: 16 U/L (ref 6–29)
AST: 21 U/L (ref 10–35)
Albumin: 4.3 g/dL (ref 3.6–5.1)
Alkaline phosphatase (APISO): 43 U/L (ref 37–153)
BUN: 15 mg/dL (ref 7–25)
CO2: 28 mmol/L (ref 20–32)
Calcium: 9.6 mg/dL (ref 8.6–10.4)
Chloride: 106 mmol/L (ref 98–110)
Creat: 0.72 mg/dL (ref 0.60–1.00)
Globulin: 2.7 g/dL (calc) (ref 1.9–3.7)
Glucose, Bld: 94 mg/dL (ref 65–99)
Potassium: 4.7 mmol/L (ref 3.5–5.3)
Sodium: 141 mmol/L (ref 135–146)
Total Bilirubin: 0.5 mg/dL (ref 0.2–1.2)
Total Protein: 7 g/dL (ref 6.1–8.1)
eGFR: 87 mL/min/{1.73_m2} (ref >=60)

## 2021-10-12 LAB — CBC WITH DIFFERENTIAL/PLATELET
Absolute Monocytes: 502 cells/uL (ref 200–950)
Basophils Absolute: 62 cells/uL (ref 0–200)
Basophils Relative: 1 %
Eosinophils Absolute: 273 cells/uL (ref 15–500)
Eosinophils Relative: 4.4 %
HCT: 41.1 % (ref 35.0–45.0)
Hemoglobin: 13.8 g/dL (ref 11.7–15.5)
Lymphs Abs: 936 cells/uL (ref 850–3900)
MCH: 33 pg (ref 27.0–33.0)
MCHC: 33.6 g/dL (ref 32.0–36.0)
MCV: 98.3 fL (ref 80.0–100.0)
MPV: 10.6 fL (ref 7.5–12.5)
Monocytes Relative: 8.1 %
Neutro Abs: 4427 cells/uL (ref 1500–7800)
Neutrophils Relative %: 71.4 %
Platelets: 256 10*3/uL (ref 140–400)
RBC: 4.18 10*6/uL (ref 3.80–5.10)
RDW: 11.9 % (ref 11.0–15.0)
Total Lymphocyte: 15.1 %
WBC: 6.2 10*3/uL (ref 3.8–10.8)

## 2021-10-12 LAB — PROTEIN ELECTROPHORESIS, SERUM, WITH REFLEX
Albumin ELP: 4.3 g/dL (ref 3.8–4.8)
Alpha 1: 0.3 g/dL (ref 0.2–0.3)
Alpha 2: 0.7 g/dL (ref 0.5–0.9)
Beta 2: 0.4 g/dL (ref 0.2–0.5)
Beta Globulin: 0.4 g/dL (ref 0.4–0.6)
Gamma Globulin: 1 g/dL (ref 0.8–1.7)
Total Protein: 7.1 g/dL (ref 6.1–8.1)

## 2021-10-12 LAB — URINALYSIS, ROUTINE W REFLEX MICROSCOPIC
Bilirubin Urine: NEGATIVE
Glucose, UA: NEGATIVE
Hgb urine dipstick: NEGATIVE
Ketones, ur: NEGATIVE
Leukocytes,Ua: NEGATIVE
Nitrite: NEGATIVE
Protein, ur: NEGATIVE
Specific Gravity, Urine: 1.007 (ref 1.001–1.035)
pH: 6 (ref 5.0–8.0)

## 2021-10-12 LAB — C3 AND C4
C3 Complement: 127 mg/dL (ref 83–193)
C4 Complement: 26 mg/dL (ref 15–57)

## 2021-10-12 LAB — SEDIMENTATION RATE: Sed Rate: 9 mm/h (ref 0–30)

## 2021-10-12 LAB — ANTI-DNA ANTIBODY, DOUBLE-STRANDED: ds DNA Ab: 1 IU/mL

## 2021-10-12 LAB — SJOGRENS SYNDROME-A EXTRACTABLE NUCLEAR ANTIBODY: SSA (Ro) (ENA) Antibody, IgG: >8 AI — AB

## 2021-10-12 LAB — RHEUMATOID FACTOR: Rheumatoid fact SerPl-aCnc: <14 IU/mL (ref <14)

## 2021-10-13 ENCOUNTER — Ambulatory Visit (HOSPITAL_BASED_OUTPATIENT_CLINIC_OR_DEPARTMENT_OTHER): Payer: Medicare Other | Admitting: Physical Therapy

## 2021-10-13 ENCOUNTER — Encounter (HOSPITAL_BASED_OUTPATIENT_CLINIC_OR_DEPARTMENT_OTHER): Payer: Self-pay

## 2021-10-20 ENCOUNTER — Encounter (HOSPITAL_BASED_OUTPATIENT_CLINIC_OR_DEPARTMENT_OTHER): Payer: Self-pay

## 2021-10-20 ENCOUNTER — Ambulatory Visit (HOSPITAL_BASED_OUTPATIENT_CLINIC_OR_DEPARTMENT_OTHER): Payer: Medicare Other | Admitting: Physical Therapy

## 2021-10-24 ENCOUNTER — Other Ambulatory Visit: Payer: Self-pay

## 2021-10-24 ENCOUNTER — Ambulatory Visit (HOSPITAL_BASED_OUTPATIENT_CLINIC_OR_DEPARTMENT_OTHER): Payer: Medicare Other | Admitting: Physical Therapy

## 2021-10-24 ENCOUNTER — Ambulatory Visit (HOSPITAL_BASED_OUTPATIENT_CLINIC_OR_DEPARTMENT_OTHER): Payer: Medicare Other | Attending: Family Medicine | Admitting: Physical Therapy

## 2021-10-24 DIAGNOSIS — R252 Cramp and spasm: Secondary | ICD-10-CM | POA: Insufficient documentation

## 2021-10-24 DIAGNOSIS — M545 Low back pain, unspecified: Secondary | ICD-10-CM | POA: Insufficient documentation

## 2021-10-24 DIAGNOSIS — G8929 Other chronic pain: Secondary | ICD-10-CM | POA: Insufficient documentation

## 2021-10-24 NOTE — Therapy (Signed)
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   Sjogren's syndrome ([place])    Systemic lupus ([place])    Past Surgical History:  Procedure Laterality Date   APPENDECTOMY     BUNIONECTOMY  2008   right foot   CATARACT EXTRACTION     COLONOSCOPY W/ POLYPECTOMY  Aug 27, 2011   ENDOMETRIAL ABLATION     FOOT SURGERY Right 12/17/2017   bone spur   ingrown toenail Right 12/16/2018   LAPAROSCOPIC APPENDECTOMY  10/11/2011   Procedure: APPENDECTOMY LAPAROSCOPIC;  Surgeon: Adin Hector, MD;  Location: [facility];  Service: General;  Laterality: N/A;  Partial Cecectomy and Appendectomy   WISDOM TOOTH EXTRACTION     Patient Active Problem List   Diagnosis Date Noted   Encounter for counseling 09/09/2018   Osteopenia of multiple sites 08/22/2017   History of vitamin D deficiency 08/22/2017   Autoimmune disease ([place]) 10/23/2016   Sjogren's syndrome ([place]) 10/09/2016   Primary osteoarthritis of both hands 10/09/2016   Primary osteoarthritis of both knees 10/09/2016   High risk medication use 09/12/2016   SBO (small bowel obstruction), partial postop 10/16/2011   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   BILIARY DYSKINESIA, 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  No adverse effects from last session.      PERTINENT HISTORY:  Osteopenia, L1 compression fx, OA of the knees and  lumbar spine, PF and bone sur in the right foot with bone spur removal, raynaud, lupus Sjogrens    PAIN:  Are you having pain? Yes VAS scale: 1/10 no pain right now. The pain comes at night  Pain location: Low back and into the upper back as the day goes on  Pain orientation: Right and Left  PAIN TYPE: aching Pain description: intermittent  Aggravating factors: as the day progresses  Relieving factors: rest    PRECAUTIONS: Other: osteopenia    WEIGHT BEARING RESTRICTIONS No   FALLS:  Has patient fallen in last 6 months? 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    PLOF: Independent   PATIENT GOALS    To be able to resume her normal activity all day without pain      OBJECTIVE:     TODAY'S TREATMENT  1/4 Pt seen for aquatic therapy today.  Treatment took place in water 3.25-4 ft in depth at the [facility] pool. Temp of water was 91.  Pt entered/exited the pool via stairs (step through pattern) independently with bilat rail.   Introduction to water. Had patient stand at different levels so she could feel the buoyancy     Warm up: heel/toe walking x4 laps across pool chest deep side stepping x4 laps from shallow to deep; 2 laps side step ith stretch  Ambulation with noodle support: long strides, march  Exercises; Slow march x15; ; hip extension x10; hip abduction x15; heel  raise x20     board trunk flexion x10 lateral board rotation x10 each way seated    Board press x20    LAQ 2x10   Weight press rainbow x20  Weight extension x15  Weight winging x15        Pt requires buoyancy for support and to offload joints with strengthening exercises. Viscosity of the water is needed for resistance of strengthening; water current perturbations provides challenge to standing balance unsupported, requiring increased core activation.     12/21 Pt seen for aquatic therapy today.  Treatment took place in water 3.25-4 ft in depth at the [facility] pool. Temp of water was 91.  Pt entered/exited the pool via stairs (step through pattern) independently with bilat rail.   Warm up: heel/toe walking x4 widths across pool chest deep side stepping x4 widths Walking with 1 foam hand buoy submerged at hips 4 widths fwd, retro and sidestepping. Cues for holding tight QA Seated (3rd water step) hip flutter 3x25 reps cues for tightened QA Seated add/abd 2x 20   Kick board push down 3x10 cues for tightened QA and glute Board rotation R/L x10   Exercises; Slow march x15;hip extension x10;  heel and toe raise x20     Pt requires buoyancy for support and to offload joints with strengthening exercises. Viscosity of the water is needed for resistance of strengthening; water current perturbations provides challenge to standing balance unsupported, requiring increased core activation.         12/16 Pt seen for aquatic therapy today.  Treatment took place in water 3.25-4 ft in depth at the [facility] pool. Temp of water was 91.  Pt entered/exited the pool via stairs (step through pattern) independently with bilat rail.   Introduction to water. Had patient stand at different levels so she could feel the  buoyancy     Warm up: heel/toe walking x4 laps across pool chest deep side stepping x4 laps from shallow to deep; 2 laps side step ith stretch  Ambulation with  noodle support: long strides, march  Exercises; Slow march x15; ; hip extension x10; hip abduction x15; heel raise x20     board trunk flexion x10 lateral board rotation x10 each way seated    Board press x20        Pt requires buoyancy for support and to offload joints with strengthening exercises. Viscosity of the water is needed for resistance of strengthening; water current perturbations provides challenge to standing balance unsupported, requiring increased core activation.     Evals   Exercises Supine Lower Trunk Rotation - 1 x daily - 7 x weekly - 3 sets - 10 reps Shoulder External Rotation and Scapular Retraction with Resistance - 1 x daily - 7 x weekly - 3 sets - 10 reps Seated Hip Abduction with Resistance - 1 x daily - 7 x weekly - 3 sets - 10 reps Theracane Over Shoulder - 1 x daily - 7 x weekly - 3 sets - 10 reps     PATIENT EDUCATION:  Education details: reviewed HEP and symptom management;anatomy of condition; benefits of core and postural exercises  Person educated: Patient Education method: Explanation, Demonstration, Tactile cues, Verbal cues, and Handouts Education comprehension: verbalized understanding, returned demonstration, verbal cues required, tactile cues required, and needs further education     HOME EXERCISE PROGRAM: Access Code: [email or web address] URL: [email or web address] Date: 10/01/2021 Prepared by: Carolyne Littles   Exercises Supine Lower Trunk Rotation - 1 x daily - 7 x weekly - 3 sets - 10 reps Shoulder External Rotation and Scapular Retraction with Resistance - 1 x daily - 7 x weekly - 3 sets - 10 reps Seated Hip Abduction with Resistance - 1 x daily - 7 x weekly - 3 sets - 10 reps Theracane Over Shoulder - 1 x daily - 7 x weekly - 3 sets - 10 reps     ASSESSMENT:   CLINICAL IMPRESSION: Patient tolerated treatment well. Therapy continues to focus on postural correction and gait training. She is doing well. She reports improvement in  rotation getting in and out of the car. She hs been given some exercises from Dr Estanislado Pandy which have helped as well. Therapy will continue to advance ter-ex as tolerated.      Objective impairments include decreased activity tolerance, decreased endurance, decreased mobility, decreased ROM, decreased strength, increased fascial restrictions, increased muscle spasms, and pain. These impairments are limiting patient from cleaning, meal prep, laundry, and shopping. Personal factors including 3+ comorbidities: lupus, L1 compression fx LE arthritis, knee arthritis   are also affecting patient's functional outcome. Patient will benefit from skilled PT to address above impairments and improve overall function.   REHAB POTENTIAL: Good   CLINICAL DECISION MAKING: Evolving/moderate complexity declining ability to stand and perform ADL's    EVALUATION COMPLEXITY: Moderate     GOALS: Goals reviewed with patient? Yes   SHORT TERM GOALS:   STG Name Target Date Goal status  1 Patient will increase bilateral gross hip strength by 5 lbs  Baseline:  10/22/2021 10/22/2021 Not tested bu strength improving per visual inspection   2 Patient will demonstrate full rotation bilateral without pain  Baseline:  10/22/2021 1/3 noticing improved rotation from a car   3 Patient will increase lumbar flexion by 20 degrees  Baseline: 10/22/2021 Reports functional improvement but not measured  today.   dLONG TERM GOALS:    LTG Name Target Date Goal status  1 Patient will bend to pick items off the ground without pain  Baseline: 11/26/2021 INITIAL  2 Patient will ambulate 3000' without increased pain in order to perform ADL's  Baseline: 11/26/2021 INITIAL  3 Patient will stand for 2 hours without increased pain in order to perfrom ADL's  Baseline: 11/26/2021 INITIAL  PLAN: PT FREQUENCY: 2x/week   PT DURATION: 8 weeks   PLANNED INTERVENTIONS: Therapeutic exercises, Therapeutic activity, Neuro Muscular re-education, Gait  training, Patient/Family education, Joint mobilization, Aquatic Therapy, Dry Needling, Electrical stimulation, Cryotherapy, Moist heat, Taping, and Manual therapy   PLAN FOR NEXT SESSION: Begin core and postural strengthening. Review self soft tissue mobilization; review stretches and add stretches as needed. Consider rows with band and extensions. Patient has never exercises much but she is active so progress the exercise program at a progressive level; Conider sitting and standing exercises as she has some difficulty getting in and out of the position. Continue with posterior chain strengthening in the pool             Carney Living PT DPT  10/24/2021, 3:01 PM

## 2021-10-25 ENCOUNTER — Encounter (HOSPITAL_BASED_OUTPATIENT_CLINIC_OR_DEPARTMENT_OTHER): Payer: Self-pay | Admitting: Physical Therapy

## 2021-10-27 ENCOUNTER — Other Ambulatory Visit: Payer: Self-pay

## 2021-10-27 ENCOUNTER — Ambulatory Visit (HOSPITAL_BASED_OUTPATIENT_CLINIC_OR_DEPARTMENT_OTHER): Payer: Medicare Other | Admitting: Physical Therapy

## 2021-10-27 DIAGNOSIS — M545 Low back pain, unspecified: Secondary | ICD-10-CM | POA: Diagnosis not present

## 2021-10-27 DIAGNOSIS — G8929 Other chronic pain: Secondary | ICD-10-CM

## 2021-10-27 DIAGNOSIS — R252 Cramp and spasm: Secondary | ICD-10-CM

## 2021-10-29 ENCOUNTER — Encounter (HOSPITAL_BASED_OUTPATIENT_CLINIC_OR_DEPARTMENT_OTHER): Payer: Self-pay | Admitting: Physical Therapy

## 2021-10-29 NOTE — Therapy (Signed)
OUTPATIENT PHYSICAL THERAPY TREATMENT NOTE   Patient Name: [name] MRN: [id number] DOB:[birth year], 76 y.o., female Today's Date: 10/27/2021  PCP: Christine Caraway, MD REFERRING PROVIDER: Cari Caraway, MD   PT End of Session - 10/29/21 1032     Visit Number 5    Number of Visits 16    Date for PT Re-Evaluation 11/24/21    Authorization Type Lumbar    PT Start Time 1515    PT Stop Time 1558    PT Time Calculation (min) 43 min    Activity Tolerance Patient tolerated treatment well    Behavior During Therapy [facility] for tasks assessed/performed             Past Medical History:  Diagnosis Date   Allergy    Colon polyp    Compression fracture of L1 lumbar vertebra ([place])    dx by ortho   Environmental allergies    Hypothyroid    Osteoarthritis    Osteoarthritis    Osteoarthritis    Osteopenia    Plantar fasciitis, right    Raynauds phenomenon    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   Sjogren's syndrome ([place])    Systemic lupus ([place])    Past Surgical History:  Procedure Laterality Date   APPENDECTOMY     BUNIONECTOMY  2008   right foot   CATARACT EXTRACTION     COLONOSCOPY W/ POLYPECTOMY  Aug 27, 2011   ENDOMETRIAL ABLATION     FOOT SURGERY Right 12/17/2017   bone spur   ingrown toenail Right 12/16/2018   LAPAROSCOPIC APPENDECTOMY  10/11/2011   Procedure: APPENDECTOMY LAPAROSCOPIC;  Surgeon: Adin Hector, MD;  Location: [facility];  Service: General;  Laterality: N/A;  Partial Cecectomy and Appendectomy   WISDOM TOOTH EXTRACTION     Patient Active Problem List   Diagnosis Date Noted   Encounter for counseling 09/09/2018   Osteopenia of multiple sites 08/22/2017   History of vitamin D deficiency 08/22/2017   Autoimmune disease ([place]) 10/23/2016   Sjogren's syndrome ([place]) 10/09/2016   Primary osteoarthritis of both hands 10/09/2016   Primary osteoarthritis of both knees 10/09/2016   High risk medication use 09/12/2016   SBO (small bowel  obstruction), partial postop 10/16/2011   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      PERTINENT HISTORY:  Osteopenia, L1 compression fx, OA of the knees and lumbar spine, PF and bone sur in the right foot with bone spur removal, raynaud, lupus Sjogrens    PAIN:  Are you having pain? Yes 1/8  VAS scale: o/10 no pain right now. The pain comes at night  Pain location: Low back and into the upper back as the day goes on  Pain orientation: Right and Left  PAIN TYPE: aching Pain description: intermittent  Aggravating factors: as the day progresses  Relieving factors: rest    PRECAUTIONS: Other: osteopenia    WEIGHT BEARING RESTRICTIONS No   FALLS:  Has patient fallen in last 6 months? 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    PLOF: Independent   PATIENT GOALS    To be able to resume her normal activity all day without pain      OBJECTIVE:     TODAY'S TREATMENT  1/6 Pt seen for aquatic therapy today.  Treatment took place in water 3.25-4 ft in depth at the [facility] pool. Temp of water was 91.  Pt entered/exited the pool via stairs (step through  pattern) independently with bilat rail.       Warm up: heel/toe walking x4 laps across pool chest deep side stepping x4 laps from shallow to deep; 2 laps side step ith stretch  Ambulation with noodle support: long strides, march  Exercises; Slow march x15; ; hip extension x10; hip abduction x15; heel raise x20     board trunk flexion x10 lateral board rotation x10 each way seated    Board press x20    LAQ 2x10   Weight press rainbow x20  Weight extension x15  Weight winging x15        Pt requires buoyancy for support and to offload joints with strengthening exercises. Viscosity of the water is needed for resistance of strengthening; water current perturbations provides challenge to standing balance unsupported, requiring increased core activation.   1/4 Pt seen for aquatic therapy today.  Treatment took place in water 3.25-4 ft in depth at the [facility] pool. Temp of water was 91.  Pt entered/exited the pool via stairs (step through pattern) independently with bilat rail.   Introduction to water. Had patient stand at different levels so she could feel the buoyancy     Warm up: heel/toe walking x4 laps across pool chest deep side stepping x4 laps from shallow to deep; 2 laps side step ith stretch  Ambulation with noodle support: long strides, march  Exercises; Slow march x15; ; hip extension x10; hip abduction x15; heel raise x20     board trunk flexion x10 lateral board rotation x10 each way seated    Board press x20    LAQ 2x10   Weight press rainbow x20  Weight extension x15  Weight winging x15        Pt requires buoyancy for support and to offload joints with strengthening exercises. Viscosity of the water is needed for resistance of strengthening; water current perturbations provides challenge to standing balance unsupported, requiring increased core activation.     12/21 Pt seen for aquatic therapy today.  Treatment took place in water  3.25-4 ft in  depth at the [facility] pool. Temp of water was 91.  Pt entered/exited the pool via stairs (step through pattern) independently with bilat rail.   Warm up: heel/toe walking x4 widths across pool chest deep side stepping x4 widths Walking with 1 foam hand buoy submerged at hips 4 widths fwd, retro and sidestepping. Cues for holding tight QA Seated (3rd water step) hip flutter 3x25 reps cues for tightened QA Seated add/abd 2x 20   Kick board push down 3x10 cues for tightened QA and glute Board rotation R/L x10   Exercises; Slow march x15;hip extension x10;  heel and toe raise x20     Pt requires buoyancy for support and to offload joints with strengthening exercises. Viscosity of the water is needed for resistance of strengthening; water current perturbations provides challenge to standing balance unsupported, requiring increased core activation.         12/16 Pt seen for aquatic therapy today.  Treatment took place in water 3.25-4 ft in depth at the [facility] pool. Temp of water was 91.  Pt entered/exited the pool via stairs (step through pattern) independently with bilat rail.   Introduction to water. Had patient stand at different levels so she could feel the buoyancy     Warm up: heel/toe walking x4 laps across pool chest deep side stepping x4 laps from shallow to deep; 2 laps side step ith stretch  Ambulation with noodle support: long strides, march  Exercises; Slow march x15; ; hip extension x10; hip abduction x15; heel raise x20     board trunk flexion x10 lateral board rotation x10 each way seated    Board press x20        Pt requires buoyancy for support and to offload joints with strengthening exercises. Viscosity of the water is needed for resistance of strengthening; water current perturbations provides challenge to standing balance unsupported, requiring increased core activation.     Evals   Exercises Supine Lower Trunk Rotation - 1 x daily - 7  x weekly - 3 sets - 10 reps Shoulder External Rotation and Scapular Retraction with Resistance - 1 x daily - 7 x weekly - 3 sets - 10 reps Seated Hip Abduction with Resistance - 1 x daily - 7 x weekly - 3 sets - 10 reps Theracane Over Shoulder - 1 x daily - 7 x weekly - 3 sets - 10 reps     PATIENT EDUCATION:  Education details: reviewed HEP and symptom management;anatomy of condition; benefits of core and postural exercises  Person educated: Patient Education method: Explanation, Demonstration, Tactile cues, Verbal cues, and Handouts Education comprehension: verbalized understanding, returned demonstration, verbal cues required, tactile cues required, and needs further education     HOME EXERCISE PROGRAM: Access Code: [email or web address] URL: [email or web address] Date: 10/01/2021 Prepared by: Carolyne Littles   Exercises Supine Lower Trunk Rotation - 1 x daily - 7 x weekly - 3 sets - 10 reps Shoulder External Rotation and Scapular Retraction with Resistance - 1 x daily - 7 x weekly - 3 sets - 10 reps Seated Hip Abduction with Resistance - 1 x daily - 7 x weekly - 3 sets - 10 reps Theracane Over Shoulder - 1 x daily - 7 x weekly - 3 sets - 10 reps     ASSESSMENT:   CLINICAL IMPRESSION: Patient continues to tolerate treatment well. She had no pain with exercises. Therapy will continue to advance as tolerated.    Objective impairments include decreased  activity tolerance, decreased endurance, decreased mobility, decreased ROM, decreased strength, increased fascial restrictions, increased muscle spasms, and pain. These impairments are limiting patient from cleaning, meal prep, laundry, and shopping. Personal factors including 3+ comorbidities: lupus, L1 compression fx LE arthritis, knee arthritis   are also affecting patient's functional outcome. Patient will benefit from skilled PT to address above impairments and improve overall function.   REHAB POTENTIAL: Good   CLINICAL  DECISION MAKING: Evolving/moderate complexity declining ability to stand and perform ADL's    EVALUATION COMPLEXITY: Moderate     GOALS: Goals reviewed with patient? Yes   SHORT TERM GOALS:   STG Name Target Date Goal status  1 Patient will increase bilateral gross hip strength by 5 lbs  Baseline:  10/22/2021 10/22/2021 Not tested bu strength improving per visual inspection   2 Patient will demonstrate full rotation bilateral without pain  Baseline:  10/22/2021 1/3 noticing improved rotation from a car   3 Patient will increase lumbar flexion by 20 degrees  Baseline: 10/22/2021 Reports functional improvement but not measured today.   dLONG TERM GOALS:    LTG Name Target Date Goal status  1 Patient will bend to pick items off the ground without pain  Baseline: 11/26/2021 INITIAL  2 Patient will ambulate 3000' without increased pain in order to perform ADL's  Baseline: 11/26/2021 INITIAL  3 Patient will stand for 2 hours without increased pain in order to perfrom ADL's  Baseline: 11/26/2021 INITIAL  PLAN: PT FREQUENCY: 2x/week   PT DURATION: 8 weeks   PLANNED INTERVENTIONS: Therapeutic exercises, Therapeutic activity, Neuro Muscular re-education, Gait training, Patient/Family education, Joint mobilization, Aquatic Therapy, Dry Needling, Electrical stimulation, Cryotherapy, Moist heat, Taping, and Manual therapy   PLAN FOR NEXT SESSION: Begin core and postural strengthening. Review self soft tissue mobilization; review stretches and add stretches as needed. Consider rows with band and extensions. Patient has never exercises much but she is active so progress the exercise program at a progressive level; Conider sitting and standing exercises as she has some difficulty getting in and out of the position. Continue with posterior chain strengthening in the pool             Carney Living PT DPT  10/29/2021, 10:36 AM

## 2021-10-31 ENCOUNTER — Encounter (HOSPITAL_BASED_OUTPATIENT_CLINIC_OR_DEPARTMENT_OTHER): Payer: Self-pay | Admitting: Physical Therapy

## 2021-10-31 ENCOUNTER — Ambulatory Visit (HOSPITAL_BASED_OUTPATIENT_CLINIC_OR_DEPARTMENT_OTHER): Payer: Medicare Other | Admitting: Physical Therapy

## 2021-10-31 ENCOUNTER — Other Ambulatory Visit: Payer: Self-pay

## 2021-10-31 DIAGNOSIS — R252 Cramp and spasm: Secondary | ICD-10-CM

## 2021-10-31 DIAGNOSIS — M545 Low back pain, unspecified: Secondary | ICD-10-CM

## 2021-10-31 DIAGNOSIS — G8929 Other chronic pain: Secondary | ICD-10-CM

## 2021-10-31 NOTE — Therapy (Signed)
OUTPATIENT PHYSICAL THERAPY TREATMENT NOTE   Patient Name: [name] MRN: [id number] DOB:05/06/1946, 76 y.o., female Today's Date: 10/27/2021  PCP: Cari Caraway, MD REFERRING PROVIDER: Cari Caraway, MD   PT End of Session - 10/31/21 2055     Visit Number 6    Number of Visits 16    Date for PT Re-Evaluation 11/24/21    Authorization Type Lumbar    PT Start Time 0845    PT Stop Time 0926    PT Time Calculation (min) 41 min    Activity Tolerance Patient tolerated treatment well    Behavior During Therapy [facility] for tasks assessed/performed              Past Medical History:  Diagnosis Date   Allergy    Colon polyp    Compression fracture of L1 lumbar vertebra ([place])    dx by ortho   Environmental allergies    Hypothyroid    Osteoarthritis    Osteoarthritis    Osteoarthritis    Osteopenia    Plantar fasciitis, right    Raynauds phenomenon    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   Sjogren's syndrome ([place])    Systemic lupus ([place])    Past Surgical History:  Procedure Laterality Date   APPENDECTOMY     BUNIONECTOMY  2008   right foot   CATARACT EXTRACTION     COLONOSCOPY W/ POLYPECTOMY  Aug 27, 2011   ENDOMETRIAL ABLATION     FOOT SURGERY Right 12/17/2017   bone spur   ingrown toenail Right 12/16/2018   LAPAROSCOPIC APPENDECTOMY  10/11/2011   Procedure: APPENDECTOMY LAPAROSCOPIC;  Surgeon: Adin Hector, MD;  Location: [facility];  Service: General;  Laterality: N/A;  Partial Cecectomy and Appendectomy   WISDOM TOOTH EXTRACTION     Patient Active Problem List   Diagnosis Date Noted   Encounter for counseling 09/09/2018   Osteopenia of multiple sites 08/22/2017   History of vitamin D deficiency 08/22/2017   Autoimmune disease ([place]) 10/23/2016   Sjogren's syndrome ([place]) 10/09/2016   Primary osteoarthritis of both hands 10/09/2016   Primary osteoarthritis of both knees 10/09/2016   High risk medication use 09/12/2016   SBO (small bowel  obstruction), partial postop 10/16/2011   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 Her knees are still a little sore today.     PERTINENT HISTORY:  Osteopenia, L1 compression fx, OA of the knees and lumbar spine, PF and bone sur in the right foot with bone spur removal, raynaud, lupus Sjogrens    PAIN:  Are you having pain? Yes 1/10 VAS scale: 2/10 no pain right now. The pain comes at night  Pain location: right and left knee   Pain orientation: Right and Left  PAIN TYPE: aching Pain description: intermittent  Aggravating factors: as the day progresses  Relieving factors: rest    PRECAUTIONS: Other: osteopenia    WEIGHT BEARING RESTRICTIONS No   FALLS:  Has patient fallen in last 6 months? 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    PLOF: Independent   PATIENT GOALS    To be able to resume her normal activity all day without pain      OBJECTIVE:     TODAY'S TREATMENT  1/10 Pt seen for aquatic therapy today.  Treatment took place in water 3.25-4 ft in depth at the [facility] pool. Temp of water was 91.  Pt entered/exited the pool via stairs (step through pattern)  independently with bilat rail.    Warm up: heel/toe walking x4 laps across pool chest deep side stepping x4 laps from shallow to deep; 2 laps side step ith stretch     board trunk flexion x10 lateral board rotation x10 each way seated    Board press x20   Weight press rainbow 2x10  Weight extension 2x10 Weight winging 2x10    1/6 Pt seen for aquatic therapy today.  Treatment took place in water 3.25-4 ft in depth at the [facility] pool. Temp of water was 91.  Pt entered/exited the pool via stairs (step through pattern) independently with bilat rail.       Warm up: heel/toe walking x4 laps across pool chest deep side stepping x4 laps from shallow to deep; 2 laps side step ith stretch  Ambulation with noodle support: long strides, march  Exercises; Slow march x15; ; hip extension x10; hip abduction x15; heel raise x20     board trunk flexion x10 lateral board rotation x10 each way seated    Board press x20    LAQ 2x10   Weight press rainbow x20  Weight extension x15  Weight winging x15        Pt requires buoyancy for support and to offload joints with strengthening exercises. Viscosity of the water is needed for resistance of strengthening; water current perturbations provides challenge to standing balance unsupported, requiring increased core activation.     PATIENT EDUCATION:  Education details:UE ore strengthening exercise technique   benefits of core and postural exercises  Person educated: Patient Education method: Explanation, Demonstration, Tactile cues, Verbal cues, and Handouts Education comprehension: verbalized understanding, returned demonstration, verbal cues required, tactile cues required, and needs further education     HOME EXERCISE PROGRAM: Access Code: [email or web address] URL: [email or web address] Date: 10/01/2021 Prepared by: Carolyne Littles   Exercises Supine Lower Trunk Rotation - 1 x daily - 7 x weekly - 3 sets - 10  reps Shoulder External Rotation and Scapular Retraction with Resistance - 1 x daily - 7 x weekly -  3 sets - 10 reps Seated Hip Abduction with Resistance - 1 x daily - 7 x weekly - 3 sets - 10 reps Theracane Over Shoulder - 1 x daily - 7 x weekly - 3 sets - 10 reps     ASSESSMENT:   CLINICAL IMPRESSION: Therapy focused more on UE exercises today. She was given a  series of UE exercises with the rainbow weights. She tolerated well. She was still able to perform her hip series of exercises, she just did not perform marching. She felt like the marching flaired up her knees. Therapy will see her on land next visit.    Objective impairments include decreased activity tolerance, decreased endurance, decreased mobility, decreased ROM, decreased strength, increased fascial restrictions, increased muscle spasms, and pain. These impairments are limiting patient from cleaning, meal prep, laundry, and shopping. Personal factors including 3+ comorbidities: lupus, L1 compression fx LE arthritis, knee arthritis   are also affecting patient's functional outcome. Patient will benefit from skilled PT to address above impairments and improve overall function.   REHAB POTENTIAL: Good   CLINICAL DECISION MAKING: Evolving/moderate complexity declining ability to stand and perform ADL's    EVALUATION COMPLEXITY: Moderate     GOALS: Goals reviewed with patient? Yes   SHORT TERM GOALS:   STG Name Target Date Goal status  1 Patient will increase bilateral gross hip strength by 5 lbs  Baseline:  10/22/2021 10/22/2021 Not tested bu strength improving per visual inspection   2 Patient will demonstrate full rotation bilateral without pain  Baseline:  10/22/2021 1/3 noticing improved rotation from a car   3 Patient will increase lumbar flexion by 20 degrees  Baseline: 10/22/2021 Reports functional improvement but not measured today.   dLONG TERM GOALS:    LTG Name Target Date Goal status  1 Patient will bend to pick  items off the ground without pain  Baseline: 11/26/2021 INITIAL  2 Patient will ambulate 3000' without increased pain in order to perform ADL's  Baseline: 11/26/2021 INITIAL  3 Patient will stand for 2 hours without increased pain in order to perfrom ADL's  Baseline: 11/26/2021 INITIAL  PLAN: PT FREQUENCY: 2x/week   PT DURATION: 8 weeks   PLANNED INTERVENTIONS: Therapeutic exercises, Therapeutic activity, Neuro Muscular re-education, Gait training, Patient/Family education, Joint mobilization, Aquatic Therapy, Dry Needling, Electrical stimulation, Cryotherapy, Moist heat, Taping, and Manual therapy   PLAN FOR NEXT SESSION: Begin core and postural strengthening. Review self soft tissue mobilization; review stretches and add stretches as needed. Consider rows with band and extensions. Patient has never exercises much but she is active so progress the exercise program at a progressive level; Conider sitting and standing exercises as she has some difficulty getting in and out of the position. Continue with posterior chain strengthening in the pool             Carney Living PT DPT  10/31/2021, 8:57 PM

## 2021-11-02 ENCOUNTER — Ambulatory Visit (HOSPITAL_BASED_OUTPATIENT_CLINIC_OR_DEPARTMENT_OTHER): Payer: Medicare Other | Admitting: Physical Therapy

## 2021-11-02 ENCOUNTER — Other Ambulatory Visit: Payer: Self-pay

## 2021-11-02 DIAGNOSIS — M545 Low back pain, unspecified: Secondary | ICD-10-CM

## 2021-11-02 DIAGNOSIS — R252 Cramp and spasm: Secondary | ICD-10-CM

## 2021-11-02 DIAGNOSIS — G8929 Other chronic pain: Secondary | ICD-10-CM

## 2021-11-02 NOTE — Therapy (Addendum)
OUTPATIENT PHYSICAL THERAPY TREATMENT NOTE/progress note      Patient Name: [name] MRN: [id number] DOB:09-06-1946, 76 y.o., female Today's Date: 10/27/2021  PCP: Cari Caraway, MD REFERRING PROVIDER: Cari Caraway, MD  Progress Note Reporting Period 09/29/2022 to 10/27/2021  See note below for Objective Data and Assessment of Progress/Goals.       PT End of Session - 11/03/21 1416     Visit Number 7    Number of Visits 16    Date for PT Re-Evaluation 11/24/21    Authorization Type Lumbar    PT Start Time 1315    PT Stop Time 1358    PT Time Calculation (min) 43 min    Activity Tolerance Patient tolerated treatment well    Behavior During Therapy WFL for tasks assessed/performed               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   Sjogren's syndrome ([place])    Systemic lupus ([place])    Past Surgical History:  Procedure Laterality Date   APPENDECTOMY     BUNIONECTOMY  2008   right foot   CATARACT EXTRACTION     COLONOSCOPY W/ POLYPECTOMY  Aug 27, 2011   ENDOMETRIAL ABLATION     FOOT SURGERY Right 12/17/2017   bone spur   ingrown toenail Right 12/16/2018   LAPAROSCOPIC APPENDECTOMY  10/11/2011   Procedure: APPENDECTOMY LAPAROSCOPIC;  Surgeon: Adin Hector, MD;  Location: [facility];  Service: General;  Laterality: N/A;  Partial Cecectomy and Appendectomy   WISDOM TOOTH EXTRACTION     Patient Active Problem List   Diagnosis Date Noted   Encounter for counseling 09/09/2018   Osteopenia of multiple sites 08/22/2017   History of vitamin D deficiency 08/22/2017   Autoimmune disease ([place]) 10/23/2016   Sjogren's syndrome ([place]) 10/09/2016    Primary osteoarthritis of both hands 10/09/2016   Primary osteoarthritis of both knees 10/09/2016   High risk medication use 09/12/2016   SBO (small bowel obstruction), partial postop 10/16/2011   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 It hurts most when she rotates. It is not all the time.   PERTINENT HISTORY:  Osteopenia, L1 compression fx, OA of the knees and lumbar spine, PF and bone sur in the right foot with bone spur removal, raynaud, lupus Sjogrens    PAIN:  Are you having pain? Yes 1/10 VAS scale: 2/10 no pain right now. The pain comes at night  Pain location: right and left knee   Pain orientation: Right and Left  PAIN TYPE: aching Pain description: intermittent  Aggravating factors: as the day progresses  Relieving factors: rest    PRECAUTIONS: Other: osteopenia    WEIGHT BEARING RESTRICTIONS No   FALLS:  Has patient fallen in last 6 months? 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    PLOF: Independent   PATIENT GOALS    To be able to resume her normal activity all day without pain      OBJECTIVE:     LUMBARAROM/PROM   A/PROM A/PROM  10/01/2021  Flexion 80 no  increase in pain   Extension    Right lateral flexion    Left lateral flexion    Right rotation 50% limited   Left rotation 50% limited    (Blank rows = not tested)     MMT Right 10/01/2021 Left 10/01/2021  Hip flexion 13.4 1/12 20.5 10.9 1/12 22  Hip extension      Hip abduction 18.9  26.7 13.8  22.8  Hip adduction      Hip internal rotation      Hip external rotation      Knee flexion      Knee extension 31.3 31.2  Ankle dorsiflexion      Ankle plantarflexion      Ankle inversion      Ankle eversion       (Blank rows = not tested)     TODAY'S TREATMENT  1/12  Annual: side lying rib springing; No reproduction of pain.  Trigger point release to t-12 to L3   Side lying open book right only x10   Bilateral er red 2x10  Horizontal abduction 2x10 red   Scap retraction 2x10 red  Shoulder extension 2x10 red   Hip abduction red 2x10      1/10 Pt seen for aquatic therapy today.  Treatment took place in water 3.25-4 ft in depth at the [facility] pool. Temp of water was 91.  Pt entered/exited the pool via stairs (step through pattern) independently with bilat rail.    Warm up: heel/toe walking x4 laps across pool chest deep side stepping x4 laps from shallow to deep; 2 laps side step ith stretch     board trunk flexion x10 lateral board rotation x10 each way seated    Board press x20   Weight press rainbow 2x10  Weight extension 2x10 Weight winging 2x10    1/6 Pt seen for aquatic therapy today.  Treatment took place in water 3.25-4 ft in depth at the [facility] pool. Temp of water was 91.  Pt entered/exited the pool via stairs (step through pattern) independently with bilat rail.       Warm up: heel/toe walking x4 laps across pool chest deep side stepping x4 laps from  shallow to deep; 2 laps side step ith stretch  Ambulation with noodle support: long strides, march  Exercises; Slow march x15; ; hip extension x10; hip abduction  x15; heel raise x20     board trunk flexion x10 lateral board rotation x10 each way seated    Board press x20    LAQ 2x10   Weight press rainbow x20  Weight extension x15  Weight winging x15        Pt requires buoyancy for support and to offload joints with strengthening exercises. Viscosity of the water is needed for resistance of strengthening; water current perturbations provides challenge to standing balance unsupported, requiring increased core activation.     PATIENT EDUCATION:  Education details:UE ore strengthening exercise technique   benefits of core and postural exercises  Person educated: Patient Education method: Explanation, Demonstration, Tactile cues, Verbal cues, and Handouts Education comprehension: verbalized understanding, returned demonstration, verbal cues required, tactile cues required, and needs further education     HOME EXERCISE PROGRAM: Access Code: [email or web address] URL: [email or web address] Date: 10/01/2021 Prepared by: Carolyne Littles   Exercises Supine Lower Trunk Rotation - 1 x daily - 7 x weekly - 3 sets - 10 reps Shoulder External Rotation and Scapular Retraction with Resistance - 1 x daily - 7 x weekly - 3 sets - 10 reps Seated Hip Abduction with Resistance - 1 x daily - 7 x weekly - 3 sets - 10 reps Theracane Over Shoulder - 1 x daily - 7 x weekly - 3 sets - 10 reps     ASSESSMENT:   CLINICAL IMPRESSION: Therapy performed PA and rib springing around the Lower floating rib. We were unable to reproduce the pain. Therapy updated her land program and reviewed the exercises she already has for the land. She is making great progress. Her strength measures have improved significantly. Her lumbar flexion has improved 30 degrees. She is having less pain getting in and out of the car and with end range rotation. We will continue 2W6.   Objective impairments include decreased activity tolerance, decreased endurance, decreased mobility,  decreased ROM, decreased strength, increased fascial restrictions, increased muscle spasms, and pain. These impairments are limiting patient from cleaning, meal prep, laundry, and shopping. Personal factors including 3+ comorbidities: lupus, L1 compression fx LE arthritis, knee arthritis   are also affecting patient's functional outcome. Patient will benefit from skilled PT to address above impairments and improve overall function.   REHAB POTENTIAL: Good   CLINICAL DECISION MAKING: Evolving/moderate complexity declining ability to stand and perform ADL's    EVALUATION COMPLEXITY: Moderate     GOALS: Goals reviewed with patient? Yes   SHORT TERM GOALS:   STG Name Target Date Goal status  1 Patient will increase bilateral gross hip strength by 5 lbs  Baseline:  10/22/2021 10/22/2021 Not tested bu strength improving per visual inspection   2 Patient will demonstrate full rotation bilateral without pain  Baseline:  10/22/2021 1/3 noticing improved rotation from a car   3 Patient will increase lumbar flexion by 20 degrees  Baseline: 10/22/2021 Reports functional improvement but not measured today.   dLONG TERM GOALS:    LTG Name Target Date Goal status  1 Patient will bend to pick items off the ground without pain  Baseline: 11/26/2021 INITIAL  2 Patient will ambulate 3000' without increased pain in order to perform ADL's  Baseline: 11/26/2021 INITIAL  3 Patient will stand for 2 hours without increased pain in order to perfrom ADL's  Baseline: 11/26/2021  INITIAL  PLAN: PT FREQUENCY: 2x/week   PT DURATION: 8 weeks   PLANNED INTERVENTIONS: Therapeutic exercises, Therapeutic activity, Neuro Muscular re-education, Gait training, Patient/Family education, Joint mobilization, Aquatic Therapy, Dry Needling, Electrical stimulation, Cryotherapy, Moist heat, Taping, and Manual therapy   PLAN FOR NEXT SESSION: Begin core and postural strengthening. Review self soft tissue mobilization; review stretches and  add stretches as needed. Consider rows with band and extensions. Patient has never exercises much but she is active so progress the exercise program at a progressive level; Conider sitting and standing exercises as she has some difficulty getting in and out of the position. Continue with posterior chain strengthening in the pool             Carney Living PT DPT  11/03/2021, 2:18 PM

## 2021-11-03 ENCOUNTER — Encounter (HOSPITAL_BASED_OUTPATIENT_CLINIC_OR_DEPARTMENT_OTHER): Payer: Self-pay | Admitting: Physical Therapy

## 2021-11-07 ENCOUNTER — Ambulatory Visit (HOSPITAL_BASED_OUTPATIENT_CLINIC_OR_DEPARTMENT_OTHER): Payer: Medicare Other | Admitting: Physical Therapy

## 2021-11-07 ENCOUNTER — Other Ambulatory Visit: Payer: Self-pay

## 2021-11-07 ENCOUNTER — Encounter (HOSPITAL_BASED_OUTPATIENT_CLINIC_OR_DEPARTMENT_OTHER): Payer: Self-pay | Admitting: Physical Therapy

## 2021-11-07 DIAGNOSIS — M545 Low back pain, unspecified: Secondary | ICD-10-CM | POA: Diagnosis not present

## 2021-11-07 DIAGNOSIS — G8929 Other chronic pain: Secondary | ICD-10-CM

## 2021-11-07 DIAGNOSIS — R252 Cramp and spasm: Secondary | ICD-10-CM

## 2021-11-07 NOTE — Therapy (Signed)
OUTPATIENT PHYSICAL THERAPY TREATMENT NOTE   Patient Name: [name] MRN: [id number] DOB:Apr 21, 1946, 76 y.o., female Today's Date: 10/27/2021  PCP: Cari Caraway, MD REFERRING PROVIDER: Cari Caraway, MD   PT End of Session - 11/07/21 1208     Visit Number 8    Number of Visits 19    Date for PT Re-Evaluation 12/15/21    Authorization Type Lumbar    PT Start Time 1205    PT Stop Time 1245    PT Time Calculation (min) 40 min    Activity Tolerance Patient tolerated treatment well    Behavior During Therapy [facility] for tasks assessed/performed               Past Medical History:  Diagnosis Date   Allergy    Colon polyp    Compression fracture of L1 lumbar vertebra ([place])    dx by ortho   Environmental allergies    Hypothyroid    Osteoarthritis    Osteoarthritis    Osteoarthritis    Osteopenia    Plantar fasciitis, right    Raynauds phenomenon    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   Sjogren's syndrome ([place])    Systemic lupus ([place])    Past Surgical History:  Procedure Laterality Date   APPENDECTOMY     BUNIONECTOMY  2008   right foot   CATARACT EXTRACTION     COLONOSCOPY W/ POLYPECTOMY  Aug 27, 2011   ENDOMETRIAL ABLATION     FOOT SURGERY Right 12/17/2017   bone spur   ingrown toenail Right 12/16/2018   LAPAROSCOPIC APPENDECTOMY  10/11/2011   Procedure: APPENDECTOMY LAPAROSCOPIC;  Surgeon: Adin Hector, MD;  Location: [facility];  Service: General;  Laterality: N/A;  Partial Cecectomy and Appendectomy   WISDOM TOOTH EXTRACTION     Patient Active Problem List   Diagnosis Date Noted   Encounter for counseling 09/09/2018   Osteopenia of multiple sites 08/22/2017   History of vitamin D deficiency 08/22/2017   Autoimmune disease ([place]) 10/23/2016   Sjogren's syndrome ([place]) 10/09/2016   Primary osteoarthritis of both hands 10/09/2016   Primary osteoarthritis of both knees 10/09/2016   High risk medication use 09/12/2016   SBO (small bowel  obstruction), partial postop 10/16/2011   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   BILIARY DYSKINESIA, 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 Yes 1/10 VAS scale: 2/10  Pain location: right and left knee   Pain orientation: Right and Left  PAIN TYPE: aching Pain description: intermittent  Aggravating factors: as the day progresses  Relieving factors: rest    PRECAUTIONS: Other: osteopenia    WEIGHT BEARING RESTRICTIONS No   FALLS:  Has patient fallen in last 6 months? 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    PLOF: Independent   PATIENT GOALS    To be able to resume her normal activity all day without pain      OBJECTIVE:     LUMBARAROM/PROM   A/PROM A/PROM  10/01/2021  Flexion 80 no increase in pain   Extension    Right lateral flexion    Left lateral flexion    Right rotation 50% limited   Left rotation 50% limited    (Blank rows = not tested)     MMT Right 10/01/2021 Left 10/01/2021  Hip flexion 13.4 1/12 20.5 10.9 1/12 22  Hip extension      Hip abduction 18.9  26.7  13.8  22.8  Hip adduction      Hip internal rotation      Hip external rotation      Knee flexion      Knee extension 31.3 31.2  Ankle dorsiflexion      Ankle plantarflexion      Ankle inversion      Ankle eversion       (Blank rows = not tested)     TODAY'S TREATMENT  1/17  Pt seen for aquatic therapy today.  Treatment took place in water 3.25-4 ft in depth at the [facility] pool. Temp of water was 91.  Pt entered/exited the pool via stairs (step through pattern) independently with bilat rail.    Warm up: heel/toe walking x4 laps across pool chest deep side stepping x4 laps from shallow to deep; 2 laps side step ith stretch   LE long leg exercises: ue supported by yellow hand buoys -hip flex; add/abd;hip extension and slow marching x10-12  Hip hinges 2x10   Buoyancy ball push down x10 (cues for tightened core) -push down with (lift) shoulder extension above head x10 -rotation on surface then added resistance of submerged x10 -push/pull x 10  Introduced Plank  for core strengthening ue supported on bench with hip extension(post chain) x5-6 -plank holding to yellow noodle completed with assistance to gain improved core engagement  Pt requires buoyancy for support and to offload joints with strengthening exercises. Viscosity of the water is needed for resistance of strengthening; water current perturbations provides challenge to standing balance unsupported, requiring increased core activation.     1/12  Annual: side lying rib springing; No reproduction of pain.  Trigger point release to t-12 to L3   Side lying open book right only x10   Bilateral er red 2x10  Horizontal abduction 2x10 red   Scap retraction 2x10 red  Shoulder extension 2x10 red   Hip abduction red 2x10   Pt requires buoyancy for support and to offload joints  with strengthening exercises. Viscosity of the water is needed for resistance of strengthening; water current perturbations  provides challenge to standing balance unsupported, requiring increased core activation.   1/10 Pt seen for aquatic therapy today.  Treatment took place in water 3.25-4 ft in depth at the [facility] pool. Temp of water was 91.  Pt entered/exited the pool via stairs (step through pattern) independently with bilat rail.    Warm up: heel/toe walking x4 laps across pool chest deep side stepping x4 laps from shallow to deep; 2 laps side step ith stretch     board trunk flexion x10 lateral board rotation x10 each way seated    Board press x20   Weight press rainbow 2x10  Weight extension 2x10 Weight winging 2x10    1/6 Pt seen for aquatic therapy today.  Treatment took place in water 3.25-4 ft in depth at the [facility] pool. Temp of water was 91.  Pt entered/exited the pool via stairs (step through pattern) independently with bilat rail.       Warm up: heel/toe walking x4 laps across pool chest deep side stepping x4 laps from shallow to deep; 2 laps side step ith stretch  Ambulation with noodle support: long strides, march  Exercises; Slow march x15; ; hip extension x10; hip abduction x15; heel raise x20     board trunk flexion x10 lateral board rotation x10 each way seated    Board press x20    LAQ 2x10   Weight press rainbow x20  Weight extension x15  Weight winging x15        Pt requires buoyancy for support and to offload joints with strengthening exercises. Viscosity of the water is needed for resistance of strengthening; water current perturbations provides challenge to standing balance unsupported, requiring increased core activation.     PATIENT EDUCATION:  Education details:UE ore strengthening exercise technique   benefits of core and postural exercises  Person educated: Patient Education method: Explanation, Demonstration, Tactile cues, Verbal cues, and Handouts Education comprehension: verbalized understanding, returned demonstration,  verbal cues required, tactile cues required, and needs further education     HOME EXERCISE PROGRAM: Access Code: [email or web address] URL: [email or web address] Date: 10/01/2021 Prepared by: Carolyne Littles   Exercises Supine Lower Trunk Rotation - 1 x daily - 7 x weekly - 3 sets - 10 reps Shoulder External Rotation and Scapular Retraction with Resistance - 1 x daily - 7 x weekly - 3 sets - 10 reps Seated Hip Abduction with Resistance - 1 x daily - 7 x weekly - 3 sets - 10 reps Theracane Over Shoulder - 1 x daily - 7 x weekly - 3 sets - 10 reps     ASSESSMENT:   CLINICAL IMPRESSION: Added use of buoyancy ball for increased resistance with core strengthening and introduced plank for upper level core stabilization challenge. Pt tolerated without increased sx.  She is challenged demonstrating increased effort and mindfulness with exercises needing extra time/reps and assistance for proper execution. Cues for glut tightness throughout increasing posterior chain engagement.  Objective impairments include decreased activity tolerance, decreased endurance, decreased mobility, decreased ROM, decreased strength, increased fascial restrictions, increased muscle spasms, and pain. These impairments are limiting patient from cleaning, meal prep, laundry, and shopping. Personal factors including 3+ comorbidities: lupus, L1 compression fx LE arthritis, knee arthritis   are also affecting patient's functional outcome. Patient will benefit from skilled PT to address above impairments and improve overall function.   REHAB POTENTIAL: Good   CLINICAL DECISION  MAKING: Evolving/moderate complexity declining ability to stand and perform ADL's    EVALUATION COMPLEXITY: Moderate     GOALS: Goals reviewed with patient? Yes   SHORT TERM GOALS:   STG Name Target Date Goal status  1 Patient will increase bilateral gross hip strength by 5 lbs  Baseline:  10/22/2021 10/22/2021 Not tested bu strength improving per  visual inspection   2 Patient will demonstrate full rotation bilateral without pain  Baseline:  10/22/2021 1/3 noticing improved rotation from a car   3 Patient will increase lumbar flexion by 20 degrees  Baseline: 10/22/2021 Reports functional improvement but not measured today.   dLONG TERM GOALS:    LTG Name Target Date Goal status  1 Patient will bend to pick items off the ground without pain  Baseline: 11/26/2021 INITIAL  2 Patient will ambulate 3000' without increased pain in order to perform ADL's  Baseline: 11/26/2021 INITIAL  3 Patient will stand for 2 hours without increased pain in order to perfrom ADL's  Baseline: 11/26/2021 INITIAL  PLAN: PT FREQUENCY: 2x/week   PT DURATION: 8 weeks   PLANNED INTERVENTIONS: Therapeutic exercises, Therapeutic activity, Neuro Muscular re-education, Gait training, Patient/Family education, Joint mobilization, Aquatic Therapy, Dry Needling, Electrical stimulation, Cryotherapy, Moist heat, Taping, and Manual therapy   PLAN FOR NEXT SESSION: Begin core and postural strengthening. Review self soft tissue mobilization; review stretches and add stretches as needed. Consider rows with band and extensions. Patient has never exercises much but she is active so progress the exercise program at a progressive level; Conider sitting and standing exercises as she has some difficulty getting in and out of the position. Continue with posterior chain strengthening in the pool             Vedia Pereyra MPT 11/07/2021, 1:08 PM

## 2021-11-09 ENCOUNTER — Encounter (HOSPITAL_BASED_OUTPATIENT_CLINIC_OR_DEPARTMENT_OTHER): Payer: Self-pay

## 2021-11-09 ENCOUNTER — Encounter (HOSPITAL_BASED_OUTPATIENT_CLINIC_OR_DEPARTMENT_OTHER): Payer: Self-pay | Admitting: Physical Therapy

## 2021-11-09 ENCOUNTER — Ambulatory Visit (HOSPITAL_BASED_OUTPATIENT_CLINIC_OR_DEPARTMENT_OTHER): Payer: Medicare Other | Admitting: Physical Therapy

## 2021-11-09 DIAGNOSIS — M545 Low back pain, unspecified: Secondary | ICD-10-CM

## 2021-11-09 DIAGNOSIS — R252 Cramp and spasm: Secondary | ICD-10-CM

## 2021-11-13 ENCOUNTER — Encounter (HOSPITAL_BASED_OUTPATIENT_CLINIC_OR_DEPARTMENT_OTHER): Payer: Self-pay

## 2021-11-13 ENCOUNTER — Ambulatory Visit (HOSPITAL_BASED_OUTPATIENT_CLINIC_OR_DEPARTMENT_OTHER): Payer: Medicare Other | Admitting: Physical Therapy

## 2021-11-16 ENCOUNTER — Ambulatory Visit (HOSPITAL_BASED_OUTPATIENT_CLINIC_OR_DEPARTMENT_OTHER): Payer: Self-pay | Admitting: Physical Therapy

## 2021-11-20 ENCOUNTER — Ambulatory Visit (AMBULATORY_SURGERY_CENTER): Payer: Medicare Other | Admitting: *Deleted

## 2021-11-20 ENCOUNTER — Other Ambulatory Visit: Payer: Self-pay

## 2021-11-20 VITALS — Ht 65.0 in | Wt 144.0 lb

## 2021-11-20 DIAGNOSIS — Z86010 Personal history of colonic polyps: Secondary | ICD-10-CM

## 2021-11-20 MED ORDER — NA SULFATE-K SULFATE-MG SULF 17.5-3.13-1.6 GM/177ML PO SOLN: 1.0000 | ORAL | 0 refills | Status: DC

## 2021-11-20 NOTE — Progress Notes (Signed)
Patient's pre-visit was done today over the phone with the patient. Name,DOB and address verified. Patient denies any allergies to Eggs and Soy. Patient denies any problems with anesthesia/sedation. Patient is not taking any diet pills or blood thinners. No home Oxygen. Pt is having cough and congestion-she will call us back if needed to postpone procedure.  Prep instructions sent to pt's MyChart-pt aware. Patient understands to call us back with any questions or concerns. Patient is aware of our care-partner policy and [id number] safety protocol.   EMMI education assigned to the patient for the procedure, sent to [place].   The patient is COVID-19 vaccinated.

## 2021-11-23 ENCOUNTER — Ambulatory Visit (HOSPITAL_BASED_OUTPATIENT_CLINIC_OR_DEPARTMENT_OTHER): Payer: Medicare Other | Attending: Family Medicine | Admitting: Physical Therapy

## 2021-11-23 ENCOUNTER — Encounter (HOSPITAL_BASED_OUTPATIENT_CLINIC_OR_DEPARTMENT_OTHER): Payer: Self-pay | Admitting: Physical Therapy

## 2021-11-23 ENCOUNTER — Other Ambulatory Visit: Payer: Self-pay

## 2021-11-23 DIAGNOSIS — G8929 Other chronic pain: Secondary | ICD-10-CM | POA: Diagnosis present

## 2021-11-23 DIAGNOSIS — R252 Cramp and spasm: Secondary | ICD-10-CM | POA: Diagnosis present

## 2021-11-23 DIAGNOSIS — M545 Low back pain, unspecified: Secondary | ICD-10-CM | POA: Insufficient documentation

## 2021-11-23 NOTE — Therapy (Signed)
OUTPATIENT PHYSICAL THERAPY TREATMENT NOTE   Patient Name: [name] MRN: [id number] DOB:03-Jun-1946, 76 y.o., female Today's Date: 10/27/2021  PCP: Cari Caraway, MD REFERRING PROVIDER: Cari Caraway, MD   PT End of Session - 11/23/21 1331     Visit Number 9    Number of Visits 19    Date for PT Re-Evaluation 12/15/21    Authorization Type Lumbar    PT Start Time 1315    PT Stop Time 1357    PT Time Calculation (min) 42 min    Activity Tolerance Patient tolerated treatment well    Behavior During Therapy [facility] for tasks assessed/performed                Past Medical History:  Diagnosis Date   Allergy    Colon polyp    Compression fracture of L1 lumbar vertebra ([place])    dx by ortho   Environmental allergies    Hypothyroid    Osteoarthritis    Osteoarthritis    Osteoarthritis    Osteopenia    Plantar fasciitis, right    Raynauds phenomenon    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   Sjogren's syndrome ([place])    Systemic lupus ([place])    Past Surgical History:  Procedure Laterality Date   APPENDECTOMY     BUNIONECTOMY  2008   right foot   CATARACT EXTRACTION     COLONOSCOPY  12/05/2016   Dr.Jacobs   COLONOSCOPY W/ POLYPECTOMY  08/27/2011   ENDOMETRIAL ABLATION     FOOT SURGERY Right 12/17/2017   bone spur   ingrown toenail Right 12/16/2018   LAPAROSCOPIC APPENDECTOMY  10/11/2011   Procedure: APPENDECTOMY LAPAROSCOPIC;  Surgeon: Adin Hector, MD;  Location: [facility];  Service: General;  Laterality: N/A;  Partial Cecectomy and Appendectomy   POLYPECTOMY     WISDOM TOOTH EXTRACTION     Patient Active Problem List   Diagnosis Date Noted   Encounter for counseling 09/09/2018   Osteopenia of multiple sites 08/22/2017   History of vitamin D deficiency 08/22/2017   Autoimmune disease ([place]) 10/23/2016   Sjogren's syndrome ([place]) 10/09/2016   Primary osteoarthritis of both hands 10/09/2016   Primary osteoarthritis of both knees 10/09/2016    High risk medication use 09/12/2016   SBO (small bowel obstruction), partial postop 10/16/2011   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 She reports her back has been about the same. She had more pain this morning, but she was doing a project at the computer last night.  PERTINENT HISTORY:  Osteopenia, L1 compression fx, OA of the knees and lumbar spine, PF and bone sur in the right foot with bone spur removal, raynaud, lupus Sjogrens    PAIN:  Are you having pain? Yes 1/10 VAS scale: 2/10  Pain location: right and left knee   Pain orientation: Right and Left  PAIN TYPE: aching Pain description: intermittent  Aggravating factors: as the day progresses  Relieving factors: rest    PRECAUTIONS: Other: osteopenia    WEIGHT BEARING RESTRICTIONS No   FALLS:  Has patient fallen in last 6 months? 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    PLOF: Independent   PATIENT GOALS    To be able to resume her normal activity all day without pain      OBJECTIVE:     LUMBARAROM/PROM   A/PROM A/PROM  10/01/2021  Flexion 80 no increase in pain   Extension    Right lateral flexion    Left lateral flexion    Right  rotation 50% limited   Left rotation 50% limited    (Blank rows = not tested)     MMT Right 10/01/2021 Left 10/01/2021  Hip flexion 13.4 1/12 20.5 10.9 1/12 22  Hip extension      Hip abduction 18.9  26.7 13.8  22.8  Hip adduction      Hip internal rotation      Hip external rotation      Knee flexion      Knee extension 31.3 31.2  Ankle dorsiflexion      Ankle plantarflexion      Ankle inversion      Ankle eversion       (Blank rows = not tested)     TODAY'S TREATMENT  2/2  SLR 2x10  Supine march 2x10  Supine hip abdcution with breathing 2x10 red   Seated March 3x10 red with cuing for posture  LAQ 3x10 each leg with cuing for posture Hip abduction 3x10 red with cuing for posture.    1/17  Pt seen for aquatic therapy today.  Treatment took place in water 3.25-4 ft in depth at the [facility] pool. Temp of water was 91.  Pt entered/exited the pool via stairs (step through pattern) independently with bilat rail.    Warm up: heel/toe walking x4 laps across pool chest deep side stepping x4 laps from shallow to deep; 2 laps side step ith stretch   LE long leg exercises: ue supported by yellow hand buoys -hip flex; add/abd;hip extension and slow marching x10-12  Hip hinges 2x10   Buoyancy ball push down x10 (cues for tightened core) -push down with (lift) shoulder extension above head x10 -rotation on surface then added resistance of submerged x10 -push/pull x 10  Introduced Plank  for core strengthening ue supported on bench with hip extension(post chain) x5-6 -plank holding to yellow noodle completed with assistance to gain improved core engagement  Pt requires buoyancy for support and to offload joints with strengthening exercises. Viscosity of the water is needed for resistance of strengthening; water current perturbations provides challenge  to standing balance unsupported, requiring increased core activation.          Pt requires buoyancy  for support and to offload joints with strengthening exercises. Viscosity of the water is needed for resistance of strengthening; water current perturbations provides challenge to standing balance unsupported, requiring increased core activation.     PATIENT EDUCATION:  Education details:UE ore strengthening exercise technique   benefits of core and postural exercises  Person educated: Patient Education method: Explanation, Demonstration, Tactile cues, Verbal cues, and Handouts Education comprehension: verbalized understanding, returned demonstration, verbal cues required, tactile cues required, and needs further education     HOME EXERCISE PROGRAM: Access Code: [email or web address] URL: [email or web address] Date: 10/01/2021 Prepared by: Carolyne Littles   Exercises Supine Lower Trunk Rotation - 1 x daily - 7 x weekly - 3 sets - 10 reps Shoulder External Rotation and Scapular Retraction with Resistance - 1 x daily - 7 x weekly - 3 sets - 10 reps Seated Hip Abduction with Resistance - 1 x daily - 7 x weekly - 3 sets - 10 reps Theracane Over Shoulder - 1 x daily - 7 x weekly - 3 sets - 10 reps     ASSESSMENT:   CLINICAL IMPRESSION: Patient had mild tenderness to palpation in her paraspinals around the compression fx area. Therapy performed manual therapy to that area to improve overall movement in her spine. We updated her HEP and gave her more exercises fr her LE from a sitting position. She tolerated well. Her next visit may be on land or water depending on how she is feeling.    Objective impairments include decreased activity tolerance, decreased endurance, decreased mobility, decreased ROM, decreased strength, increased fascial restrictions, increased muscle spasms, and pain. These impairments are limiting patient from cleaning, meal prep, laundry, and shopping. Personal factors including 3+ comorbidities: lupus, L1 compression fx LE arthritis, knee arthritis   are also affecting  patient's functional outcome. Patient will benefit from skilled PT to address above impairments and improve overall function.   REHAB POTENTIAL: Good   CLINICAL DECISION MAKING: Evolving/moderate complexity declining ability to stand and perform ADL's    EVALUATION COMPLEXITY: Moderate     GOALS: Goals reviewed with patient? Yes   SHORT TERM GOALS:   STG Name Target Date Goal status  1 Patient will increase bilateral gross hip strength by 5 lbs  Baseline:  10/22/2021 2/3 Improved significantly last time tested Achieved   2 Patient will demonstrate full rotation bilateral without pain  Baseline:  10/22/2021 2/3 noticing improved rotation with LTR   3 Patient will increase lumbar flexion by 20 degrees  Baseline: 10/22/2021 Reports functional improvement but not measured today.   dLONG TERM GOALS:    LTG Name Target Date Goal status  1 Patient will bend to pick items off the ground without pain  Baseline: 11/26/2021 INITIAL  2 Patient will ambulate 3000' without increased pain in order to perform ADL's  Baseline: 11/26/2021 INITIAL  3 Patient will stand for 2 hours without increased pain in order to perfrom ADL's  Baseline: 11/26/2021 INITIAL  PLAN: PT FREQUENCY: 2x/week   PT DURATION: 8 weeks   PLANNED INTERVENTIONS: Therapeutic exercises, Therapeutic activity, Neuro Muscular re-education, Gait training, Patient/Family education, Joint mobilization, Aquatic Therapy, Dry Needling, Electrical stimulation, Cryotherapy, Moist heat, Taping, and Manual therapy   PLAN FOR NEXT SESSION: Begin core and postural strengthening. Review self soft tissue mobilization; review stretches and add stretches as needed. Consider rows with band and extensions. Patient has never  exercises much but she is active so progress the exercise program at a progressive level; Conider sitting and standing exercises as she has some difficulty getting in and out of the position. Continue with posterior chain strengthening in  the pool             Carney Living PT DPT  11/24/2021, 11:24 AM

## 2021-11-24 ENCOUNTER — Telehealth: Payer: Self-pay | Admitting: Gastroenterology

## 2021-11-24 ENCOUNTER — Encounter (HOSPITAL_BASED_OUTPATIENT_CLINIC_OR_DEPARTMENT_OTHER): Payer: Self-pay | Admitting: Physical Therapy

## 2021-11-24 ENCOUNTER — Ambulatory Visit (HOSPITAL_BASED_OUTPATIENT_CLINIC_OR_DEPARTMENT_OTHER): Payer: Medicare Other | Admitting: Physical Therapy

## 2021-11-24 DIAGNOSIS — R252 Cramp and spasm: Secondary | ICD-10-CM

## 2021-11-24 DIAGNOSIS — M545 Low back pain, unspecified: Secondary | ICD-10-CM | POA: Diagnosis not present

## 2021-11-24 DIAGNOSIS — Z86010 Personal history of colonic polyps: Secondary | ICD-10-CM

## 2021-11-24 DIAGNOSIS — G8929 Other chronic pain: Secondary | ICD-10-CM

## 2021-11-24 MED ORDER — PEG 3350-KCL-NA BICARB-NACL 420 G PO SOLR: 4000.0000 mL | Freq: Once | ORAL | 0 refills | Status: AC

## 2021-11-24 NOTE — Telephone Encounter (Signed)
Patient called requesting to change her prep from Suprep to Golytely due to cost please advise.

## 2021-11-24 NOTE — Telephone Encounter (Signed)
Pt called states  Golytely is $4 and she prefers this over the  $40 Suprep  Sent in Golytely to her pharmacy, sent in new instructions via [place] over prep over the phone with pt   Lelan Pons PV

## 2021-11-24 NOTE — Therapy (Signed)
OUTPATIENT PHYSICAL THERAPY TREATMENT NOTE   Patient Name: [name] MRN: [id number] DOB:05-05-1946, 76 y.o., female Today's Date: 10/27/2021  PCP: Cari Caraway, MD REFERRING PROVIDER: Cari Caraway, MD   PT End of Session - 11/24/21 1704     Visit Number 10    Number of Visits 19    Date for PT Re-Evaluation 12/15/21    Authorization Type Lumbar    PT Start Time 1600    PT Stop Time 1771    PT Time Calculation (min) 43 min    Activity Tolerance Patient tolerated treatment well    Behavior During Therapy [facility] for tasks assessed/performed                 Past Medical History:  Diagnosis Date   Allergy    Colon polyp    Compression fracture of L1 lumbar vertebra ([place])    dx by ortho   Environmental allergies    Hypothyroid    Osteoarthritis    Osteoarthritis    Osteoarthritis    Osteopenia    Plantar fasciitis, right    Raynauds phenomenon    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   Sjogren's syndrome ([place])    Systemic lupus ([place])    Past Surgical History:  Procedure Laterality Date   APPENDECTOMY     BUNIONECTOMY  2008   right foot   CATARACT EXTRACTION     COLONOSCOPY  12/05/2016   Dr.Jacobs   COLONOSCOPY W/ POLYPECTOMY  08/27/2011   ENDOMETRIAL ABLATION     FOOT SURGERY Right 12/17/2017   bone spur   ingrown toenail Right 12/16/2018   LAPAROSCOPIC APPENDECTOMY  10/11/2011   Procedure: APPENDECTOMY LAPAROSCOPIC;  Surgeon: Adin Hector, MD;  Location: [facility];  Service: General;  Laterality: N/A;  Partial Cecectomy and Appendectomy   POLYPECTOMY     WISDOM TOOTH EXTRACTION     Patient Active Problem List   Diagnosis Date Noted   Encounter for counseling 09/09/2018   Osteopenia of multiple sites 08/22/2017   History of vitamin D deficiency 08/22/2017   Autoimmune disease ([place]) 10/23/2016   Sjogren's syndrome ([place]) 10/09/2016   Primary osteoarthritis of both hands 10/09/2016   Primary osteoarthritis of both knees 10/09/2016    High risk medication use 09/12/2016   SBO (small bowel obstruction), partial postop 10/16/2011   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  PERTINENT HISTORY:  Osteopenia, L1 compression fx, OA of the knees and lumbar spine, PF and bone sur in the right foot with bone spur removal, raynaud, lupus Sjogrens    PAIN:  Are you having pain? no 2/3 VAS scale: 0/10 no pain right now  Pain location: right and left knee   Pain orientation: Right and Left  PAIN TYPE: aching Pain description: intermittent  Aggravating factors: as the day progresses  Relieving factors: rest    PRECAUTIONS: Other: osteopenia    WEIGHT BEARING RESTRICTIONS No   FALLS:  Has patient fallen in last 6 months? 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    PLOF: Independent   PATIENT GOALS    To be able to resume her normal activity all day without pain      OBJECTIVE:     TODAY'S TREATMENT  2/3 Pt seen for aquatic therapy today.  Treatment took place in water 3.25-4 ft in depth at the [facility] pool. Temp of water was 91.  Pt entered/exited the pool via stairs (step through pattern)  independently with bilat rail.    Warm up: heel/toe walking x4 laps across pool chest deep side stepping x4 laps from shallow to deep; 2 laps side step ith stretch   -standing hip 3 way with cuing for technique - Balance exercises     - single leg stance 3x 15 sec hold each leg ( more diffculty on the left)     -heel  raise to toe up for balance x10  - rainbow weights:     - triceps x15 - extension x15  - winging x15  - squats with cuing for technique x20  - Seated     - LAQ x20     - march x20  - blue noodle x10         Pt requires buoyancy for support and to offload joints with strengthening exercises. Viscosity of the water is needed for resistance of strengthening; water current perturbations provides challenge to standing balance unsupported, requiring increased core activation.     2/2  SLR 2x10  Supine march 2x10  Supine hip abdcution with breathing 2x10 red   Seated March 3x10 red with cuing for posture  LAQ 3x10 each leg with cuing for posture Hip abduction 3x10 red with cuing for posture.    1/17  Pt seen for aquatic therapy today.  Treatment took place in water 3.25-4 ft in depth at the [facility] pool. Temp of water was 91.  Pt entered/exited the pool via stairs (step through pattern) independently with bilat rail.    Warm up: heel/toe walking x4 laps across pool chest deep side stepping x4 laps from shallow to deep; 2 laps side step ith stretch   LE long leg exercises: ue supported by yellow hand buoys -hip flex; add/abd;hip extension and slow marching x10-12  Hip hinges 2x10   Buoyancy ball push down x10 (cues for tightened core) -push down with (lift) shoulder extension above head x10 -rotation on surface then added resistance of submerged x10 -push/pull x 10  Introduced Plank  for core strengthening ue supported on bench with hip extension(post chain) x5-6 -plank holding to yellow noodle completed  with assistance to gain improved core  engagement  Pt requires buoyancy for support and to offload joints with strengthening exercises. Viscosity of the water is needed for resistance of strengthening; water current perturbations provides challenge to standing balance unsupported, requiring increased core activation.          Pt requires buoyancy for support and to offload joints with strengthening exercises. Viscosity of the water is needed for resistance of strengthening; water current perturbations provides challenge to standing balance unsupported, requiring increased core activation.     PATIENT EDUCATION:  Education details:UE ore strengthening exercise technique   benefits of core and postural exercises  Person educated: Patient Education method: Explanation, Demonstration, Tactile cues, Verbal cues, and Handouts Education comprehension: verbalized understanding, returned demonstration, verbal cues required, tactile cues required, and needs further education     HOME EXERCISE PROGRAM: Access Code: [email or web address] URL: [email or web address] Date: 10/01/2021 Prepared by: Carolyne Littles   Exercises Supine Lower Trunk Rotation - 1 x daily - 7 x weekly - 3 sets - 10 reps Shoulder External Rotation and Scapular Retraction with Resistance - 1 x daily - 7 x weekly - 3 sets - 10 reps Seated Hip Abduction with Resistance - 1 x daily - 7 x weekly - 3 sets - 10 reps Theracane Over Shoulder - 1 x daily - 7 x weekly - 3 sets - 10 reps     ASSESSMENT:   CLINICAL IMPRESSION: Therapy added in balance exercises and worked on squat technique. She was advised to monitor her symptoms over the next few days. We will continue to progress as tolerated.  Objective impairments include decreased activity tolerance, decreased endurance, decreased mobility, decreased ROM, decreased strength, increased fascial restrictions, increased muscle spasms, and pain. These impairments are limiting patient from cleaning, meal prep, laundry,  and shopping. Personal factors including 3+ comorbidities: lupus, L1 compression fx LE arthritis, knee arthritis   are also affecting patient's functional outcome. Patient will benefit from skilled PT to address above impairments and improve overall function.   REHAB POTENTIAL: Good   CLINICAL DECISION MAKING: Evolving/moderate complexity declining ability to stand and perform ADL's    EVALUATION COMPLEXITY: Moderate     GOALS: Goals reviewed with patient? Yes   SHORT TERM GOALS:   STG Name Target Date Goal status  1 Patient will increase bilateral gross hip strength by 5 lbs  Baseline:  10/22/2021 2/3 Improved significantly last time tested Achieved   2 Patient will demonstrate full rotation bilateral without pain  Baseline:  10/22/2021 2/3 noticing improved rotation with LTR   3 Patient will increase lumbar flexion by 20 degrees  Baseline: 10/22/2021 Reports functional improvement but not measured today.   dLONG TERM GOALS:    LTG Name Target Date Goal status  1 Patient will bend to pick items off the ground without pain  Baseline: 11/26/2021 INITIAL  2 Patient will ambulate 3000' without increased pain in order to perform ADL's  Baseline: 11/26/2021 INITIAL  3 Patient will stand for 2 hours without increased pain in order to perfrom ADL's  Baseline: 11/26/2021 INITIAL  PLAN: PT FREQUENCY: 2x/week   PT DURATION: 8 weeks   PLANNED INTERVENTIONS: Therapeutic exercises, Therapeutic activity, Neuro Muscular re-education, Gait training, Patient/Family education, Joint mobilization, Aquatic Therapy, Dry Needling, Electrical stimulation, Cryotherapy, Moist heat, Taping, and Manual therapy   PLAN FOR NEXT SESSION: Begin core and postural strengthening. Review self soft tissue mobilization; review stretches and add stretches as needed. Consider rows with band and extensions. Patient has never  exercises much but she is active so progress the exercise program at a progressive level; Conider sitting  and standing exercises as she has some difficulty getting in and out of the position. Continue with posterior chain strengthening in the pool             Carney Living PT DPT  11/24/2021, 5:06 PM

## 2021-11-27 ENCOUNTER — Other Ambulatory Visit: Payer: Self-pay | Admitting: Rheumatology

## 2021-11-28 ENCOUNTER — Ambulatory Visit (HOSPITAL_BASED_OUTPATIENT_CLINIC_OR_DEPARTMENT_OTHER): Payer: Medicare Other | Admitting: Physical Therapy

## 2021-11-28 ENCOUNTER — Encounter (HOSPITAL_BASED_OUTPATIENT_CLINIC_OR_DEPARTMENT_OTHER): Payer: Self-pay | Admitting: Physical Therapy

## 2021-11-28 ENCOUNTER — Other Ambulatory Visit: Payer: Self-pay

## 2021-11-28 DIAGNOSIS — G8929 Other chronic pain: Secondary | ICD-10-CM

## 2021-11-28 DIAGNOSIS — M545 Low back pain, unspecified: Secondary | ICD-10-CM | POA: Diagnosis not present

## 2021-11-28 DIAGNOSIS — R252 Cramp and spasm: Secondary | ICD-10-CM

## 2021-11-28 NOTE — Telephone Encounter (Signed)
Next Visit: 03/13/2022  Last Visit: 10/10/2021  Last Fill: 02/18/2021  Dx: History of depression  Current Dose per office note on 10/10/2021: not discussed  Okay to refill Lexapro?

## 2021-11-28 NOTE — Therapy (Signed)
OUTPATIENT PHYSICAL THERAPY TREATMENT NOTE   Patient Name: [name] MRN: [id number] DOB:01-03-1946, 76 y.o., female Today's Date: 10/27/2021  PCP: Cari Caraway, MD REFERRING PROVIDER: Cari Caraway, MD   PT End of Session - 11/28/21 1309     Visit Number 11    Number of Visits 19    Date for PT Re-Evaluation 12/15/21    Authorization Type Lumbar    PT Start Time 1303    PT Stop Time 1345    PT Time Calculation (min) 42 min    Activity Tolerance Patient tolerated treatment well    Behavior During Therapy [facility] for tasks assessed/performed                 Past Medical History:  Diagnosis Date   Allergy    Colon polyp    Compression fracture of L1 lumbar vertebra ([place])    dx by ortho   Environmental allergies    Hypothyroid    Osteoarthritis    Osteoarthritis    Osteoarthritis    Osteopenia    Plantar fasciitis, right    Raynauds phenomenon    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   Sjogren's syndrome ([place])    Systemic lupus ([place])    Past Surgical History:  Procedure Laterality Date   APPENDECTOMY     BUNIONECTOMY  2008   right foot   CATARACT EXTRACTION     COLONOSCOPY  12/05/2016   Dr.Jacobs   COLONOSCOPY W/ POLYPECTOMY  08/27/2011   ENDOMETRIAL ABLATION     FOOT SURGERY Right 12/17/2017   bone spur   ingrown toenail Right 12/16/2018   LAPAROSCOPIC APPENDECTOMY  10/11/2011   Procedure: APPENDECTOMY LAPAROSCOPIC;  Surgeon: Adin Hector, MD;  Location: [facility];  Service: General;  Laterality: N/A;  Partial Cecectomy and Appendectomy   POLYPECTOMY     WISDOM TOOTH EXTRACTION     Patient Active Problem List   Diagnosis Date Noted   Encounter for counseling 09/09/2018   Osteopenia of multiple sites 08/22/2017   History of vitamin D deficiency 08/22/2017   Autoimmune disease ([place]) 10/23/2016   Sjogren's syndrome ([place]) 10/09/2016   Primary osteoarthritis of both hands 10/09/2016   Primary osteoarthritis of both knees 10/09/2016    High risk medication use 09/12/2016   SBO (small bowel obstruction), partial postop 10/16/2011   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  PERTINENT HISTORY:  Osteopenia, L1 compression fx, OA of the knees and lumbar spine, PF and bone sur in the right foot with bone spur removal, raynaud, lupus Sjogrens    PAIN:  Are you having pain? no 2/3 VAS scale: 0/10 no pain right now  Pain location: right and left knee   Pain orientation: Right and Left  PAIN TYPE: aching Pain description: intermittent  Aggravating factors: as the day progresses  Relieving factors: rest    PRECAUTIONS: Other: osteopenia    WEIGHT BEARING RESTRICTIONS No   FALLS:  Has patient fallen in last 6 months? 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    PLOF: Independent   PATIENT GOALS    To be able to resume her normal activity all day without pain      OBJECTIVE:     TODAY'S TREATMENT  2/7 Manual: STM to bilateral paraspinals: grade I and II PA glides to mid and upper thoracic spine   LTR x10 each side;   Piriformis stretch 3x20 sec hold   Sink stretch 3x10 sec hold   Bilateral er  3x10 green   Standing:  Shoulder extension 3x10 green  Scap retraction 3x10 green  Bilateral er 3x10 green        2/3 Pt seen for aquatic therapy today.  Treatment took place in water 3.25-4 ft in depth at the [facility] pool. Temp of water was 91.  Pt entered/exited the pool via stairs (step through pattern) independently with bilat rail.    Warm up: heel/toe walking x4 laps across pool chest deep side stepping x4 laps from shallow to deep; 2 laps side step ith stretch   -standing hip 3 way with cuing for technique - Balance exercises     - single leg stance 3x 15 sec hold each leg ( more diffculty on the left)     -heel  raise to toe up for balance x10  - rainbow weights:     - triceps x15 - extension x15  - winging x15  - squats with cuing for technique x20  - Seated     - LAQ x20     - march x20  - blue noodle x10         Pt requires buoyancy for support and to offload joints with strengthening exercises. Viscosity of the water is needed for resistance of strengthening; water current perturbations provides challenge to standing balance unsupported, requiring increased core activation.     2/2  SLR 2x10  Supine march 2x10  Supine hip abdcution with breathing 2x10 red   Seated March 3x10 red with cuing for posture  LAQ 3x10 each leg with cuing for posture Hip abduction 3x10 red with cuing for posture.      PATIENT EDUCATION:  Education details:UE ore strengthening exercise technique   benefits of core and postural exercises  Person educated: Patient Education method: Explanation, Demonstration, Tactile cues, Verbal cues, and Handouts Education comprehension: verbalized understanding, returned demonstration, verbal cues required, tactile cues required, and needs further education     HOME EXERCISE PROGRAM: Access Code: [email or web address] URL: [email or web address] Date: 10/01/2021 Prepared by: Carolyne Littles   Exercises Supine Lower  Trunk Rotation - 1 x  daily - 7 x weekly - 3 sets - 10 reps Shoulder External Rotation and Scapular Retraction with Resistance - 1 x daily - 7 x weekly - 3 sets - 10 reps Seated Hip Abduction with Resistance - 1 x daily - 7 x weekly - 3 sets - 10 reps Theracane Over Shoulder - 1 x daily - 7 x weekly - 3 sets - 10 reps     ASSESSMENT:   CLINICAL IMPRESSION: Therapy added 2 more stretches today for her to work on [employer]. We also advanced her bacd to a green band. She had no significant increase in pain> We will continue to work on light manual therapy to the area where she has pain. She had less spasming today then last time manual therapy was performed. Therapy will continue to advance as tolerated.   Objective impairments include decreased activity tolerance, decreased endurance, decreased mobility, decreased ROM, decreased strength, increased fascial restrictions, increased muscle spasms, and pain. These impairments are limiting patient from cleaning, meal prep, laundry, and shopping. Personal factors including 3+ comorbidities: lupus, L1 compression fx LE arthritis, knee arthritis   are also affecting patient's functional outcome. Patient will benefit from skilled PT to address above impairments and improve overall function.   REHAB POTENTIAL: Good   CLINICAL DECISION MAKING: Evolving/moderate complexity declining ability to stand and perform ADL's    EVALUATION COMPLEXITY: Moderate     GOALS: Goals reviewed with patient? Yes   SHORT TERM GOALS:   STG Name Target Date Goal status  1 Patient will increase bilateral gross hip strength by 5 lbs  Baseline:  10/22/2021 2/3 Improved significantly last time tested Achieved   2 Patient will demonstrate full rotation bilateral without pain  Baseline:  10/22/2021 2/3 noticing improved rotation with LTR   3 Patient will increase lumbar flexion by 20 degrees  Baseline: 10/22/2021 Reports functional improvement but not measured today.   dLONG TERM  GOALS:    LTG Name Target Date Goal status  1 Patient will bend to pick items off the ground without pain  Baseline: 11/26/2021 INITIAL  2 Patient will ambulate 3000' without increased pain in order to perform ADL's  Baseline: 11/26/2021 INITIAL  3 Patient will stand for 2 hours without increased pain in order to perfrom ADL's  Baseline: 11/26/2021 INITIAL  PLAN: PT FREQUENCY: 2x/week   PT DURATION: 8 weeks   PLANNED INTERVENTIONS: Therapeutic exercises, Therapeutic activity, Neuro Muscular re-education, Gait training, Patient/Family education, Joint mobilization, Aquatic Therapy, Dry Needling, Electrical stimulation, Cryotherapy, Moist heat, Taping, and Manual therapy   PLAN FOR NEXT SESSION: Begin core and postural strengthening. Review self soft tissue mobilization; review stretches and add stretches as needed. Consider rows with band and extensions. Patient has never exercises much but she is active so progress the exercise program at a progressive level; Conider sitting and standing exercises as she has some difficulty getting in and out of the position. Continue with posterior chain strengthening in the pool             Carney Living PT DPT  11/28/2021, 1:13 PM

## 2021-12-01 ENCOUNTER — Encounter: Payer: Self-pay | Admitting: Gastroenterology

## 2021-12-04 ENCOUNTER — Encounter: Payer: Self-pay | Admitting: Gastroenterology

## 2021-12-04 ENCOUNTER — Ambulatory Visit (AMBULATORY_SURGERY_CENTER): Payer: Medicare Other | Admitting: Gastroenterology

## 2021-12-04 ENCOUNTER — Encounter: Payer: Medicare Other | Admitting: Gastroenterology

## 2021-12-04 VITALS — BP 114/66 | HR 76 | Temp 97.8°F | Resp 9 | Ht 65.0 in | Wt 144.0 lb

## 2021-12-04 DIAGNOSIS — D122 Benign neoplasm of ascending colon: Secondary | ICD-10-CM | POA: Diagnosis not present

## 2021-12-04 DIAGNOSIS — Z86010 Personal history of colonic polyps: Secondary | ICD-10-CM | POA: Diagnosis not present

## 2021-12-04 MED ORDER — SODIUM CHLORIDE 0.9 % IV SOLN: 500.0000 mL | Freq: Once | INTRAVENOUS | Status: DC

## 2021-12-04 NOTE — Progress Notes (Signed)
Pt's states no medical or surgical changes since previsit or office visit.  ° °VS DT °

## 2021-12-04 NOTE — Patient Instructions (Signed)
Information on polyps given to you today.  Await pathology results.  Resume previous diet and medications.  YOU HAD AN ENDOSCOPIC PROCEDURE TODAY AT THE [facility]:   Refer to the procedure report that was given to you for any specific questions about what was found during the examination.  If the procedure report does not answer your questions, please call your gastroenterologist to clarify.  If you requested that your care partner not be given the details of your procedure findings, then the procedure report has been included in a sealed envelope for you to review at your convenience later.  YOU SHOULD EXPECT: Some feelings of bloating in the abdomen. Passage of more gas than usual.  Walking can help get rid of the air that was put into your GI tract during the procedure and reduce the bloating. If you had a lower endoscopy (such as a colonoscopy or flexible sigmoidoscopy) you may notice spotting of blood in your stool or on the toilet paper. If you underwent a bowel prep for your procedure, you may not have a normal bowel movement for a few days.  Please Note:  You might notice some irritation and congestion in your nose or some drainage.  This is from the oxygen used during your procedure.  There is no need for concern and it should clear up in a day or so.  SYMPTOMS TO REPORT IMMEDIATELY:   Following lower endoscopy (colonoscopy or flexible sigmoidoscopy):  Excessive amounts of blood in the stool  Significant tenderness or worsening of abdominal pains  Swelling of the abdomen that is new, acute  Fever of 100F or higher   For urgent or emergent issues, a gastroenterologist can be reached at any hour by calling [phone]. Do not use MyChart messaging for urgent concerns.    DIET:  We do recommend a small meal at first, but then you may proceed to your regular diet.  Drink plenty of fluids but you should avoid alcoholic beverages for 24 hours.  ACTIVITY:  You should  plan to take it easy for the rest of today and you should NOT DRIVE or use heavy machinery until tomorrow (because of the sedation medicines used during the test).    FOLLOW UP: Our staff will call the number listed on your records 48-72 hours following your procedure to check on you and address any questions or concerns that you may have regarding the information given to you following your procedure. If we do not reach you, we will leave a message.  We will attempt to reach you two times.  During this call, we will ask if you have developed any symptoms of COVID 19. If you develop any symptoms (ie: fever, flu-like symptoms, shortness of breath, cough etc.) before then, please call [phone].  If you test positive for Covid 19 in the 2 weeks post procedure, please call and report this information to us.    If any biopsies were taken you will be contacted by phone or by letter within the next 1-3 weeks.  Please call us at [phone] if you have not heard about the biopsies in 3 weeks.    SIGNATURES/CONFIDENTIALITY: You and/or your care partner have signed paperwork which will be entered into your electronic medical record.  These signatures attest to the fact that that the information above on your After Visit Summary has been reviewed and is understood.  Full responsibility of the confidentiality of this discharge information lies with you and/or your care-partner. 

## 2021-12-04 NOTE — Op Note (Signed)
[place] Patient Name: Jadynn Epping Procedure Date: 12/04/2021 11:17 AM MRN: [id number] Endoscopist: Milus Banister , MD Age: 76 Referring MD:  Date of Birth: 05/20/1946 Gender: Female Account #: [id number] Procedure:                Colonoscopy Indications:              High risk colon cancer surveillance: Personal                            history of colonic polyps; large SSA growing into                            appendiceal orifice, s/p 2012 lap cecectomy.                            Colonoscopy 11/2016 no polyp or cancers Medicines:                Monitored Anesthesia Care Procedure:                Pre-Anesthesia Assessment:                           - Prior to the procedure, a History and Physical                            was performed, and patient medications and                            allergies were reviewed. The patient's tolerance of                            previous anesthesia was also reviewed. The risks                            and benefits of the procedure and the sedation                            options and risks were discussed with the patient.                            All questions were answered, and informed consent                            was obtained. Prior Anticoagulants: The patient has                            taken no previous anticoagulant or antiplatelet                            agents. ASA Grade Assessment: II - A patient with                            mild systemic disease. After reviewing the risks  and benefits, the patient was deemed in                            satisfactory condition to undergo the procedure.                           After obtaining informed consent, the colonoscope                            was passed under direct vision. Throughout the                            procedure, the patient's blood pressure, pulse, and                            oxygen saturations were  monitored continuously. The                            Olympus CF-HQ190L (Serial# [id number]) Colonoscope was                            introduced through the anus and advanced to the the                            cecum, identified by ileocecal valve, anatomy of                            post partial cecectomy. The colonoscopy was                            performed without difficulty. The patient tolerated                            the procedure well. The quality of the bowel                            preparation was good. Scope In: 11:23:24 AM Scope Out: 11:38:51 AM Scope Withdrawal Time: 0 hours 12 minutes 26 seconds  Total Procedure Duration: 0 hours 15 minutes 27 seconds  Findings:                 Post partial cecectomy anatomy noted.                           Three sessile polyps were found in the ascending                            colon. The polyps were 2 to 4 mm in size. These                            polyps were removed with a cold snare. Resection                            and retrieval were complete.  The exam was otherwise without abnormality on                            direct and retroflexion views. Complications:            No immediate complications. Estimated blood loss:                            None. Estimated Blood Loss:     Estimated blood loss: none. Impression:               - Post partial cecectomy anatomy noted.                           - Three 2 to 4 mm polyps in the ascending colon,                            removed with a cold snare. Resected and retrieved.                           - The examination was otherwise normal on direct                            and retroflexion views. Recommendation:           - Patient has a contact number available for                            emergencies. The signs and symptoms of potential                            delayed complications were discussed with the                             patient. Return to normal activities tomorrow.                            Written discharge instructions were provided to the                            patient.                           - Resume previous diet.                           - Continue present medications.                           - Await pathology results. Milus Banister, MD 12/04/2021 11:43:36 AM This report has been signed electronically.

## 2021-12-04 NOTE — Progress Notes (Signed)
Called to room to assist during endoscopic procedure.  Patient ID and intended procedure confirmed with present staff. Received instructions for my participation in the procedure from the performing physician.  

## 2021-12-04 NOTE — Progress Notes (Signed)
To pacu, VSS. Report to [email or web address].tb °

## 2021-12-04 NOTE — Progress Notes (Signed)
HPI: This is a woman with large SSA growing into appendiceal orifice, s/p 2012 lap cecectomy. Colonoscopy 11/2016 no polyp or cancers.   ROS: complete GI ROS as described in HPI, all other review negative.  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   Sjogren's syndrome ([place])    Systemic lupus ([place])     Past Surgical History:  Procedure Laterality Date   APPENDECTOMY     BUNIONECTOMY  2008   right foot   CATARACT EXTRACTION     COLONOSCOPY  12/05/2016   Dr.Muzammil Bruins   COLONOSCOPY W/ POLYPECTOMY  08/27/2011   ENDOMETRIAL ABLATION     FOOT SURGERY Right 12/17/2017   bone spur   ingrown toenail Right 12/16/2018   LAPAROSCOPIC APPENDECTOMY  10/11/2011   Procedure: APPENDECTOMY LAPAROSCOPIC;  Surgeon: Adin Hector, MD;  Location: [facility];  Service: General;  Laterality: N/A;  Partial Cecectomy and Appendectomy   POLYPECTOMY     WISDOM TOOTH EXTRACTION      Current Outpatient Medications  Medication Sig Dispense Refill   acetaminophen (TYLENOL) 650 MG CR tablet      aspirin 81 MG tablet Take 81 mg by mouth at bedtime.     calcium carbonate (OS-CAL) 600 MG tablet 1 tablet with meals     Cholecalciferol (VITAMIN D) 2000 units CAPS Take by mouth daily.     docusate sodium (COLACE) 100 MG capsule      escitalopram (LEXAPRO) 10 MG tablet TAKE 1 TABLET BY MOUTH  DAILY 90 tablet 0   fexofenadine (ALLEGRA) 180 MG tablet Take 180 mg by mouth daily.     fluticasone (FLONASE) 50 MCG/ACT nasal spray 2 sprays in each nostril     folic acid (FOLVITE) 1 MG tablet Take 0.8 mg by mouth daily.      hydroxychloroquine (PLAQUENIL) 200 MG tablet  Take 1 tablet (200 mg total) by mouth daily. 90 tablet 0   levothyroxine (SYNTHROID, LEVOTHROID) 88 MCG tablet Take 88 mcg by mouth every morning.     Multiple Vitamin (MULTIVITAMIN) tablet Take 1 tablet by mouth daily.     rosuvastatin (CRESTOR) 10 MG tablet Take by mouth.     senna (SENOKOT) 8.6 MG tablet      Thiamine HCl (VITAMIN B-1) 100 MG tablet Take 100 mg by mouth daily.     trimethoprim (TRIMPEX) 100 MG tablet Take 100 mg by mouth daily.     TURMERIC PO Take 1 capsule by mouth daily.      zolpidem (AMBIEN) 5 MG tablet Take 5 mg by mouth at bedtime as needed for sleep.     denosumab (PROLIA) 60 MG/ML SOSY injection Inject 60 mg into the skin every 6 (six) months.     diclofenac Sodium (VOLTAREN) 1 % GEL      triamcinolone cream (KENALOG) 0.1 %      Current Facility-Administered Medications  Medication Dose Route Frequency Provider Last Rate Last Admin   0.9 %  sodium chloride infusion  500 mL Intravenous Once Milus Banister, MD  Allergies as of 12/04/2021 - Review Complete 12/04/2021  Allergen Reaction Noted   Boniva [ibandronic acid] Other (See Comments) 05/12/2021   Latex Itching 10/22/2016   Sulfonamide derivatives     Bacitracin-polymyxin b Rash 10/27/2021    Family History  Problem Relation Age of Onset   Heart disease Mother    Psoriasis Father    Arthritis Father    Heart disease Brother    Heart attack Brother    Diabetes Maternal Grandfather    Cancer Maternal Uncle        colon   Colon cancer Maternal Uncle 80   Asthma Sister    Breast cancer Neg Hx     Social History   Socioeconomic History   Marital status: Married    Spouse name: Not on file   Number of children: Not on file   Years of education: Not on file   Highest education level: Not on file  Occupational History   Not on file  Tobacco Use   Smoking status: Never   Smokeless tobacco: Never  Vaping Use   Vaping Use: Never used  Substance and Sexual Activity   Alcohol use: No    Drug use: No   Sexual activity: Yes    Birth control/protection: Post-menopausal  Other Topics Concern   Not on file  Social History Narrative   Not on file   Social Determinants of Health   Financial Resource Strain: Not on file  Food Insecurity: Not on file  Transportation Needs: Not on file  Physical Activity: Not on file  Stress: Not on file  Social Connections: Not on file  Intimate Partner Violence: Not on file     Physical Exam: BP 127/67    Pulse 74    Temp 97.8 F (36.6 C) (Temporal)    Ht 5\' 5"  (1.651 m)    Wt 144 lb (65.3 kg)    SpO2 99%    BMI 23.96 kg/m  Constitutional: generally well-appearing Psychiatric: alert and oriented x3 Lungs: CTA bilaterally Heart: no MCR  Assessment and plan: 76 y.o. female with w/o adenomatous polyps  Surveillance colonoscopy today  Care is appropriate for the ambulatory setting.  Owens Loffler, MD [facility] 12/04/2021, 11:14 AM

## 2021-12-06 ENCOUNTER — Telehealth: Payer: Self-pay

## 2021-12-06 NOTE — Telephone Encounter (Signed)
°  Follow up Call-  Call back number 12/04/2021  Post procedure Call Back phone  # [phone]  Permission to leave phone message Yes  Some recent data might be hidden     Patient questions:  Do you have a fever, pain , or abdominal swelling? No. Pain Score  0 *  Have you tolerated food without any problems? Yes.    Have you been able to return to your normal activities? Yes.    Do you have any questions about your discharge instructions: Diet   No. Medications  No. Follow up visit  No.  Do you have questions or concerns about your Care? No.  Actions: * If pain score is 4 or above: No action needed, pain <4.   Have you developed a fever since your procedure? no  2.   Have you had an respiratory symptoms (SOB or cough) since your procedure? no  3.   Have you tested positive for COVID 19 since your procedure no  4.   Have you had any family members/close contacts diagnosed with the COVID 19 since your procedure?  no   If yes to any of these questions please route to Joylene John, RN and Joella Prince, RN

## 2021-12-07 ENCOUNTER — Encounter: Payer: Self-pay | Admitting: Gastroenterology

## 2021-12-11 ENCOUNTER — Other Ambulatory Visit: Payer: Self-pay | Admitting: Rheumatology

## 2021-12-11 DIAGNOSIS — M359 Systemic involvement of connective tissue, unspecified: Secondary | ICD-10-CM

## 2021-12-11 DIAGNOSIS — M3501 Sicca syndrome with keratoconjunctivitis: Secondary | ICD-10-CM

## 2021-12-11 DIAGNOSIS — Z79899 Other long term (current) drug therapy: Secondary | ICD-10-CM

## 2021-12-11 NOTE — Telephone Encounter (Signed)
Next Visit: 03/13/2022   Last Visit: 10/10/2021   Last Fill: 10/10/2021   DX: Autoimmune disease   Current Dose per office note on 10/10/2021: Plaquenil 200 mg 1 tablet by mouth daily  PLQ eye exam: 10/02/2021 Normal.   Labs: 10/10/2021 CBC and CMP WNL  Okay to refill PLQ?

## 2021-12-12 ENCOUNTER — Other Ambulatory Visit: Payer: Self-pay

## 2021-12-12 ENCOUNTER — Encounter (HOSPITAL_BASED_OUTPATIENT_CLINIC_OR_DEPARTMENT_OTHER): Payer: Self-pay | Admitting: Physical Therapy

## 2021-12-12 ENCOUNTER — Ambulatory Visit (HOSPITAL_BASED_OUTPATIENT_CLINIC_OR_DEPARTMENT_OTHER): Payer: Medicare Other | Admitting: Physical Therapy

## 2021-12-12 DIAGNOSIS — G8929 Other chronic pain: Secondary | ICD-10-CM

## 2021-12-12 DIAGNOSIS — R252 Cramp and spasm: Secondary | ICD-10-CM

## 2021-12-12 DIAGNOSIS — M545 Low back pain, unspecified: Secondary | ICD-10-CM

## 2021-12-12 NOTE — Therapy (Signed)
OUTPATIENT PHYSICAL THERAPY TREATMENT NOTE   Patient Name: [name] MRN: [id number] DOB:February 07, 1946, 76 y.o., female, female Today's Date: 10/27/2021  PCP: Cari Caraway, MD REFERRING PROVIDER: Cari Caraway, MD   PT End of Session - 12/12/21 1023     Visit Number 12    Number of Visits 19    Date for PT Re-Evaluation 12/15/21    Authorization Type [employer] Medicare    PT Start Time 6168    Activity Tolerance Patient tolerated treatment well    Behavior During Therapy [facility] for tasks assessed/performed                 Past Medical History:  Diagnosis Date   Allergy    Colon polyp    Compression fracture of L1 lumbar vertebra ([place])    dx by ortho   Environmental allergies    Hypothyroid    Osteoarthritis    Osteoarthritis    Osteoarthritis    Osteopenia    Plantar fasciitis, right    Raynauds phenomenon    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   Sjogren's syndrome ([place])    Systemic lupus ([place])    Past Surgical History:  Procedure Laterality Date   APPENDECTOMY     BUNIONECTOMY  2008   right foot   CATARACT EXTRACTION     COLONOSCOPY  12/05/2016   Dr.Jacobs   COLONOSCOPY W/ POLYPECTOMY  08/27/2011   ENDOMETRIAL ABLATION     FOOT SURGERY Right 12/17/2017   bone spur   ingrown toenail Right 12/16/2018   LAPAROSCOPIC APPENDECTOMY  10/11/2011   Procedure: APPENDECTOMY LAPAROSCOPIC;  Surgeon: Adin Hector, MD;  Location: [facility];  Service: General;  Laterality: N/A;  Partial Cecectomy and Appendectomy   POLYPECTOMY     WISDOM TOOTH EXTRACTION     Patient Active Problem List   Diagnosis Date Noted   Encounter for counseling 09/09/2018   Osteopenia of multiple sites 08/22/2017   History of vitamin D deficiency 08/22/2017   Autoimmune disease ([place]) 10/23/2016   Sjogren's syndrome ([place]) 10/09/2016   Primary osteoarthritis of both hands 10/09/2016   Primary osteoarthritis of both knees 10/09/2016   High risk medication use 09/12/2016   SBO (small  bowel obstruction), partial postop 10/16/2011   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 She has been working on her exercises. She is not longer having pain in the car. She is planning on joining the gym.   PERTINENT HISTORY:  Osteopenia, L1 compression fx, OA of the knees and lumbar spine, PF and bone sur in the right foot with bone spur removal, raynaud, lupus Sjogrens    PAIN:  Are you having pain? no 2/21 VAS scale: 0/10 no pain right now  Pain location: right and left knee   Pain orientation: Right and Left  PAIN TYPE: aching Pain description: intermittent  Aggravating factors: as the day progresses  Relieving factors: rest    PRECAUTIONS: Other: osteopenia    WEIGHT BEARING RESTRICTIONS No   FALLS:  Has patient fallen in last 6 months? 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    PLOF: Independent   PATIENT GOALS    To be able to resume her normal activity all day without pain      OBJECTIVE:     TODAY'S TREATMENT  2/21     Pt seen for aquatic therapy today.  Treatment took place in water 3.25-4 ft in depth at the [facility] pool. Temp of water was 91.  Pt entered/exited the  pool via stairs (step through pattern) independently with bilat rail.    Warm up: heel/toe walking x4 laps across pool chest deep side stepping x4 laps from shallow to deep; 2 laps side step ith stretch   - standing arms crossed rotation in chin deep water x20  - Standing with arms out rotation x20  - Standing arms close side bending x20  Standing arms wide side bending x20   - body board blade x20   - seated board flexion for stretch 3x20 sec hold  - seated board lateral rotation 1x each side   - Standing hip extension x10 each leg  Standing hip abduction x10  - Standing march 2x10  Patient reported some fatigue in her back after standing exercises.         Pt requires buoyancy for support and to offload joints with strengthening exercises. Viscosity of the water is needed for resistance of strengthening; water current perturbations provides challenge to standing balance unsupported, requiring increased core activation.  2/7 Manual: STM to bilateral paraspinals: grade I and II PA glides to mid and upper thoracic spine   LTR x10 each side;   Piriformis stretch 3x20 sec hold   Sink stretch 3x10 sec hold   Bilateral er 3x10 green   Standing:  Shoulder extension 3x10 green  Scap retraction 3x10 green  Bilateral er 3x10 green        2/3 Pt seen for aquatic therapy today.  Treatment took place in water 3.25-4 ft in depth at the [facility] pool. Temp of water was 91.  Pt entered/exited the pool via stairs (step through pattern) independently with bilat rail.    Warm up: heel/toe walking x4 laps across pool chest deep side stepping x4 laps from shallow to deep; 2 laps side step ith stretch   -standing hip 3 way with cuing for technique - Balance exercises     - single leg stance 3x 15 sec hold each leg ( more diffculty on the left)     -heel  raise to toe up for balance x10  - rainbow weights:     - triceps x15 - extension x15  - winging x15  -  squats with cuing for technique x20  - Seated     - LAQ x20     - march x20  - blue noodle x10         Pt requires buoyancy for support and to offload joints with strengthening exercises. Viscosity of the water is needed for resistance of strengthening; water current perturbations provides challenge to standing balance unsupported, requiring increased core activation.       PATIENT EDUCATION:  Education details:UE ore strengthening exercise technique   benefits of core and postural exercises  Person educated: Patient Education method: Explanation, Demonstration, Tactile cues, Verbal cues, and Handouts Education comprehension: verbalized understanding, returned demonstration, verbal cues required, tactile cues required, and needs further education     HOME EXERCISE PROGRAM: Access Code: [email or web address] URL: [email or web address] Date: 10/01/2021 Prepared by: Carolyne Littles   Exercises Supine Lower Trunk Rotation - 1 x daily - 7 x weekly - 3 sets - 10 reps Shoulder External Rotation and Scapular Retraction with Resistance - 1 x daily - 7 x weekly - 3 sets - 10 reps Seated Hip Abduction with Resistance - 1 x daily - 7 x weekly - 3 sets - 10 reps Theracane Over Shoulder - 1 x daily - 7 x weekly - 3 sets - 10 reps     ASSESSMENT:   CLINICAL IMPRESSION: The patient plans on joining the gym.She has her last water visit scheduled for Thursday. At this point she is nearing her max potential with the pool. We will re-assess next visit on land and begin going through gym machines and exercises with the patient. We also reviewed posterior chain strengthening in the water.    Objective impairments include decreased activity tolerance, decreased endurance, decreased mobility, decreased ROM, decreased strength, increased fascial restrictions, increased muscle spasms, and pain. These impairments are limiting patient from cleaning, meal prep, laundry, and shopping. Personal factors  including 3+ comorbidities: lupus, L1 compression fx LE arthritis, knee arthritis   are also affecting patient's functional outcome. Patient will benefit from skilled PT to address above impairments and improve overall function.   REHAB POTENTIAL: Good   CLINICAL DECISION MAKING: Evolving/moderate complexity declining ability to stand and perform ADL's    EVALUATION COMPLEXITY: Moderate     GOALS: Goals reviewed with patient? Yes   SHORT TERM GOALS:   STG Name Target Date Goal status  1 Patient will increase bilateral gross hip strength by 5 lbs  Baseline:  10/22/2021 2/3 Improved significantly last time tested Achieved   2 Patient will demonstrate full rotation bilateral without pain  Baseline:  10/22/2021 2/3 noticing improved rotation with LTR   3 Patient will increase lumbar flexion by 20 degrees  Baseline: 10/22/2021 Reports functional improvement but not measured today.   dLONG TERM GOALS:    LTG Name Target Date Goal status  1 Patient will bend to pick items off the ground without pain  Baseline: 11/26/2021 INITIAL  2 Patient will ambulate 3000' without increased pain in order to perform ADL's  Baseline: 11/26/2021 INITIAL  3 Patient will stand for 2 hours without increased pain in order to perfrom ADL's  Baseline: 11/26/2021 INITIAL  PLAN: PT FREQUENCY: 2x/week   PT DURATION: 8 weeks   PLANNED INTERVENTIONS: Therapeutic exercises, Therapeutic activity, Neuro Muscular re-education, Gait training, Patient/Family education, Joint mobilization, Aquatic Therapy, Dry Needling, Electrical  stimulation, Cryotherapy, Moist heat, Taping, and Manual therapy   PLAN FOR NEXT SESSION: Begin core and postural strengthening. Review self soft tissue mobilization; review stretches and add stretches as needed. Consider rows with band and extensions. Patient has never exercises much but she is active so progress the exercise program at a progressive level; Conider sitting and standing exercises as she  has some difficulty getting in and out of the position. Continue with posterior chain strengthening in the pool             Carney Living PT DPT  12/12/2021, 10:30 AM

## 2021-12-14 ENCOUNTER — Ambulatory Visit (HOSPITAL_BASED_OUTPATIENT_CLINIC_OR_DEPARTMENT_OTHER): Payer: Medicare Other | Admitting: Physical Therapy

## 2021-12-14 ENCOUNTER — Encounter (HOSPITAL_BASED_OUTPATIENT_CLINIC_OR_DEPARTMENT_OTHER): Payer: Self-pay | Admitting: Physical Therapy

## 2021-12-14 ENCOUNTER — Other Ambulatory Visit: Payer: Self-pay

## 2021-12-14 DIAGNOSIS — G8929 Other chronic pain: Secondary | ICD-10-CM

## 2021-12-14 DIAGNOSIS — R252 Cramp and spasm: Secondary | ICD-10-CM

## 2021-12-14 DIAGNOSIS — M545 Low back pain, unspecified: Secondary | ICD-10-CM | POA: Diagnosis not present

## 2021-12-14 NOTE — Therapy (Signed)
OUTPATIENT PHYSICAL THERAPY TREATMENT NOTE   Patient Name: [name] MRN: [id number] DOB:02-26-1946, 76 y.o., female Today's Date: 10/27/2021  PCP: Cari Caraway, MD REFERRING PROVIDER: Cari Caraway, MD   PT End of Session - 12/14/21 1120     Visit Number 13    Number of Visits 19    Date for PT Re-Evaluation 12/15/21    Authorization Type U[employer] Medicare    PT Start Time 1120    PT Stop Time 1200    PT Time Calculation (min) 40 min    Activity Tolerance Patient tolerated treatment well    Behavior During Therapy WFL for tasks assessed/performed                 Past Medical History:  Diagnosis Date   Allergy    Colon polyp    Compression fracture of L1 lumbar vertebra ([place])    dx by ortho   Environmental allergies    Hypothyroid    Osteoarthritis    Osteoarthritis    Osteoarthritis    Osteopenia    Plantar fasciitis, right    Raynauds phenomenon    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   Sjogren's syndrome ([place])    Systemic lupus ([place])    Past Surgical History:  Procedure Laterality Date   APPENDECTOMY     BUNIONECTOMY  2008   right foot   CATARACT EXTRACTION     COLONOSCOPY  12/05/2016   Dr.Jacobs   COLONOSCOPY W/ POLYPECTOMY  08/27/2011   ENDOMETRIAL ABLATION     FOOT SURGERY Right 12/17/2017   bone spur   ingrown toenail Right 12/16/2018   LAPAROSCOPIC APPENDECTOMY  10/11/2011   Procedure: APPENDECTOMY LAPAROSCOPIC;  Surgeon: Adin Hector, MD;  Location: [facility];  Service: General;  Laterality: N/A;  Partial Cecectomy and Appendectomy   POLYPECTOMY     WISDOM TOOTH EXTRACTION     Patient Active Problem List   Diagnosis Date Noted   Encounter for counseling 09/09/2018   Osteopenia of multiple sites 08/22/2017   History of vitamin D deficiency 08/22/2017   Autoimmune disease ([place]) 10/23/2016   Sjogren's syndrome ([place]) 10/09/2016   Primary osteoarthritis of both hands 10/09/2016   Primary osteoarthritis of both knees  10/09/2016   High risk medication use 09/12/2016   SBO (small bowel obstruction), partial postop 10/16/2011   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   BILIARY DYSKINESIA, 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 no 2/21 VAS scale: 1/10 no pain right now  Pain location: right and left knee   Pain orientation: Right and Left  PAIN TYPE: aching Pain description: intermittent  Aggravating factors: as the day progresses  Relieving factors: rest    PRECAUTIONS: Other: osteopenia    WEIGHT BEARING RESTRICTIONS No   FALLS:  Has patient fallen in last 6 months? 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    PLOF: Independent   PATIENT GOALS    To be able to resume her normal activity all day without pain      OBJECTIVE:     TODAY'S TREATMENT  2/23  Pt seen for aquatic therapy today.  Treatment took place in water 3.25-4 ft in depth at the [facility] pool. Temp of water was 91.  Pt entered/exited the pool via stairs (step through pattern) independently with bilat rail.     Warm up:Warm up walking forward, back and sidestepping x 4 widths each. Instruction on TrA bracing with exercises today. Side lunges with 1 foam hand buoys shoulder add/abd  Seated Flutter kicking 3x20 Add/abd 3x20   STS from 3 step VC and demonstration for proper technique x10  Core strengthening Kick board push downs 3x 15 last with manual perturbation Resisted lateral rotations x 10 R/L using kick board  Balance Forward and backward tandem walking supported by 1 foam hand buoy Small BOS with ue add/abd 1 foam hand buoys Tandem L/R leading x10 with ue add/abd   Pt requires buoyancy for support and to offload joints with strengthening exercises. Viscosity of the water is needed for resistance of strengthening; water current perturbations provides challenge to standing balance unsupported, requiring increased core activation.   2/21 Pt seen for aquatic therapy today.  Treatment took place in water 3.25-4 ft in depth at the [facility] pool. Temp of water was 91.  Pt entered/exited the pool via stairs (step through pattern) independently with bilat rail.    Warm up walking forward, back and sidestepping x 4 widths each.  - standing arms crossed rotation in chin deep water x20  - Standing with arms out rotation x20  - Standing arms close side bending x20  Standing arms wide side bending x20   - body board blade x20   - seated board flexion for stretch 3x20 sec hold  - seated board lateral rotation 1x each side   - Standing hip extension x10 each leg  Standing hip abduction x10  - Standing march 2x10  Patient reported some fatigue in her back after standing exercises.   Pt requires buoyancy for support and to offload joints with strengthening exercises. Viscosity of the water is needed for resistance of strengthening; water current perturbations provides challenge to standing balance unsupported, requiring increased core activation.  2/7 Manual: STM to  bilateral paraspinals: grade I and II PA glides to mid and upper thoracic spine   LTR x10 each side;   Piriformis stretch 3x20 sec hold   Sink stretch 3x10 sec hold   Bilateral er 3x10  green   Standing:  Shoulder extension 3x10 green  Scap retraction 3x10 green  Bilateral er 3x10 green        PATIENT EDUCATION:  Education details:UE ore strengthening exercise technique   benefits of core and postural exercises  Person educated: Patient Education method: Consulting civil engineer, Demonstration, Corporate treasurer cues, Verbal cues, and Handouts Education comprehension: verbalized understanding, returned demonstration, verbal cues required, tactile cues required, and needs further education     HOME EXERCISE PROGRAM: Access Code: [email or web address] URL: [email or web address] Date: 10/01/2021 Prepared by: Carolyne Littles   Exercises Supine Lower Trunk Rotation - 1 x daily - 7 x weekly - 3 sets - 10 reps Shoulder External Rotation and Scapular Retraction with Resistance - 1 x daily - 7 x weekly - 3 sets - 10 reps Seated Hip Abduction with Resistance - 1 x daily - 7 x weekly - 3 sets - 10 reps Theracane Over Shoulder - 1 x daily - 7 x weekly - 3 sets - 10 reps     ASSESSMENT:   CLINICAL IMPRESSION: Pt has joined [employer].  Plans on having appointment with fitness instructor soon. Advanced balance and core strengthening. Added resisted core rotations which she tolerates fair. Instruction on abdominal bracing which she maintains throughout session with vc. She will continue to benefit from skilled PT on land for continued core strengthening, balance retraining and instruction on final HEP.  She does report minor increase in LB discomfort upon completion today probably secondary to increase in core strengthening. She is encouraged to use OTC meds as needed with rest.     Objective impairments include decreased activity tolerance, decreased endurance, decreased mobility, decreased ROM, decreased  strength, increased fascial restrictions, increased muscle spasms, and pain. These impairments are limiting patient from cleaning, meal prep, laundry, and shopping. Personal factors including 3+ comorbidities: lupus, L1 compression fx LE arthritis, knee arthritis   are also affecting patient's functional outcome. Patient will benefit from skilled PT to address above impairments and improve overall function.   REHAB POTENTIAL: Good   CLINICAL DECISION MAKING: Evolving/moderate complexity declining ability to stand and perform ADL's    EVALUATION COMPLEXITY: Moderate     GOALS: Goals reviewed with patient? Yes   SHORT TERM GOALS:   STG Name Target Date Goal status  1 Patient will increase bilateral gross hip strength by 5 lbs  Baseline:  10/22/2021 2/3 Improved significantly last time tested Achieved   2 Patient will demonstrate full rotation bilateral without pain  Baseline:  10/22/2021 2/3 noticing improved rotation with LTR   3 Patient will increase lumbar flexion by 20 degrees  Baseline: 10/22/2021 Reports functional improvement but not measured today.   dLONG TERM GOALS:    LTG Name Target Date Goal status  1 Patient will bend to pick items off the ground without pain  Baseline: 11/26/2021 INITIAL  2 Patient will ambulate 3000' without increased pain in order to perform ADL's  Baseline: 11/26/2021 INITIAL  3 Patient will stand for 2 hours without increased pain in order to perfrom ADL's  Baseline: 11/26/2021 INITIAL  PLAN: PT FREQUENCY: 2x/week   PT DURATION: 8 weeks   PLANNED INTERVENTIONS: Therapeutic exercises, Therapeutic activity, Neuro Muscular re-education, Gait training, Patient/Family education, Joint mobilization, Aquatic Therapy, Dry Needling, Electrical stimulation, Cryotherapy, Moist heat, Taping, and Manual therapy   PLAN FOR NEXT SESSION: Begin core and postural strengthening. Review self soft tissue mobilization; review stretches and add stretches as needed. Consider  rows with band and extensions. Patient has never exercises much but  she is active so progress the exercise program at a progressive level; Conider sitting and standing exercises as she has some difficulty getting in and out of the position. Continue with posterior chain strengthening in the pool             Vedia Pereyra MPT 12/14/2021, 1:31 PM

## 2021-12-26 ENCOUNTER — Ambulatory Visit (HOSPITAL_BASED_OUTPATIENT_CLINIC_OR_DEPARTMENT_OTHER): Payer: Medicare Other | Attending: Family Medicine | Admitting: Physical Therapy

## 2021-12-26 ENCOUNTER — Encounter (HOSPITAL_BASED_OUTPATIENT_CLINIC_OR_DEPARTMENT_OTHER): Payer: Self-pay | Admitting: Physical Therapy

## 2021-12-26 ENCOUNTER — Other Ambulatory Visit: Payer: Self-pay

## 2021-12-26 DIAGNOSIS — R252 Cramp and spasm: Secondary | ICD-10-CM | POA: Insufficient documentation

## 2021-12-26 DIAGNOSIS — M545 Low back pain, unspecified: Secondary | ICD-10-CM | POA: Diagnosis not present

## 2021-12-26 DIAGNOSIS — G8929 Other chronic pain: Secondary | ICD-10-CM | POA: Insufficient documentation

## 2021-12-26 NOTE — Therapy (Signed)
OUTPATIENT PHYSICAL THERAPY TREATMENT NOTE/ Progress Note    Patient Name: [name] MRN: [id number] DOB:Oct 18, 1946, 76 y.o., female Today's Date: 10/27/2021  PCP: Cari Caraway, MD REFERRING PROVIDER: Cari Caraway, MD  Progress Note Reporting Period 1/12 to 3/7  See note below for Objective Data and Assessment of Progress/Goals.       PT End of Session - 12/26/21 1304     Visit Number 14    Number of Visits 26    Date for PT Re-Evaluation 02/06/22    Authorization Type UHC Medicare progress note donw on 14    PT Start Time 1300    PT Stop Time 1343    PT Time Calculation (min) 43 min    Activity Tolerance Patient tolerated treatment well    Behavior During Therapy WFL for tasks assessed/performed                 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   Sjogren's syndrome ([place])    Systemic lupus ([place])    Past Surgical History:  Procedure Laterality Date   APPENDECTOMY     BUNIONECTOMY  2008   right foot   CATARACT EXTRACTION     COLONOSCOPY  12/05/2016   Dr.Jacobs   COLONOSCOPY W/ POLYPECTOMY  08/27/2011   ENDOMETRIAL ABLATION     FOOT SURGERY Right 12/17/2017   bone spur   ingrown toenail Right 12/16/2018   LAPAROSCOPIC APPENDECTOMY  10/11/2011   Procedure: APPENDECTOMY LAPAROSCOPIC;  Surgeon: Adin Hector, MD;  Location: [facility];  Service: General;  Laterality: N/A;  Partial Cecectomy and Appendectomy   POLYPECTOMY     WISDOM TOOTH EXTRACTION     Patient Active Problem List   Diagnosis Date Noted   Encounter for counseling 09/09/2018   Osteopenia of multiple sites 08/22/2017   History of vitamin D deficiency 08/22/2017    Autoimmune disease ([place]) 10/23/2016   Sjogren's syndrome ([place]) 10/09/2016   Primary osteoarthritis of both hands 10/09/2016   Primary osteoarthritis of both knees 10/09/2016   High risk medication use 09/12/2016   SBO (small bowel obstruction), partial postop 10/16/2011   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 This morning she started having some pain. She isn;t having any pain now.   PERTINENT HISTORY:  Osteopenia, L1 compression fx, OA of the knees and lumbar spine, PF and bone sur in the right foot with bone spur removal, raynaud, lupus Sjogrens    PAIN:  Are you having pain? no 2/21 VAS scale: 1/10 no pain right now  Pain location: right and left knee   Pain orientation: Right and Left  PAIN TYPE: aching Pain description: intermittent  Aggravating factors: as the day progresses  Relieving factors: rest    PRECAUTIONS: Other: osteopenia    WEIGHT BEARING RESTRICTIONS No   FALLS:  Has patient fallen in last 6 months? 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    PLOF: Independent   PATIENT GOALS    To be able to resume her normal activity all day without pain      OBJECTIVE:  LUMBARAROM/PROM    A/PROM A/PROM  10/01/2021  Flexion 80 no increase in pain   Extension    Right lateral flexion    Left lateral flexion    Right rotation 50% limited   Left rotation 50% limited    (Blank rows = not tested)      MMT Right 10/01/2021 Left 10/01/2021  Hip flexion 13.4  1/12 20.5  22.7 10.9  1/12 22  25.7  Hip extension      Hip abduction 18.9   26.7  27.7 13.8   22.8  30.5  Hip adduction      Hip internal rotation      Hip external rotation      Knee flexion      Knee extension 31.3  24.1 31.2  35.0  Ankle dorsiflexion      Ankle plantarflexion      Ankle inversion      Ankle eversion       (Blank rows = not tested)    TODAY'S TREATMENT  3/7 Manual: STM to bilateral paraspinals: grade I and II PA glides to mid and upper thoracic spine   Bridge 2x10  SLR x15 each leg  Quad set X10 each leg   Reviewed FOTO and improvements  Reviewed strength measures and improvements  Cable row 10lb 2x10  Cable extension 2x10 10 lbs        2/23  Pt seen for aquatic therapy today.  Treatment took place in water 3.25-4 ft in depth at the [facility] pool. Temp of water was 91.  Pt entered/exited the pool via stairs (step through pattern) independently with bilat rail.    Warm up:Warm up walking forward, back and sidestepping x 4 widths each. Instruction on TrA bracing with exercises today. Side lunges with 1 foam hand buoys shoulder add/abd  Seated Flutter kicking 3x20 Add/abd 3x20   STS from 3 step VC and demonstration for proper technique x10  Core strengthening Kick board push downs 3x 15 last with manual perturbation Resisted lateral rotations x 10 R/L using kick board  Balance Forward and backward tandem walking supported by 1 foam hand buoy Small BOS with ue add/abd 1 foam hand buoys Tandem L/R leading x10 with ue add/abd   Pt requires buoyancy for support and to offload joints with strengthening exercises. Viscosity of the  water is needed for resistance of strengthening; water current perturbations provides challenge to standing balance unsupported, requiring increased core activation.        PATIENT EDUCATION:  Education details:UE ore strengthening exercise technique   benefits of core and postural exercises  Person educated: Patient Education method: Explanation, Demonstration, Tactile cues, Verbal cues, and Handouts Education comprehension: verbalized understanding, returned demonstration, verbal cues required, tactile cues required, and needs further education     HOME EXERCISE PROGRAM: Access Code: [email or web address] URL: [email or web address] Date: 10/01/2021 Prepared by: Carolyne Littles   Exercises Supine Lower Trunk Rotation - 1 x daily - 7 x weekly - 3 sets - 10 reps Shoulder External Rotation and Scapular Retraction with Resistance - 1 x daily - 7 x weekly - 3 sets - 10 reps Seated Hip Abduction with Resistance - 1 x daily - 7 x weekly - 3 sets - 10 reps Theracane Over Shoulder - 1 x daily - 7 x weekly - 3 sets - 10 reps     ASSESSMENT:   CLINICAL IMPRESSION: Th patient continues to have pain but she is progressing well. She is starting to have increased knee pain. She was shown knee specific exercises to work on. She found them challenging but she was able to do them. We assessed her strength and FOTO measure. Both have improved significantly. We started with a base exercise program in the gym. We will expand that program on her next visit.     Objective impairments include decreased activity tolerance, decreased endurance, decreased mobility, decreased ROM, decreased strength, increased fascial restrictions, increased muscle spasms, and pain. These impairments are limiting patient from cleaning, meal prep, laundry, and shopping. Personal factors including 3+ comorbidities: lupus, L1 compression fx LE arthritis, knee arthritis   are also affecting patient's functional outcome. Patient  will benefit from skilled PT to address above impairments and improve overall function.   REHAB POTENTIAL: Good   CLINICAL DECISION MAKING: Evolving/moderate complexity declining ability to stand and perform ADL's    EVALUATION COMPLEXITY: Moderate     GOALS: Goals reviewed with patient? Yes   SHORT TERM GOALS:   STG Name Target Date Goal status  1 Patient will increase bilateral gross hip strength by 5 lbs  Baseline:  10/22/2021 3/7 Improved significantly last time tested Achieved   2 Patient will demonstrate full rotation bilateral without pain  Baseline:  10/22/2021 3/7 noticing improved rotation with LTR  Achieved   3 Patient will increase lumbar flexion by 20 degrees  Baseline: 10/22/2021 Reports functional improvement but not measured today.   dLONG TERM GOALS:    LTG Name Target Date Goal status  1 Patient will bend to pick items off the ground without pain  Baseline: 11/26/2021 INITIAL  2 Patient will ambulate 3000' without increased pain in order to perform ADL's  Baseline: 11/26/2021 INITIAL  3 Patient will stand for 2 hours without increased pain in order to perfrom ADL's  Baseline: 11/26/2021 INITIAL  PLAN: PT FREQUENCY: 2x/week   PT DURATION: 8 weeks   PLANNED INTERVENTIONS: Therapeutic exercises, Therapeutic activity, Neuro Muscular re-education, Gait training, Patient/Family education, Joint mobilization, Aquatic Therapy, Dry Needling, Electrical stimulation, Cryotherapy, Moist heat, Taping, and Manual therapy   PLAN FOR NEXT SESSION: Begin core and postural strengthening. Review self soft tissue mobilization; review stretches and add stretches as needed. Consider rows with band and extensions. Patient has never exercises much but she is active so progress the exercise program at a progressive level; Conider sitting and standing exercises  as she has some difficulty getting in and out of the position. Continue with posterior chain strengthening in the pool              Carney Living DPT  12/26/2021, 1:55 PM

## 2021-12-28 ENCOUNTER — Encounter (HOSPITAL_BASED_OUTPATIENT_CLINIC_OR_DEPARTMENT_OTHER): Payer: Self-pay | Admitting: Physical Therapy

## 2021-12-28 ENCOUNTER — Ambulatory Visit (HOSPITAL_BASED_OUTPATIENT_CLINIC_OR_DEPARTMENT_OTHER): Payer: Medicare Other | Admitting: Physical Therapy

## 2021-12-28 ENCOUNTER — Other Ambulatory Visit: Payer: Self-pay

## 2021-12-28 DIAGNOSIS — M545 Low back pain, unspecified: Secondary | ICD-10-CM

## 2021-12-28 DIAGNOSIS — R252 Cramp and spasm: Secondary | ICD-10-CM

## 2021-12-28 NOTE — Therapy (Signed)
OUTPATIENT PHYSICAL THERAPY TREATMENT NOTE/ Progress Note    Patient Name: KANYLA OMEARA MRN: [id number] DOB:[birth year], 76 y.o., female Today's Date: 10/27/2021  PCP: Cari Caraway, MD REFERRING PROVIDER: Cari Caraway, MD       PT End of Session - 12/28/21 1241     Visit Number 15    Number of Visits 26    Date for PT Re-Evaluation 02/06/22    Authorization Type [employer] Medicare progress note donw on 14    PT Start Time 1233    PT Stop Time 1313    PT Time Calculation (min) 40 min    Activity Tolerance Patient tolerated treatment well    Behavior During Therapy WFL for tasks assessed/performed                 Past Medical History:  Diagnosis Date   Allergy    Colon polyp    Compression fracture of L1 lumbar vertebra ([place])    dx by ortho   Environmental allergies    Hypothyroid    Osteoarthritis    Osteoarthritis    Osteoarthritis    Osteopenia    Plantar fasciitis, right    Raynauds phenomenon    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)    Past Surgical History:  Procedure Laterality Date   APPENDECTOMY     BUNIONECTOMY  2008   right foot   CATARACT EXTRACTION     COLONOSCOPY  12/05/2016   Dr.Jacobs   COLONOSCOPY W/ POLYPECTOMY  08/27/2011   ENDOMETRIAL ABLATION     FOOT SURGERY Right 12/17/2017   bone spur   ingrown toenail Right 12/16/2018   LAPAROSCOPIC APPENDECTOMY  10/11/2011   Procedure: APPENDECTOMY LAPAROSCOPIC;  Surgeon: Adin Hector, MD;  Location: [facility];  Service: General;  Laterality: N/A;  Partial Cecectomy and Appendectomy   POLYPECTOMY     WISDOM TOOTH EXTRACTION     Patient Active Problem List   Diagnosis Date Noted   Encounter for counseling 09/09/2018   Osteopenia of multiple sites 08/22/2017   History of vitamin D deficiency 08/22/2017   Autoimmune disease ([place]) 10/23/2016   Sjogren's syndrome ([place]) 10/09/2016   Primary osteoarthritis of both hands  10/09/2016   Primary osteoarthritis of both knees 10/09/2016   High risk medication use 09/12/2016   SBO (small bowel obstruction), partial postop 10/16/2011   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 This morning she started having some pain but she used her stretches and it improved. She isn;t having any pain now.   PERTINENT HISTORY:  Osteopenia, L1 compression fx, OA of the knees and lumbar spine, PF and bone sur in the right foot with bone spur removal, raynaud, lupus Sjogrens    PAIN:  Are you having pain? no 2/21 VAS scale: 1/10 no pain right now  Pain location: right and left knee   Pain orientation: Right and Left  PAIN TYPE: aching Pain description: intermittent  Aggravating factors: as the day progresses  Relieving factors: rest    PRECAUTIONS: Other: osteopenia    WEIGHT BEARING RESTRICTIONS No   FALLS:  Has patient fallen in last 6 months? 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    PLOF: Independent   PATIENT GOALS    To be able to resume her normal activity all day without pain      OBJECTIV   TODAY'S TREATMENT  3/9   Seated elliptical 5 min reviewed set up and use   Leg press:  reviewed both kinds 3x10 on cybex 55 lbs  Reviewed life styles   Life fitness hip abducton 2x15 25 lbs   Life fitness row machine 3x10 25 lbs   Reviewed sets and progression of activity  Reviewed machines to avoid   3/7 Manual: STM to bilateral paraspinals: grade I and II PA glides to mid and upper thoracic spine   Bridge 2x10  SLR x15 each leg  Quad set X10 each leg   Reviewed FOTO and improvements  Reviewed strength measures and improvements  Cable row 10lb 2x10  Cable extension 2x10 10 lbs        2/23  Pt seen for aquatic therapy today.  Treatment took place in water 3.25-4 ft in depth at the [facility] pool. Temp of water was 91.  Pt entered/exited the pool via stairs (step through pattern) independently with bilat rail.    Warm up:Warm up walking forward, back and sidestepping x 4 widths each. Instruction on TrA bracing with exercises today. Side lunges with 1 foam hand buoys shoulder add/abd  Seated Flutter kicking 3x20 Add/abd 3x20   STS from 3 step VC and demonstration for proper technique x10  Core strengthening Kick board push downs 3x 15 last with manual perturbation Resisted lateral rotations x 10 R/L using kick board  Balance Forward and backward tandem walking supported by 1 foam hand buoy Small BOS with ue add/abd 1 foam hand buoys Tandem L/R leading x10 with ue add/abd   Pt requires buoyancy for support and to offload joints with strengthening exercises. Viscosity of the water is needed for resistance of strengthening; water current perturbations provides challenge to standing balance unsupported, requiring increased core activation.        PATIENT EDUCATION:  Education details:UE ore strengthening exercise technique   benefits of core and postural exercises  Person educated: Patient Education method: Explanation, Demonstration, Tactile cues, Verbal cues, and Handouts Education comprehension: verbalized understanding,  returned demonstration, verbal cues required, tactile cues required, and needs further education     HOME EXERCISE PROGRAM: Access Code: [email or web address] URL: [email or web address] Date: 10/01/2021 Prepared by: Carolyne Littles   Exercises Supine Lower Trunk Rotation - 1 x daily -  7 x weekly - 3 sets - 10 reps Shoulder External Rotation and Scapular Retraction with Resistance - 1 x daily - 7 x weekly - 3 sets - 10 reps Seated Hip Abduction with Resistance - 1 x daily - 7 x weekly - 3 sets - 10 reps Theracane Over Shoulder - 1 x daily - 7 x weekly - 3 sets - 10 reps     ASSESSMENT:   CLINICAL IMPRESSION: The patient had no increase in knee pain with leg press and hip abduction. We reviewed different cardio exercises to begin with . We then reviewed different gym exercises that she can use. She will be having her fitness assessment with Joakim on Monday. We will continue to expand the exercises she can do at the gym.    Objective impairments include decreased activity tolerance, decreased endurance, decreased mobility, decreased ROM, decreased strength, increased fascial restrictions, increased muscle spasms, and pain. These impairments are limiting patient from cleaning, meal prep, laundry, and shopping. Personal factors including 3+ comorbidities: lupus, L1 compression fx LE arthritis, knee arthritis   are also affecting patient's functional outcome. Patient will benefit from skilled PT to address above impairments and improve overall function.   REHAB POTENTIAL: Good   CLINICAL DECISION MAKING: Evolving/moderate complexity declining ability to stand and perform ADL's    EVALUATION COMPLEXITY: Moderate     GOALS: Goals reviewed with patient? Yes   SHORT TERM GOALS:   STG Name Target Date Goal status  1 Patient will increase bilateral gross hip strength by 5 lbs  Baseline:  10/22/2021 3/7 Improved significantly last time tested Achieved   2 Patient will demonstrate full  rotation bilateral without pain  Baseline:  10/22/2021 3/7 noticing improved rotation with LTR  Achieved   3 Patient will increase lumbar flexion by 20 degrees  Baseline: 10/22/2021 Reports functional improvement but not measured today.   dLONG TERM GOALS:    LTG Name Target Date Goal status  1 Patient will bend to pick items off the ground without pain  Baseline: 11/26/2021 INITIAL  2 Patient will ambulate 3000' without increased pain in order to perform ADL's  Baseline: 11/26/2021 INITIAL  3 Patient will stand for 2 hours without increased pain in order to perfrom ADL's  Baseline: 11/26/2021 INITIAL  PLAN: PT FREQUENCY: 2x/week   PT DURATION: 8 weeks   PLANNED INTERVENTIONS: Therapeutic exercises, Therapeutic activity, Neuro Muscular re-education, Gait training, Patient/Family education, Joint mobilization, Aquatic Therapy, Dry Needling, Electrical stimulation, Cryotherapy, Moist heat, Taping, and Manual therapy   PLAN FOR NEXT SESSION: continues to review exercises in the gym            Carney Living DPT  12/28/2021, 12:42 PM

## 2021-12-29 ENCOUNTER — Encounter (HOSPITAL_BASED_OUTPATIENT_CLINIC_OR_DEPARTMENT_OTHER): Payer: Self-pay | Admitting: Physical Therapy

## 2022-01-02 ENCOUNTER — Other Ambulatory Visit: Payer: Self-pay

## 2022-01-02 ENCOUNTER — Encounter (HOSPITAL_BASED_OUTPATIENT_CLINIC_OR_DEPARTMENT_OTHER): Payer: Self-pay | Admitting: Physical Therapy

## 2022-01-02 ENCOUNTER — Ambulatory Visit (HOSPITAL_BASED_OUTPATIENT_CLINIC_OR_DEPARTMENT_OTHER): Payer: Medicare Other | Admitting: Physical Therapy

## 2022-01-02 DIAGNOSIS — M545 Low back pain, unspecified: Secondary | ICD-10-CM | POA: Diagnosis not present

## 2022-01-02 DIAGNOSIS — R252 Cramp and spasm: Secondary | ICD-10-CM

## 2022-01-02 NOTE — Therapy (Signed)
?OUTPATIENT PHYSICAL THERAPY TREATMENT NOTE/ Progress Note  ? ? ?Patient Name: [name] ?MRN: [id number] ?DOB:[birth year], 76 y.o., female ?Today's Date: 10/27/2021 ? ?PCP: Cari Caraway, MD ?REFERRING PROVIDER: Cari Caraway, MD ? ? ?  ? ? ? ? ? ? ? ? ?Past Medical History:  ?Diagnosis Date  ? Allergy   ? Colon polyp   ? Compression fracture of L1 lumbar vertebra ([facility])   ? dx by ortho  ? Environmental allergies   ? Hypothyroid   ? Osteoarthritis   ? Osteoarthritis   ? Osteoarthritis   ? Osteopenia   ? Plantar fasciitis, right   ? Raynauds phenomenon   ? 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 Sjogren's syndrome ([place])   ? Systemic lupus ([place])   ? ?Past Surgical History:  ?Procedure Laterality Date  ? APPENDECTOMY    ? BUNIONECTOMY  2008  ? right foot  ? CATARACT EXTRACTION    ? COLONOSCOPY  12/05/2016  ? Dr.Jacobs  ? COLONOSCOPY W/ POLYPECTOMY  08/27/2011  ? ENDOMETRIAL ABLATION    ? FOOT SURGERY Right 12/17/2017  ? bone spur  ? ingrown toenail Right 12/16/2018  ? LAPAROSCOPIC APPENDECTOMY  10/11/2011  ? Procedure: APPENDECTOMY LAPAROSCOPIC;  Surgeon: Adin Hector, MD;  Location: [facility];  Service: General;  Laterality: N/A;  Partial Cecectomy and Appendectomy  ? POLYPECTOMY    ? WISDOM TOOTH EXTRACTION    ? ?Patient Active Problem List  ? Diagnosis Date Noted  ? Encounter for counseling 09/09/2018  ? Osteopenia of multiple sites 08/22/2017  ? History of vitamin D deficiency 08/22/2017  ? Autoimmune disease ([place]) 10/23/2016  ? Sjogren's syndrome ([place]) 10/09/2016  ? Primary osteoarthritis of both hands 10/09/2016  ? Primary osteoarthritis of both knees 10/09/2016  ? High risk medication use 09/12/2016  ? SBO (small bowel obstruction), partial postop 10/16/2011  ? 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 ?  ?PCP: Cari Caraway, MD ?  ?REFERRING PROVIDER: Cari Caraway, MD ?  ?REFERRING DIAG: S32.010D (ICD-10-CM) - Wedge compression fracture  of first lumbar vertebra, subsequent encounter for fracture with routine healing ?  ?THERAPY DIAG:  ?Low Back Pain    ?Upper Back Pain          ?  ?ONSET DATE: May 30th 2022  ?  ?SUBJECTIVE:                                                                                                                                                                                          ?  ?SUBJECTIVE STATEMENT: ? ?PERTINENT HISTORY:  ?Osteopenia, L1 compression  fx, OA of the knees and lumbar spine, PF and bone sur in the right foot with bone spur removal, raynaud, lupus Sjogrens  ?  ?PAIN:  ?Are you having pain? no 2/21 ?VAS scale: 1/10 no pain right now  ?Pain location: right and left knee  ? ?Pain orientation: Right and Left  ?PAIN TYPE: aching ?Pain description: intermittent  ?Aggravating factors: as the day progresses  ?Relieving factors: rest  ?  ?PRECAUTIONS: Other: osteopenia  ?  ?WEIGHT BEARING RESTRICTIONS No ?  ?FALLS:  ?Has patient fallen in last 6 months? No, Number of falls:  ?  ?LIVING ENVIRONMENT: ?  ?  ?OCCUPATION: retired  ?  ?[place]: does a lot of activity at her church. She would like to get back to that ?Vegetable garden.  ?  ?PLOF: Independent ?  ?PATIENT GOALS  ?  ?To be able to resume her normal activity all day without pain  ?  ?  ?OBJECTIV ?  ?TODAY'S TREATMENT  ?3/14 ?Manual: assessed trigger points none noted:  ? ?Nu-step 5 min UE/LE  ? ? Standing shoulder flexion 2lbs 2x10  ?Standing shoulder flexion 2lbs 2x10  ? ?Chops 2x10  ?Pallof press 2x10 both in the cable reviewed and updated HEP ? ? ? ? ? ?3/9 ? ? ?Seated elliptical 5 min reviewed set up and use  ? ?Leg press: reviewed both kinds 3x10 on cybex 55 lbs  ?Reviewed life styles  ? ?Life fitness hip abducton 2x15 25 lbs  ? ?Life fitness row machine 3x10 25 lbs  ? ?Reviewed sets and progression of activity ? ?Reviewed machines to avoid  ? ?3/7 ?Manual: STM to bilateral paraspinals: grade I and II PA glides to mid and upper thoracic spine  ? ?Bridge  2x10  ?SLR x15 each leg  ?Quad set X10 each leg  ? ?Reviewed FOTO and improvements  ?Reviewed strength measures and improvements ? ?Cable row 10lb 2x10  ?Cable extension 2x10 10 lbs  ? ? ? ? ? ? ?2/23 ? ?Pt seen for aquatic therapy today.  Treatment took place in water 3.25-4 ft in depth at the [facility] pool. Temp of water was 91?.  Pt entered/exited the pool via stairs (step through pattern) independently with bilat rail. ?  ? ?Warm up:Warm up walking forward, back and sidestepping x 4 widths each. ?Instruction on TrA bracing with exercises today. ?Side lunges with 1 foam hand buoys shoulder add/abd ? ?Seated ?Flutter kicking 3x20 ?Add/abd 3x20  ? ?STS from 3 step VC and demonstration for proper technique x10 ? ?Core strengthening ?Kick board push downs 3x 15 last with manual perturbation ?Resisted lateral rotations x 10 R/L using kick board ? ?Balance ?Forward and backward tandem walking supported by 1 foam hand buoy ?Small BOS with ue add/abd 1 foam hand buoys ?Tandem L/R leading x10 with ue add/abd ? ? ?Pt requires buoyancy for support and to offload joints with strengthening exercises. Viscosity of the water is needed for resistance of strengthening; water current perturbations provides challenge to standing balance unsupported, requiring increased core activation. ? ?  ? ? ?  ?PATIENT EDUCATION:  ?Education details:UE ore strengthening exercise technique  ? benefits of core and postural exercises  ?Person educated: Patient ?Education method: Explanation, Demonstration, Tactile cues, Verbal cues, and Handouts ?Education comprehension: verbalized understanding, returned demonstration, verbal cues required, tactile cues required, and needs further education ?  ?  ?HOME EXERCISE PROGRAM: ?Access Code: [email or web address] ?URL: [email or web address] ?Date: 10/01/2021 ?Prepared by: Carolyne Littles ?  ?Exercises ?Supine Lower  Trunk Rotation - 1 x daily - 7 x weekly - 3 sets - 10 reps ?Shoulder External  Rotation and Scapular Retraction with Resistance - 1 x daily - 7 x weekly - 3 sets - 10 reps ?Seated Hip Abduction with Resistance - 1 x daily - 7 x weekly - 3 sets - 10 reps ?Theracane Over Shoulder - 1 x daily - 7 x weekly - 3 sets - 10 reps ?  ?  ?ASSESSMENT: ?  ?CLINICAL IMPRESSION: ?The patient continues to make excellent progress. She had her meeting with the health and wellness specialist and she did very well. She had no significant increase in pain. She had no notable trigger points per palpation today. Therapy reviewed more functional activity that she can do at the gym. She will likley D/C next visit.  ? ? ?Objective impairments include decreased activity tolerance, decreased endurance, decreased mobility, decreased ROM, decreased strength, increased fascial restrictions, increased muscle spasms, and pain. These impairments are limiting patient from cleaning, meal prep, laundry, and shopping. Personal factors including 3+ comorbidities: lupus, L1 compression fx LE arthritis, knee arthritis   are also affecting patient's functional outcome. Patient will benefit from skilled PT to address above impairments and improve overall function. ?  ?REHAB POTENTIAL: Good ?  ?CLINICAL DECISION MAKING: Evolving/moderate complexity declining ability to stand and perform ADL's  ?  ?EVALUATION COMPLEXITY: Moderate ?  ?  ?GOALS: ?Goals reviewed with patient? Yes ?  ?SHORT TERM GOALS: ?  ?STG Name Target Date Goal status  ?1 Patient will increase bilateral gross hip strength by 5 lbs  ?Baseline:  10/22/2021 3/7 ?Improved significantly last time tested Achieved   ?2 Patient will demonstrate full rotation bilateral without pain  ?Baseline:  10/22/2021 3/7 ?noticing improved rotation with LTR  ?Achieved   ?3 Patient will increase lumbar flexion by 20 degrees  ?Baseline: 10/22/2021 Reports functional improvement but not measured today.   ?dLONG TERM GOALS:  ?  ?LTG Name Target Date Goal status  ?1 Patient will bend to pick items off  the ground without pain  ?Baseline: 11/26/2021 INITIAL  ?2 Patient will ambulate 3000' without increased pain in order to perform ADL's  ?Baseline: 11/26/2021 INITIAL  ?3 Patient will stand for 2 hours wit

## 2022-01-04 ENCOUNTER — Other Ambulatory Visit: Payer: Self-pay

## 2022-01-04 ENCOUNTER — Ambulatory Visit (HOSPITAL_BASED_OUTPATIENT_CLINIC_OR_DEPARTMENT_OTHER): Payer: Medicare Other | Admitting: Physical Therapy

## 2022-01-04 ENCOUNTER — Encounter (HOSPITAL_BASED_OUTPATIENT_CLINIC_OR_DEPARTMENT_OTHER): Payer: Self-pay | Admitting: Physical Therapy

## 2022-01-04 DIAGNOSIS — M545 Low back pain, unspecified: Secondary | ICD-10-CM | POA: Diagnosis not present

## 2022-01-04 NOTE — Therapy (Signed)
?OUTPATIENT PHYSICAL THERAPY TREATMENT NOTE/Discharge  ? ? ?Patient Name: [name] ?MRN: [id number] ?DOB:November 16, 1945, 76 y.o., female ?Today's Date: 10/27/2021 ? ?PCP: Cari Caraway, MD ?REFERRING PROVIDER: Cari Caraway, MD ? ? ?  ? ? ? ? ? ? ? ? ?Past Medical History:  ?Diagnosis Date  ? Allergy   ? Colon polyp   ? Compression fracture of L1 lumbar vertebra (HCC)   ? dx by ortho  ? Environmental allergies   ? Hypothyroid   ? Osteoarthritis   ? Osteoarthritis   ? Osteoarthritis   ? Osteopenia   ? Plantar fasciitis, right   ? Raynauds phenomenon   ? 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 Sjogren's syndrome ([place])   ? Systemic lupus ([place])   ? ?Past Surgical History:  ?Procedure Laterality Date  ? APPENDECTOMY    ? BUNIONECTOMY  2008  ? right foot  ? CATARACT EXTRACTION    ? COLONOSCOPY  12/05/2016  ? Dr.Jacobs  ? COLONOSCOPY W/ POLYPECTOMY  08/27/2011  ? ENDOMETRIAL ABLATION    ? FOOT SURGERY Right 12/17/2017  ? bone spur  ? ingrown toenail Right 12/16/2018  ? LAPAROSCOPIC APPENDECTOMY  10/11/2011  ? Procedure: APPENDECTOMY LAPAROSCOPIC;  Surgeon: Adin Hector, MD;  Location: [facility];  Service: General;  Laterality: N/A;  Partial Cecectomy and Appendectomy  ? POLYPECTOMY    ? WISDOM TOOTH EXTRACTION    ? ?Patient Active Problem List  ? Diagnosis Date Noted  ? Encounter for counseling 09/09/2018  ? Osteopenia of multiple sites 08/22/2017  ? History of vitamin D deficiency 08/22/2017  ? Autoimmune disease ([place]) 10/23/2016  ? Sjogren's syndrome ([place]) 10/09/2016  ? Primary osteoarthritis of both hands 10/09/2016  ? Primary osteoarthritis of both knees 10/09/2016  ? High risk medication use 09/12/2016  ? SBO (small bowel obstruction), partial postop 10/16/2011  ? 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 ?  ?PCP: Cari Caraway, MD ?  ?REFERRING PROVIDER: Cari Caraway, MD ?  ?REFERRING DIAG: S32.010D (ICD-10-CM) - Wedge compression fracture of  first lumbar vertebra, subsequent encounter for fracture with routine healing ?  ?THERAPY DIAG:  ?Low Back Pain    ?Upper Back Pain          ?  ?ONSET DATE: May 30th 2022  ?  ?SUBJECTIVE:                                                                                                                                                                                          ?  ?SUBJECTIVE STATEMENT: ?The patient has been doing well in the gym.  She feels comfortable with her complete program. ? ?PERTINENT HISTORY:  ?Osteopenia, L1 compression fx, OA of the knees and lumbar spine, PF and bone sur in the right foot with bone spur removal, raynaud, lupus Sjogrens  ?  ?PAIN:  ?Are you having pain? no 3/16 ?VAS scale: none only pain in the mornings and with activity  ?Pain location: right and left knee  ? ?Pain orientation: Right and Left  ?PAIN TYPE: aching ?Pain description: intermittent  ?Aggravating factors: as the day progresses  ?Relieving factors: rest  ?  ?PRECAUTIONS: Other: osteopenia  ?  ?WEIGHT BEARING RESTRICTIONS No ?  ?FALLS:  ?Has patient fallen in last 6 months? No, Number of falls:  ?  ?LIVING ENVIRONMENT: ?  ?  ?OCCUPATION: retired  ?  ?[place]: does a lot of activity at her church. She would like to get back to that ?Vegetable garden.  ?  ?PLOF: Independent ?  ?PATIENT GOALS  ?  ?To be able to resume her normal activity all day without pain  ?  ?  ?OBJECTIV ?  ?TODAY'S TREATMENT  ?3/16: Therapy reviewed the patients complete plan. Reviewed her basic exercises and progression of those exercises.  ? ?Supine march 2x10 with progression to switch ?Bridge with band: some difficuty at first but improved with reps 2x10;  ? ?Seated bilateral er green 2x10;   ?Horizontal abduction green 2x10  ?Shoulder flexion with band 2x10  ? ?Shoulder extension blue 2x15  ?Scap retraction blue 2x10  ? ?Reviewed gym class schedule with patient.  ? ? ?3/14 ?Manual: assessed trigger points none noted:  ? ?Nu-step 5 min UE/LE  ? ?  Standing shoulder flexion 2lbs 2x10  ?Standing shoulder flexion 2lbs 2x10  ? ?Chops 2x10  ?Pallof press 2x10 both in the cable reviewed and updated HEP ? ? ? ? ? ?3/9 ? ? ?Seated elliptical 5 min reviewed set up and use  ? ?Leg press: reviewed both kinds 3x10 on cybex 55 lbs  ?Reviewed life styles  ? ?Life fitness hip abducton 2x15 25 lbs  ? ?Life fitness row machine 3x10 25 lbs  ? ?Reviewed sets and progression of activity ? ?Reviewed machines to avoid  ? ?  ? ? ?  ?PATIENT EDUCATION:  ?Education details:UE ore strengthening exercise technique  ? benefits of core and postural exercises  ?Person educated: Patient ?Education method: Explanation, Demonstration, Tactile cues, Verbal cues, and Handouts ?Education comprehension: verbalized understanding, returned demonstration, verbal cues required, tactile cues required, and needs further education ?  ?  ?HOME EXERCISE PROGRAM: ?Access Code: [email or web address] ?URL: [email or web address] ?Date: 10/01/2021 ?Prepared by: Carolyne Littles ?  ?Exercises ?Supine Lower Trunk Rotation - 1 x daily - 7 x weekly - 3 sets - 10 reps ?Shoulder External Rotation and Scapular Retraction with Resistance - 1 x daily - 7 x weekly - 3 sets - 10 reps ?Seated Hip Abduction with Resistance - 1 x daily - 7 x weekly - 3 sets - 10 reps ?Theracane Over Shoulder - 1 x daily - 7 x weekly - 3 sets - 10 reps ?  ?  ?ASSESSMENT: ?  ?CLINICAL IMPRESSION: ?The patient has reached all goals for therapy. She continues to have pain at times, but it has improved. She has progressed to a full gym and pool program. Therapy reviewed base exersies for her to work on at home if she has any type of flair up. See below for goal specific progress. D/C to HEP.  ? ? ?Objective impairments include decreased activity tolerance,  decreased endurance, decreased mobility, decreased ROM, decreased strength, increased fascial restrictions, increased muscle spasms, and pain. These impairments are limiting patient from  cleaning, meal prep, laundry, and shopping. Personal factors including 3+ comorbidities: lupus, L1 compression fx LE arthritis, knee arthritis   are also affecting patient's functional outcome. Patient will benefit from skilled PT to address above impairments and improve overall function. ?  ?REHAB POTENTIAL: Good ?  ?CLINICAL DECISION MAKING: Evolving/moderate complexity declining ability to stand and perform ADL's  ?  ?EVALUATION COMPLEXITY: Moderate ?  ?  ?GOALS: ?Goals reviewed with patient? Yes ?  ?SHORT TERM GOALS: ?  ?STG Name Target Date Goal status  ?1 Patient will increase bilateral gross hip strength by 5 lbs  ?Baseline:  10/22/2021 3/16 ?Improved significantly last time tested Achieved   ?2 Patient will demonstrate full rotation bilateral without pain  ?Baseline:  10/22/2021 3/16 ?noticing improved rotation with LTR  ?Achieved   ?3 Patient will increase lumbar flexion by 20 degrees  ?Baseline: 10/22/2021 Achieved  ?Reports functional improvement but not measured today.   ?dLONG TERM GOALS:  ?  ?LTG Name Target Date Goal status  ?1 Patient will bend to pick items off the ground without pain  ?Baseline: 11/26/2021 Bending well  ?Achieved   ?2 Patient will ambulate 3000' without increased pain in order to perform ADL's  ?Baseline: 11/26/2021 Walking over 3000' pain after longer distances achieved   ?3 Patient will stand for 2 hours without increased pain in order to perfrom ADL's  ?Baseline: 11/26/2021 Achieved   ?PLAN: ?PT FREQUENCY: 2x/week ?  ?PT DURATION: 8 weeks ?  ?PLANNED INTERVENTIONS: Therapeutic exercises, Therapeutic activity, Neuro Muscular re-education, Gait training, Patient/Family education, Joint mobilization, Aquatic Therapy, Dry Needling, Electrical stimulation, Cryotherapy, Moist heat, Taping, and Manual therapy ?  ?PLAN FOR NEXT SESSION: continues to review exercises in the gym   ?  ?   ?  ? ?PHYSICAL THERAPY DISCHARGE SUMMARY ? ?Visits from Start of Care: 17 ? ?Current functional level related to  goals / functional outcomes: ?Improved pain; full program ?  ?Remaining deficits: ?Pain at times ?  ?Education / Equipment: ?HEP   ? ?Patient agrees to discharge. Patient goals were met. Patient is being discha

## 2022-01-22 NOTE — Telephone Encounter (Signed)
Ok to reapply for visco gel injections for both knees.  We can update x-rays of both knees at first procedure visit.

## 2022-01-23 ENCOUNTER — Telehealth: Payer: Self-pay | Admitting: Rheumatology

## 2022-01-23 NOTE — Telephone Encounter (Signed)
VOB submitted for Synvisc, bilateral knees BV pending 

## 2022-02-06 ENCOUNTER — Ambulatory Visit: Payer: Medicare Other | Admitting: Physician Assistant

## 2022-02-06 DIAGNOSIS — M170 Bilateral primary osteoarthritis of knee: Secondary | ICD-10-CM | POA: Diagnosis not present

## 2022-02-06 MED ORDER — LIDOCAINE HCL 1 % IJ SOLN
1.5000 mL | INTRAMUSCULAR | Status: AC | PRN
  Administered 2022-02-06: 1.5 mL via INTRA_ARTICULAR

## 2022-02-06 MED ORDER — HYLAN G-F 20 16 MG/2ML IX SOSY
16.0000 mg | PREFILLED_SYRINGE | INTRA_ARTICULAR | Status: AC | PRN
  Administered 2022-02-06: 16 mg via INTRA_ARTICULAR

## 2022-02-06 NOTE — Progress Notes (Signed)
? ?  Procedure Note ? ?Patient: [name]             ?Date of Birth: [birth year]           ?MRN: [id number]             ?Visit Date: 02/06/2022 ? ?Procedures: ?Visit Diagnoses:  ?1. Primary osteoarthritis of both knees   ? ?Synvisc #1 bilateral knees, B/B ?Large Joint Inj: bilateral knee on 02/06/2022 9:20 AM ?Indications: pain ?Details: 25 G 1.5 in needle, medial approach ? ?Arthrogram: No ? ?Medications (Right): 1.5 mL lidocaine 1 %; 16 mg Hylan 16 MG/2ML ?Aspirate (Right): 0 mL ?Medications (Left): 1.5 mL lidocaine 1 %; 16 mg Hylan 16 MG/2ML ?Aspirate (Left): 0 mL ?Outcome: tolerated well, no immediate complications ?Procedure, treatment alternatives, risks and benefits explained, specific risks discussed. Consent was given by the patient. Immediately prior to procedure a time out was called to verify the correct patient, procedure, equipment, support staff and site/side marked as required. Patient was prepped and draped in the usual sterile fashion.  ? ? ?Patient tolerated the procedure well.  Aftercare was discussed.  ?Hazel Sams, PA-C  ? ?

## 2022-02-13 ENCOUNTER — Ambulatory Visit: Payer: Medicare Other | Admitting: Physician Assistant

## 2022-02-13 DIAGNOSIS — M170 Bilateral primary osteoarthritis of knee: Secondary | ICD-10-CM | POA: Diagnosis not present

## 2022-02-13 MED ORDER — LIDOCAINE HCL 1 % IJ SOLN
1.5000 mL | INTRAMUSCULAR | Status: AC | PRN
  Administered 2022-02-13: 1.5 mL via INTRA_ARTICULAR

## 2022-02-13 MED ORDER — HYLAN G-F 20 16 MG/2ML IX SOSY
16.0000 mg | PREFILLED_SYRINGE | INTRA_ARTICULAR | Status: AC | PRN
  Administered 2022-02-13: 16 mg via INTRA_ARTICULAR

## 2022-02-13 NOTE — Progress Notes (Signed)
? ?  Procedure Note ? ?Patient: [name]             ?Date of Birth: [birth year]           ?MRN: [id number]             ?Visit Date: 02/13/2022 ? ?Procedures: ?Visit Diagnoses:  ?1. Primary osteoarthritis of both knees   ? ?Synvisc #2 bilateral knees, B/B ?Large Joint Inj: bilateral knee on 02/13/2022 8:55 AM ?Indications: pain ?Details: 25 G 1.5 in needle, medial approach ? ?Arthrogram: No ? ?Medications (Right): 1.5 mL lidocaine 1 %; 16 mg Hylan 16 MG/2ML ?Aspirate (Right): 0 mL ?Medications (Left): 1.5 mL lidocaine 1 %; 16 mg Hylan 16 MG/2ML ?Aspirate (Left): 0 mL ?Outcome: tolerated well, no immediate complications ?Procedure, treatment alternatives, risks and benefits explained, specific risks discussed. Consent was given by the patient. Immediately prior to procedure a time out was called to verify the correct patient, procedure, equipment, support staff and site/side marked as required. Patient was prepped and draped in the usual sterile fashion.  ? ? ? ?Patient tolerated the procedures well.  Aftercare was discussed.   ?Hazel Sams, PA-C ?

## 2022-02-19 ENCOUNTER — Ambulatory Visit: Payer: Medicare Other | Admitting: Physician Assistant

## 2022-02-19 ENCOUNTER — Other Ambulatory Visit: Payer: Self-pay | Admitting: Rheumatology

## 2022-02-20 ENCOUNTER — Ambulatory Visit: Payer: Medicare Other | Admitting: Physician Assistant

## 2022-02-20 DIAGNOSIS — Z79899 Other long term (current) drug therapy: Secondary | ICD-10-CM

## 2022-02-20 DIAGNOSIS — M1711 Unilateral primary osteoarthritis, right knee: Secondary | ICD-10-CM | POA: Diagnosis not present

## 2022-02-20 DIAGNOSIS — M3501 Sicca syndrome with keratoconjunctivitis: Secondary | ICD-10-CM

## 2022-02-20 DIAGNOSIS — M1712 Unilateral primary osteoarthritis, left knee: Secondary | ICD-10-CM

## 2022-02-20 DIAGNOSIS — M170 Bilateral primary osteoarthritis of knee: Secondary | ICD-10-CM

## 2022-02-20 MED ORDER — LIDOCAINE HCL 1 % IJ SOLN
1.5000 mL | INTRAMUSCULAR | Status: AC | PRN
  Administered 2022-02-20: 1.5 mL via INTRA_ARTICULAR

## 2022-02-20 MED ORDER — HYLAN G-F 20 16 MG/2ML IX SOSY
16.0000 mg | PREFILLED_SYRINGE | INTRA_ARTICULAR | Status: AC | PRN
  Administered 2022-02-20: 16 mg via INTRA_ARTICULAR

## 2022-02-20 NOTE — Telephone Encounter (Signed)
Next Visit: 03/13/2022 ? ?Last Visit: 10/10/2021 ? ?Last Fill: 11/28/2021 ? ?Dx: History of depression ? ?Current Dose per office note on 10/10/2021: not discussed ? ?Okay to refill Lexapro?   ?

## 2022-02-20 NOTE — Progress Notes (Signed)
? ?  Procedure Note ? ?Patient: [name]             ?Date of Birth: [birth year]           ?MRN: [id number]             ?Visit Date: 02/20/2022 ? ?Procedures: ?Visit Diagnoses:  ?1. Primary osteoarthritis of both knees   ?2. 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 High risk medication use   ? ? ?Synvisc #3 bilateral knees, B/B ?Large Joint Inj: bilateral knee on 02/20/2022 8:08 AM ?Indications: pain ?Details: 25 G 1.5 in needle, medial approach ? ?Arthrogram: No ? ?Medications (Right): 1.5 mL lidocaine 1 %; 16 mg Hylan 16 MG/2ML ?Aspirate (Right): 0 mL ?Medications (Left): 1.5 mL lidocaine 1 %; 16 mg Hylan 16 MG/2ML ?Aspirate (Left): 0 mL ?Outcome: tolerated well, no immediate complications ?Procedure, treatment alternatives, risks and benefits explained, specific risks discussed. Consent was given by the patient. Immediately prior to procedure a time out was called to verify the correct patient, procedure, equipment, support staff and site/side marked as required. Patient was prepped and draped in the usual sterile fashion.  ? ? ?Patient tolerated the procedure well.  Aftercare was discussed.  ?Hazel Sams, PA-C ? ?

## 2022-02-21 NOTE — Progress Notes (Signed)
CBC and CMP WNL. ESR and complements WNL.  UA normal.

## 2022-02-22 LAB — CBC WITH DIFFERENTIAL/PLATELET
Absolute Monocytes: 556 cells/uL (ref 200–950)
Basophils Absolute: 68 cells/uL (ref 0–200)
Basophils Relative: 1.3 %
Eosinophils Absolute: 328 cells/uL (ref 15–500)
Eosinophils Relative: 6.3 %
HCT: 42.5 % (ref 35.0–45.0)
Hemoglobin: 14.2 g/dL (ref 11.7–15.5)
Lymphs Abs: 1149 cells/uL (ref 850–3900)
MCH: 33 pg (ref 27.0–33.0)
MCHC: 33.4 g/dL (ref 32.0–36.0)
MCV: 98.8 fL (ref 80.0–100.0)
MPV: 10.7 fL (ref 7.5–12.5)
Monocytes Relative: 10.7 %
Neutro Abs: 3099 cells/uL (ref 1500–7800)
Neutrophils Relative %: 59.6 %
Platelets: 253 10*3/uL (ref 140–400)
RBC: 4.3 10*6/uL (ref 3.80–5.10)
RDW: 11.6 % (ref 11.0–15.0)
Total Lymphocyte: 22.1 %
WBC: 5.2 10*3/uL (ref 3.8–10.8)

## 2022-02-22 LAB — COMPLETE METABOLIC PANEL WITH GFR
AG Ratio: 1.5 (calc) (ref 1.0–2.5)
ALT: 14 U/L (ref 6–29)
AST: 21 U/L (ref 10–35)
Albumin: 4.3 g/dL (ref 3.6–5.1)
Alkaline phosphatase (APISO): 47 U/L (ref 37–153)
BUN: 13 mg/dL (ref 7–25)
CO2: 29 mmol/L (ref 20–32)
Calcium: 9.5 mg/dL (ref 8.6–10.4)
Chloride: 105 mmol/L (ref 98–110)
Creat: 0.7 mg/dL (ref 0.60–1.00)
Globulin: 2.8 g/dL (calc) (ref 1.9–3.7)
Glucose, Bld: 80 mg/dL (ref 65–99)
Potassium: 4.4 mmol/L (ref 3.5–5.3)
Sodium: 141 mmol/L (ref 135–146)
Total Bilirubin: 0.5 mg/dL (ref 0.2–1.2)
Total Protein: 7.1 g/dL (ref 6.1–8.1)
eGFR: 90 mL/min/{1.73_m2} (ref >=60)

## 2022-02-22 LAB — URINALYSIS, ROUTINE W REFLEX MICROSCOPIC
Bilirubin Urine: NEGATIVE
Glucose, UA: NEGATIVE
Hgb urine dipstick: NEGATIVE
Ketones, ur: NEGATIVE
Leukocytes,Ua: NEGATIVE
Nitrite: NEGATIVE
Protein, ur: NEGATIVE
Specific Gravity, Urine: 1.005 (ref 1.001–1.035)
pH: 7 (ref 5.0–8.0)

## 2022-02-22 LAB — C3 AND C4
C3 Complement: 139 mg/dL (ref 83–193)
C4 Complement: 28 mg/dL (ref 15–57)

## 2022-02-22 LAB — ANTI-NUCLEAR AB-TITER (ANA TITER): ANA Titer 1: 1:80 {titer} — ABNORMAL HIGH

## 2022-02-22 LAB — ANA: Anti Nuclear Antibody (ANA): POSITIVE — AB

## 2022-02-22 LAB — SEDIMENTATION RATE: Sed Rate: 11 mm/h (ref 0–30)

## 2022-02-24 ENCOUNTER — Other Ambulatory Visit: Payer: Self-pay | Admitting: Physician Assistant

## 2022-02-24 DIAGNOSIS — Z79899 Other long term (current) drug therapy: Secondary | ICD-10-CM

## 2022-02-24 DIAGNOSIS — M359 Systemic involvement of connective tissue, unspecified: Secondary | ICD-10-CM

## 2022-02-24 DIAGNOSIS — M3501 Sicca syndrome with keratoconjunctivitis: Secondary | ICD-10-CM

## 2022-02-26 ENCOUNTER — Ambulatory Visit: Payer: Medicare Other | Admitting: Physician Assistant

## 2022-02-26 NOTE — Telephone Encounter (Signed)
Next Visit: 03/13/2022 ? ?Last Visit: 10/10/2021 ? ?Labs: 02/20/2022, CBC and CMP WNL. ESR and complements WNL.  UA normal.  ? ?Eye exam: 10/02/2021   ? ?Current Dose per office note 10/10/2021:  Plaquenil 200 mg 1 tablet by mouth daily ? ?DX: Autoimmune disease  ? ?Last Fill: 12/11/2021 ? ?Okay to refill Plaquenil? ? ?

## 2022-02-26 NOTE — Progress Notes (Signed)
ANA remains a low positive.   ?

## 2022-02-27 NOTE — Progress Notes (Deleted)
Office Visit Note  Patient: [name]             Date of Birth: [birth year]           MRN: [id number]             PCP: Gweneth Dimitri, MD Referring: Gweneth Dimitri, MD Visit Date: 03/13/2022 Occupation: @GUAROCC @  Subjective:  No chief complaint on file.   History of Present Illness: [name] is a 76 y.o. female ***   Activities of Daily Living:  Patient reports morning stiffness for *** {minute/hour:19697}.   Patient {ACTIONS;DENIES/REPORTS:21021675::"Denies"} nocturnal pain.  Difficulty dressing/grooming: {ACTIONS;DENIES/REPORTS:21021675::"Denies"} Difficulty climbing stairs: {ACTIONS;DENIES/REPORTS:21021675::"Denies"} Difficulty getting out of chair: {ACTIONS;DENIES/REPORTS:21021675::"Denies"} Difficulty using hands for taps, buttons, cutlery, and/or writing: {ACTIONS;DENIES/REPORTS:21021675::"Denies"}  No Rheumatology ROS completed.   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   Sjogren's syndrome (HCC)    Systemic lupus (HCC)     Family History  Problem Relation Age of Onset   Heart disease  Mother    Psoriasis Father    Arthritis Father    Heart disease Brother    Heart attack Brother    Diabetes Maternal Grandfather    Cancer Maternal Uncle        colon   Colon cancer Maternal Uncle 65   Asthma Sister    Breast cancer Neg Hx    Past Surgical History:  Procedure Laterality Date   APPENDECTOMY     BUNIONECTOMY  2008   right foot   CATARACT EXTRACTION     COLONOSCOPY  12/05/2016   Dr.Jacobs   COLONOSCOPY W/ POLYPECTOMY  08/27/2011   ENDOMETRIAL ABLATION     FOOT SURGERY Right 12/17/2017   bone spur   ingrown toenail Right 12/16/2018   LAPAROSCOPIC APPENDECTOMY  10/11/2011   Procedure: APPENDECTOMY LAPAROSCOPIC;  Surgeon: Ardeth Sportsman, MD;  Location: [facility];  Service: General;  Laterality: N/A;  Partial Cecectomy and Appendectomy   POLYPECTOMY     WISDOM TOOTH EXTRACTION     Social History   Social History Narrative   Not on file   Immunization History  Administered Date(s) Administered   DTaP 06/16/2021   Influenza, High Dose Seasonal PF 07/05/2018, 06/18/2019   Influenza,inj,quad, With Preservative 07/10/2017   Influenza-Unspecified 07/23/2020, 07/12/2021   PFIZER(Purple Top)SARS-COV-2 Vaccination 11/16/2019, 12/07/2019, 06/14/2020, 05/16/2021   Pneumococcal Conjugate-13 09/08/2014   Pneumococcal Polysaccharide-23 08/30/2011   Tdap 06/20/2011, 06/16/2021   Zoster Recombinat (Shingrix) 02/05/2017, 05/08/2017   Zoster, Live 10/11/2007     Objective: Vital Signs: There were no vitals taken for  this visit.   Physical Exam   Musculoskeletal Exam: ***  CDAI Exam: CDAI Score: -- Patient Global: --; Provider Global: -- Swollen: --; Tender: -- Joint Exam 03/13/2022   No joint exam has been documented for this visit   There is currently no information documented on the homunculus. Go to the Rheumatology activity and complete the homunculus joint exam.  Investigation: No additional findings.  Imaging: No results found.  Recent Labs: Lab  Results  Component Value Date   WBC 5.2 02/20/2022   HGB 14.2 02/20/2022   PLT 253 02/20/2022   NA 141 02/20/2022   K 4.4 02/20/2022   CL 105 02/20/2022   CO2 29 02/20/2022   GLUCOSE 80 02/20/2022   BUN 13 02/20/2022   CREATININE 0.70 02/20/2022   BILITOT 0.5 02/20/2022   ALKPHOS 48 02/11/2017   AST 21 02/20/2022   ALT 14 02/20/2022   PROT 7.1 02/20/2022   ALBUMIN 3.9 02/11/2017   CALCIUM 9.5 02/20/2022   GFRAA 92 01/18/2021    Speciality Comments: PLQ eye exam: 10/02/2021 Normal. Dr. Jethro Bolus. Follow up in 1 year. (in CareEverywhere)  Procedures:  No procedures performed Allergies: Boniva [ibandronic acid], Latex, Sulfonamide derivatives, and Bacitracin-polymyxin b   Assessment / Plan:     Visit Diagnoses: No diagnosis found.  Orders: No orders of the defined types were placed in this encounter.  No orders of the defined types were placed in this encounter.   Face-to-face time spent with patient was *** minutes. Greater than 50% of time was spent in counseling and coordination of care.  Follow-Up Instructions: No follow-ups on file.   Ellen Henri, CMA  Note - This record has been created using Animal nutritionist.  Chart creation errors have been sought, but may not always  have been located. Such creation errors do not reflect on  the standard of medical care.

## 2022-03-05 ENCOUNTER — Ambulatory Visit: Payer: Medicare Other | Admitting: Physician Assistant

## 2022-03-12 NOTE — Progress Notes (Unsigned)
Office Visit Note  Patient: [name]             Date of Birth: 11/11/1945           MRN: [id number]             PCP: Cari Caraway, MD Referring: Cari Caraway, MD Visit Date: 03/14/2022 Occupation: @GUAROCC @  Subjective:  Pain in both knees  History of Present Illness: [name] is a 76 y.o. female with history of autoimmune disease, Sjogren's syndrome, and osteoarthritis.  She is taking plaquenil 200 mg 1 tablet by mouth daily.  She continues to tolerate Plaquenil without any side effects and has not missed any doses recently.  She continues to have chronic sicca symptoms.  She denies any oral ulcerations or nasal ulcerations.  She has occasional symptoms of Raynaud's but no digital ulcerations.  No facial rashes.  She has ongoing photosensitivity and tries to avoid direct sun exposure while outdoors.  She denies any increased shortness of breath, cough, or palpitations. She continues to experience a buckling sensation in both knees intermittently.  Her discomfort is most severe in the right knee.  She has noticed some improvement in her knee joint pain since having Visco gel injections in April/May 2023.  She will be starting physical therapy to improve lower extremity muscle strengthening and fall prevention at [facility].  She has an upcoming appointment with Dr. Wynelle Link on June to further discuss possibly proceeding with a knee replacement in the future.  She continues to experience some discomfort in her lower back.  She is followed closely by Dr. Nelva Bush.  She has considered proceeding with radiofrequency ablation in the future if her symptoms persist or worsen.  She has been taking tramadol very sparingly for pain relief.  She denies any other increased joint pain or joint swelling at this time. She denies any recent falls or fractures.  She remains on Prolia 60 mg subcutaneous injections every 6 months for osteoporosis.  She is taking a calcium and vitamin D  supplement daily.     Activities of Daily Living:  Patient reports morning stiffness for 1 minute.   Patient Reports nocturnal pain.  Difficulty dressing/grooming: Denies Difficulty climbing stairs: Denies Difficulty getting out of chair: Denies Difficulty using hands for taps, buttons, cutlery, and/or writing: Reports  Review of Systems  Constitutional:  Positive for fatigue.  HENT:  Positive for mouth dryness.   Eyes:  Positive for dryness.  Respiratory:  Negative for shortness of breath.   Cardiovascular:  Negative for swelling in legs/feet.  Gastrointestinal:  Negative for constipation.  Endocrine: Negative for excessive thirst.  Genitourinary:  Negative for difficulty urinating.  Musculoskeletal:  Positive for joint pain, joint pain, muscle weakness and morning stiffness.  Skin:  Negative for rash.  Allergic/Immunologic: Negative for susceptible to infections.  Neurological:  Positive for weakness.  Hematological:  Positive for bruising/bleeding tendency.  Psychiatric/Behavioral:  Positive for sleep disturbance.    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% 07/20/2009    Past Medical History:  Diagnosis Date   Allergy    Atherosclerosis    Colon polyp    Compression fracture of L1 lumbar vertebra (HCC)    dx by ortho   Environmental allergies    Hypothyroid    Osteoarthritis    Osteoarthritis    Osteoarthritis    Osteopenia    Plantar fasciitis, right     Raynauds phenomenon    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   Sjogren's syndrome ([place])    Systemic lupus ([place])     Family History  Problem Relation Age of Onset   Heart disease Mother    Psoriasis Father    Arthritis Father    Heart disease Brother    Heart attack Brother    Diabetes Maternal Grandfather    Cancer Maternal Uncle        colon   Colon cancer Maternal Uncle 80   Asthma Sister    Breast cancer Neg Hx    Past Surgical History:  Procedure Laterality Date   APPENDECTOMY     BUNIONECTOMY  2008   right foot   CATARACT EXTRACTION     COLONOSCOPY  12/05/2016   Dr.Jacobs   COLONOSCOPY W/ POLYPECTOMY  08/27/2011   ENDOMETRIAL ABLATION     FOOT SURGERY Right 12/17/2017   bone spur   ingrown toenail Right 12/16/2018   LAPAROSCOPIC APPENDECTOMY  10/11/2011   Procedure: APPENDECTOMY LAPAROSCOPIC;  Surgeon: Adin Hector, MD;  Location: [facility];  Service: General;  Laterality: N/A;  Partial Cecectomy and Appendectomy   POLYPECTOMY     WISDOM TOOTH EXTRACTION     Social History   Social History Narrative   Not on file   Immunization History  Administered Date(s) Administered   DTaP 06/16/2021   Influenza, High Dose Seasonal PF 07/05/2018, 06/18/2019   Influenza,inj,quad, With Preservative 07/10/2017   Influenza-Unspecified 07/23/2020, 07/12/2021   PFIZER(Purple Top)SARS-COV-2 Vaccination 11/16/2019, 12/07/2019, 06/14/2020, 05/16/2021   Pneumococcal Conjugate-13 09/08/2014   Pneumococcal Polysaccharide-23 08/30/2011   Tdap 06/20/2011, 06/16/2021   Zoster Recombinat (Shingrix) 02/05/2017, 05/08/2017   Zoster, Live 10/11/2007     Objective: Vital Signs: BP 131/76 (BP Location: Left Arm, Patient Position: Sitting, Cuff Size: Small)   Pulse 60   Resp 12   Ht $R'5\' 5"'TX$  (1.651 m)   Wt 149 lb 6.4 oz (67.8 kg)   BMI 24.86 kg/m    Physical Exam Vitals and nursing note reviewed.  Constitutional:      Appearance: She is well-developed.  HENT:      Head: Normocephalic and atraumatic.  Eyes:     Conjunctiva/sclera: Conjunctivae normal.  Cardiovascular:     Rate and Rhythm: Normal rate and regular rhythm.     Heart sounds: Normal heart sounds.  Pulmonary:     Effort: Pulmonary effort is normal.     Breath sounds: Normal breath sounds.  Abdominal:     General: Bowel sounds are normal.     Palpations: Abdomen is soft.  Musculoskeletal:     Cervical back: Normal range of motion.  Skin:    General: Skin is warm and dry.     Capillary Refill: Capillary refill takes less than 2 seconds.  Neurological:     Mental Status: She is alert and oriented to person, place, and time.  Psychiatric:        Behavior: Behavior normal.     Musculoskeletal Exam: C-spine and thoracic spine have good ROM.  Midline spinal tenderness in  the lumbar region.  Shoulder joints, elbow joints, wrist joints, MCPs, PIPs, and DIPs good ROM with no synovitis.  Complete fist formation bilaterally.  PIP and DIP thickening consistent with osteoarthritis of both hands.  [place]quilla joint prominence bilaterally.  Subluxation of bilateral 1st MCPs.  Hip joints have good ROM with no groin pain.  Tenderness over both trochanteric bursa, left > right.  Knee joints have good ROM with discomfort in the right knee with full extension.  No warmth or effusion noted.  Ankle joints have good ROM with no tenderness or joint swelling.   CDAI Exam: CDAI Score: -- Patient Global: --; Provider Global: -- Swollen: --; Tender: -- Joint Exam 03/14/2022   No joint exam has been documented for this visit   There is currently no information documented on the homunculus. Go to the Rheumatology activity and complete the homunculus joint exam.  Investigation: No additional findings.  Imaging: No results found.  Recent Labs: Lab Results  Component Value Date   WBC 5.2 02/20/2022   HGB 14.2 02/20/2022   PLT 253 02/20/2022   NA 141 02/20/2022   K 4.4 02/20/2022   CL 105 02/20/2022   CO2 29  02/20/2022   GLUCOSE 80 02/20/2022   BUN 13 02/20/2022   CREATININE 0.70 02/20/2022   BILITOT 0.5 02/20/2022   ALKPHOS 48 02/11/2017   AST 21 02/20/2022   ALT 14 02/20/2022   PROT 7.1 02/20/2022   ALBUMIN 3.9 02/11/2017   CALCIUM 9.5 02/20/2022   GFRAA 92 01/18/2021    Speciality Comments: PLQ eye exam: 10/02/2021 Normal. Dr. Rutherford Guys. Follow up in 1 year. (in [place])  Procedures:  No procedures performed Allergies: Boniva [ibandronic acid], Latex, Sulfonamide derivatives, and Bacitracin-polymyxin b    Assessment / Plan:     Visit Diagnoses: Sjogren's syndrome with keratoconjunctivitis sicca ([place]) - ANA+, Ro+, sicca symptoms: Discussed the diagnosis and clinical features of Sjogren's syndrome today in detail.  She continues to have chronic sicca symptoms which have been tolerable overall.  She has no cervical lymphadenopathy or parotid swelling or tenderness.  No signs of inflammatory arthritis were noted on examination today.  She has occasional arthralgias and joint stiffness but has no synovitis on examination. She remains on Plaquenil 200 mg 1 tablet by mouth daily.  She is tolerating Plaquenil without any side effects and has not missed any doses recently.  Overall her symptoms have been stable on the current treatment regimen. Lab work from 02/20/22 was reviewed today in the office: ANA 1:80NS, complements WNL, ESR WNL, no proteinuria, CBC and CMP WNL.  I also reviewed labs from 10/10/2021: SPEP did not reveal any abnormal protein bands, RF negative, ESR within normal limits, double-stranded DNA negative, complements within normal limits, no proteinuria, Ro antibody positive. Discussed the 4 to 14 fold increased risk for developing lymphoma in patients with Sjogren's syndrome.  Future order for SPEP, ESR, CBC with differential, and RF were placed today for monitoring purposes. I also discussed the increased risk for developing ILD in patients with Sjogren's syndrome.  Her  lungs were clear to auscultation on examination today.  She is not experiencing any new or worsening pulmonary symptoms.  Revealed chest CT results from 08/29/2021. Discussed signs and symptoms to monitor for which would be suspicious for progression of the disease.  She will remain on the current dose of Plaquenil.  She was advised to notify us if she develops any signs or symptoms of a flare.  She will follow-up in the  office in 5 months or sooner if needed.  She plans on returning for lab work prior to her office visit.  Future orders were placed today. - Plan: CBC with Differential/Platelet, COMPLETE METABOLIC PANEL WITH GFR, Urinalysis, Routine w reflex microscopic, C3 and C4, Sedimentation rate, ANA, Serum protein electrophoresis with reflex, Rheumatoid factor, Sjogrens syndrome-A extractable nuclear antibody   Autoimmune disease ([place]) - Positive ANA, Raynauds, inflammatory arthritis: Patient is clinically doing well taking Plaquenil 200 mg 1 tablet by mouth daily.  Her symptoms have been manageable without any signs or symptoms of a flare.  Lab results from 02/20/22 were reviewed with the patient today in the office.- Plan: C3 and C4, Sedimentation rate, ANA, Anti-DNA antibody, double-stranded  High risk medication use - Plaquenil 200 mg 1 tablet by mouth daily.  She is tolerating Plaquenil without any side effects. Discussed that Plaquenil increases the risk for sun sensitivity.  The patient was encouraged to wear sunscreen SPF 50 on a daily basis and avoid direct sun exposure. CBC and CMP WNL on 02/20/22.  Her next lab work will be due in 5 months.  Future orders for CBC and CMP were placed today. PLQ eye exam: 10/02/2021 Normal. Dr. Rutherford Guys. Follow up in 1 year. (in [place])  - Plan: CBC with Differential/Platelet, COMPLETE METABOLIC PANEL WITH GFR  Primary osteoarthritis of both hands: She has PIP and DIP thickening consistent with osteoarthritis of both hands.  CMC joint thickening and  prominence noted bilaterally.  Subluxation of bilateral first MCP joints noted.  Complete fist formation bilaterally.  No signs of inflammatory arthritis were noted.  Discussed the importance of joint protection and muscle strengthening.  Primary osteoarthritis of both knees: She had updated x-rays of both knees at a recent appointment at [facility].  According to the patient there has been some progression in arthritis in both knees especially her right knee.  She has noticed some improvement in her knee joint pain and stiffness since undergoing Visco gel injections in April/May 2023.  She continues to have occasional buckling in both knees especially her right knee.  She is concerned that she may have a fall due to the instability.  She has an upcoming appointment with Dr. Wynelle Link in June to further discuss proceeding with a knee replacement in the future.  She will be starting physical therapy for lower extremity muscle strengthening and fall prevention.  Trochanteric bursitis of left hip: She has tenderness to palpation over bilateral trochanteric bursa, left greater than right.  She has been performing stretching exercises on a regular basis to alleviate her symptoms.  Osteopenia of multiple sites - DEXA on 05/03/21: The BMD measured at Femur Neck Right is 0.759 g/cm2 with a T-score of -2.0.  History of L1 compression fracture after a fall.  She is on Prolia 60 mg subcutaneous injections once every 6 months and is taking calcium and vitamin D supplement daily.  She has not had any recent falls.  She will be starting physical therapy for lower extremity muscle strengthening and fall prevention.  Closed compression fracture of body of L1 vertebra (HCC) - She acquired the L1 compression fracture after a fall on the concrete ground.  She could not get kyphoplasty due to the pathology of the fracture.   Vitamin D deficiency: She is taking calcium and vitamin D supplement daily.  Other fatigue:  Stable.  Discussed the importance of regular exercise.  History of anxiety: She remains on Lexapro as prescribed.  History of depression: She  remains on Lexapro as prescribed.  Hypothyroidism, adult: She remains on Synthroid as prescribed.  Orders: Orders Placed This Encounter  Procedures   CBC with Differential/Platelet   COMPLETE METABOLIC PANEL WITH GFR   Urinalysis, Routine w reflex microscopic   C3 and C4   Sedimentation rate   ANA   Serum protein electrophoresis with reflex   Rheumatoid factor   Sjogrens syndrome-A extractable nuclear antibody   Anti-DNA antibody, double-stranded   No orders of the defined types were placed in this encounter.   Follow-Up Instructions: Return in about 5 months (around 08/14/2022) for Autoimmune Disease, Osteoarthritis.   Ofilia Neas, PA-C  Note - This record has been created using Dragon software.  Chart creation errors have been sought, but may not always  have been located. Such creation errors do not reflect on  the standard of medical care.

## 2022-03-13 ENCOUNTER — Ambulatory Visit: Payer: Medicare Other | Admitting: Physician Assistant

## 2022-03-13 DIAGNOSIS — E559 Vitamin D deficiency, unspecified: Secondary | ICD-10-CM

## 2022-03-13 DIAGNOSIS — M359 Systemic involvement of connective tissue, unspecified: Secondary | ICD-10-CM

## 2022-03-13 DIAGNOSIS — M170 Bilateral primary osteoarthritis of knee: Secondary | ICD-10-CM

## 2022-03-13 DIAGNOSIS — M7062 Trochanteric bursitis, left hip: Secondary | ICD-10-CM

## 2022-03-13 DIAGNOSIS — S32010A Wedge compression fracture of first lumbar vertebra, initial encounter for closed fracture: Secondary | ICD-10-CM

## 2022-03-13 DIAGNOSIS — Z79899 Other long term (current) drug therapy: Secondary | ICD-10-CM

## 2022-03-13 DIAGNOSIS — M8589 Other specified disorders of bone density and structure, multiple sites: Secondary | ICD-10-CM

## 2022-03-13 DIAGNOSIS — M3501 Sicca syndrome with keratoconjunctivitis: Secondary | ICD-10-CM

## 2022-03-13 DIAGNOSIS — E039 Hypothyroidism, unspecified: Secondary | ICD-10-CM

## 2022-03-13 DIAGNOSIS — Z8659 Personal history of other mental and behavioral disorders: Secondary | ICD-10-CM

## 2022-03-13 DIAGNOSIS — M19041 Primary osteoarthritis, right hand: Secondary | ICD-10-CM

## 2022-03-13 DIAGNOSIS — R5383 Other fatigue: Secondary | ICD-10-CM

## 2022-03-14 ENCOUNTER — Encounter: Payer: Self-pay | Admitting: Physician Assistant

## 2022-03-14 ENCOUNTER — Ambulatory Visit: Payer: Medicare Other | Admitting: Physician Assistant

## 2022-03-14 VITALS — BP 131/76 | HR 60 | Resp 12 | Ht 65.0 in | Wt 149.4 lb

## 2022-03-14 DIAGNOSIS — M170 Bilateral primary osteoarthritis of knee: Secondary | ICD-10-CM

## 2022-03-14 DIAGNOSIS — M19041 Primary osteoarthritis, right hand: Secondary | ICD-10-CM | POA: Diagnosis not present

## 2022-03-14 DIAGNOSIS — E559 Vitamin D deficiency, unspecified: Secondary | ICD-10-CM

## 2022-03-14 DIAGNOSIS — Z8659 Personal history of other mental and behavioral disorders: Secondary | ICD-10-CM

## 2022-03-14 DIAGNOSIS — S32010A Wedge compression fracture of first lumbar vertebra, initial encounter for closed fracture: Secondary | ICD-10-CM

## 2022-03-14 DIAGNOSIS — M8589 Other specified disorders of bone density and structure, multiple sites: Secondary | ICD-10-CM

## 2022-03-14 DIAGNOSIS — M3501 Sicca syndrome with keratoconjunctivitis: Secondary | ICD-10-CM

## 2022-03-14 DIAGNOSIS — M19042 Primary osteoarthritis, left hand: Secondary | ICD-10-CM

## 2022-03-14 DIAGNOSIS — R5383 Other fatigue: Secondary | ICD-10-CM

## 2022-03-14 DIAGNOSIS — M359 Systemic involvement of connective tissue, unspecified: Secondary | ICD-10-CM | POA: Diagnosis not present

## 2022-03-14 DIAGNOSIS — Z79899 Other long term (current) drug therapy: Secondary | ICD-10-CM | POA: Diagnosis not present

## 2022-03-14 DIAGNOSIS — M7062 Trochanteric bursitis, left hip: Secondary | ICD-10-CM

## 2022-03-14 DIAGNOSIS — E039 Hypothyroidism, unspecified: Secondary | ICD-10-CM

## 2022-03-28 ENCOUNTER — Other Ambulatory Visit: Payer: Self-pay | Admitting: Physician Assistant

## 2022-03-28 DIAGNOSIS — M3501 Sicca syndrome with keratoconjunctivitis: Secondary | ICD-10-CM

## 2022-03-28 DIAGNOSIS — Z79899 Other long term (current) drug therapy: Secondary | ICD-10-CM

## 2022-03-28 DIAGNOSIS — M359 Systemic involvement of connective tissue, unspecified: Secondary | ICD-10-CM

## 2022-03-31 ENCOUNTER — Other Ambulatory Visit: Payer: Self-pay | Admitting: Physician Assistant

## 2022-03-31 DIAGNOSIS — Z79899 Other long term (current) drug therapy: Secondary | ICD-10-CM

## 2022-03-31 DIAGNOSIS — M3501 Sicca syndrome with keratoconjunctivitis: Secondary | ICD-10-CM

## 2022-03-31 DIAGNOSIS — M359 Systemic involvement of connective tissue, unspecified: Secondary | ICD-10-CM

## 2022-05-07 ENCOUNTER — Other Ambulatory Visit: Payer: Self-pay | Admitting: Physician Assistant

## 2022-05-07 DIAGNOSIS — M3501 Sicca syndrome with keratoconjunctivitis: Secondary | ICD-10-CM

## 2022-05-07 DIAGNOSIS — Z79899 Other long term (current) drug therapy: Secondary | ICD-10-CM

## 2022-05-07 DIAGNOSIS — M359 Systemic involvement of connective tissue, unspecified: Secondary | ICD-10-CM

## 2022-05-07 NOTE — Telephone Encounter (Signed)
Next Visit: 08/22/2022  Last Visit: 03/14/2022  Labs: 02/20/2022 CBC and CMP WNL.  Eye exam: 10/02/2021 Normal   Current Dose per office note 03/14/2022:  Plaquenil 200 mg 1 tablet by mouth daily  DX: Sjogren's syndrome with keratoconjunctivitis sicca   Last Fill: 02/26/2022  Okay to refill Plaquenil?

## 2022-05-15 ENCOUNTER — Other Ambulatory Visit: Payer: Self-pay | Admitting: Physician Assistant

## 2022-05-16 NOTE — Telephone Encounter (Signed)
Next Visit: 08/22/2022  Last Visit: 03/14/2022  Last Fill: 02/20/2022  Dx: History of anxiety  Current Dose per office note on 03/14/2022: She remains on Lexapro as prescribed  Okay to refill Lexapro?

## 2022-07-06 ENCOUNTER — Encounter: Payer: Self-pay | Admitting: Rheumatology

## 2022-07-09 ENCOUNTER — Other Ambulatory Visit: Payer: Self-pay | Admitting: Family Medicine

## 2022-07-09 DIAGNOSIS — Z1231 Encounter for screening mammogram for malignant neoplasm of breast: Secondary | ICD-10-CM

## 2022-07-11 ENCOUNTER — Other Ambulatory Visit: Payer: Self-pay | Admitting: Physician Assistant

## 2022-07-11 DIAGNOSIS — M359 Systemic involvement of connective tissue, unspecified: Secondary | ICD-10-CM

## 2022-07-11 DIAGNOSIS — Z79899 Other long term (current) drug therapy: Secondary | ICD-10-CM

## 2022-07-11 DIAGNOSIS — M3501 Sicca syndrome with keratoconjunctivitis: Secondary | ICD-10-CM

## 2022-07-11 NOTE — Telephone Encounter (Signed)
Next Visit: 08/22/2022   Last Visit: 03/14/2022   Labs: 02/20/2022 CBC and CMP WNL.   Eye exam: 10/02/2021 Normal    Current Dose per office note 03/14/2022:  Plaquenil 200 mg 1 tablet by mouth daily   DX: Sjogren's syndrome with keratoconjunctivitis sicca    Last Fill: 05/07/2022   Okay to refill Plaquenil?

## 2022-07-31 ENCOUNTER — Ambulatory Visit: Payer: Medicare Other | Admitting: Student

## 2022-07-31 NOTE — Progress Notes (Signed)
Synopsis: Referred for pulmonary nodules by Cari Caraway, MD  Subjective:   PATIENT ID: [name] GENDER: female DOB: [birth year], MRN: [id number]  Chief Complaint  Patient presents with   Follow-up    No concerns    75yF with history of Sjogren's, SLE, never smoker who is referred for multiple pulmonary nodules seen on cardiac CT 07/14/21.  She has no cough, hemoptysis, fever, night sweats. She has had some weight loss (10 lb since June) - she has been trying to lose a little weight but her weight loss even preceded these efforts.   Never smoked. No direct family history of lung cancer, breast cancer. Did have maternal uncle that had colon cancer. Aunt with lung cancer.   Plaquenil is the only agent that she is on for SLE.  Interval HPI:  She says she's overall doing well. She has no cough. No change in weight, night sweats, fever. No major recent infections. No dyspnea.   Otherwise pertinent review of systems is negative.   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   Sjogren's syndrome ([place])    Systemic lupus ([place])      Family History  Problem Relation Age of Onset   Heart disease Mother    Psoriasis Father    Arthritis Father    Heart disease Brother    Heart attack Brother    Diabetes Maternal Grandfather    Cancer Maternal Uncle        colon   Colon cancer Maternal Uncle 80   Asthma Sister    Breast cancer Neg Hx      Past Surgical History:  Procedure Laterality Date   APPENDECTOMY     BUNIONECTOMY  2008   right foot   CATARACT EXTRACTION     COLONOSCOPY  12/05/2016   Dr.Jacobs   COLONOSCOPY W/ POLYPECTOMY  08/27/2011    ENDOMETRIAL ABLATION     FOOT SURGERY Right 12/17/2017   bone spur   ingrown toenail Right 12/16/2018   LAPAROSCOPIC APPENDECTOMY  10/11/2011   Procedure: APPENDECTOMY LAPAROSCOPIC;  Surgeon: Adin Hector, MD;  Location: [facility];  Service: General;  Laterality: N/A;  Partial Cecectomy and Appendectomy   POLYPECTOMY     WISDOM TOOTH EXTRACTION      Social History   Socioeconomic History   Marital status: Married    Spouse name: Not on file   Number of children: Not on file   Years of education: Not on file   Highest education level: Not on file  Occupational History   Not on file  Tobacco Use   Smoking status: Never    Passive exposure: Past   Smokeless tobacco: Never  Vaping Use   Vaping Use: Never used  Substance and Sexual Activity   Alcohol use: No   Drug use: No   Sexual activity: Yes    Birth control/protection: Post-menopausal  Other Topics Concern   Not on file  Social History Narrative   Not on file   Social Determinants of Health   Financial Resource Strain: Not on file  Food Insecurity:  Not on file  Transportation Needs: Not on file  Physical Activity: Not on file  Stress: Not on file  Social Connections: Not on file  Intimate Partner Violence: Not on file     Allergies  Allergen Reactions   Boniva [Ibandronic Acid] Other (See Comments)    Pain in legs   Latex Itching   Sulfonamide Derivatives     REACTION: headache   Bacitracin-Polymyxin B Rash     Outpatient Medications Prior to Visit  Medication Sig Dispense Refill   acetaminophen (TYLENOL) 650 MG CR tablet      aspirin 81 MG tablet Take 81 mg by mouth at bedtime.     calcium carbonate (OS-CAL) 600 MG tablet 1 tablet with meals     Cholecalciferol (VITAMIN D) 2000 units CAPS Take by mouth daily.     denosumab (PROLIA) 60 MG/ML SOSY injection Inject 60 mg into the skin every 6 (six) months.     escitalopram (LEXAPRO) 10 MG tablet TAKE 1 TABLET BY MOUTH DAILY 90 tablet 0   fexofenadine  (ALLEGRA) 180 MG tablet Take 180 mg by mouth daily.     fluticasone (FLONASE) 50 MCG/ACT nasal spray 2 sprays in each nostril     folic acid (FOLVITE) 1 MG tablet Take 0.8 mg by mouth daily.      hydroxychloroquine (PLAQUENIL) 200 MG tablet TAKE 1 TABLET BY MOUTH DAILY 90 tablet 0   levothyroxine (SYNTHROID, LEVOTHROID) 88 MCG tablet Take 88 mcg by mouth every morning.     Multiple Vitamin (MULTIVITAMIN) tablet Take 1 tablet by mouth daily.     Omega-3 Fatty Acids (FISH OIL) 1000 MG CAPS      rosuvastatin (CRESTOR) 10 MG tablet Take by mouth.     Thiamine HCl (VITAMIN B-1) 100 MG tablet Take 100 mg by mouth daily.     traMADol (ULTRAM) 50 MG tablet tramadol 50 mg tablet  TAKE 1 TABLET BY MOUTH 3 TIMES DAILY AS NEEDED (INS PAYS FOR 7 DAY SUPPLY)     triamcinolone cream (KENALOG) 0.1 %      trimethoprim (TRIMPEX) 100 MG tablet Take 100 mg by mouth daily.     TURMERIC PO Take 1 capsule by mouth daily.      zolpidem (AMBIEN) 5 MG tablet Take 5 mg by mouth at bedtime as needed for sleep.     diclofenac Sodium (VOLTAREN) 1 % GEL      docusate sodium (COLACE) 100 MG capsule      No facility-administered medications prior to visit.       Objective:   Physical Exam:  General appearance: 76 y.o., female, NAD, conversant  Eyes: anicteric sclerae; PERRL, tracking appropriately HENT: NCAT; MMM Neck: Trachea midline; no lymphadenopathy, no JVD Lungs: CTAB, no crackles, no wheeze, with normal respiratory effort CV: RRR, no murmur  Abdomen: Soft, non-tender; non-distended, BS present  Extremities: No peripheral edema, warm Skin: Normal turgor and texture; no rash Psych: Appropriate affect Neuro: Alert and oriented to person and place, no focal deficit      Vitals:   08/02/22 0919  BP: 122/82  Pulse: 60  Temp: 98.4 F (36.9 C)  SpO2: 98%  Weight: 147 lb (66.7 kg)  Height: '5\' 5"'$  (1.651 m)     98% on RA BMI Readings from Last 3 Encounters:  08/02/22 24.46 kg/m  03/14/22 24.86  kg/m  12/04/21 23.96 kg/m   Wt Readings from Last 3 Encounters:  08/02/22 147 lb (66.7 kg)  03/14/22 149 lb 6.4 oz (  67.8 kg)  12/04/21 144 lb (65.3 kg)     CBC    Component Value Date/Time   WBC 5.2 02/20/2022 0831   RBC 4.30 02/20/2022 0831   HGB 14.2 02/20/2022 0831   HCT 42.5 02/20/2022 0831   PLT 253 02/20/2022 0831   MCV 98.8 02/20/2022 0831   MCH 33.0 02/20/2022 0831   MCHC 33.4 02/20/2022 0831   RDW 11.6 02/20/2022 0831   LYMPHSABS 1,149 02/20/2022 0831   MONOABS 450 02/11/2017 0959   EOSABS 328 02/20/2022 0831   BASOSABS 68 02/20/2022 0831      Chest Imaging:  CT Chest 07/14/21 with multiple small =< 71m pulmonary nodules, borderline adenopathy  CT Chest 08/29/21 reviewed by me with multiple small nodules, dominant 445mRML, lingula, no suspicious LAD  Pulmonary Functions Testing Results:     No data to display             Assessment & Plan:   # Multiple pulmonary nodules: Low risk for lung CA/metastatic disease.   Plan: - discussed again, no need for further screening for <31m22moncalcified solid nodules in low risk patient per Fleischner 2017 guidelines, grade 2B (weak recommendation, moderate quality evidence). [Radiology. 2017;284(1):228. Epub 2017 Feb 23.] Risks of radiation exposure, invasive procedure for benign lesion which could be complicated. She is undecided though if she wishes to pursue another CT and will consider more at home.  I spent 25 minutes dedicated to the care of this patient on the date of this encounter to include pre-visit review of records, face-to-face time with the patient discussing conditions above including risks and benefits of further surveillance vs no further surveillance, clinical documentation with the electronic health record, and communicating necessary findings to members of the patients care team.   RTC as needed    NatMaryjane HurterD [facility] 08/02/2022 9:49 AM

## 2022-08-01 ENCOUNTER — Ambulatory Visit: Payer: Medicare Other

## 2022-08-02 ENCOUNTER — Encounter: Payer: Self-pay | Admitting: Student

## 2022-08-02 ENCOUNTER — Ambulatory Visit: Payer: Medicare Other | Admitting: Student

## 2022-08-02 VITALS — BP 122/82 | HR 60 | Temp 98.4°F | Ht 65.0 in | Wt 147.0 lb

## 2022-08-02 DIAGNOSIS — R918 Other nonspecific abnormal finding of lung field: Secondary | ICD-10-CM

## 2022-08-02 NOTE — Patient Instructions (Signed)
-   There is perhaps very marginal increase in risk for cancer attributable to 1 ct scan of your chest. 1.6 milliSievert is radiation dose.  - We would ideally repeat around November if you wish to pursue CT Chest but you probably are at low risk for malignancy and technically don't need to repeat this based on our most recent guidelines for lung nodules.  - Let me know what you prefer after some consideration!

## 2022-08-08 ENCOUNTER — Other Ambulatory Visit: Payer: Self-pay | Admitting: Physician Assistant

## 2022-08-08 NOTE — Progress Notes (Signed)
Office Visit Note  Patient: [name]             Date of Birth: 13-Feb-1946           MRN: [id number]             PCP: Cari Caraway, MD Referring: Cari Caraway, MD Visit Date: 08/22/2022 Occupation: @GUAROCC @  Subjective:  Medication management  History of Present Illness: [name] is a 76 y.o. female with history of Sjogren's, osteoarthritis and osteopenia.  She continues to take hydroxychloroquine 1 tablet p.o. 3 times daily.  She gives history of dry mouth and dry eyes.  He denies any history of oral ulcers, nasal ulcers, malar rash, oral Raynaud's phenomenon.  She has not noticed any joint swelling.  She had good response to viscosupplement injections in her knee joints.  She would like to have a repeat series of Visco supplement injections.  She continues to have some lower back pain.  She has an appointment coming up with Dr. Nelva Bush for radiofrequency ablation procedure.  She has been taking calcium and vitamin D.  She is also on Prolia injections.  She has noticed some rash on her lower extremities which she is relating to having dry skin.  Activities of Daily Living:  Patient reports morning stiffness for a few minutes.   Patient Reports nocturnal pain.  Difficulty dressing/grooming: Denies Difficulty climbing stairs: Denies Difficulty getting out of chair: Denies Difficulty using hands for taps, buttons, cutlery, and/or writing: Denies  Review of Systems  Constitutional:  Negative for fatigue.  HENT:  Positive for mouth dryness. Negative for mouth sores.   Eyes:  Positive for dryness.  Respiratory:  Negative for shortness of breath.   Cardiovascular:  Negative for chest pain and palpitations.  Gastrointestinal:  Negative for blood in stool, constipation and diarrhea.  Endocrine: Negative for increased urination.  Genitourinary:  Negative for involuntary urination.  Musculoskeletal:  Positive for myalgias, muscle weakness, morning stiffness and myalgias.  Negative for joint pain, gait problem, joint pain, joint swelling and muscle tenderness.  Skin:  Positive for rash and sensitivity to sunlight. Negative for color change and hair loss.  Allergic/Immunologic: Negative for susceptible to infections.  Neurological:  Negative for dizziness and headaches.  Hematological:  Negative for swollen glands.  Psychiatric/Behavioral:  Negative for depressed mood and sleep disturbance. The patient is not nervous/anxious.     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/20/2009    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   Sjogren's syndrome ([place])    Systemic lupus ([place])  Family History  Problem Relation Age of Onset   Heart disease Mother    Psoriasis Father    Arthritis Father    Heart disease Brother    Heart attack Brother    Diabetes Maternal Grandfather    Cancer Maternal Uncle        colon   Colon cancer Maternal Uncle 80   Asthma Sister    Breast cancer Neg Hx    Past Surgical History:  Procedure Laterality Date   APPENDECTOMY     BUNIONECTOMY  2008   right foot   CATARACT EXTRACTION      COLONOSCOPY  12/05/2016   Dr.Jacobs   COLONOSCOPY W/ POLYPECTOMY  08/27/2011   ENDOMETRIAL ABLATION     FOOT SURGERY Right 12/17/2017   bone spur   ingrown toenail Right 12/16/2018   LAPAROSCOPIC APPENDECTOMY  10/11/2011   Procedure: APPENDECTOMY LAPAROSCOPIC;  Surgeon: Adin Hector, MD;  Location: [facility];  Service: General;  Laterality: N/A;  Partial Cecectomy and Appendectomy   POLYPECTOMY     WISDOM TOOTH EXTRACTION     Social History   Social History Narrative   Not on file   Immunization History  Administered Date(s) Administered   DTaP 06/16/2021   Influenza, High Dose Seasonal PF 07/05/2018, 06/18/2019, 07/11/2022   Influenza,inj,quad, With Preservative 07/10/2017   Influenza-Unspecified 07/23/2020, 06/26/2021, 07/12/2021   PFIZER(Purple Top)SARS-COV-2 Vaccination 11/16/2019, 12/07/2019, 06/14/2020, 05/16/2021   Pfizer Covid-19 Vaccine Bivalent Booster 47yrs & up 07/27/2022   Pneumococcal Conjugate-13 09/08/2014   Pneumococcal Polysaccharide-23 08/30/2011   Tdap 06/20/2011, 06/16/2021   Zoster Recombinat (Shingrix) 02/05/2017, 05/08/2017   Zoster, Live 10/11/2007     Objective: Vital Signs: BP (!) 138/90 (BP Location: Left Arm, Patient Position: Sitting, Cuff Size: Normal)   Pulse (!) 57   Resp 14   Ht 5' 4.75" (1.645 m)   Wt 150 lb (68 kg)   BMI 25.15 kg/m    Physical Exam Vitals and nursing note reviewed.  Constitutional:      Appearance: She is well-developed.  HENT:     Head: Normocephalic and atraumatic.  Eyes:     Conjunctiva/sclera: Conjunctivae normal.  Cardiovascular:     Rate and Rhythm: Normal rate and regular rhythm.     Heart sounds: Normal heart sounds.  Pulmonary:     Effort: Pulmonary effort is normal.     Breath sounds: Normal breath sounds.  Abdominal:     General: Bowel sounds are normal.     Palpations: Abdomen is soft.  Musculoskeletal:     Cervical back: Normal range of motion.  Lymphadenopathy:     Cervical: No cervical  adenopathy.  Skin:    General: Skin is warm and dry.     Capillary Refill: Capillary refill takes less than 2 seconds.  Neurological:     Mental Status: She is alert and oriented to person, place, and time.  Psychiatric:        Behavior: Behavior normal.      Musculoskeletal Exam: Cervical spine was in good range of motion.  She had thoracic kyphosis.  She had discomfort range of motion of the lumbar spine.  Shoulder joints, elbow joints, wrist joints with good range of motion.  There was no synovitis of MCPs.  Bilateral PIP and DIP thickening was noted.  [place] prominence was noted.  Hip joints with good range of motion.  She had good range of motion of bilateral knee joints without any warmth swelling or effusion.  There was no tenderness  over ankles or MTPs.  CDAI Exam: CDAI Score: -- Patient Global: --; Provider Global: -- Swollen: --; Tender: -- Joint Exam 08/22/2022   No joint exam has been documented for this visit   There is currently no information documented on the homunculus. Go to the Rheumatology activity and complete the homunculus joint exam.  Investigation: No additional findings.  Imaging: No results found.  Recent Labs: Lab Results  Component Value Date   WBC 5.3 08/20/2022   HGB 13.6 08/20/2022   PLT 218 08/20/2022   NA 142 08/20/2022   K 4.3 08/20/2022   CL 106 08/20/2022   CO2 26 08/20/2022   GLUCOSE 80 08/20/2022   BUN 13 08/20/2022   CREATININE 0.75 08/20/2022   BILITOT 0.5 08/20/2022   ALKPHOS 48 02/11/2017   AST 19 08/20/2022   ALT 14 08/20/2022   PROT 6.9 08/20/2022   PROT 7.0 08/20/2022   ALBUMIN 3.9 02/11/2017   CALCIUM 9.7 08/20/2022   GFRAA 92 01/18/2021      Speciality Comments: [facility] eye exam: 10/02/2021 Normal. Dr. Rutherford Guys. Follow up in 1 year. (in [place])  Procedures:  No procedures performed Allergies: Boniva [ibandronic acid], Latex, Sulfonamide derivatives, and Bacitracin-polymyxin b   Assessment / Plan:      Visit Diagnoses: Sjogren's syndrome with keratoconjunctivitis sicca (HCC) - ANA+, Ro+, sicca symptoms, Raynaud's, inflammatory arthritis: -Her sicca symptoms are well controlled with over-the-counter products.  She has been also taking hydroxychloroquine 1 tablet p.o. daily which she has been tolerating well.  She has not noticed any recent episodes of joint inflammation.  There is no history of oral ulcers, nasal ulcers, malar rash, photosensitivity or Raynaud's phenomenon.  Plan: Protein / creatinine ratio, urine, C3 and C4, Sedimentation rate, Anti-DNA antibody, double-stranded, Sjogrens syndrome-A extractable nuclear antibody  High risk medication use - Plaquenil 200 mg 1 tablet by mouth daily.  -August 20, 2022 UA showed trace leukocytes, SSA> 8.0, dsDNA negative, complements normal, RF negative, ESR 11, ANA pending, SPEP pending.  Left findings were discussed with the patient.  The labs do not indicate active autoimmune disease.  We will continue current treatment.  Her eye examination was normal on October 02, 2021.  She states she has an eye examination scheduled this year.  We will recheck her labs in 5 months prior to her next visit.  Plan: CBC with Differential/Platelet, COMPLETE METABOLIC PANEL WITH GFR  Rash-excoriation marks on her lower extremities with dry skin was noted.  Use of moisturizing lotion was discussed.  Primary osteoarthritis of both hands-she has been lateral CMC PIP and DIP thickening consistent with osteoarthritis.  Joint protection muscle strengthening was discussed.  Primary osteoarthritis of both knees - s/p synvisc bilateral knees 01/2022-02/2022.  She had good response to Visco supplement injection.  We will have repeat viscosupplement injections.  Trochanteric bursitis of left hip-improved.  She will continue to do stretching exercises.  Osteopenia of multiple sites - DEXA on 05/03/21: The BMD measured at Femur Neck Right is 0.759 g/cm2 with a T-score of -2.0. Prolia  60 mg subcutaneous injections once every 6 months by her PCP.  She is on calcium and vitamin D.  Closed compression fracture of body of L1 vertebra (HCC) - She acquired the L1 compression fracture after a fall on the concrete ground.  She could not get kyphoplasty due to the pathology of the fracture.   Postural thoracic kyphosis-back exercises were emphasized.  DDD lumbar spine-patient is currently followed by Dr. Nelva Bush she is controlled to  have radiofrequency ablation due to ongoing pain.  Vitamin D deficiency-she is on calcium and vitamin D supplement.  Other medical problems are listed as follows:  Other fatigue  History of anxiety  History of depression  Hypothyroidism, adult  Orders: Orders Placed This Encounter  Procedures   Protein / creatinine ratio, urine   CBC with Differential/Platelet   COMPLETE METABOLIC PANEL WITH GFR   C3 and C4   Sedimentation rate   Anti-DNA antibody, double-stranded   Sjogrens syndrome-A extractable nuclear antibody   No orders of the defined types were placed in this encounter.   Follow-Up Instructions: Return in about 5 months (around 01/21/2023) for Sjogren's.   Bo Merino, MD  Note - This record has been created using Editor, commissioning.  Chart creation errors have been sought, but may not always  have been located. Such creation errors do not reflect on  the standard of medical care.

## 2022-08-09 NOTE — Telephone Encounter (Signed)
Next Visit: 08/22/2022  Last Visit: 03/14/2022  Last Fill: 05/16/2022  Dx: History of anxiety  Current Dose per office note on 03/14/2022: She remains on Lexapro as prescribed    Okay to refill Lexparo?

## 2022-08-20 ENCOUNTER — Other Ambulatory Visit: Payer: Self-pay | Admitting: *Deleted

## 2022-08-20 DIAGNOSIS — M3501 Sicca syndrome with keratoconjunctivitis: Secondary | ICD-10-CM

## 2022-08-20 DIAGNOSIS — Z79899 Other long term (current) drug therapy: Secondary | ICD-10-CM

## 2022-08-20 DIAGNOSIS — M359 Systemic involvement of connective tissue, unspecified: Secondary | ICD-10-CM

## 2022-08-22 ENCOUNTER — Ambulatory Visit: Payer: Medicare Other | Attending: Rheumatology | Admitting: Rheumatology

## 2022-08-22 ENCOUNTER — Encounter: Payer: Self-pay | Admitting: Rheumatology

## 2022-08-22 ENCOUNTER — Telehealth: Payer: Self-pay

## 2022-08-22 VITALS — BP 138/90 | HR 57 | Resp 14 | Ht 64.75 in | Wt 150.0 lb

## 2022-08-22 DIAGNOSIS — M170 Bilateral primary osteoarthritis of knee: Secondary | ICD-10-CM | POA: Diagnosis not present

## 2022-08-22 DIAGNOSIS — M5136 Other intervertebral disc degeneration, lumbar region: Secondary | ICD-10-CM

## 2022-08-22 DIAGNOSIS — M8589 Other specified disorders of bone density and structure, multiple sites: Secondary | ICD-10-CM

## 2022-08-22 DIAGNOSIS — M19042 Primary osteoarthritis, left hand: Secondary | ICD-10-CM

## 2022-08-22 DIAGNOSIS — R21 Rash and other nonspecific skin eruption: Secondary | ICD-10-CM

## 2022-08-22 DIAGNOSIS — M19041 Primary osteoarthritis, right hand: Secondary | ICD-10-CM

## 2022-08-22 DIAGNOSIS — Z79899 Other long term (current) drug therapy: Secondary | ICD-10-CM | POA: Diagnosis not present

## 2022-08-22 DIAGNOSIS — Z8659 Personal history of other mental and behavioral disorders: Secondary | ICD-10-CM

## 2022-08-22 DIAGNOSIS — M7062 Trochanteric bursitis, left hip: Secondary | ICD-10-CM

## 2022-08-22 DIAGNOSIS — M4004 Postural kyphosis, thoracic region: Secondary | ICD-10-CM

## 2022-08-22 DIAGNOSIS — M3501 Sicca syndrome with keratoconjunctivitis: Secondary | ICD-10-CM | POA: Diagnosis not present

## 2022-08-22 DIAGNOSIS — E559 Vitamin D deficiency, unspecified: Secondary | ICD-10-CM

## 2022-08-22 DIAGNOSIS — E039 Hypothyroidism, unspecified: Secondary | ICD-10-CM

## 2022-08-22 DIAGNOSIS — R5383 Other fatigue: Secondary | ICD-10-CM

## 2022-08-22 DIAGNOSIS — M359 Systemic involvement of connective tissue, unspecified: Secondary | ICD-10-CM

## 2022-08-22 DIAGNOSIS — S32010A Wedge compression fracture of first lumbar vertebra, initial encounter for closed fracture: Secondary | ICD-10-CM

## 2022-08-22 NOTE — Progress Notes (Signed)
Dr. Estanislado Pandy will discuss lab results at the patients appointment today.

## 2022-08-22 NOTE — Patient Instructions (Signed)
Standing Labs We placed an order today for your standing lab work.   Please have your standing labs drawn in April  Please have your labs drawn 2 weeks prior to your appointment so that the provider can discuss your lab results at your appointment.  Please note that you may see your imaging and lab results in [facility] before we have reviewed them. We will contact you once all results are reviewed. Please allow our office up to 72 hours to thoroughly review all of the results before contacting the office for clarification of your results.  Lab hours are:   Monday through Thursday from 8:00 am -12:30 pm and 1:00 pm-5:00 pm and Friday from 8:00 am-12:00 pm.  Please be advised, all patients with office appointments requiring lab work will take precedent over walk-in lab work.   Labs are drawn by [facility]. Please bring your co-pay at the time of your lab draw.  You may receive a bill from [facility] for your lab work.  Please note if you are on Hydroxychloroquine and and an order has been placed for a Hydroxychloroquine level, you will need to have it drawn 4 hours or more after your last dose.  If you wish to have your labs drawn at another location, please call the office 24 hours in advance so we can fax the orders.  The office is located at [address] [place], [place], [place] [address] No appointment is necessary.    If you have any questions regarding directions or hours of operation,  please call [phone].   As a reminder, please drink plenty of water prior to coming for your lab work. Thanks!   Vaccines You are taking a medication(s) that can suppress your immune system.  The following immunizations are recommended: Flu annually Covid-19  Td/Tdap (tetanus, diphtheria, pertussis) every 10 years Pneumonia (Prevnar 15 then Pneumovax 23 at least 1 year apart.  Alternatively, can take Prevnar 20 without needing additional dose) Shingrix: 2 doses from 4 weeks to 6 months  apart  Please check with your PCP to make sure you are up to date.

## 2022-08-22 NOTE — Telephone Encounter (Signed)
Please apply for bilateral knee visco, per Dr. Deveshwar. Thanks!  

## 2022-08-22 NOTE — Telephone Encounter (Signed)
VOB submitted for Synvisc, bilateral knees BV pending

## 2022-08-23 LAB — COMPLETE METABOLIC PANEL WITH GFR
AG Ratio: 1.6 (calc) (ref 1.0–2.5)
ALT: 14 U/L (ref 6–29)
AST: 19 U/L (ref 10–35)
Albumin: 4.3 g/dL (ref 3.6–5.1)
Alkaline phosphatase (APISO): 41 U/L (ref 37–153)
BUN: 13 mg/dL (ref 7–25)
CO2: 26 mmol/L (ref 20–32)
Calcium: 9.7 mg/dL (ref 8.6–10.4)
Chloride: 106 mmol/L (ref 98–110)
Creat: 0.75 mg/dL (ref 0.60–1.00)
Globulin: 2.7 g/dL (calc) (ref 1.9–3.7)
Glucose, Bld: 80 mg/dL (ref 65–99)
Potassium: 4.3 mmol/L (ref 3.5–5.3)
Sodium: 142 mmol/L (ref 135–146)
Total Bilirubin: 0.5 mg/dL (ref 0.2–1.2)
Total Protein: 7 g/dL (ref 6.1–8.1)
eGFR: 82 mL/min/{1.73_m2} (ref >=60)

## 2022-08-23 LAB — URINALYSIS, ROUTINE W REFLEX MICROSCOPIC
Bacteria, UA: NONE SEEN /HPF
Bilirubin Urine: NEGATIVE
Glucose, UA: NEGATIVE
Hgb urine dipstick: NEGATIVE
Hyaline Cast: NONE SEEN /LPF
Ketones, ur: NEGATIVE
Nitrite: NEGATIVE
Protein, ur: NEGATIVE
RBC / HPF: NONE SEEN /HPF (ref 0–2)
Specific Gravity, Urine: 1.004 (ref 1.001–1.035)
Squamous Epithelial / HPF: NONE SEEN /HPF (ref <=5)
WBC, UA: NONE SEEN /HPF (ref 0–5)
pH: 6.5 (ref 5.0–8.0)

## 2022-08-23 LAB — PROTEIN ELECTROPHORESIS, SERUM, WITH REFLEX
Albumin ELP: 4.1 g/dL (ref 3.8–4.8)
Alpha 1: 0.3 g/dL (ref 0.2–0.3)
Alpha 2: 0.7 g/dL (ref 0.5–0.9)
Beta 2: 0.4 g/dL (ref 0.2–0.5)
Beta Globulin: 0.4 g/dL (ref 0.4–0.6)
Gamma Globulin: 1.1 g/dL (ref 0.8–1.7)
Total Protein: 6.9 g/dL (ref 6.1–8.1)

## 2022-08-23 LAB — ANTI-NUCLEAR AB-TITER (ANA TITER): ANA Titer 1: 1:80 {titer} — ABNORMAL HIGH

## 2022-08-23 LAB — CBC WITH DIFFERENTIAL/PLATELET
Absolute Monocytes: 398 cells/uL (ref 200–950)
Basophils Absolute: 48 cells/uL (ref 0–200)
Basophils Relative: 0.9 %
Eosinophils Absolute: 249 cells/uL (ref 15–500)
Eosinophils Relative: 4.7 %
HCT: 39.6 % (ref 35.0–45.0)
Hemoglobin: 13.6 g/dL (ref 11.7–15.5)
Lymphs Abs: 1097 cells/uL (ref 850–3900)
MCH: 33.7 pg — ABNORMAL HIGH (ref 27.0–33.0)
MCHC: 34.3 g/dL (ref 32.0–36.0)
MCV: 98 fL (ref 80.0–100.0)
MPV: 10.8 fL (ref 7.5–12.5)
Monocytes Relative: 7.5 %
Neutro Abs: 3509 cells/uL (ref 1500–7800)
Neutrophils Relative %: 66.2 %
Platelets: 218 10*3/uL (ref 140–400)
RBC: 4.04 10*6/uL (ref 3.80–5.10)
RDW: 11.6 % (ref 11.0–15.0)
Total Lymphocyte: 20.7 %
WBC: 5.3 10*3/uL (ref 3.8–10.8)

## 2022-08-23 LAB — SEDIMENTATION RATE: Sed Rate: 11 mm/h (ref 0–30)

## 2022-08-23 LAB — C3 AND C4
C3 Complement: 120 mg/dL (ref 83–193)
C4 Complement: 23 mg/dL (ref 15–57)

## 2022-08-23 LAB — MICROSCOPIC MESSAGE

## 2022-08-23 LAB — ANA: Anti Nuclear Antibody (ANA): POSITIVE — AB

## 2022-08-23 LAB — RHEUMATOID FACTOR: Rheumatoid fact SerPl-aCnc: <14 IU/mL (ref <14)

## 2022-08-23 LAB — ANTI-DNA ANTIBODY, DOUBLE-STRANDED: ds DNA Ab: 1 IU/mL

## 2022-08-23 LAB — SJOGRENS SYNDROME-A EXTRACTABLE NUCLEAR ANTIBODY: SSA (Ro) (ENA) Antibody, IgG: >8 AI — AB

## 2022-08-23 NOTE — Progress Notes (Signed)
SPEP did not reveal any abnormal proteins.

## 2022-08-24 ENCOUNTER — Other Ambulatory Visit: Payer: Self-pay | Admitting: Rheumatology

## 2022-08-24 ENCOUNTER — Other Ambulatory Visit: Payer: Self-pay | Admitting: Physician Assistant

## 2022-08-24 DIAGNOSIS — M359 Systemic involvement of connective tissue, unspecified: Secondary | ICD-10-CM

## 2022-08-24 DIAGNOSIS — M3501 Sicca syndrome with keratoconjunctivitis: Secondary | ICD-10-CM

## 2022-08-24 DIAGNOSIS — Z79899 Other long term (current) drug therapy: Secondary | ICD-10-CM

## 2022-08-24 NOTE — Telephone Encounter (Signed)
Please call to schedule visco injections.  Approved for Synvisc, bilateral knee(s). Buy & Bill Covered at 100% of the injection cost and 95% of the medication costs. $30 co-pay Deductibles do not apply No pre-certifications

## 2022-08-30 ENCOUNTER — Ambulatory Visit
Admission: RE | Admit: 2022-08-30 | Discharge: 2022-08-30 | Disposition: A | Discharge status: Home / Self Care | Payer: Medicare Other | Source: Ambulatory Visit | Attending: Family Medicine | Admitting: Family Medicine

## 2022-08-30 DIAGNOSIS — Z1231 Encounter for screening mammogram for malignant neoplasm of breast: Secondary | ICD-10-CM

## 2022-09-03 ENCOUNTER — Other Ambulatory Visit: Payer: Self-pay | Admitting: Family Medicine

## 2022-09-03 DIAGNOSIS — R928 Other abnormal and inconclusive findings on diagnostic imaging of breast: Secondary | ICD-10-CM

## 2022-09-17 ENCOUNTER — Other Ambulatory Visit: Payer: Self-pay | Admitting: Rheumatology

## 2022-09-17 DIAGNOSIS — M3501 Sicca syndrome with keratoconjunctivitis: Secondary | ICD-10-CM

## 2022-09-17 DIAGNOSIS — Z79899 Other long term (current) drug therapy: Secondary | ICD-10-CM

## 2022-09-17 DIAGNOSIS — M359 Systemic involvement of connective tissue, unspecified: Secondary | ICD-10-CM

## 2022-09-18 ENCOUNTER — Encounter: Payer: Self-pay | Admitting: *Deleted

## 2022-09-18 NOTE — Telephone Encounter (Signed)
Sent message via my chart to remind patient to update PL[facility] eye exam.

## 2022-09-18 NOTE — Telephone Encounter (Signed)
Please remind the patient about the eye examination.

## 2022-09-18 NOTE — Telephone Encounter (Signed)
Next Visit: 01/22/2023  Last Visit: 08/22/2022  Labs: 08/20/2022 MCH 33.7  Eye exam: 10/02/2021 Normal.    Current Dose per office note 08/22/2022: Plaquenil 200 mg 1 tablet by mouth daily   [id number] syndrome with keratoconjunctivitis sicca   Last Fill: 07/11/2022  Okay to refill Plaquenil?

## 2022-09-19 ENCOUNTER — Ambulatory Visit
Admission: RE | Admit: 2022-09-19 | Discharge: 2022-09-19 | Disposition: A | Discharge status: Home / Self Care | Payer: Medicare Other | Source: Ambulatory Visit | Attending: Family Medicine | Admitting: Family Medicine

## 2022-09-19 ENCOUNTER — Ambulatory Visit: Payer: Medicare Other

## 2022-09-19 DIAGNOSIS — R928 Other abnormal and inconclusive findings on diagnostic imaging of breast: Secondary | ICD-10-CM

## 2022-09-20 ENCOUNTER — Encounter: Payer: Self-pay | Admitting: Rheumatology

## 2022-09-21 ENCOUNTER — Encounter: Payer: Self-pay | Admitting: *Deleted

## 2022-10-02 NOTE — Telephone Encounter (Signed)
Ok to keep scheduled appointment if the patient can wear a mask (especially while in waiting room).

## 2022-10-03 ENCOUNTER — Ambulatory Visit: Payer: Medicare Other | Attending: Physician Assistant | Admitting: Physician Assistant

## 2022-10-03 DIAGNOSIS — M170 Bilateral primary osteoarthritis of knee: Secondary | ICD-10-CM | POA: Diagnosis not present

## 2022-10-03 DIAGNOSIS — M25562 Pain in left knee: Secondary | ICD-10-CM

## 2022-10-03 DIAGNOSIS — M25561 Pain in right knee: Secondary | ICD-10-CM

## 2022-10-03 MED ORDER — LIDOCAINE HCL 1 % IJ SOLN
1.5000 mL | INTRAMUSCULAR | Status: AC | PRN
  Administered 2022-10-03: 1.5 mL

## 2022-10-03 MED ORDER — HYLAN G-F 20 16 MG/2ML IX SOSY
16.0000 mg | PREFILLED_SYRINGE | INTRA_ARTICULAR | Status: AC | PRN
  Administered 2022-10-03: 16 mg via INTRA_ARTICULAR

## 2022-10-03 NOTE — Progress Notes (Signed)
   Procedure Note  Patient: [name]             Date of Birth: 02-28-1946           MRN: [id number]             Visit Date: 10/03/2022  Procedures: Visit Diagnoses:  1. Primary osteoarthritis of both knees    Synvisc #1 bilateral knees, B/B Large Joint Inj: bilateral knee on 10/03/2022 2:33 PM Indications: pain Details: 25 G 1.5 in needle, medial approach  Arthrogram: No  Medications (Right): 1.5 mL lidocaine 1 %; 16 mg hylan 16 MG/2ML Aspirate (Right): 0 mL Medications (Left): 1.5 mL lidocaine 1 %; 16 mg hylan 16 MG/2ML Aspirate (Left): 0 mL Outcome: tolerated well, no immediate complications Procedure, treatment alternatives, risks and benefits explained, specific risks discussed. Consent was given by the patient. Immediately prior to procedure a time out was called to verify the correct patient, procedure, equipment, support staff and site/side marked as required. Patient was prepped and draped in the usual sterile fashion.    Patient tolerated the procedure well.  Aftercare was discussed.  Hazel Sams, PA-C

## 2022-10-10 ENCOUNTER — Ambulatory Visit: Payer: Medicare Other | Attending: Physician Assistant | Admitting: Physician Assistant

## 2022-10-10 ENCOUNTER — Other Ambulatory Visit: Payer: Self-pay | Admitting: Physician Assistant

## 2022-10-10 DIAGNOSIS — M170 Bilateral primary osteoarthritis of knee: Secondary | ICD-10-CM

## 2022-10-10 MED ORDER — HYLAN G-F 20 16 MG/2ML IX SOSY
16.0000 mg | PREFILLED_SYRINGE | INTRA_ARTICULAR | Status: AC | PRN
  Administered 2022-10-10: 16 mg via INTRA_ARTICULAR

## 2022-10-10 MED ORDER — LIDOCAINE HCL 1 % IJ SOLN
1.5000 mL | INTRAMUSCULAR | Status: AC | PRN
  Administered 2022-10-10: 1.5 mL

## 2022-10-10 NOTE — Progress Notes (Signed)
   Procedure Note  Patient: [name]             Date of Birth: 1945/11/16           MRN: [id number]             Visit Date: 10/10/2022  Procedures: Visit Diagnoses:  1. Primary osteoarthritis of both knees    Synvisc #2 bilateral knees, B/B Large Joint Inj: bilateral knee on 10/10/2022 1:07 PM Indications: pain Details: 25 G 1.5 in needle, medial approach  Arthrogram: No  Medications (Right): 1.5 mL lidocaine 1 %; 16 mg hylan 16 MG/2ML Aspirate (Right): 0 mL Medications (Left): 1.5 mL lidocaine 1 %; 16 mg hylan 16 MG/2ML Aspirate (Left): 0 mL Outcome: tolerated well, no immediate complications Procedure, treatment alternatives, risks and benefits explained, specific risks discussed. Consent was given by the patient. Immediately prior to procedure a time out was called to verify the correct patient, procedure, equipment, support staff and site/side marked as required. Patient was prepped and draped in the usual sterile fashion.     Patient tolerated the procedure well.  Aftercare was discussed.  Hazel Sams, PA-C

## 2022-10-11 NOTE — Telephone Encounter (Signed)
Next Visit: 01/21/2022  Last Visit: 08/22/2022  Last Fill: 08/09/2022  Dx: History of depression   Current Dose per office note on 08/22/2022: not discussed  Okay to refill Lexapro?

## 2022-10-17 ENCOUNTER — Ambulatory Visit: Payer: Medicare Other | Attending: Physician Assistant | Admitting: Physician Assistant

## 2022-10-17 DIAGNOSIS — M170 Bilateral primary osteoarthritis of knee: Secondary | ICD-10-CM

## 2022-10-17 MED ORDER — HYLAN G-F 20 16 MG/2ML IX SOSY
16.0000 mg | PREFILLED_SYRINGE | INTRA_ARTICULAR | Status: AC | PRN
  Administered 2022-10-17: 16 mg via INTRA_ARTICULAR

## 2022-10-17 MED ORDER — LIDOCAINE HCL 1 % IJ SOLN
1.5000 mL | INTRAMUSCULAR | Status: AC | PRN
  Administered 2022-10-17: 1.5 mL

## 2022-10-17 NOTE — Progress Notes (Signed)
   Procedure Note  Patient: [name]             Date of Birth: 03-18-1946           MRN: [id number]             Visit Date: 10/17/2022  Procedures: Visit Diagnoses:  1. Primary osteoarthritis of both knees    Synvisc #3 bilateral knee joint injections  Large Joint Inj: bilateral knee on 10/17/2022 2:05 PM Indications: pain Details: 27 G 1.5 in needle, medial approach  Arthrogram: No  Medications (Right): 1.5 mL lidocaine 1 %; 16 mg hylan 16 MG/2ML Aspirate (Right): 0 mL Medications (Left): 1.5 mL lidocaine 1 %; 16 mg hylan 16 MG/2ML Aspirate (Left): 0 mL Outcome: tolerated well, no immediate complications Procedure, treatment alternatives, risks and benefits explained, specific risks discussed. Consent was given by the patient. Immediately prior to procedure a time out was called to verify the correct patient, procedure, equipment, support staff and site/side marked as required. Patient was prepped and draped in the usual sterile fashion.      Patient tolerated the procedure well.  Aftercare was discussed.  Hazel Sams, PA-C

## 2022-11-19 ENCOUNTER — Other Ambulatory Visit: Payer: Self-pay | Admitting: Rheumatology

## 2022-11-19 DIAGNOSIS — Z79899 Other long term (current) drug therapy: Secondary | ICD-10-CM

## 2022-11-19 DIAGNOSIS — M359 Systemic involvement of connective tissue, unspecified: Secondary | ICD-10-CM

## 2022-11-19 DIAGNOSIS — M3501 Sicca syndrome with keratoconjunctivitis: Secondary | ICD-10-CM

## 2022-11-19 NOTE — Telephone Encounter (Signed)
Next Visit: 01/22/2023  Last Visit: 08/22/2022  Labs: 08/20/2022  Eye exam: 10/29/2022   Current Dose per office note on 08/22/2022: Plaquenil 200 mg 1 tablet by mouth daily.   WU:[id number] syndrome with keratoconjunctivitis sicca   Last Fill: 09/18/2022  Okay to refill Plaquenil?

## 2022-12-12 ENCOUNTER — Encounter (HOSPITAL_BASED_OUTPATIENT_CLINIC_OR_DEPARTMENT_OTHER): Payer: Self-pay | Admitting: Physical Therapy

## 2022-12-18 ENCOUNTER — Ambulatory Visit (HOSPITAL_BASED_OUTPATIENT_CLINIC_OR_DEPARTMENT_OTHER): Payer: Medicare Other | Attending: Family Medicine | Admitting: Physical Therapy

## 2022-12-18 ENCOUNTER — Encounter (HOSPITAL_BASED_OUTPATIENT_CLINIC_OR_DEPARTMENT_OTHER): Payer: Self-pay | Admitting: Physical Therapy

## 2022-12-18 ENCOUNTER — Other Ambulatory Visit: Payer: Self-pay

## 2022-12-18 DIAGNOSIS — M25562 Pain in left knee: Secondary | ICD-10-CM | POA: Diagnosis present

## 2022-12-18 DIAGNOSIS — R2689 Other abnormalities of gait and mobility: Secondary | ICD-10-CM | POA: Insufficient documentation

## 2022-12-18 DIAGNOSIS — M25561 Pain in right knee: Secondary | ICD-10-CM | POA: Insufficient documentation

## 2022-12-18 DIAGNOSIS — G8929 Other chronic pain: Secondary | ICD-10-CM | POA: Diagnosis present

## 2022-12-18 NOTE — Therapy (Signed)
OUTPATIENT PHYSICAL THERAPY LOWER EXTREMITY EVALUATION   Patient Name: [name] MRN: [id number] DOB:07-11-1946, 77 y.o., female Today's Date: 12/18/2022  END OF SESSION:  PT End of Session - 12/18/22 0927     Visit Number 1    Number of Visits 16    Date for PT Re-Evaluation 02/12/23    PT Start Time 0920    PT Stop Time 1007    PT Time Calculation (min) 47 min    Activity Tolerance Patient tolerated treatment well    Behavior During Therapy [facility] for tasks assessed/performed             Past Medical History:  Diagnosis Date   Allergy    Atherosclerosis    Colon polyp    Compression fracture of L1 lumbar vertebra ([place])    dx by ortho   Environmental allergies    Hypothyroid    Osteoarthritis    Osteoarthritis    Osteoarthritis    Osteopenia    Plantar fasciitis, right    Raynauds phenomenon    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   Sjogren's syndrome ([place])    Systemic lupus ([place])    Past Surgical History:  Procedure Laterality Date   APPENDECTOMY     BUNIONECTOMY  2008   right foot   CATARACT EXTRACTION     COLONOSCOPY  12/05/2016   Dr.Jacobs   COLONOSCOPY W/ POLYPECTOMY  08/27/2011   ENDOMETRIAL ABLATION     FOOT SURGERY Right 12/17/2017   bone spur   ingrown toenail Right 12/16/2018   LAPAROSCOPIC APPENDECTOMY  10/11/2011   Procedure: APPENDECTOMY LAPAROSCOPIC;  Surgeon: Adin Hector, MD;  Location: [facility];  Service: General;  Laterality: N/A;  Partial Cecectomy and Appendectomy   POLYPECTOMY     WISDOM TOOTH EXTRACTION     Patient Active Problem List   Diagnosis Date Noted   Encounter for counseling 09/09/2018   Osteopenia of multiple sites 08/22/2017   History of vitamin D deficiency 08/22/2017   Autoimmune disease ([place]) 10/23/2016   Sjogren's syndrome ([place]) 10/09/2016   Primary osteoarthritis of both hands 10/09/2016   Primary osteoarthritis of both knees 10/09/2016   High risk medication use 09/12/2016   SBO (small  bowel obstruction), partial postop 10/16/2011   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 She has had increased pain over the past few months. She has had gel shots in the past which are no longer helping. She had a fall in February. Since that point she has used a cane for primary mobility.   PERTINENT HISTORY: The patient has a long history of knee OA. She feels like her right is worse then the left.  PAIN:  Are you having pain? Yes: NPRS scale: 2-3/10 Pain location: lateral inferior portion of the knee  Pain description: aching  Aggravating factors: Standing walking bending knees Relieving factors: Rest  Yes: NPRS scale: 1/10 Pain location: left knee  Pain description: Aching Aggravating factors: Standing walking bending  Relieving factors: Rest  PRECAUTIONS: Fall  WEIGHT BEARING RESTRICTIONS: No  FALLS:  Has patient fallen in last 6 months? Yes February 6th the patient fell coming out of a restaurant   LIVING ENVIRONMENT: Steps from the garage into the kitchen and a full basement with 13 steps   OCCUPATION: retired    PLOF: Independent was not using a cane prior to the fall.   PATIENT GOALS: to be prepared to potential surgical procedure; to be more steady.   NEXT MD VISIT: March 14th   OBJECTIVE:   DIAGNOSTIC FINDINGS: X-RAYS SHOW BILATERAL OA   PATIENT SURVEYS:  FOTO    COGNITION: Overall cognitive status: Within functional limits for tasks  assessed     SENSATION: WFL  EDEMA:    MUSCLE LENGTH:  POSTURE: No Significant postural limitations  PALPATION:   LOWER EXTREMITY ROM:  Passive ROM Right eval Left eval  Hip flexion    Hip extension    Hip abduction    Hip adduction    Hip internal rotation    Hip external rotation    Knee flexion Full Full  Knee extension -5 -3  Ankle dorsiflexion    Ankle plantarflexion    Ankle inversion    Ankle eversion     (Blank rows = not tested)  LOWER EXTREMITY MMT:  MMT Right eval Left eval  Hip flexion 19.8 18.8  Hip extension    Hip abduction 23.1 21.1  Hip adduction    Hip internal rotation    Hip external rotation    Knee flexion    Knee extension 29.2 33  Ankle dorsiflexion    Ankle plantarflexion    Ankle inversion    Ankle eversion     (Blank rows = not tested)    FUNCTIONAL TESTS:    GAIT: Ambulating with cane on the right side initially.  Reviewed how to use a cane on the right left side to take pressure off the right side.  She did well with min cueing.  Decree single-leg stance time on the right compared to left  TODAY'S TREATMENT:                                                                                                                              DATE:   Exercises - Supine Quad Set  - 3 x daily - 7 x weekly - 3 sets - 10 reps - Small Range Straight Leg Raise  - 1 x daily - 7 x weekly - 3 sets - 10 reps - Supine Bridge  - 1 x daily - 7 x weekly - 3 sets - 10 reps - Hooklying Clamshell with Resistance  - 1 x daily - 7 x weekly - 3 sets - 10 reps   PATIENT EDUCATION:  Education details: HEP, symptom management  Person educated: Patient Education method: Explanation, Demonstration, Tactile cues, Verbal cues, and Handouts Education comprehension: verbalized understanding, returned demonstration, verbal cues required, tactile  cues required, and needs further education  HOME EXERCISE PROGRAM: Access Code: [email or web address] URL:  [email or web address] Date: 12/18/2022 Prepared by: Carolyne Littles  ASSESSMENT:  CLINICAL IMPRESSION: Patient is a 77 year old female with bilateral knee pain right greater than left.  She has a long history of bilateral knee pain.  She has reached the point where the most likely outcome is a right total knee replacement.  She would like to work before that to improve her strength and hopefully decrease pain on both sides prior to her surgery.  She has full knee flexion bilateral but is lacking terminal knee extension on both sides.  She has decreased bilateral lower extremity strength in all major muscle groups.  She has been seen for her lower back before and had success in the pool.  She would benefit from skilled therapy on land and in the water to improve strength and improve functional mobility prior to her total knee replacement.  OBJECTIVE IMPAIRMENTS: Abnormal gait, decreased activity tolerance, decreased endurance, decreased mobility, difficulty walking, decreased ROM, decreased strength, and pain.   ACTIVITY LIMITATIONS: carrying, lifting, sitting, standing, stairs, transfers, and locomotion level  PARTICIPATION LIMITATIONS: meal prep, cleaning, laundry, driving, shopping, and yard work  PERSONAL FACTORS: 1-2 comorbidities: old thoracic compression fracture; lumbar spine pain   are also affecting patient's functional outcome.   REHAB POTENTIAL: good   CLINICAL DECISION MAKING: Evolving/moderate complexity bilateral knee pain increasing with time   EVALUATION COMPLEXITY: Moderate   GOALS: Goals reviewed with patient? Yes  SHORT TERM GOALS: Target date: 01/15/2023   Patient will be independent with basic exercise program Baseline: Goal status: INITIAL  2.  Patient will increase bilateral knee extension by 2 degrees Baseline:  Goal status: INITIAL  3.  Patient will increase gross bilateral lower extremity strength by 5 pounds Baseline:  Goal status:  INITIAL   LONG TERM GOALS: Target date: 02/12/2023    Patient will stand for 1 hour without increased bilateral knee pain Baseline:  Goal status: INITIAL  2.  Patient will walk community distances without increased bilateral knee pain Baseline:  Goal status: INITIAL  3.  Patient will be independent with complete HEP for presurgical strengthening to optimize surgical outcome Baseline:  Goal status: INITIAL   PLAN:  PT FREQUENCY: 2x/week 1 time in pool 1 time a length  PT DURATION: 8 weeks  PLANNED INTERVENTIONS: Therapeutic exercises, Therapeutic activity, Neuromuscular re-education, Gait training, Patient/Family education, Self Care, Joint mobilization, Stair training, DME instructions, Aquatic Therapy, Dry Needling, Electrical stimulation, Cryotherapy, Moist heat, Taping, Ultrasound, and Manual therapy  PLAN FOR NEXT SESSION: Consider manual therapy to see if there is potential improved bilateral terminal knee extension.  Expand exercise programs acute close weightbearing exercises next visit consider standing hip 3-way,  consider standing march, consider standing heel raise.  Progressed to gym exercises if tolerated.   Carney Living, PT 12/18/2022, 12:59 PM

## 2022-12-19 ENCOUNTER — Other Ambulatory Visit: Payer: Self-pay | Admitting: Physician Assistant

## 2022-12-19 NOTE — Telephone Encounter (Signed)
Next Visit: 01/22/2023  Last Visit: 08/22/2022  Last Fill: 10/11/2022  Dx: History of depression    Current Dose per office note on 08/22/2022: not discussed  Okay to refill Lexapro?

## 2022-12-28 ENCOUNTER — Encounter (HOSPITAL_BASED_OUTPATIENT_CLINIC_OR_DEPARTMENT_OTHER): Payer: Self-pay | Admitting: Physical Therapy

## 2022-12-28 ENCOUNTER — Ambulatory Visit (HOSPITAL_BASED_OUTPATIENT_CLINIC_OR_DEPARTMENT_OTHER): Payer: Medicare Other | Attending: Family Medicine | Admitting: Physical Therapy

## 2022-12-28 DIAGNOSIS — R2689 Other abnormalities of gait and mobility: Secondary | ICD-10-CM | POA: Diagnosis present

## 2022-12-28 DIAGNOSIS — M25562 Pain in left knee: Secondary | ICD-10-CM | POA: Insufficient documentation

## 2022-12-28 DIAGNOSIS — M25561 Pain in right knee: Secondary | ICD-10-CM | POA: Insufficient documentation

## 2022-12-28 DIAGNOSIS — G8929 Other chronic pain: Secondary | ICD-10-CM

## 2022-12-28 NOTE — Therapy (Signed)
OUTPATIENT PHYSICAL THERAPY LOWER EXTREMITY TREATMENT   Patient Name: [name] MRN: [id number] DOB:1946-07-13, 77 y.o., female Today's Date: 12/28/2022  END OF SESSION:  PT End of Session - 12/28/22 1310     Visit Number 2    Number of Visits 16    Date for PT Re-Evaluation 02/12/23    PT Start Time 1303    PT Stop Time [id number]    PT Time Calculation (min) 42 min    Activity Tolerance Patient tolerated treatment well    Behavior During Therapy WFL for tasks assessed/performed             Past Medical History:  Diagnosis Date   Allergy    Atherosclerosis    Colon polyp    Compression fracture of L1 lumbar vertebra ([place])    dx by ortho   Environmental allergies    Hypothyroid    Osteoarthritis    Osteoarthritis    Osteoarthritis    Osteopenia    Plantar fasciitis, right    Raynauds phenomenon    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   Sjogren's syndrome ([place])    Systemic lupus ([place])    Past Surgical History:  Procedure Laterality Date   APPENDECTOMY     BUNIONECTOMY  2008   right foot   CATARACT EXTRACTION     COLONOSCOPY  12/05/2016   Dr.Jacobs   COLONOSCOPY W/ POLYPECTOMY  08/27/2011   ENDOMETRIAL ABLATION     FOOT SURGERY Right 12/17/2017   bone spur   ingrown toenail Right 12/16/2018   LAPAROSCOPIC APPENDECTOMY  10/11/2011   Procedure: APPENDECTOMY LAPAROSCOPIC;  Surgeon: Adin Hector, MD;  Location: [facility];  Service: General;  Laterality: N/A;  Partial Cecectomy and Appendectomy   POLYPECTOMY     WISDOM TOOTH EXTRACTION     Patient Active Problem List   Diagnosis Date Noted   Encounter for counseling 09/09/2018   Osteopenia of multiple sites 08/22/2017   History of vitamin D deficiency 08/22/2017   Autoimmune disease ([place]) 10/23/2016   Sjogren's syndrome ([place]) 10/09/2016   Primary osteoarthritis of both hands 10/09/2016   Primary osteoarthritis of both knees 10/09/2016   High risk medication use 09/12/2016   SBO (small  bowel obstruction), partial postop 10/16/2011   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   PERTINENT HISTORY: The patient has a long history of knee OA. She feels like her right is worse then the left.  PAIN:  Are you having pain? Yes: NPRS scale: 3/10 Pain location: bilat knee   Pain description: aching  Aggravating factors: Standing walking bending knees Relieving factors: Rest   PRECAUTIONS: Fall  WEIGHT BEARING RESTRICTIONS: No  FALLS:  Has patient fallen in last 6 months? Yes February 6th the patient fell coming out of a restaurant   LIVING ENVIRONMENT: Steps from the garage into the kitchen and a full basement with 13 steps   OCCUPATION: retired    PLOF: Independent was not using a cane prior to the  fall.   PATIENT GOALS: to be prepared to potential surgical procedure; to be more steady.   NEXT MD VISIT: March 14th , Dr. Wynelle Link  OBJECTIVE:   DIAGNOSTIC FINDINGS: X-RAYS SHOW BILATERAL OA   PATIENT SURVEYS:  FOTO    COGNITION: Overall cognitive status: Within functional limits for tasks assessed     SENSATION: WFL  EDEMA:    MUSCLE LENGTH:  POSTURE: No Significant postural limitations  PALPATION:   LOWER EXTREMITY ROM:  Passive ROM Right eval Left eval  Hip flexion    Hip extension    Hip abduction    Hip adduction    Hip internal rotation    Hip external rotation    Knee flexion Full Full  Knee extension -5 -3  Ankle dorsiflexion    Ankle  plantarflexion    Ankle inversion    Ankle eversion     (Blank rows = not tested)  LOWER EXTREMITY MMT:  MMT Right eval Left eval  Hip flexion 19.8 18.8  Hip extension    Hip abduction 23.1 21.1  Hip adduction    Hip internal rotation    Hip external rotation    Knee flexion    Knee extension 29.2 33  Ankle dorsiflexion    Ankle plantarflexion    Ankle inversion    Ankle eversion     (Blank rows = not tested)    FUNCTIONAL TESTS:    GAIT: Ambulating with cane on the right side initially.  Reviewed how to use a cane on the right left side to take pressure off the right side.  She did well with min cueing.  Decree single-leg stance time on the right compared to left  TODAY'S TREATMENT:                                                                                                                              DATE: 3/8 Pt seen for aquatic therapy today.  Treatment took place in water 3.25-4.5 ft in depth at the [place]. Temp of water was 91.  Pt entered/exited the pool via stairs independently with bilat rail. * without support: walking forward/ backward with cues to allow knee flexion during swing through * side stepping with straight knees and then with increased step height * SLS - with light UE support to no support, up to 10s each LE * holding wall:  heel toe raises; hip abdct/ addct x 10 each; hip ext to toe touch x 10 each  * marching forward/backward * holding yellow noodle:  3 way toe tap x 6 each LE * seated on bench:  flutter kick, cycling, and long sitting hip abdct/ add  * STS with feet on blue step in water x 10, with cues for forward arm reach  * at stairs: quad stretch with foot on 2nd step,  hamstring stretch with DF, fot on 2nd step-- 15s x 2 each LE  Pt requires the buoyancy  and hydrostatic pressure of water for support, and to offload joints by unweighting joint load by at least 50 % in navel deep water and by at least 75-80% in chest  to neck deep water.  Viscosity of the water is needed for resistance of strengthening. Water current perturbations provides challenge to standing balance requiring increased core activation.    PATIENT EDUCATION:  Education details: HEP, symptom management  Person educated: Patient Education method: Explanation, Demonstration, Tactile cues, Verbal cues, and Handouts Education comprehension: verbalized understanding, returned demonstration, verbal cues required, tactile cues required, and needs further education  HOME EXERCISE PROGRAM: Access Code: [email or web address] URL: [email or web address] Date: 12/18/2022 Prepared by: Carolyne Littles  ASSESSMENT:  CLINICAL IMPRESSION: Pt confident in aquatic setting and able to take direction from therapist on deck.  She demonstrated decreased balance in both SLS of L and R LE.  She reported slight increase in Rt knee with side stepping Rt; calmed down with change in exercise.  Rt quad reported as tighter than Lt with quad stretch. Overall tolerated session well.  Goals are ongoing.     FROM EVAL: Patient is a 77 year old female with bilateral knee pain right greater than left.  She has a long history of bilateral knee pain.  She has reached the point where the most likely outcome is a right total knee replacement.  She would like to work before that to improve her strength and hopefully decrease pain on both sides prior to her surgery.  She has full knee flexion bilateral but is lacking terminal knee extension on both sides.  She has decreased bilateral lower extremity strength in all major muscle groups.  She has been seen for her lower back before and had success in the pool.  She would benefit from skilled therapy on land and in the water to improve strength and improve functional mobility prior to her total knee replacement.  OBJECTIVE IMPAIRMENTS: Abnormal gait, decreased activity tolerance, decreased endurance, decreased mobility, difficulty  walking, decreased ROM, decreased strength, and pain.   ACTIVITY LIMITATIONS: carrying, lifting, sitting, standing, stairs, transfers, and locomotion level  PARTICIPATION LIMITATIONS: meal prep, cleaning, laundry, driving, shopping, and yard work  PERSONAL FACTORS: 1-2 comorbidities: old thoracic compression fracture; lumbar spine pain   are also affecting patient's functional outcome.   REHAB POTENTIAL: good   CLINICAL DECISION MAKING: Evolving/moderate complexity bilateral knee pain increasing with time   EVALUATION COMPLEXITY: Moderate   GOALS: Goals reviewed with patient? Yes  SHORT TERM GOALS: Target date: 01/15/2023   Patient will be independent with basic exercise program Baseline: Goal status: INITIAL  2.  Patient will increase bilateral knee extension by 2 degrees Baseline:  Goal status: INITIAL  3.  Patient will increase gross bilateral lower extremity strength by 5 pounds Baseline:  Goal status: INITIAL   LONG TERM GOALS: Target date: 02/12/2023    Patient will stand for 1 hour without increased bilateral knee pain Baseline:  Goal status: INITIAL  2.  Patient will walk community distances without increased bilateral knee pain Baseline:  Goal status: INITIAL  3.  Patient will be independent with complete HEP for presurgical strengthening to optimize surgical outcome Baseline:  Goal status: INITIAL   PLAN:  PT FREQUENCY: 2x/week 1 time in pool 1 time a length  PT DURATION: 8 weeks  PLANNED INTERVENTIONS: Therapeutic exercises, Therapeutic activity, Neuromuscular re-education, Gait training, Patient/Family education, Self Care, Joint mobilization, Stair training, DME instructions, Aquatic Therapy, Dry Needling, Electrical stimulation, Cryotherapy, Moist heat, Taping, Ultrasound, and Manual therapy  PLAN FOR NEXT SESSION: Consider manual therapy to see if there is potential improved bilateral terminal knee extension.  Expand exercise programs acute close  weightbearing exercises next visit consider standing hip 3-way,  consider standing march, consider standing heel raise.  Progressed to gym exercises if tolerated.   Kerin Perna, PTA 12/28/22 1:55 PM [facility] [address] [place], [place], [address] Phone: [phone]   Fax:  [phone]

## 2023-01-01 ENCOUNTER — Ambulatory Visit (HOSPITAL_BASED_OUTPATIENT_CLINIC_OR_DEPARTMENT_OTHER): Payer: Medicare Other | Admitting: Physical Therapy

## 2023-01-01 ENCOUNTER — Encounter (HOSPITAL_BASED_OUTPATIENT_CLINIC_OR_DEPARTMENT_OTHER): Payer: Self-pay | Admitting: Physical Therapy

## 2023-01-01 ENCOUNTER — Encounter (HOSPITAL_BASED_OUTPATIENT_CLINIC_OR_DEPARTMENT_OTHER): Payer: Medicare Other | Admitting: Physical Therapy

## 2023-01-01 DIAGNOSIS — R2689 Other abnormalities of gait and mobility: Secondary | ICD-10-CM

## 2023-01-01 DIAGNOSIS — G8929 Other chronic pain: Secondary | ICD-10-CM

## 2023-01-01 DIAGNOSIS — M25561 Pain in right knee: Secondary | ICD-10-CM | POA: Diagnosis not present

## 2023-01-01 NOTE — Therapy (Signed)
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   Sjogren's syndrome ([place])    Systemic lupus ([place])    Past Surgical History:  Procedure Laterality Date   APPENDECTOMY     BUNIONECTOMY  2008   right foot   CATARACT EXTRACTION     COLONOSCOPY  12/05/2016   Dr.Jacobs   COLONOSCOPY W/ POLYPECTOMY  08/27/2011   ENDOMETRIAL ABLATION     FOOT SURGERY Right 12/17/2017   bone spur   ingrown toenail Right 12/16/2018   LAPAROSCOPIC APPENDECTOMY  10/11/2011   Procedure: APPENDECTOMY LAPAROSCOPIC;  Surgeon: Adin Hector, MD;  Location: [facility];  Service: General;  Laterality: N/A;  Partial Cecectomy and Appendectomy   POLYPECTOMY     WISDOM TOOTH EXTRACTION     Patient Active Problem List   Diagnosis Date Noted   Encounter for counseling 09/09/2018   Osteopenia of multiple sites 08/22/2017   History of vitamin D deficiency 08/22/2017   Autoimmune disease ([place]) 10/23/2016   Sjogren's syndrome ([place]) 10/09/2016   Primary osteoarthritis of both hands 10/09/2016   Primary osteoarthritis of both knees 10/09/2016   High risk medication use 09/12/2016   SBO (small bowel obstruction), partial postop 10/16/2011   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  Rationale for Evaluation and Treatment: Rehabilitation  ONSET DATE: increased pain since January   SUBJECTIVE:   SUBJECTIVE STATEMENT: The patients knee continues to be sore . She had some soreness after her last visit. She will see the MD on Wednesday.   PERTINENT HISTORY: The patient has a long history of knee OA. She feels like her right is worse then the left.  PAIN:  Are you having pain? Yes: NPRS scale: 4/10 Pain location: bilat knee   Pain description: aching  Aggravating factors: Standing walking bending knees Relieving factors: Rest   PRECAUTIONS: Fall  WEIGHT BEARING RESTRICTIONS: No  FALLS:  Has patient fallen in last 6 months? Yes February 6th the patient fell coming out of a restaurant   LIVING ENVIRONMENT: Steps from the garage into the kitchen and a full basement with 13 steps   OCCUPATION: retired    PLOF: Independent was not using a cane prior to the fall.   PATIENT GOALS: to be prepared to potential surgical procedure; to be more steady.   NEXT MD VISIT: March 14th , Dr. Wynelle Link  OBJECTIVE:   DIAGNOSTIC FINDINGS: X-RAYS SHOW BILATERAL OA   PATIENT SURVEYS:  FOTO    COGNITION: Overall cognitive status: Within functional limits for tasks assessed     SENSATION: WFL  EDEMA:    MUSCLE LENGTH:  POSTURE: No Significant postural limitations  PALPATION:   LOWER EXTREMITY  ROM:  Passive ROM Right eval Left eval  Hip flexion    Hip extension    Hip abduction    Hip adduction    Hip internal rotation    Hip external rotation    Knee flexion Full Full  Knee extension -5 -3  Ankle dorsiflexion    Ankle plantarflexion    Ankle inversion    Ankle eversion     (Blank rows = not tested)  LOWER EXTREMITY MMT:  MMT Right eval Left eval  Hip flexion 19.8 18.8  Hip extension    Hip abduction 23.1 21.1  Hip adduction    Hip internal rotation    Hip external rotation    Knee flexion    Knee extension 29.2 33  Ankle  dorsiflexion    Ankle plantarflexion    Ankle inversion    Ankle eversion     (Blank rows = not tested)    FUNCTIONAL TESTS:    GAIT: Ambulating with cane on the right side initially.  Reviewed how to use a cane on the right left side to take pressure off the right side.  She did well with min cueing.  Decree single-leg stance time on the right compared to left  TODAY'S TREATMENT:                                                                                                                              DATE:  3/13 Manual: PA and AP mobilization bilateral to improve extension; STM to IT band and quads. PROM into extension   Quad set x20  SLR 2x10  Bridge 2x10  Hip abduction 2x15 red   Standing heel raise x20  Standing march x20      3/8 Pt seen for aquatic therapy today.  Treatment took place in water 3.25-4.5 ft in depth at the [facility]. Temp of water was 91.  Pt entered/exited the pool via stairs independently with bilat rail. * without support: walking forward/ backward with cues to allow knee flexion during swing through * side stepping with straight knees and then with increased step height * SLS - with light UE support to no support, up to 10s each LE * holding wall:  heel toe raises; hip abdct/ addct x 10 each; hip ext to toe touch x 10 each  * marching forward/backward * holding yellow noodle:  3 way toe tap x 6 each LE * seated on bench:  flutter kick, cycling, and long sitting hip abdct/ add  * STS with feet on blue step in water x 10, with cues for forward arm reach  * at stairs: quad stretch with foot on 2nd step,  hamstring stretch with DF, fot on 2nd step-- 15s x 2 each LE  Pt requires the buoyancy and hydrostatic pressure of water for support, and to offload joints by unweighting joint load by at least 50 % in navel deep water and by at least 75-80%  in chest to neck deep water.  Viscosity of the water is needed for resistance of  strengthening. Water current perturbations provides challenge to standing balance requiring increased core activation.    PATIENT EDUCATION:  Education details: HEP, symptom management  Person educated: Patient Education method: Explanation, Demonstration, Tactile cues, Verbal cues, and Handouts Education comprehension: verbalized understanding, returned demonstration, verbal cues required, tactile cues required, and needs further education  HOME EXERCISE PROGRAM: Access Code: [email or web address] URL: [email or web address] Date: 12/18/2022 Prepared by: Carolyne Littles  ASSESSMENT:  CLINICAL IMPRESSION: The patient reported pulling in her posterior knee prior to therapy. Therapy focused on regaining terminal knee extension through manual therapy. She tolerated well. Per visible inspection she had a significant improvement in extension. Therapy added in standing exercises and a hamstring stretch in order to maintain gained extension. She will meet with the MD on Thursday.    FROM EVAL: Patient is a 77 year old female with bilateral knee pain right greater than left.  She has a long history of bilateral knee pain.  She has reached the point where the most likely outcome is a right total knee replacement.  She would like to work before that to improve her strength and hopefully decrease pain on both sides prior to her surgery.  She has full knee flexion bilateral but is lacking terminal knee extension on both sides.  She has decreased bilateral lower extremity strength in all major muscle groups.  She has been seen for her lower back before and had success in the pool.  She would benefit from skilled therapy on land and in the water to improve strength and improve functional mobility prior to her total knee replacement.  OBJECTIVE IMPAIRMENTS: Abnormal gait, decreased activity tolerance, decreased endurance, decreased mobility, difficulty walking, decreased ROM, decreased strength, and pain.    ACTIVITY LIMITATIONS: carrying, lifting, sitting, standing, stairs, transfers, and locomotion level  PARTICIPATION LIMITATIONS: meal prep, cleaning, laundry, driving, shopping, and yard work  PERSONAL FACTORS: 1-2 comorbidities: old thoracic compression fracture; lumbar spine pain   are also affecting patient's functional outcome.   REHAB POTENTIAL: good   CLINICAL DECISION MAKING: Evolving/moderate complexity bilateral knee pain increasing with time   EVALUATION COMPLEXITY: Moderate   GOALS: Goals reviewed with patient? Yes  SHORT TERM GOALS: Target date: 01/15/2023   Patient will be independent with basic exercise program Baseline: Goal status: INITIAL  2.  Patient will increase bilateral knee extension by 2 degrees Baseline:  Goal status: INITIAL  3.  Patient will increase gross bilateral lower extremity strength by 5 pounds Baseline:  Goal status: INITIAL   LONG TERM GOALS: Target date: 02/12/2023    Patient will stand for 1 hour without increased bilateral knee pain Baseline:  Goal status: INITIAL  2.  Patient will walk community distances without increased bilateral knee pain Baseline:  Goal status: INITIAL  3.  Patient will be independent with complete HEP for presurgical strengthening to optimize surgical outcome Baseline:  Goal status: INITIAL   PLAN:  PT FREQUENCY: 2x/week 1 time in pool 1 time a length  PT DURATION: 8 weeks  PLANNED INTERVENTIONS: Therapeutic exercises, Therapeutic activity, Neuromuscular re-education, Gait training, Patient/Family education, Self Care, Joint mobilization, Stair training, DME instructions, Aquatic Therapy, Dry Needling, Electrical stimulation, Cryotherapy, Moist heat, Taping, Ultrasound, and Manual therapy  PLAN FOR NEXT SESSION: Consider manual therapy to see if there is potential improved bilateral terminal knee extension.  Expand exercise programs acute close weightbearing exercises next visit consider standing  hip 3-way,  consider standing  march, consider standing heel raise.  Progressed to gym exercises if tolerated.   Carolyne Littles PT DPT  01/01/23 1:36 PM [facility] [address] [place], [place], [address] Phone: [phone]   Fax:  [phone]

## 2023-01-02 ENCOUNTER — Encounter (HOSPITAL_BASED_OUTPATIENT_CLINIC_OR_DEPARTMENT_OTHER): Payer: Self-pay | Admitting: Physical Therapy

## 2023-01-04 ENCOUNTER — Encounter (HOSPITAL_BASED_OUTPATIENT_CLINIC_OR_DEPARTMENT_OTHER): Payer: Self-pay

## 2023-01-04 ENCOUNTER — Ambulatory Visit (HOSPITAL_BASED_OUTPATIENT_CLINIC_OR_DEPARTMENT_OTHER): Payer: Medicare Other | Admitting: Physical Therapy

## 2023-01-07 ENCOUNTER — Other Ambulatory Visit: Payer: Self-pay | Admitting: Family Medicine

## 2023-01-07 DIAGNOSIS — M810 Age-related osteoporosis without current pathological fracture: Secondary | ICD-10-CM

## 2023-01-08 ENCOUNTER — Ambulatory Visit (HOSPITAL_BASED_OUTPATIENT_CLINIC_OR_DEPARTMENT_OTHER): Payer: Medicare Other | Admitting: Physical Therapy

## 2023-01-08 ENCOUNTER — Other Ambulatory Visit: Payer: Self-pay | Admitting: Family Medicine

## 2023-01-08 DIAGNOSIS — R2689 Other abnormalities of gait and mobility: Secondary | ICD-10-CM

## 2023-01-08 DIAGNOSIS — M25561 Pain in right knee: Secondary | ICD-10-CM | POA: Diagnosis not present

## 2023-01-08 DIAGNOSIS — G8929 Other chronic pain: Secondary | ICD-10-CM

## 2023-01-08 DIAGNOSIS — Z1231 Encounter for screening mammogram for malignant neoplasm of breast: Secondary | ICD-10-CM

## 2023-01-08 NOTE — Therapy (Unsigned)
OUTPATIENT PHYSICAL THERAPY LOWER EXTREMITY TREATMENT   Patient Name: [name] MRN: [id number] DOB:Dec 12, 1945, 77 y.o., female Today's Date: 01/09/2023  END OF SESSION:  PT End of Session - 01/08/23 1400     Visit Number 4    Number of Visits 16    Date for PT Re-Evaluation 02/12/23    PT Start Time 1353   therapy late   PT Stop Time [id number]    PT Time Calculation (min) 41 min    Activity Tolerance Patient tolerated treatment well    Behavior During Therapy [facility] for tasks assessed/performed              Past Medical History:  Diagnosis Date   Allergy    Atherosclerosis    Colon polyp    Compression fracture of L1 lumbar vertebra ([place])    dx by ortho   Environmental allergies    Hypothyroid    Osteoarthritis    Osteoarthritis    Osteoarthritis    Osteopenia    Plantar fasciitis, right    Raynauds phenomenon    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   Sjogren's syndrome ([place])    Systemic lupus ([place])    Past Surgical History:  Procedure Laterality Date   APPENDECTOMY     BUNIONECTOMY  2008   right foot   CATARACT EXTRACTION     COLONOSCOPY  12/05/2016   Dr.Jacobs   COLONOSCOPY W/ POLYPECTOMY  08/27/2011   ENDOMETRIAL ABLATION     FOOT SURGERY Right 12/17/2017   bone spur   ingrown toenail Right 12/16/2018   LAPAROSCOPIC APPENDECTOMY  10/11/2011   Procedure: APPENDECTOMY LAPAROSCOPIC;  Surgeon: Adin Hector, MD;  Location: [facility];  Service: General;  Laterality: N/A;  Partial Cecectomy and Appendectomy   POLYPECTOMY     WISDOM TOOTH EXTRACTION     Patient Active Problem List   Diagnosis Date Noted   Encounter for counseling 09/09/2018   Osteopenia of multiple sites 08/22/2017   History of vitamin D deficiency 08/22/2017   Autoimmune disease ([place]) 10/23/2016   Sjogren's syndrome ([place]) 10/09/2016   Primary osteoarthritis of both hands 10/09/2016   Primary osteoarthritis of both knees 10/09/2016   High risk medication use 09/12/2016    SBO (small bowel obstruction), partial postop 10/16/2011   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 She had an injection on the left side. She will likely have a right knee replacement in June.     PERTINENT HISTORY: The patient has a long history of knee OA. She feels like her right is worse then the left.  PAIN:  Are you having pain? Yes: NPRS scale: 1/10 Pain location: bilat knee   Pain description: aching  Aggravating factors: Standing walking bending knees Relieving factors: Rest   PRECAUTIONS: Fall  WEIGHT BEARING RESTRICTIONS: No  FALLS:  Has patient fallen in last 6 months? Yes February 6th the patient fell coming out of a restaurant   LIVING ENVIRONMENT: Steps from the  garage into the kitchen and a full basement with 13 steps   OCCUPATION: retired    PLOF: Independent was not using a cane prior to the fall.   PATIENT GOALS: to be prepared to potential surgical procedure; to be more steady.   NEXT MD VISIT: March 14th , Dr. Wynelle Link  OBJECTIVE:   DIAGNOSTIC FINDINGS: X-RAYS SHOW BILATERAL OA   PATIENT SURVEYS:  FOTO    COGNITION: Overall cognitive status: Within functional limits for tasks assessed     SENSATION: WFL  EDEMA:    MUSCLE LENGTH:  POSTURE: No Significant postural limitations  PALPATION:   LOWER EXTREMITY ROM:  Passive ROM Right eval Left eval  Hip flexion    Hip extension    Hip abduction    Hip adduction    Hip  internal rotation    Hip external rotation    Knee flexion Full Full  Knee extension -5 -3  Ankle dorsiflexion    Ankle plantarflexion    Ankle inversion    Ankle eversion     (Blank rows = not tested)  LOWER EXTREMITY MMT:  MMT Right eval Left eval  Hip flexion 19.8 18.8  Hip extension    Hip abduction 23.1 21.1  Hip adduction    Hip internal rotation    Hip external rotation    Knee flexion    Knee extension 29.2 33  Ankle dorsiflexion    Ankle plantarflexion    Ankle inversion    Ankle eversion     (Blank rows = not tested)    FUNCTIONAL TESTS:    GAIT: Ambulating with cane on the right side initially.  Reviewed how to use a cane on the right left side to take pressure off the right side.  She did well with min cueing.  Decree single-leg stance time on the right compared to left  TODAY'S TREATMENT:                                                                                                                              DATE:  3/19 Quad set x20  SLR 2x10  Bridge 2x10  SAQ 2x10 LAQ 2x10    Manual: PA and AP mobilization bilateral to improve extension; STM to IT band and quads. PROM into extension   3/13 Manual: PA and AP mobilization bilateral to improve extension; STM to IT band and quads. PROM into extension   Quad set x20  SLR 2x10  Bridge 2x10  Hip abduction 2x15 red   Standing heel raise x20  Standing march x20      3/8 Pt seen for aquatic therapy today.  Treatment took place in water 3.25-4.5 ft in depth at the [facility]. Temp of water was 91.  Pt entered/exited the pool via stairs independently with bilat rail. * without support: walking forward/ backward with cues to allow knee flexion during swing through * side stepping with straight knees and then with increased  step height * SLS - with light UE support to no support, up to 10s each LE * holding wall:  heel toe raises; hip abdct/ addct x 10 each; hip ext to toe touch x  10 each  * marching forward/backward * holding yellow noodle:  3 way toe tap x 6 each LE * seated on bench:  flutter kick, cycling, and long sitting hip abdct/ add  * STS with feet on blue step in water x 10, with cues for forward arm reach  * at stairs: quad stretch with foot on 2nd step,  hamstring stretch with DF, fot on 2nd step-- 15s x 2 each LE  Pt requires the buoyancy and hydrostatic pressure of water for support, and to offload joints by unweighting joint load by at least 50 % in navel deep water and by at least 75-80% in chest to neck deep water.  Viscosity of the water is needed for resistance of strengthening. Water current perturbations provides challenge to standing balance requiring increased core activation.    PATIENT EDUCATION:  Education details: HEP, symptom management  Person educated: Patient Education method: Explanation, Demonstration, Tactile cues, Verbal cues, and Handouts Education comprehension: verbalized understanding, returned demonstration, verbal cues required, tactile cues required, and needs further education  HOME EXERCISE PROGRAM: Access Code: [email or web address] URL: [email or web address] Date: 12/18/2022 Prepared by: Carolyne Littles  ASSESSMENT:  CLINICAL IMPRESSION: Per visual inspection the patient has improved terminal knee extension. She had less of the pulling in the back of er knee. We reviewed standing hip abduction as well as gym equipment. Sh liked the Cybex leg press but did not like the LF leg press. We reviewed use of the hip abduction machine. She will try to come in in between visits and get some work in in the gym.     FROM EVAL: Patient is a 77 year old female with bilateral knee pain right greater than left.  She has a long history of bilateral knee pain.  She has reached the point where the most likely outcome is a right total knee replacement.  She would like to work before that to improve her strength and hopefully decrease  pain on both sides prior to her surgery.  She has full knee flexion bilateral but is lacking terminal knee extension on both sides.  She has decreased bilateral lower extremity strength in all major muscle groups.  She has been seen for her lower back before and had success in the pool.  She would benefit from skilled therapy on land and in the water to improve strength and improve functional mobility prior to her total knee replacement.  OBJECTIVE IMPAIRMENTS: Abnormal gait, decreased activity tolerance, decreased endurance, decreased mobility, difficulty walking, decreased ROM, decreased strength, and pain.   ACTIVITY LIMITATIONS: carrying, lifting, sitting, standing, stairs, transfers, and locomotion level  PARTICIPATION LIMITATIONS: meal prep, cleaning, laundry, driving, shopping, and yard work  PERSONAL FACTORS: 1-2 comorbidities: old thoracic compression fracture; lumbar spine pain   are also affecting patient's functional outcome.   REHAB POTENTIAL: good   CLINICAL DECISION MAKING: Evolving/moderate complexity bilateral knee pain increasing with time   EVALUATION COMPLEXITY: Moderate   GOALS: Goals reviewed with patient? Yes  SHORT TERM GOALS: Target date: 01/15/2023   Patient will be independent with basic exercise program Baseline: Goal status: INITIAL  2.  Patient will increase bilateral knee extension by 2 degrees Baseline:  Goal status: INITIAL  3.  Patient will increase gross bilateral lower extremity strength by 5 pounds Baseline:  Goal  status: INITIAL   LONG TERM GOALS: Target date: 02/12/2023    Patient will stand for 1 hour without increased bilateral knee pain Baseline:  Goal status: INITIAL  2.  Patient will walk community distances without increased bilateral knee pain Baseline:  Goal status: INITIAL  3.  Patient will be independent with complete HEP for presurgical strengthening to optimize surgical outcome Baseline:  Goal status:  INITIAL   PLAN:  PT FREQUENCY: 2x/week 1 time in pool 1 time a length  PT DURATION: 8 weeks  PLANNED INTERVENTIONS: Therapeutic exercises, Therapeutic activity, Neuromuscular re-education, Gait training, Patient/Family education, Self Care, Joint mobilization, Stair training, DME instructions, Aquatic Therapy, Dry Needling, Electrical stimulation, Cryotherapy, Moist heat, Taping, Ultrasound, and Manual therapy  PLAN FOR NEXT SESSION: Consider manual therapy to see if there is potential improved bilateral terminal knee extension.  Expand exercise programs acute close weightbearing exercises next visit consider standing hip 3-way,  consider standing march, consider standing heel raise.  Progressed to gym exercises if tolerated.   Carolyne Littles PT DPT  01/09/23 6:04 AM [facility] [address] [place], [place], [address] Phone: [phone]   Fax:  [phone]

## 2023-01-08 NOTE — Progress Notes (Unsigned)
Office Visit Note  Patient: [name]             Date of Birth: January 31, 1946           MRN: [id number]             PCP: Cari Caraway, MD Referring: Cari Caraway, MD Visit Date: 01/22/2023 Occupation: @GUAROCC @  Subjective:  Discuss lab results   History of Present Illness: [name] is a 77 y.o. female with history of sjogren's syndrome and osteoarthritis.  Patient is taking plaquenil 200 mg 1 tablet by mouth daily.  She is tolerating Plaquenil without any side effects and has not missed any doses recently.  She denies any signs or symptoms of a flare.  She continues to have chronic sicca symptoms.  She has been seeing the dentist 4 times a year and the ophthalmologist on a yearly basis.  She updated her Plaquenil eye examination on 10/29/2022.  She denies any sores in her mouth or nose recently.  She has not had any recent rashes, hair loss, or photosensitivity.  She denies any symptoms of Raynaud's phenomenon.  She continues to experience intermittent pain and stiffness use due to underlying osteoarthritis.  Patient reports that she is scheduled for a right knee total arthroplasty on 04/08/2023 which will be performed by Dr. Wynelle Link.  She is currently going to physical therapy twice a week for lower extremity muscle strengthening.  She had a recent left knee joint cortisone injection which was helpful. She is scheduled updated bone density on 07/23/2023.  She continues to take calcium and vitamin D supplements daily.  She is also receiving Prolia 60 mg subcutaneous injections every 6 months.     Activities of Daily Living:  Patient reports morning stiffness for 5 minutes.   Patient Reports nocturnal pain.  Difficulty dressing/grooming: Denies Difficulty climbing stairs: Reports Difficulty getting out of chair: Denies Difficulty using hands for taps, buttons, cutlery, and/or writing: Denies  Review of Systems  Constitutional:  Negative for fatigue.  HENT:  Positive for mouth  dryness. Negative for mouth sores.   Eyes:  Positive for dryness.  Respiratory:  Negative for shortness of breath.   Cardiovascular:  Negative for chest pain and palpitations.  Gastrointestinal:  Negative for blood in stool, constipation and diarrhea.  Endocrine: Negative for increased urination.  Genitourinary:  Negative for involuntary urination.  Musculoskeletal:  Positive for joint pain, gait problem, joint pain, muscle weakness and morning stiffness. Negative for joint swelling, myalgias, muscle tenderness and myalgias.  Skin:  Positive for sensitivity to sunlight. Negative for color change, rash and hair loss.  Allergic/Immunologic: Negative for susceptible to infections.  Neurological:  Negative for dizziness and headaches.  Hematological:  Negative for swollen glands.  Psychiatric/Behavioral:  Positive for sleep disturbance. Negative for depressed mood. The patient is not nervous/anxious.     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% 07/20/2009    Past Medical History:  Diagnosis Date   Allergy    Atherosclerosis    Colon  polyp    Compression fracture of L1 lumbar vertebra    dx by ortho   Environmental allergies    Hypothyroid    Osteoarthritis    Osteoarthritis    Osteoarthritis    Osteopenia    Plantar fasciitis, right    Raynauds phenomenon    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   Sjogren's syndrome    Systemic lupus     Family History  Problem Relation  Age of Onset   Heart disease Mother    Psoriasis Father    Arthritis Father    Heart disease Brother    Heart attack Brother    Diabetes Maternal Grandfather    Cancer Maternal Uncle        colon   Colon cancer Maternal Uncle 80   Asthma Sister    Breast cancer Neg Hx    Past Surgical History:  Procedure Laterality Date   APPENDECTOMY     BUNIONECTOMY  2008   right foot   CATARACT EXTRACTION     COLONOSCOPY  12/05/2016   Dr.Jacobs   COLONOSCOPY W/ POLYPECTOMY  08/27/2011   ENDOMETRIAL ABLATION     FOOT SURGERY Right 12/17/2017   bone spur   ingrown toenail Right 12/16/2018   LAPAROSCOPIC APPENDECTOMY  10/11/2011   Procedure: APPENDECTOMY LAPAROSCOPIC;  Surgeon: Adin Hector, MD;  Location: [facility];  Service: General;  Laterality: N/A;  Partial Cecectomy and Appendectomy   POLYPECTOMY     WISDOM TOOTH EXTRACTION     Social History   Social History Narrative   Not on file   Immunization History  Administered Date(s) Administered   DTaP 06/16/2021   Influenza, High Dose Seasonal PF 07/05/2018, 06/18/2019, 07/11/2022   Influenza,inj,quad, With Preservative 07/10/2017   Influenza-Unspecified 07/23/2020, 06/26/2021, 07/12/2021   PFIZER(Purple Top)SARS-COV-2 Vaccination 11/16/2019, 12/07/2019, 06/14/2020, 05/16/2021   Pfizer Covid-19 Vaccine Bivalent Booster 45yrs & up 07/27/2022   Pneumococcal Conjugate-13 09/08/2014   Pneumococcal Polysaccharide-23 08/30/2011   RSV,unspecified 10/31/2022   Tdap 06/20/2011, 06/16/2021   Zoster Recombinat (Shingrix) 02/05/2017, 05/08/2017   Zoster, Live 10/11/2007     Objective: Vital Signs: BP 120/75 (BP Location: Left Arm, Patient Position: Sitting, Cuff Size: Normal)   Pulse 68   Resp 16   Ht 5' 5.25" (1.657 m)   Wt 150 lb (68 kg)   BMI 24.77 kg/m    Physical Exam Vitals and nursing note reviewed.  Constitutional:      Appearance: She is well-developed.  HENT:     Head: Normocephalic and atraumatic.  Eyes:      Conjunctiva/sclera: Conjunctivae normal.  Cardiovascular:     Rate and Rhythm: Normal rate and regular rhythm.     Heart sounds: Normal heart sounds.  Pulmonary:     Effort: Pulmonary effort is normal.     Breath sounds: Normal breath sounds.  Abdominal:     General: Bowel sounds are normal.     Palpations: Abdomen is soft.  Musculoskeletal:     Cervical back: Normal range of motion.  Lymphadenopathy:     Cervical: No cervical adenopathy.  Skin:    General: Skin is warm and dry.     Capillary Refill: Capillary refill takes less than 2 seconds.  Neurological:     Mental Status: She is alert and oriented to person, place, and time.  Psychiatric:        Behavior: Behavior normal.      Musculoskeletal Exam: C-spine has good ROM.  Mild postural thoracic kyphosis.  No  midline spinal tenderness in the thoracic region.  Shoulder joints, elbow joints, wrist joints, MCPs, PIPs, and DIPs good ROM with no synovitis.  PIP and DIP thickening consistent with osteoarthritis of both hands.  [place] joint prominence bilaterally.  Hip joints have good ROM with no groin pain.  Painful ROM of the right knee.  No warmth or effusion of knee joints. Ankle joints have good ROM with no tenderness or joint swelling.  No tenderness or synovitis of MTP joints.   CDAI Exam: CDAI Score: -- Patient Global: --; Provider Global: -- Swollen: --; Tender: -- Joint Exam 01/22/2023   No joint exam has been documented for this visit   There is currently no information documented on the homunculus. Go to the Rheumatology activity and complete the homunculus joint exam.  Investigation: No additional findings.  Imaging: No results found.  Recent Labs: Lab Results  Component Value Date   WBC 5.9 01/15/2023   HGB 13.5 01/15/2023   PLT 222 01/15/2023   NA 141 01/15/2023   K 4.5 01/15/2023   CL 107 01/15/2023   CO2 26 01/15/2023   GLUCOSE 92 01/15/2023   BUN 11 01/15/2023   CREATININE 0.62 01/15/2023   BILITOT  0.4 01/15/2023   ALKPHOS 48 02/11/2017   AST 18 01/15/2023   ALT 10 01/15/2023   PROT 6.7 01/15/2023   ALBUMIN 3.9 02/11/2017   CALCIUM 9.3 01/15/2023   GFRAA 92 01/18/2021    Speciality Comments: PLQ eye exam: 10/29/2022 Normal. Dr. Rutherford Guys. Follow up in 1 year. (in [place])  PLQ eye exam scheduled for 09/23/2023  Procedures:  No procedures performed Allergies: Boniva [ibandronic acid], Latex, Sulfonamide derivatives, and Bacitracin-polymyxin b      Assessment / Plan:     Visit Diagnoses: Sjogren's syndrome with keratoconjunctivitis sicca - - ANA+, Ro+, sicca symptoms, Raynaud's, inflammatory arthritis: She has not had any signs or symptoms of a flare.  She continues to have chronic sicca symptoms which have been manageable overall.  She is seeing the dentist quarterly and the ophthalmologist on a yearly basis.  She remains on Plaquenil 200 mg 1 tablet by mouth daily.  She is tolerating Plaquenil without any side effects.  She has no signs of inflammatory arthritis at this time.  No active synovitis was noted.  She has not had any signs or symptoms of Raynaud's phenomenon recently. Lab work from 01/15/23 were reviewed today in the office: Ro antibody remains positive, dsDNA negative, complements WNL, ESR WNL, no proteinuria, CBC and CMP WNL.  All questions were addressed.  Patient will remain on Plaquenil as prescribed.  She was advised to notify us if she develops signs or symptoms of a flare.  She will follow-up in the office in 5 months or sooner if needed.  High risk medication use - Plaquenil 200 mg 1 tablet by mouth daily. CBC and CMP WNL on 01/15/23.  She will continue to require updated lab work every 5 months. PLQ eye exam: 10/29/2022 Normal. Dr. Rutherford Guys. Follow up in 1 year.   Primary osteoarthritis of both hands: She has PIP and DIP thickening consistent with osteoarthritis of both hands.  CMC joint prominence noted bilaterally.  Discussed the importance of joint  protection and muscle strengthening.  Trochanteric bursitis of left hip: She experiences intermittent discomfort in the left hip secondary to trochanter bursitis.  On examination today she has good range of motion of the left hip joint with no groin pain.  No tenderness over the left trochanteric  bursa at this time.  Primary osteoarthritis of both knees - [place] for both knees December 2023.  Patient had a fall in February 2024 due to instability in her right knee.  She is scheduled for a right knee total arthroplasty on 04/08/2023 by Dr. Wynelle Link.  She had a recent left knee joint cortisone injection which alleviated her discomfort in the left knee.  She is currently going to physical therapy twice a week including aquatic therapy for lower extremity muscle strengthening. On examination she has painful range of motion of the right knee.  No warmth or effusion was noted today.  Osteopenia of multiple sites - DEXA on 05/03/21: The BMD measured at Femur Neck Right is 0.759 g/cm2 with a T-score of -2.0.  Updated DEXA scheduled for 07/23/2023.  She remains on Prolia 60 mg subcutaneous injections every 6 months--ordered by PCP.  She is also taking a calcium vitamin D supplement daily.  No recent fractures.  She had a fall in February 2024--Currently going to PT/aquatic therapy for lower extremity muscle strengthening.    Closed compression fracture of body of L1 vertebra - She acquired the L1 compression fracture after a fall on the concrete ground.  She could not get kyphoplasty due to the pathology of the fracture.  Postural kyphosis of thoracic region: No midline spinal tenderness in the thoracic region currently.   Vitamin D deficiency: She is taking vitamin D 2000 units daily.  DDD (degenerative disc disease), lumbar: She experiences intermittent discomfort and stiffness in her lower back.  No symptoms of radiculopathy at this time.  Discussed the importance of core strengthening.   Other medical  conditions are listed as follows:   Other fatigue: She is not experiencing any increased fatigue at this time.  History of anxiety: She is taking Lexapro as prescribed.  History of depression: She is taking Lexapro as prescribed.  Hypothyroidism, adult  Orders: No orders of the defined types were placed in this encounter.  No orders of the defined types were placed in this encounter.  Follow-Up Instructions: Return in about 5 months (around 06/24/2023) for Sjogren's syndrome, Osteoarthritis.   Ofilia Neas, PA-C  Note - This record has been created using Dragon software.  Chart creation errors have been sought, but may not always  have been located. Such creation errors do not reflect on  the standard of medical care.

## 2023-01-09 ENCOUNTER — Encounter (HOSPITAL_BASED_OUTPATIENT_CLINIC_OR_DEPARTMENT_OTHER): Payer: Self-pay | Admitting: Physical Therapy

## 2023-01-11 ENCOUNTER — Ambulatory Visit (HOSPITAL_BASED_OUTPATIENT_CLINIC_OR_DEPARTMENT_OTHER): Payer: Medicare Other | Admitting: Physical Therapy

## 2023-01-11 ENCOUNTER — Encounter (HOSPITAL_BASED_OUTPATIENT_CLINIC_OR_DEPARTMENT_OTHER): Payer: Self-pay | Admitting: Physical Therapy

## 2023-01-11 DIAGNOSIS — R2689 Other abnormalities of gait and mobility: Secondary | ICD-10-CM

## 2023-01-11 DIAGNOSIS — G8929 Other chronic pain: Secondary | ICD-10-CM

## 2023-01-11 DIAGNOSIS — M25561 Pain in right knee: Secondary | ICD-10-CM | POA: Diagnosis not present

## 2023-01-11 NOTE — Therapy (Signed)
OUTPATIENT PHYSICAL THERAPY LOWER EXTREMITY TREATMENT   Patient Name: [name] MRN: [id number] DOB:1945/11/24, 77 y.o., female Today's Date: 01/11/2023  END OF SESSION:  PT End of Session - 01/11/23 1305     Visit Number 5    Number of Visits 16    Date for PT Re-Evaluation 02/12/23    PT Start Time 1300    PT Stop Time 1340    PT Time Calculation (min) 40 min              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   Sjogren's syndrome ([place])    Systemic lupus ([place])    Past Surgical History:  Procedure Laterality Date   APPENDECTOMY     BUNIONECTOMY  2008   right foot   CATARACT EXTRACTION     COLONOSCOPY  12/05/2016   Dr.Jacobs   COLONOSCOPY W/ POLYPECTOMY  08/27/2011   ENDOMETRIAL ABLATION     FOOT SURGERY Right 12/17/2017   bone spur   ingrown toenail Right 12/16/2018   LAPAROSCOPIC APPENDECTOMY  10/11/2011   Procedure: APPENDECTOMY LAPAROSCOPIC;  Surgeon: Adin Hector, MD;  Location: [facility];  Service: General;  Laterality: N/A;  Partial Cecectomy and Appendectomy   POLYPECTOMY     WISDOM TOOTH EXTRACTION     Patient Active Problem List   Diagnosis Date Noted   Encounter for counseling 09/09/2018   Osteopenia of multiple sites 08/22/2017   History of vitamin D deficiency 08/22/2017   Autoimmune disease ([place]) 10/23/2016   Sjogren's syndrome ([place]) 10/09/2016   Primary osteoarthritis of both hands 10/09/2016   Primary osteoarthritis of both knees 10/09/2016   High risk medication use 09/12/2016   SBO (small bowel obstruction), partial postop 10/16/2011   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  She reports Dr. Wynelle Link would like her to continue PT for now.  "I feel like my legs are getting stronger, and I've been working on not pushing up from chairs with my arms".     PERTINENT HISTORY: The patient has a long history of knee OA. She feels like her right is worse then the left.  PAIN:  Are you having pain? Yes: NPRS scale: 4/10 Pain location: Rt knee   Pain description: aching  Aggravating factors: Standing walking bending knees Relieving factors: Rest   PRECAUTIONS: Fall  WEIGHT BEARING RESTRICTIONS: No  FALLS:  Has patient fallen in last 6 months? Yes February 6th the patient fell coming out of a restaurant   LIVING ENVIRONMENT: Steps from the garage into the kitchen  and a full basement with 13 steps   OCCUPATION: retired    PLOF: Independent was not using a cane prior to the fall.   PATIENT GOALS: to be prepared to potential surgical procedure; to be more steady.   NEXT MD VISIT:   OBJECTIVE:   DIAGNOSTIC FINDINGS: X-RAYS SHOW BILATERAL OA   PATIENT SURVEYS:  FOTO    COGNITION: Overall cognitive status: Within functional limits for tasks assessed     SENSATION: WFL  EDEMA:    MUSCLE LENGTH:  POSTURE: No Significant postural limitations  PALPATION:   LOWER EXTREMITY ROM:  Passive ROM Right eval Left eval  Hip flexion    Hip extension    Hip abduction    Hip adduction    Hip internal rotation    Hip external rotation    Knee flexion Full Full   Knee extension -5 -3  Ankle dorsiflexion    Ankle plantarflexion    Ankle inversion    Ankle eversion     (Blank rows = not tested)  LOWER EXTREMITY MMT:  MMT Right eval Left eval  Hip flexion 19.8 18.8  Hip extension    Hip abduction 23.1 21.1  Hip adduction    Hip internal rotation    Hip external rotation    Knee flexion    Knee extension 29.2 33  Ankle dorsiflexion    Ankle plantarflexion    Ankle inversion    Ankle eversion     (Blank rows = not tested)    FUNCTIONAL TESTS:    GAIT: Ambulating with cane on the right side initially.  Reviewed how to use a cane on the right left side to take pressure off the right side.  She did well with min cueing.  Decree single-leg stance time on the right compared to left  TODAY'S TREATMENT:                                                                                                                              DATE:  3/22 Pt seen for aquatic therapy today.  Treatment took place in water 3.25-4 ft in depth at the [facility] pool. Temp of water was 91.  Pt entered/exited the pool via stairs independently with bilat rail. * without support: walking forward/ backward * side stepping with straight knees and then with increased step height -2 laps * forward/backward marching - 1 lap  * SLS - with light UE support to no support,3 way toe touch x 10 each LE * holding wall:  heel toe raises; hip abdct/ addct x 10 each * forward step up/retro step down x 10 each LE;  forward step down/retro step up x 5 each LE * seated on bench:  flutter kick, cycling, and long sitting hip abdct/ add  * STS with feet on blue step in water x 10, with cues for forward arm reach  *  quad stretch with foot on bench with opp foot  on blue step,  hamstring stretch with DF, foot on 2nd step-- 15s x 2 each LE  Pt requires the buoyancy and hydrostatic pressure of water for support, and to offload joints by unweighting joint load by at least 50 %  in navel deep water and by at least 75-80% in chest to neck deep water.  Viscosity of the water is needed for resistance of strengthening. Water current perturbations provides challenge to standing balance requiring increased core activation.   3/19 Quad set x20  SLR 2x10  Bridge 2x10  SAQ 2x10 LAQ 2x10    Manual: PA and AP mobilization bilateral to improve extension; STM to IT band and quads. PROM into extension   3/13 Manual: PA and AP mobilization bilateral to improve extension; STM to IT band and quads. PROM into extension   Quad set x20  SLR 2x10  Bridge 2x10  Hip abduction 2x15 red   Standing heel raise x20  Standing march x20   PATIENT EDUCATION:  Education details: HEP, symptom management  Person educated: Patient Education method: Explanation, Demonstration, Tactile cues, Verbal cues, and Handouts Education comprehension: verbalized understanding, returned demonstration, verbal cues required, tactile cues required, and needs further education  HOME EXERCISE PROGRAM: Access Code: [email or web address] URL: [email or web address] Date: 12/18/2022 Prepared by: Carolyne Littles  ASSESSMENT:  CLINICAL IMPRESSION:  Good tolerance for aquatic exercises today.  Minor cues for Rt knee/lower leg alignment needed for STS and stairs.  She reported slight lower back discomfort at end of session, but reported that sitting in car afterwards typically relieves it.  Pt progressing towards goals   She would benefit from skilled therapy on land and in the water to improve strength and improve functional mobility prior to her total knee replacement.  PT to assess STGs next session.   OBJECTIVE IMPAIRMENTS: Abnormal gait, decreased activity tolerance, decreased endurance, decreased mobility, difficulty walking, decreased ROM, decreased strength, and pain.   ACTIVITY LIMITATIONS: carrying, lifting, sitting, standing, stairs, transfers, and locomotion level  PARTICIPATION LIMITATIONS:  meal prep, cleaning, laundry, driving, shopping, and yard work  PERSONAL FACTORS: 1-2 comorbidities: old thoracic compression fracture; lumbar spine pain   are also affecting patient's functional outcome.   REHAB POTENTIAL: good   CLINICAL DECISION MAKING: Evolving/moderate complexity bilateral knee pain increasing with time   EVALUATION COMPLEXITY: Moderate   GOALS: Goals reviewed with patient? Yes  SHORT TERM GOALS: Target date: 01/15/2023   Patient will be independent with basic exercise program Baseline: Goal status: INITIAL  2.  Patient will increase bilateral knee extension by 2 degrees Baseline:  Goal status: INITIAL  3.  Patient will increase gross bilateral lower extremity strength by 5 pounds Baseline:  Goal status: INITIAL   LONG TERM GOALS: Target date: 02/12/2023    Patient will stand for 1 hour without increased bilateral knee pain Baseline:  Goal status: INITIAL  2.  Patient will walk community distances without increased bilateral knee pain Baseline:  Goal status: INITIAL  3.  Patient will be independent with complete HEP for presurgical strengthening to optimize surgical outcome Baseline:  Goal status: INITIAL   PLAN:  PT FREQUENCY: 2x/week 1 time in pool 1 time on land  PT DURATION: 8 weeks  PLANNED INTERVENTIONS: Therapeutic exercises, Therapeutic activity, Neuromuscular re-education, Gait training, Patient/Family education, Self Care, Joint mobilization, Stair training, DME instructions, Aquatic Therapy, Dry Needling, Electrical stimulation, Cryotherapy, Moist heat, Taping, Ultrasound, and Manual therapy  PLAN FOR NEXT SESSION: Consider manual therapy to see if there is potential improved bilateral terminal  knee extension.  Expand exercise programs acute close weightbearing exercises next visit consider standing hip 3-way,  consider standing march, consider standing heel raise.  Progressed to gym exercises if tolerated.  Kerin Perna,  PTA 01/11/23 1:48 PM [facility] [address] [place], [place], [address] Phone: [phone]   Fax:  [phone]

## 2023-01-15 ENCOUNTER — Other Ambulatory Visit: Payer: Self-pay | Admitting: *Deleted

## 2023-01-15 ENCOUNTER — Ambulatory Visit (HOSPITAL_BASED_OUTPATIENT_CLINIC_OR_DEPARTMENT_OTHER): Payer: Medicare Other | Admitting: Physical Therapy

## 2023-01-15 ENCOUNTER — Encounter (HOSPITAL_BASED_OUTPATIENT_CLINIC_OR_DEPARTMENT_OTHER): Payer: Self-pay | Admitting: Physical Therapy

## 2023-01-15 DIAGNOSIS — Z79899 Other long term (current) drug therapy: Secondary | ICD-10-CM

## 2023-01-15 DIAGNOSIS — M3501 Sicca syndrome with keratoconjunctivitis: Secondary | ICD-10-CM

## 2023-01-15 DIAGNOSIS — R2689 Other abnormalities of gait and mobility: Secondary | ICD-10-CM

## 2023-01-15 DIAGNOSIS — M25561 Pain in right knee: Secondary | ICD-10-CM | POA: Diagnosis not present

## 2023-01-15 DIAGNOSIS — G8929 Other chronic pain: Secondary | ICD-10-CM

## 2023-01-15 NOTE — Therapy (Signed)
OUTPATIENT PHYSICAL THERAPY LOWER EXTREMITY TREATMENT   Patient Name: [name] MRN: [id number] DOB:November 16, 1945, 77 y.o., female Today's Date: 01/15/2023  END OF SESSION:  PT End of Session - 01/15/23 0941     Visit Number 6    Number of Visits 16    Date for PT Re-Evaluation 02/12/23    PT Start Time 0845    PT Stop Time 0926    PT Time Calculation (min) 41 min    Activity Tolerance Patient tolerated treatment well    Behavior During Therapy [facility] for tasks assessed/performed              Past Medical History:  Diagnosis Date   Allergy    Atherosclerosis    Colon polyp    Compression fracture of L1 lumbar vertebra ([place])    dx by ortho   Environmental allergies    Hypothyroid    Osteoarthritis    Osteoarthritis    Osteoarthritis    Osteopenia    Plantar fasciitis, right    Raynauds phenomenon    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   Sjogren's syndrome ([place])    Systemic lupus ([place])    Past Surgical History:  Procedure Laterality Date   APPENDECTOMY     BUNIONECTOMY  2008   right foot   CATARACT EXTRACTION     COLONOSCOPY  12/05/2016   Dr.Jacobs   COLONOSCOPY W/ POLYPECTOMY  08/27/2011   ENDOMETRIAL ABLATION     FOOT SURGERY Right 12/17/2017   bone spur   ingrown toenail Right 12/16/2018   LAPAROSCOPIC APPENDECTOMY  10/11/2011   Procedure: APPENDECTOMY LAPAROSCOPIC;  Surgeon: Adin Hector, MD;  Location: [facility];  Service: General;  Laterality: N/A;  Partial Cecectomy and Appendectomy   POLYPECTOMY     WISDOM TOOTH EXTRACTION     Patient Active Problem List   Diagnosis Date Noted   Encounter for counseling 09/09/2018   Osteopenia of multiple sites 08/22/2017   History of vitamin D deficiency 08/22/2017   Autoimmune disease ([place]) 10/23/2016   Sjogren's syndrome ([place]) 10/09/2016   Primary osteoarthritis of both hands 10/09/2016   Primary osteoarthritis of both knees 10/09/2016   High risk medication use 09/12/2016   SBO (small  bowel obstruction), partial postop 10/16/2011   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 Patient had some minor pain in her back. We were able to progress her HEP, she tolerated treatment well.    PERTINENT HISTORY: The patient has a long history of knee OA. She feels like her right is worse then the left.  PAIN:  Are you having pain? Yes: NPRS scale: 4/10 Pain location: Rt knee   Pain description: aching  Aggravating factors: Standing walking bending knees Relieving factors: Rest   PRECAUTIONS: Fall  WEIGHT BEARING RESTRICTIONS: No  FALLS:  Has patient fallen in last 6 months? Yes February 6th the patient fell coming out of a restaurant   LIVING ENVIRONMENT: Steps from the garage into the kitchen  and a full basement with 13 steps   OCCUPATION: retired    PLOF: Independent was not using a cane prior to the fall.   PATIENT GOALS: to be prepared to potential surgical procedure; to be more steady.   NEXT MD VISIT:   OBJECTIVE:   DIAGNOSTIC FINDINGS: X-RAYS SHOW BILATERAL OA   PATIENT SURVEYS:  FOTO    COGNITION: Overall cognitive status: Within functional limits for tasks assessed     SENSATION: WFL  EDEMA:    MUSCLE LENGTH:  POSTURE: No Significant postural limitations  PALPATION:   LOWER EXTREMITY ROM:  Passive ROM Right eval Left eval  Hip flexion    Hip extension    Hip abduction    Hip adduction    Hip internal rotation    Hip external  rotation    Knee flexion Full Full  Knee extension -5 -3  Ankle dorsiflexion    Ankle plantarflexion    Ankle inversion    Ankle eversion     (Blank rows = not tested)  LOWER EXTREMITY MMT:  MMT Right eval Left eval  Hip flexion 19.8 18.8  Hip extension    Hip abduction 23.1 21.1  Hip adduction    Hip internal rotation    Hip external rotation    Knee flexion    Knee extension 29.2 33  Ankle dorsiflexion    Ankle plantarflexion    Ankle inversion    Ankle eversion     (Blank rows = not tested)    FUNCTIONAL TESTS:    GAIT: Ambulating with cane on the right side initially.  Reviewed how to use a cane on the right left side to take pressure off the right side.  She did well with min cueing.  Decree single-leg stance time on the right compared to left  TODAY'S TREATMENT:                                                                                                                              DATE:  3/26 Nu step x5 mins L4 Quad set x20  SLR 2x10  Bridge 2x10  SAQ 2x10 1lbs LAQ 2x10 1lbs Heel Raises 2 x10 Standing hip abduction 2x10 bilateral Lateral and forward step ups 2 inch 1x10   Manual ; Grade I and II PA and AP mobilization for improved extension  3/22 Pt seen for aquatic therapy today.  Treatment took place in water 3.25-4 ft in depth at the [facility] pool. Temp of water was 91.  Pt entered/exited the pool via stairs independently with bilat rail. * without support: walking forward/ backward * side stepping with straight knees and then with increased step height -2 laps * forward/backward marching - 1 lap  * SLS - with light UE support to no support,3 way toe touch x 10 each LE * holding wall:  heel toe raises; hip abdct/ addct x 10 each * forward step up/retro step down x 10 each  LE;  forward step down/retro step up x 5 each LE * seated on bench:  flutter kick, cycling, and long sitting hip abdct/ add  * STS with feet on blue step in water  x 10, with cues for forward arm reach  *  quad stretch with foot on bench with opp foot on blue step,  hamstring stretch with DF, foot on 2nd step-- 15s x 2 each LE  Pt requires the buoyancy and hydrostatic pressure of water for support, and to offload joints by unweighting joint load by at least 50 % in navel deep water and by at least 75-80% in chest to neck deep water.  Viscosity of the water is needed for resistance of strengthening. Water current perturbations provides challenge to standing balance requiring increased core activation.   3/19 Quad set x20  SLR 2x10  Bridge 2x10  SAQ 2x10 LAQ 2x10    Manual: PA and AP mobilization bilateral to improve extension; STM to IT band and quads. PROM into extension   3/13 Manual: PA and AP mobilization bilateral to improve extension; STM to IT band and quads. PROM into extension   Quad set x20  SLR 2x10  Bridge 2x10  Hip abduction 2x15 red   Standing heel raise x20  Standing march x20   PATIENT EDUCATION:  Education details: HEP, symptom management  Person educated: Patient Education method: Explanation, Demonstration, Tactile cues, Verbal cues, and Handouts Education comprehension: verbalized understanding, returned demonstration, verbal cues required, tactile cues required, and needs further education  HOME EXERCISE PROGRAM: Access Code: [email or web address] URL: [email or web address] Date: 12/18/2022 Prepared by: Carolyne Littles  ASSESSMENT:  CLINICAL IMPRESSION: Patient tolerated treatment well. Her right knee extension PROM is coming along. Progress patient HEP with lateral and forward step ups with 2inch block. Emphasize and encourage patient to keep following HEP. She would continue to benefit from skilled therapy to improve strength and improve functional mobility before having TKR.   OBJECTIVE IMPAIRMENTS: Abnormal gait, decreased activity tolerance, decreased endurance, decreased mobility, difficulty walking, decreased  ROM, decreased strength, and pain.   ACTIVITY LIMITATIONS: carrying, lifting, sitting, standing, stairs, transfers, and locomotion level  PARTICIPATION LIMITATIONS: meal prep, cleaning, laundry, driving, shopping, and yard work  PERSONAL FACTORS: 1-2 comorbidities: old thoracic compression fracture; lumbar spine pain   are also affecting patient's functional outcome.   REHAB POTENTIAL: good   CLINICAL DECISION MAKING: Evolving/moderate complexity bilateral knee pain increasing with time   EVALUATION COMPLEXITY: Moderate   GOALS: Goals reviewed with patient? Yes  SHORT TERM GOALS: Target date: 01/15/2023   Patient will be independent with basic exercise program Baseline: Goal status: INITIAL  2.  Patient will increase bilateral knee extension by 2 degrees Baseline:  Goal status: INITIAL  3.  Patient will increase gross bilateral lower extremity strength by 5 pounds Baseline:  Goal status: INITIAL   LONG TERM GOALS: Target date: 02/12/2023    Patient will stand for 1 hour without increased bilateral knee pain Baseline:  Goal status: INITIAL  2.  Patient will walk community distances without increased bilateral knee pain Baseline:  Goal status: INITIAL  3.  Patient will be independent with complete HEP for presurgical strengthening to optimize surgical outcome Baseline:  Goal status: INITIAL   PLAN:  PT FREQUENCY: 2x/week 1 time in pool 1 time on land  PT DURATION: 8 weeks  PLANNED INTERVENTIONS: Therapeutic exercises, Therapeutic activity, Neuromuscular re-education, Gait training, Patient/Family education, Self Care, Joint mobilization, Stair training, DME instructions, Aquatic Therapy, Dry Needling, Electrical stimulation,  Cryotherapy, Moist heat, Taping, Ultrasound, and Manual therapy  PLAN FOR NEXT SESSION: Consider manual therapy to see if there is potential improved bilateral terminal knee extension.  Expand exercise programs acute close weightbearing  exercises next visit consider standing hip 3-way,  consider standing march, consider standing heel raise.  Progressed to gym exercises if tolerated.  Carolyne Littles PT DPT  01/15/23 6:26 PM [facility] [address] [place], [place], [address] Phone: [phone]   Fax:  [phone] During this treatment session, the therapist was present, participating in and directing the treatment.

## 2023-01-16 LAB — COMPLETE METABOLIC PANEL WITH GFR
AG Ratio: 1.7 (calc) (ref 1.0–2.5)
ALT: 10 U/L (ref 6–29)
AST: 18 U/L (ref 10–35)
Albumin: 4.2 g/dL (ref 3.6–5.1)
Alkaline phosphatase (APISO): 40 U/L (ref 37–153)
BUN: 11 mg/dL (ref 7–25)
CO2: 26 mmol/L (ref 20–32)
Calcium: 9.3 mg/dL (ref 8.6–10.4)
Chloride: 107 mmol/L (ref 98–110)
Creat: 0.62 mg/dL (ref 0.60–1.00)
Globulin: 2.5 g/dL (calc) (ref 1.9–3.7)
Glucose, Bld: 92 mg/dL (ref 65–99)
Potassium: 4.5 mmol/L (ref 3.5–5.3)
Sodium: 141 mmol/L (ref 135–146)
Total Bilirubin: 0.4 mg/dL (ref 0.2–1.2)
Total Protein: 6.7 g/dL (ref 6.1–8.1)
eGFR: 92 mL/min/{1.73_m2} (ref >=60)

## 2023-01-16 LAB — CBC WITH DIFFERENTIAL/PLATELET
Absolute Monocytes: 519 cells/uL (ref 200–950)
Basophils Absolute: 59 cells/uL (ref 0–200)
Basophils Relative: 1 %
Eosinophils Absolute: 319 cells/uL (ref 15–500)
Eosinophils Relative: 5.4 %
HCT: 40.1 % (ref 35.0–45.0)
Hemoglobin: 13.5 g/dL (ref 11.7–15.5)
Lymphs Abs: 1056 cells/uL (ref 850–3900)
MCH: 32.6 pg (ref 27.0–33.0)
MCHC: 33.7 g/dL (ref 32.0–36.0)
MCV: 96.9 fL (ref 80.0–100.0)
MPV: 10.9 fL (ref 7.5–12.5)
Monocytes Relative: 8.8 %
Neutro Abs: 3947 cells/uL (ref 1500–7800)
Neutrophils Relative %: 66.9 %
Platelets: 222 10*3/uL (ref 140–400)
RBC: 4.14 10*6/uL (ref 3.80–5.10)
RDW: 11.5 % (ref 11.0–15.0)
Total Lymphocyte: 17.9 %
WBC: 5.9 10*3/uL (ref 3.8–10.8)

## 2023-01-16 LAB — SJOGRENS SYNDROME-A EXTRACTABLE NUCLEAR ANTIBODY: SSA (Ro) (ENA) Antibody, IgG: >8 AI — AB

## 2023-01-16 LAB — PROTEIN / CREATININE RATIO, URINE
Creatinine, Urine: 22 mg/dL (ref 20–275)
Protein/Creat Ratio: 136 mg/g creat (ref 24–184)
Protein/Creatinine Ratio: 0.136 mg/mg creat (ref 0.024–0.184)
Total Protein, Urine: 3 mg/dL — ABNORMAL LOW (ref 5–24)

## 2023-01-16 LAB — SEDIMENTATION RATE: Sed Rate: 9 mm/h (ref 0–30)

## 2023-01-16 LAB — ANTI-DNA ANTIBODY, DOUBLE-STRANDED: ds DNA Ab: <1 IU/mL

## 2023-01-16 LAB — C3 AND C4
C3 Complement: 107 mg/dL (ref 83–193)
C4 Complement: 24 mg/dL (ref 15–57)

## 2023-01-16 NOTE — Therapy (Signed)
OUTPATIENT PHYSICAL THERAPY LOWER EXTREMITY TREATMENT   Patient Name: [name] MRN: [id number] DOB:[birth year], 77 y.o., female Today's Date: 01/17/2023  END OF SESSION:  PT End of Session - 01/17/23 1119     Visit Number 7    Number of Visits 16    Date for PT Re-Evaluation 02/12/23    PT Start Time 1115    PT Stop Time 1200    PT Time Calculation (min) 45 min    Activity Tolerance Patient tolerated treatment well    Behavior During Therapy [facility] for tasks assessed/performed              Past Medical History:  Diagnosis Date   Allergy    Atherosclerosis    Colon polyp    Compression fracture of L1 lumbar vertebra ([place])    dx by ortho   Environmental allergies    Hypothyroid    Osteoarthritis    Osteoarthritis    Osteoarthritis    Osteopenia    Plantar fasciitis, right    Raynauds phenomenon    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   Sjogren's syndrome ([place])    Systemic lupus ([place])    Past Surgical History:  Procedure Laterality Date   APPENDECTOMY     BUNIONECTOMY  2008   right foot   CATARACT EXTRACTION     COLONOSCOPY  12/05/2016   Dr.Jacobs   COLONOSCOPY W/ POLYPECTOMY  08/27/2011   ENDOMETRIAL ABLATION     FOOT SURGERY Right 12/17/2017   bone spur   ingrown toenail Right 12/16/2018   LAPAROSCOPIC APPENDECTOMY  10/11/2011   Procedure: APPENDECTOMY LAPAROSCOPIC;  Surgeon: Adin Hector, MD;  Location: [facility];  Service: General;  Laterality: N/A;  Partial Cecectomy and Appendectomy   POLYPECTOMY     WISDOM TOOTH EXTRACTION     Patient Active Problem List   Diagnosis Date Noted   Encounter for counseling 09/09/2018   Osteopenia of multiple sites 08/22/2017   History of vitamin D deficiency 08/22/2017   Autoimmune disease ([place]) 10/23/2016   Sjogren's syndrome ([place]) 10/09/2016   Primary osteoarthritis of both hands 10/09/2016   Primary osteoarthritis of both knees 10/09/2016   High risk medication use 09/12/2016   SBO (small  bowel obstruction), partial postop 10/16/2011   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 She feels like her right is worse then the left.  PAIN:  Are you having pain? Yes: NPRS scale: 4/10 Pain location: Rt knee   Pain description: aching  Aggravating factors: Standing walking bending knees Relieving factors: Rest   PRECAUTIONS: Fall  WEIGHT BEARING RESTRICTIONS: No  FALLS:  Has patient fallen in last 6 months? Yes February 6th the patient fell coming out of a restaurant   LIVING ENVIRONMENT: Steps from the garage into the kitchen and a full basement with 13 steps   OCCUPATION: retired    PLOF: Independent was not using a cane prior to  the fall.   PATIENT GOALS: to be prepared to potential surgical procedure; to be more steady.   NEXT MD VISIT:   OBJECTIVE:   DIAGNOSTIC FINDINGS: X-RAYS SHOW BILATERAL OA   PATIENT SURVEYS:  FOTO    COGNITION: Overall cognitive status: Within functional limits for tasks assessed     SENSATION: WFL  EDEMA:    MUSCLE LENGTH:  POSTURE: No Significant postural limitations  PALPATION:   LOWER EXTREMITY ROM:  Passive ROM Right eval Left eval  Hip flexion    Hip extension    Hip abduction    Hip adduction    Hip internal rotation    Hip external rotation    Knee flexion Full Full  Knee extension -5 -3  Ankle dorsiflexion    Ankle plantarflexion    Ankle inversion     Ankle eversion     (Blank rows = not tested)  LOWER EXTREMITY MMT:  MMT Right eval Left eval  Hip flexion 19.8 18.8  Hip extension    Hip abduction 23.1 21.1  Hip adduction    Hip internal rotation    Hip external rotation    Knee flexion    Knee extension 29.2 33  Ankle dorsiflexion    Ankle plantarflexion    Ankle inversion    Ankle eversion     (Blank rows = not tested)    FUNCTIONAL TESTS:    GAIT: Ambulating with cane on the right side initially.  Reviewed how to use a cane on the right left side to take pressure off the right side.  She did well with min cueing.  Decree single-leg stance time on the right compared to left  TODAY'S TREATMENT:                                                                                                                              DATE:   01/17/23 Pt seen for aquatic therapy today.  Treatment took place in water 3.25-4 ft in depth at the [facility] pool. Temp of water was 91.  Pt entered/exited the pool via stairs independently with bilat rail. * without support: walking forward/ backward * side stepping with straight knees *side lunges x 2 width ( not tolerated well r knee) * SLS - with light UE support to no support, UE add/abd with rainbow HB x 10 R/L    - 3 way toe touch x 10 each LE; hip flex/ext R/L alternating x10 * holding wall:  heel toe raises; hip abdct/ addct; hip ext x12 *  quad stretch and hamstring stretch on step. Gentle self overpressure  into right knee ext * seated on bench:  flutter kick, cycling, and long sitting hip abdct/ add   Pt requires the buoyancy and hydrostatic pressure of water for support, and to offload joints by unweighting joint load by at least 50 % in navel deep water and by at least 75-80% in chest to neck deep water.  Viscosity of the water is needed for resistance of strengthening. Water current perturbations provides challenge to standing balance requiring increased core  activation.   3/26 Nu step x5 mins L4 Quad set x20  SLR 2x10  Bridge 2x10  SAQ 2x10 1lbs LAQ 2x10 1lbs Heel Raises 2 x10 Standing hip abduction 2x10 bilateral Lateral and forward step ups 2 inch 1x10   Manual ; Grade I and II PA and AP mobilization for improved extension     3/19 Quad set x20  SLR 2x10  Bridge 2x10  SAQ 2x10 LAQ 2x10    Manual: PA and AP mobilization bilateral to improve extension; STM to IT band and quads. PROM into extension   3/13 Manual: PA and AP mobilization bilateral to improve extension; STM to IT band and quads. PROM into extension   Quad set x20  SLR 2x10  Bridge 2x10  Hip abduction 2x15 red   Standing heel raise x20  Standing march x20   PATIENT EDUCATION:  Education details: HEP, symptom management  Person educated: Patient Education method: Explanation, Demonstration, Tactile cues, Verbal cues, and Handouts Education comprehension: verbalized understanding, returned demonstration, verbal cues required, tactile cues required, and needs further education  HOME EXERCISE PROGRAM: Access Code: [email or web address] URL: [email or web address] Date: 12/18/2022 Prepared by: Carolyne Littles  ASSESSMENT:  CLINICAL IMPRESSION:  Focused on proprioception and balance retaining. Good right knee ROM length with stretching. Instructed her on gentle overpressure into extension right knee. Reports compliance with HEP and good response to last session. PT edu on use of ice packs post surgery and being aware of post surgical constipation. Tolerates session  well. Goals ongoing   OBJECTIVE IMPAIRMENTS: Abnormal gait, decreased activity tolerance, decreased endurance, decreased mobility, difficulty walking, decreased ROM, decreased strength, and pain.   ACTIVITY LIMITATIONS: carrying, lifting, sitting, standing, stairs, transfers, and locomotion level  PARTICIPATION LIMITATIONS: meal prep, cleaning, laundry, driving, shopping, and yard  work  PERSONAL FACTORS: 1-2 comorbidities: old thoracic compression fracture; lumbar spine pain   are also affecting patient's functional outcome.   REHAB POTENTIAL: good   CLINICAL DECISION MAKING: Evolving/moderate complexity bilateral knee pain increasing with time   EVALUATION COMPLEXITY: Moderate   GOALS: Goals reviewed with patient? Yes  SHORT TERM GOALS: Target date: 01/15/2023   Patient will be independent with basic exercise program Baseline: Goal status: INITIAL  2.  Patient will increase bilateral knee extension by 2 degrees Baseline:  Goal status: INITIAL  3.  Patient will increase gross bilateral lower extremity strength by 5 pounds Baseline:  Goal status: INITIAL   LONG TERM GOALS: Target date: 02/12/2023    Patient will stand for 1 hour without increased bilateral knee pain Baseline:  Goal status: INITIAL  2.  Patient will walk community distances without increased bilateral knee pain Baseline:  Goal status: INITIAL  3.  Patient will be independent with complete HEP for presurgical strengthening to optimize surgical outcome Baseline:  Goal status: INITIAL   PLAN:  PT FREQUENCY: 2x/week 1 time in pool 1 time on land  PT DURATION: 8 weeks  PLANNED INTERVENTIONS: Therapeutic exercises, Therapeutic activity, Neuromuscular re-education, Gait training, Patient/Family education, Self Care, Joint mobilization, Stair training, DME instructions, Aquatic Therapy, Dry Needling, Electrical stimulation, Cryotherapy, Moist heat, Taping, Ultrasound, and Manual therapy  PLAN FOR NEXT SESSION: Consider manual therapy to see if there is potential improved bilateral terminal knee extension.  Expand exercise programs acute close weightbearing exercises next visit consider standing hip 3-way,  consider standing march, consider standing heel raise.  Progressed to gym exercises if tolerated.  [facility] [place]) Nitya Cauthon MPT 01/17/23 1:00 PM [facility] [place], [place], [address] Phone: [phone]   Fax:  [phone] During this treatment session, the therapist was present, participating in and directing the treatment.

## 2023-01-16 NOTE — Progress Notes (Signed)
SSA antibodies positive as before, double-stranded DNA negative, complements normal, sed rate normal.  Labs do not indicate an autoimmune disease flare.  No change in treatment advised.

## 2023-01-17 ENCOUNTER — Ambulatory Visit (HOSPITAL_BASED_OUTPATIENT_CLINIC_OR_DEPARTMENT_OTHER): Payer: Medicare Other | Admitting: Physical Therapy

## 2023-01-17 ENCOUNTER — Encounter (HOSPITAL_BASED_OUTPATIENT_CLINIC_OR_DEPARTMENT_OTHER): Payer: Self-pay | Admitting: Physical Therapy

## 2023-01-17 DIAGNOSIS — M25561 Pain in right knee: Secondary | ICD-10-CM | POA: Diagnosis not present

## 2023-01-17 DIAGNOSIS — G8929 Other chronic pain: Secondary | ICD-10-CM

## 2023-01-17 DIAGNOSIS — R2689 Other abnormalities of gait and mobility: Secondary | ICD-10-CM

## 2023-01-21 ENCOUNTER — Encounter (HOSPITAL_BASED_OUTPATIENT_CLINIC_OR_DEPARTMENT_OTHER): Payer: Self-pay | Admitting: Physical Therapy

## 2023-01-21 ENCOUNTER — Ambulatory Visit (HOSPITAL_BASED_OUTPATIENT_CLINIC_OR_DEPARTMENT_OTHER): Payer: Medicare Other | Attending: Family Medicine | Admitting: Physical Therapy

## 2023-01-21 DIAGNOSIS — S32010A Wedge compression fracture of first lumbar vertebra, initial encounter for closed fracture: Secondary | ICD-10-CM | POA: Diagnosis present

## 2023-01-21 DIAGNOSIS — M4004 Postural kyphosis, thoracic region: Secondary | ICD-10-CM | POA: Insufficient documentation

## 2023-01-21 DIAGNOSIS — E559 Vitamin D deficiency, unspecified: Secondary | ICD-10-CM | POA: Diagnosis present

## 2023-01-21 DIAGNOSIS — M3501 Sicca syndrome with keratoconjunctivitis: Secondary | ICD-10-CM | POA: Diagnosis present

## 2023-01-21 DIAGNOSIS — R5383 Other fatigue: Secondary | ICD-10-CM | POA: Insufficient documentation

## 2023-01-21 DIAGNOSIS — R2689 Other abnormalities of gait and mobility: Secondary | ICD-10-CM

## 2023-01-21 DIAGNOSIS — Z8659 Personal history of other mental and behavioral disorders: Secondary | ICD-10-CM | POA: Insufficient documentation

## 2023-01-21 DIAGNOSIS — M5136 Other intervertebral disc degeneration, lumbar region: Secondary | ICD-10-CM | POA: Diagnosis present

## 2023-01-21 DIAGNOSIS — M545 Low back pain, unspecified: Secondary | ICD-10-CM | POA: Diagnosis present

## 2023-01-21 DIAGNOSIS — M25561 Pain in right knee: Secondary | ICD-10-CM | POA: Insufficient documentation

## 2023-01-21 DIAGNOSIS — M7062 Trochanteric bursitis, left hip: Secondary | ICD-10-CM | POA: Diagnosis present

## 2023-01-21 DIAGNOSIS — E039 Hypothyroidism, unspecified: Secondary | ICD-10-CM | POA: Diagnosis present

## 2023-01-21 DIAGNOSIS — M19041 Primary osteoarthritis, right hand: Secondary | ICD-10-CM | POA: Insufficient documentation

## 2023-01-21 DIAGNOSIS — M25562 Pain in left knee: Secondary | ICD-10-CM | POA: Diagnosis present

## 2023-01-21 DIAGNOSIS — M8589 Other specified disorders of bone density and structure, multiple sites: Secondary | ICD-10-CM | POA: Insufficient documentation

## 2023-01-21 DIAGNOSIS — G8929 Other chronic pain: Secondary | ICD-10-CM | POA: Diagnosis present

## 2023-01-21 DIAGNOSIS — M170 Bilateral primary osteoarthritis of knee: Secondary | ICD-10-CM | POA: Insufficient documentation

## 2023-01-21 DIAGNOSIS — M19042 Primary osteoarthritis, left hand: Secondary | ICD-10-CM | POA: Insufficient documentation

## 2023-01-21 DIAGNOSIS — Z79899 Other long term (current) drug therapy: Secondary | ICD-10-CM | POA: Insufficient documentation

## 2023-01-21 NOTE — Therapy (Signed)
OUTPATIENT PHYSICAL THERAPY LOWER EXTREMITY TREATMENT   Patient Name: [name] MRN: [id number] DOB:19-Apr-1946, 77 y.o., female Today's Date: 01/21/2023  END OF SESSION:  PT End of Session - 01/21/23 1424     Visit Number 8    Number of Visits 16    Date for PT Re-Evaluation 02/12/23    PT Start Time 1418    PT Stop Time 1500    PT Time Calculation (min) 42 min    Activity Tolerance Patient tolerated treatment well    Behavior During Therapy WFL for tasks assessed/performed              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   Sjogren's syndrome    Systemic lupus    Past Surgical History:  Procedure Laterality Date   APPENDECTOMY     BUNIONECTOMY  2008   right foot   CATARACT EXTRACTION     COLONOSCOPY  12/05/2016   Dr.Jacobs   COLONOSCOPY W/ POLYPECTOMY  08/27/2011   ENDOMETRIAL ABLATION     FOOT SURGERY Right 12/17/2017   bone spur   ingrown toenail Right 12/16/2018   LAPAROSCOPIC APPENDECTOMY  10/11/2011   Procedure: APPENDECTOMY LAPAROSCOPIC;  Surgeon: Adin Hector, MD;  Location: [facility];  Service: General;  Laterality: N/A;  Partial Cecectomy and Appendectomy   POLYPECTOMY     WISDOM TOOTH EXTRACTION     Patient Active Problem List   Diagnosis Date Noted   Encounter for counseling 09/09/2018   Osteopenia of multiple sites 08/22/2017   History of vitamin D deficiency 08/22/2017   Autoimmune disease 10/23/2016   Sjogren's syndrome 10/09/2016   Primary osteoarthritis of both hands 10/09/2016   Primary osteoarthritis of both knees 10/09/2016   High risk medication use 09/12/2016   SBO (small bowel obstruction), partial  postop 10/16/2011   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 She feels like her right is worse then the left.  PAIN:  Are you having pain? Yes: NPRS scale: 4/10 Pain location: Rt knee   Pain description: aching  Aggravating factors: Standing walking bending knees Relieving factors: Rest   PRECAUTIONS: Fall  WEIGHT BEARING RESTRICTIONS: No  FALLS:  Has patient fallen in last 6 months? Yes February 6th the patient fell coming out of a restaurant   LIVING ENVIRONMENT: Steps from the garage into the kitchen and a full basement with 13 steps   OCCUPATION: retired    PLOF: Independent was not using a  cane prior to the fall.   PATIENT GOALS: to be prepared to potential surgical procedure; to be more steady.   NEXT MD VISIT:   OBJECTIVE:   DIAGNOSTIC FINDINGS: X-RAYS SHOW BILATERAL OA   PATIENT SURVEYS:  FOTO    COGNITION: Overall cognitive status: Within functional limits for tasks assessed     SENSATION: WFL  EDEMA:    MUSCLE LENGTH:  POSTURE: No Significant postural limitations  PALPATION:   LOWER EXTREMITY ROM:  Passive ROM Right eval Left eval  Hip flexion    Hip extension    Hip abduction    Hip adduction    Hip internal rotation    Hip external rotation    Knee flexion Full Full  Knee extension -5 -3  Ankle dorsiflexion    Ankle plantarflexion    Ankle  inversion    Ankle eversion     (Blank rows = not tested)  LOWER EXTREMITY MMT:  MMT Right eval Left eval  Hip flexion 19.8 18.8  Hip extension    Hip abduction 23.1 21.1  Hip adduction    Hip internal rotation    Hip external rotation    Knee flexion    Knee extension 29.2 33  Ankle dorsiflexion    Ankle plantarflexion    Ankle inversion    Ankle eversion     (Blank rows = not tested)    FUNCTIONAL TESTS:    GAIT: Ambulating with cane on the right side initially.  Reviewed how to use a cane on the right left side to take pressure off the right side.  She did well with min cueing.  Decree single-leg stance time on the right compared to left  TODAY'S TREATMENT:                                                                                                                              DATE:   01/21/23 Pt seen for aquatic therapy today.  Treatment took place in water 3.25-4 ft in depth at the [facility] pool. Temp of water was 91.  Pt entered/exited the pool via stairs independently with bilat rail. * without support: walking forward/ backward * side stepping with straight knees ue rainbow HB with add/abd  * SLS - with light UE support. UE add/abd with rainbow HB x 10 R/L. Pt unable to maintain SLS throughout entire add/abd today    - 3 way toe touch x 10 each LE; hip flex/ext R/L alternating x10 *rainbow hb submerged at sides for core engagement forward and backward amb x2 lengths ea (difficult)  - forward and backward tandem with HB submerged * UE support rainbow HB:  heel raises;  toe raises; hip abdct/ addct; hip ext x12 *STS from 3rd step x 6. Excellent execution  * seated on bench:  flutter kick, cycling, and long sitting hip abdct/ add 2x20   Pt requires the buoyancy and hydrostatic pressure of water for support, and to  offload joints by unweighting joint load by at least 50 % in navel deep water and by at least 75-80% in chest to neck deep water.   Viscosity of the water is needed for resistance of strengthening. Water current perturbations provides challenge to standing balance requiring increased core activation.  01/17/23 Pt seen for aquatic therapy today.  Treatment took place in water 3.25-4 ft in depth at the [facility] pool. Temp of water was 91.  Pt entered/exited the pool via stairs independently with bilat rail. * without support: walking forward/ backward * side stepping with straight knees *side lunges x 2 width ( not tolerated well r knee) * SLS - with light UE support to no support, UE add/abd with rainbow HB x 10 R/L    - 3 way toe touch x 10 each LE; hip flex/ext R/L alternating x10 * holding wall:  heel toe raises; hip abdct/ addct; hip ext x12 *  quad stretch and hamstring stretch on step. Gentle self overpressure  into right knee ext * seated on bench:  flutter kick, cycling, and long sitting hip abdct/ add   Pt requires the buoyancy and hydrostatic pressure of water for support, and to offload joints by unweighting joint load by at least 50 % in navel deep water and by at least 75-80% in chest to neck deep water.  Viscosity of the water is needed for resistance of strengthening. Water current perturbations provides challenge to standing balance requiring increased core activation.   3/26 Nu step x5 mins L4 Quad set x20  SLR 2x10  Bridge 2x10  SAQ 2x10 1lbs LAQ 2x10 1lbs Heel Raises 2 x10 Standing hip abduction 2x10 bilateral Lateral and forward step ups 2 inch 1x10   Manual ; Grade I and II PA and AP mobilization for improved extension     3/19 Quad set x20  SLR 2x10  Bridge 2x10  SAQ 2x10 LAQ 2x10    Manual: PA and AP mobilization bilateral to improve extension; STM to IT band and quads. PROM into extension   3/13 Manual: PA and AP mobilization bilateral to improve extension; STM to IT band and quads. PROM into extension   Quad set x20  SLR 2x10  Bridge 2x10  Hip abduction 2x15 red    Standing heel raise x20  Standing march x20   PATIENT EDUCATION:  Education details: HEP, symptom management  Person educated: Patient Education method: Explanation, Demonstration, Tactile cues, Verbal cues, and Handouts Education comprehension: verbalized understanding, returned demonstration, verbal cues required, tactile cues required, and needs further education  HOME EXERCISE PROGRAM: Access Code: [email or web address] URL: [email or web address] Date: 12/18/2022 Prepared by: Carolyne Littles  Aquatics added 01/21/23 FZ: This aquatic home exercise program from [place] utilizes pictures from land based exercises, but has been adapted prior to lamination and issuance.    ASSESSMENT:  CLINICAL IMPRESSION:  Progressed SLS with decreasing UE support with execution.  Enhanced core engagement activities for improved core control with balance. Pt with difficulty completing  tandem stepping with core engaged. Created aquatic HEP [facility]) added to existing land based program.  Will plan on instructing and issuing next session for indep completion. Pt is a member here at [employer]. Goals ongoing    OBJECTIVE IMPAIRMENTS: Abnormal gait, decreased activity tolerance, decreased endurance, decreased mobility, difficulty walking, decreased ROM, decreased strength, and pain.   ACTIVITY LIMITATIONS: carrying, lifting, sitting, standing, stairs, transfers, and locomotion level  PARTICIPATION LIMITATIONS: meal prep, cleaning, laundry, driving, shopping, and yard work  PERSONAL FACTORS: 1-2  comorbidities: old thoracic compression fracture; lumbar spine pain   are also affecting patient's functional outcome.   REHAB POTENTIAL: good   CLINICAL DECISION MAKING: Evolving/moderate complexity bilateral knee pain increasing with time   EVALUATION COMPLEXITY: Moderate   GOALS: Goals reviewed with patient? Yes  SHORT TERM GOALS: Target date: 01/15/2023   Patient will be independent with basic  exercise program Baseline: Goal status: Met 01/21/23  2.  Patient will increase bilateral knee extension by 2 degrees Baseline:  Goal status: INITIAL  3.  Patient will increase gross bilateral lower extremity strength by 5 pounds Baseline:  Goal status: INITIAL   LONG TERM GOALS: Target date: 02/12/2023    Patient will stand for 1 hour without increased bilateral knee pain Baseline:  Goal status: INITIAL  2.  Patient will walk community distances without increased bilateral knee pain Baseline:  Goal status: INITIAL  3.  Patient will be independent with complete HEP for presurgical strengthening to optimize surgical outcome Baseline:  Goal status: INITIAL   PLAN:  PT FREQUENCY: 2x/week 1 time in pool 1 time on land  PT DURATION: 8 weeks  PLANNED INTERVENTIONS: Therapeutic exercises, Therapeutic activity, Neuromuscular re-education, Gait training, Patient/Family education, Self Care, Joint mobilization, Stair training, DME instructions, Aquatic Therapy, Dry Needling, Electrical stimulation, Cryotherapy, Moist heat, Taping, Ultrasound, and Manual therapy  PLAN FOR NEXT SESSION: Consider manual therapy to see if there is potential improved bilateral terminal knee extension.  Expand exercise programs acute close weightbearing exercises next visit consider standing hip 3-way,  consider standing march, consider standing heel raise.  Progressed to gym exercises if tolerated.  Stanton Kidney [place]) Haizley Cannella MPT 01/21/23 3:33 PM [facility] [address] [place], [place], [address] Phone: [phone]   Fax:  [phone] During this treatment session, the therapist was present, participating in and directing the treatment.

## 2023-01-22 ENCOUNTER — Ambulatory Visit: Payer: Medicare Other | Admitting: Physician Assistant

## 2023-01-22 ENCOUNTER — Encounter: Payer: Self-pay | Admitting: Physician Assistant

## 2023-01-22 VITALS — BP 120/75 | HR 68 | Resp 16 | Ht 65.25 in | Wt 150.0 lb

## 2023-01-22 DIAGNOSIS — M19041 Primary osteoarthritis, right hand: Secondary | ICD-10-CM

## 2023-01-22 DIAGNOSIS — M7062 Trochanteric bursitis, left hip: Secondary | ICD-10-CM | POA: Diagnosis not present

## 2023-01-22 DIAGNOSIS — Z79899 Other long term (current) drug therapy: Secondary | ICD-10-CM

## 2023-01-22 DIAGNOSIS — Z8659 Personal history of other mental and behavioral disorders: Secondary | ICD-10-CM

## 2023-01-22 DIAGNOSIS — M8589 Other specified disorders of bone density and structure, multiple sites: Secondary | ICD-10-CM

## 2023-01-22 DIAGNOSIS — E559 Vitamin D deficiency, unspecified: Secondary | ICD-10-CM

## 2023-01-22 DIAGNOSIS — M170 Bilateral primary osteoarthritis of knee: Secondary | ICD-10-CM

## 2023-01-22 DIAGNOSIS — M19042 Primary osteoarthritis, left hand: Secondary | ICD-10-CM

## 2023-01-22 DIAGNOSIS — S32010A Wedge compression fracture of first lumbar vertebra, initial encounter for closed fracture: Secondary | ICD-10-CM

## 2023-01-22 DIAGNOSIS — M5136 Other intervertebral disc degeneration, lumbar region: Secondary | ICD-10-CM

## 2023-01-22 DIAGNOSIS — M3501 Sicca syndrome with keratoconjunctivitis: Secondary | ICD-10-CM

## 2023-01-22 DIAGNOSIS — E039 Hypothyroidism, unspecified: Secondary | ICD-10-CM

## 2023-01-22 DIAGNOSIS — R5383 Other fatigue: Secondary | ICD-10-CM

## 2023-01-22 DIAGNOSIS — M4004 Postural kyphosis, thoracic region: Secondary | ICD-10-CM

## 2023-01-23 ENCOUNTER — Ambulatory Visit (HOSPITAL_BASED_OUTPATIENT_CLINIC_OR_DEPARTMENT_OTHER): Payer: Medicare Other | Admitting: Physical Therapy

## 2023-01-23 DIAGNOSIS — M545 Low back pain, unspecified: Secondary | ICD-10-CM

## 2023-01-23 DIAGNOSIS — M25561 Pain in right knee: Secondary | ICD-10-CM | POA: Diagnosis not present

## 2023-01-23 DIAGNOSIS — R2689 Other abnormalities of gait and mobility: Secondary | ICD-10-CM

## 2023-01-23 DIAGNOSIS — G8929 Other chronic pain: Secondary | ICD-10-CM

## 2023-01-23 NOTE — Therapy (Signed)
OUTPATIENT PHYSICAL THERAPY LOWER EXTREMITY TREATMENT   Patient Name: [name] MRN: [id number] DOB:1946-02-28, 77 y.o., female Today's Date: 01/15/2023  END OF SESSION:  PT End of Session - 01/15/23 0941     Visit Number 6    Number of Visits 16    Date for PT Re-Evaluation 02/12/23    PT Start Time 0845    PT Stop Time 0926    PT Time Calculation (min) 41 min    Activity Tolerance Patient tolerated treatment well    Behavior During Therapy [facility] for tasks assessed/performed            Progress Note Reporting Period 12/12/2022 to 01/23/2023  See note below for Objective Data and Assessment of Progress/Goals.      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   Sjogren's syndrome ([place])    Systemic lupus ([place])    Past Surgical History:  Procedure Laterality Date   APPENDECTOMY     BUNIONECTOMY  2008   right foot   CATARACT EXTRACTION     COLONOSCOPY  12/05/2016   Dr.Jacobs   COLONOSCOPY W/ POLYPECTOMY  08/27/2011   ENDOMETRIAL ABLATION     FOOT SURGERY Right 12/17/2017   bone spur   ingrown toenail Right 12/16/2018   LAPAROSCOPIC APPENDECTOMY  10/11/2011   Procedure: APPENDECTOMY LAPAROSCOPIC;  Surgeon: Adin Hector, MD;  Location: [facility];  Service: General;  Laterality: N/A;  Partial Cecectomy and Appendectomy   POLYPECTOMY     WISDOM TOOTH EXTRACTION     Patient Active Problem List   Diagnosis Date Noted   Encounter for counseling 09/09/2018   Osteopenia of multiple sites 08/22/2017   History of vitamin D deficiency 08/22/2017   Autoimmune disease (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    PERTINENT HISTORY: The patient has a long history of knee OA. She feels like her right is worse then the left.  PAIN:  Are you having pain? Yes: NPRS scale: 4/10 Pain location: Rt knee   Pain description: aching  Aggravating factors: Standing walking bending knees Relieving factors: Rest   PRECAUTIONS: Fall  WEIGHT BEARING RESTRICTIONS: No  FALLS:  Has patient fallen in last 6 months? Yes February 6th the patient fell coming out of a restaurant   LIVING ENVIRONMENT: Steps from the garage  into the kitchen and a full basement with 13 steps   OCCUPATION: retired    PLOF: Independent was not using a cane prior to the fall.   PATIENT GOALS: to be prepared to potential surgical procedure; to be more steady.   NEXT MD VISIT:   OBJECTIVE:   DIAGNOSTIC FINDINGS: X-RAYS SHOW BILATERAL OA   PATIENT SURVEYS:  FOTO    COGNITION: Overall cognitive status: Within functional limits for tasks assessed     SENSATION: WFL  EDEMA:    MUSCLE LENGTH:  POSTURE: No Significant postural limitations  PALPATION:   LOWER EXTREMITY ROM:  Passive ROM Right eval Left eval Right   Hip flexion     Hip extension     Hip abduction     Hip adduction     Hip  internal rotation     Hip external rotation     Knee flexion Full Full   Knee extension -5 -3   Ankle dorsiflexion     Ankle plantarflexion     Ankle inversion     Ankle eversion      (Blank rows = not tested)  LOWER EXTREMITY MMT:  MMT Right eval Left eval Right Left  Hip flexion 19.8 18.8 23.2 24.5  Hip extension      Hip abduction 23.1 21.1 25.2 27.1  Hip adduction      Hip internal rotation      Hip external rotation      Knee flexion      Knee extension 29.2 33 31.5 36.7  Ankle dorsiflexion      Ankle plantarflexion      Ankle inversion      Ankle eversion       (Blank rows = not tested)    FUNCTIONAL TESTS:    GAIT: Ambulating with cane on the right side initially.  Reviewed how to use a cane on the right left side to take pressure off the right side.  She did well with min cueing.  Decree single-leg stance time on the right compared to left  TODAY'S TREATMENT:                                                                                                                              DATE:  4/3 Nu step x52mins L3 Quad set x20 3 Bridge 2x10  SAQ 2x10 1lbs  Manual ; Grade I and II PA and AP mobilization for improved extension   3/26 Nu step x5 mins L4 Quad set x20  SLR 2x10  Bridge 2x10  SAQ 2x10 1lbs LAQ 2x10 1lbs Heel Raises 2 x10 Standing hip abduction 2x10 bilateral Lateral and forward step ups 2 inch 1x10   Manual ; Grade I and II PA and AP mobilization for improved extension  3/22 Pt seen for aquatic therapy today.  Treatment took place in water 3.25-4 ft in depth at the [facility] pool. Temp of water was 91.  Pt entered/exited  the pool via stairs independently with bilat rail. * without support: walking forward/ backward * side stepping with straight knees and then with increased step height -2 laps * forward/backward marching - 1 lap  * SLS - with light UE support to no support,3 way toe touch x 10 each LE * holding wall:  heel  toe raises; hip abdct/ addct x 10 each * forward step up/retro step down x 10 each LE;  forward step down/retro step up x 5 each LE * seated on bench:  flutter kick, cycling, and long sitting hip abdct/ add  * STS with feet on blue step in water x 10, with cues for forward arm reach  *  quad stretch with foot on bench with opp foot on blue step,  hamstring stretch with DF, foot on 2nd step-- 15s x 2 each LE  Pt requires the buoyancy and hydrostatic pressure of water for support, and to offload joints by unweighting joint load by at least 50 % in navel deep water and by at least 75-80% in chest to neck deep water.  Viscosity of the water is needed for resistance of strengthening. Water current perturbations provides challenge to standing balance requiring increased core activation.   3/19 Quad set x20  SLR 2x10  Bridge 2x10  SAQ 2x10 LAQ 2x10    Manual: PA and AP mobilization bilateral to improve extension; STM to IT band and quads. PROM into extension   3/13 Manual: PA and AP mobilization bilateral to improve extension; STM to IT band and quads. PROM into extension   Quad set x20  SLR 2x10  Bridge 2x10  Hip abduction 2x15 red   Standing heel raise x20  Standing march x20   PATIENT EDUCATION:  Education details: HEP, symptom management  Person educated: Patient Education method: Explanation, Demonstration, Tactile cues, Verbal cues, and Handouts Education comprehension: verbalized understanding, returned demonstration, verbal cues required, tactile cues required, and needs further education  HOME EXERCISE PROGRAM: Access Code: [email or web address] URL: [email or web address] Date: 12/18/2022 Prepared by: Carolyne Littles  ASSESSMENT:  CLINICAL IMPRESSION: The patient is progressing well. Her strength numbers have improved. Her knee extension has improved. She continues to have pain but it is less limiting to her daily tasks. She had pain at baseline today but felt better  after her manual therapy and exercises. Therapy will continue 2w6 to finalize her prehab program and continue with aquatic strengthening.   OBJECTIVE IMPAIRMENTS: Abnormal gait, decreased activity tolerance, decreased endurance, decreased mobility, difficulty walking, decreased ROM, decreased strength, and pain.   ACTIVITY LIMITATIONS: carrying, lifting, sitting, standing, stairs, transfers, and locomotion level  PARTICIPATION LIMITATIONS: meal prep, cleaning, laundry, driving, shopping, and yard work  PERSONAL FACTORS: 1-2 comorbidities: old thoracic compression fracture; lumbar spine pain   are also affecting patient's functional outcome.   REHAB POTENTIAL: good   CLINICAL DECISION MAKING: Evolving/moderate complexity bilateral knee pain increasing with time   EVALUATION COMPLEXITY: Moderate   GOALS: Goals reviewed with patient? Yes  SHORT TERM GOALS: Target date: 01/15/2023   Patient will be independent with basic exercise program Baseline: Goal status: achieved has base exercise program 4/3  2.  Patient will increase bilateral knee extension by 2 degrees Baseline:  Goal status:achieved -3 4/3 3.  Patient will increase gross bilateral lower extremity strength by 5 pounds Baseline:  Goal status: progressing 4/3   LONG TERM GOALS: Target date: 02/12/2023    Patient will stand for 1 hour without increased bilateral knee pain Baseline:  Goal status:  progressing 4/3 2.  Patient will walk community distances without increased bilateral knee pain Baseline:  Goal status: continues to have pain 4/3  3.  Patient will be independent with complete HEP for presurgical strengthening to optimize surgical outcome Baseline:  Goal status: INITIAL   PLAN:  PT FREQUENCY: 2x/week 1 time in pool 1 time on land  PT DURATION: 6 weeks   PLANNED INTERVENTIONS: Therapeutic exercises, Therapeutic activity, Neuromuscular re-education, Gait training, Patient/Family education, Self Care,  Joint mobilization, Stair training, DME instructions, Aquatic Therapy, Dry Needling, Electrical stimulation, Cryotherapy, Moist heat, Taping, Ultrasound, and Manual therapy  PLAN FOR NEXT SESSION: Consider manual therapy to see if there is potential improved bilateral terminal knee extension.  Expand exercise programs acute closed chain weightbearing exercises next visit consider standing hip 3-way,  consider standing march, consider standing heel raise.  Progressed to gym exercises if tolerated.  Carolyne Littles PT DPT  01/15/23 6:26 PM [facility] [address] [place], [place], [address] Phone: [phone]   Fax:  [phone] During this treatment session, the therapist was present, participating in and directing the treatment.    Jaci Standard SPT  During this treatment session, the therapist was present, participating in and directing the treatment.

## 2023-01-30 ENCOUNTER — Ambulatory Visit (HOSPITAL_BASED_OUTPATIENT_CLINIC_OR_DEPARTMENT_OTHER): Payer: Medicare Other | Admitting: Physical Therapy

## 2023-01-31 ENCOUNTER — Encounter (HOSPITAL_BASED_OUTPATIENT_CLINIC_OR_DEPARTMENT_OTHER): Payer: Self-pay | Admitting: Physical Therapy

## 2023-01-31 ENCOUNTER — Ambulatory Visit (HOSPITAL_BASED_OUTPATIENT_CLINIC_OR_DEPARTMENT_OTHER): Payer: Medicare Other | Admitting: Physical Therapy

## 2023-01-31 DIAGNOSIS — R2689 Other abnormalities of gait and mobility: Secondary | ICD-10-CM

## 2023-01-31 DIAGNOSIS — M25561 Pain in right knee: Secondary | ICD-10-CM | POA: Diagnosis not present

## 2023-01-31 DIAGNOSIS — G8929 Other chronic pain: Secondary | ICD-10-CM

## 2023-01-31 NOTE — Therapy (Signed)
OUTPATIENT PHYSICAL THERAPY LOWER EXTREMITY TREATMENT   Patient Name: [name] MRN: [id number] DOB:04/07/1946, 77 y.o., female Today's Date: 01/31/2023  END OF SESSION:  PT End of Session - 01/31/23 1535     Visit Number 10    Number of Visits 21    Date for PT Re-Evaluation 03/06/23    Progress Note Due on Visit 19    PT Start Time 1531    PT Stop Time 1610    PT Time Calculation (min) 39 min    [facility] assessed/performed            Progress Note Reporting Period 12/12/2022 to 01/23/2023  See note below for Objective Data and Assessment of Progress/Goals.      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   BILIARY DYSKINESIA, 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% 07/20/2009    PCP: Dr Gweneth DimitriWendy Mcneill   REFERRING PROVIDER:Dr Gweneth DimitriWendy Mcneill    REFERRING DIAG:  Diagnosis  M25.561 (ICD-10-CM) - Pain in right knee    THERAPY DIAG:  Chronic pain of right knee  Chronic pain of left knee  Other abnormalities of gait and mobility  Rationale for Evaluation and Treatment: Rehabilitation  ONSET DATE: increased pain since January   SUBJECTIVE:   SUBJECTIVE STATEMENT:  "I am more aware of the rubbing in my Rt knee so that just reinforces I am ready for the replacement."    Pt reports she has had a house guest over last week and hasn't had must time to do her exercises.      PERTINENT HISTORY: The patient has a long history of knee OA. She feels like her right is worse then the left.  PAIN:  Are you having pain? Yes: NPRS scale: 5/10 Pain location: Rt knee   Pain description: aching  Aggravating factors: Standing walking bending knees Relieving factors: Rest   PRECAUTIONS: Fall  WEIGHT BEARING  RESTRICTIONS: No  FALLS:  Has patient fallen in last 6 months? Yes February 6th the patient fell coming out of a restaurant   LIVING ENVIRONMENT: Steps from the garage into the kitchen and a full basement with 13 steps   OCCUPATION: retired    PLOF: Independent was not using a cane prior to the fall.   PATIENT GOALS: to be prepared to potential surgical procedure; to be more steady.   NEXT MD VISIT:   OBJECTIVE:   DIAGNOSTIC FINDINGS: X-RAYS SHOW BILATERAL OA   PATIENT SURVEYS:  FOTO    COGNITION: Overall cognitive status: Within functional limits for tasks assessed     SENSATION: WFL  EDEMA:    MUSCLE LENGTH:  POSTURE: No Significant postural limitations  PALPATION:   LOWER EXTREMITY ROM:  Passive ROM Right eval  Left eval Right   Hip flexion     Hip extension     Hip abduction     Hip adduction     Hip internal rotation     Hip external rotation     Knee flexion Full Full   Knee extension -5 -3   Ankle dorsiflexion     Ankle plantarflexion     Ankle inversion     Ankle eversion      (Blank rows = not tested)  LOWER EXTREMITY MMT:  MMT Right eval Left eval Right Left  Hip flexion 19.8 18.8 23.2 24.5  Hip extension      Hip abduction 23.1 21.1 25.2 27.1  Hip adduction      Hip internal rotation      Hip external rotation      Knee flexion      Knee extension 29.2 33 31.5 36.7  Ankle dorsiflexion      Ankle plantarflexion      Ankle inversion      Ankle eversion       (Blank rows = not tested)    FUNCTIONAL TESTS:    GAIT: Ambulating with cane on the right side initially.  Reviewed how to use a cane on the right left side to take pressure off the right side.  She did well with min cueing.  Decree single-leg stance time on the right compared to left  TODAY'S TREATMENT:                                                                                                                              DATE:  4/11 Pt seen for aquatic therapy today.  Treatment took place in water 3.25-4 ft in depth at the [facility] pool. Temp of water was 91.  Pt entered/exited the pool via stairs independently with bilat rail. * without support: walking forward/ backward - cues for TKE with backward gait *holding noodle: side stepping with straight knees and then with increased step height -2 laps; marching forward/ backwards * forward step ups onto blue 5" step x 10 each LE, RUE support * seated  on bench:   flutter kick, cycling, and long sitting hip abdct/ add x2 rounds;  alternating LAQ with DF * hamstring stretch with foot on 3rd step x 15s x 2 * heel raises with heels off of step x5, then gastroc stretch 20s  - 2 rounds * SLS - with no support,3 way toe touch x 5 each LE x 3 * tandem  gait forward/ backward with rainbow hand floats - 2 laps  * walking forward with single / bilat rainbow hand floats under water at side  Pt requires the buoyancy and hydrostatic pressure of water for support, and to offload joints by unweighting joint load by at least 50 % in navel deep water and by at least 75-80% in chest to neck deep water.  Viscosity of the water is needed for resistance of strengthening. Water current perturbations provides challenge to standing balance requiring increased core activation.  4/3 Nu step x10mins L3 Quad set x20 3 Bridge 2x10  SAQ 2x10 1lbs  Manual ; Grade I and II PA and AP mobilization for improved extension   3/26 Nu step x5 mins L4 Quad set x20  SLR 2x10  Bridge 2x10  SAQ 2x10 1lbs LAQ 2x10 1lbs Heel Raises 2 x10 Standing hip abduction 2x10 bilateral Lateral and forward step ups 2 inch 1x10   Manual ; Grade I and II PA and AP mobilization for improved extension   PATIENT EDUCATION:  Education details: exercise progression/ modifications Person educated: Patient Education method: Explanation, Demonstration, Tactile cues, Verbal cues, and Handouts Education comprehension: verbalized understanding, returned demonstration, verbal cues required, tactile cues required, and needs further education  HOME EXERCISE PROGRAM: Access Code: [email or web address] URL: [email or web address] Date: 12/18/2022 Prepared by: Lorayne Bender  AQUATIC Access Code: [email or web address] URL: [email or web address] Date: 01/31/2023 Prepared by: [facility] Notes * Seated on bench:  bicycle,  scissor legs,  alternating knee bends, and flutter kick  Exercises - Forward and Backward Walking with Hand Floats  - 1 x daily - 7 x weekly - Side Stepping with Hand Floats  - 1 x daily - 7 x weekly - Seated Long Arc Quad  - 1 x daily - 7 x weekly - 1 sets - 10 reps - Walking Tandem Stance  - 1 x daily - 7 x weekly -  Standing Hip Abduction  - 1 x daily - 7 x weekly - 1-2 sets - 10 reps - Standing Knee Flexion AROM with Chair Support  - 1 x daily - 7 x weekly - 1-2 sets - 10 reps - Standing 3-Way Leg Reach  - 1 x daily - 7 x weekly - 1-3 sets - 5 reps - Forward Step Up with Unilateral Counter Support  - 1 x daily - 7 x weekly - 1-2 sets - 10 reps - Standing Hamstring Stretch on Chair  - 2 x daily - 7 x weekly - 2 reps - 20 seconds  hold - Quadricep Stretch with Chair and Counter Support  - 1 x daily - 7 x weekly - 2-3 reps - 20 seconds  hold - Gastroc Stretch on Wall  - 1 x daily - 7 x weekly - 2 reps - 20 seconds  hold ASSESSMENT:  CLINICAL IMPRESSION: Pt reported mild increase in knee pain with marching. Pain remained 5/10 throughout session, however pt reported good tolerance to all exercises.   Pt progressing well towards remaining goals. Pt will be given laminated aquatic HEP at next land visit.  Pt to reach out if any questions.    OBJECTIVE IMPAIRMENTS: Abnormal gait, decreased activity tolerance, decreased endurance, decreased mobility, difficulty walking, decreased ROM, decreased strength, and pain.   ACTIVITY LIMITATIONS: carrying, lifting, sitting, standing, stairs, transfers, and locomotion level  PARTICIPATION LIMITATIONS: meal prep, cleaning, laundry, driving, shopping, and yard work  PERSONAL FACTORS: 1-2 comorbidities: old thoracic compression fracture; lumbar spine pain   are also affecting patient's functional outcome.   REHAB POTENTIAL: good   CLINICAL DECISION MAKING: Evolving/moderate complexity bilateral knee pain increasing with time   EVALUATION COMPLEXITY: Moderate   GOALS: Goals reviewed with patient? Yes  SHORT TERM GOALS: Target date: 01/15/2023   Patient will be independent with basic exercise program Baseline: Goal status: achieved has base exercise program 4/3  2.  Patient will increase bilateral knee extension by 2 degrees Baseline:  Goal status:achieved -3  4/3 3.  Patient will increase gross bilateral lower extremity strength by 5 pounds Baseline:  Goal status: progressing 4/3   LONG TERM GOALS: Target date: 02/12/2023    Patient will stand for 1 hour without increased bilateral knee pain Baseline:  Goal status: progressing 4/3  2.  Patient will walk community distances without increased bilateral knee pain Baseline:  Goal status: continues to have pain 4/3  3.  Patient will be independent with complete HEP for presurgical strengthening to optimize surgical outcome Baseline:  Goal status: INITIAL   PLAN:  PT FREQUENCY: 2x/week 1 time in pool 1 time on land  PT DURATION: 6 weeks   PLANNED INTERVENTIONS: Therapeutic exercises, Therapeutic activity, Neuromuscular re-education, Gait training, Patient/Family education, Self Care, Joint mobilization, Stair training, DME instructions, Aquatic Therapy, Dry Needling, Electrical stimulation, Cryotherapy, Moist heat, Taping, Ultrasound, and Manual therapy  PLAN FOR NEXT SESSION:   Expand exercise program and Progressed to gym exercises if tolerated.  Mayer Camel, PTA 01/31/23 6:33 PM [facility] [address] [place], [place], [address] Phone: [phone]   Fax:  [phone]

## 2023-02-01 ENCOUNTER — Ambulatory Visit (HOSPITAL_BASED_OUTPATIENT_CLINIC_OR_DEPARTMENT_OTHER): Payer: Medicare Other | Admitting: Physical Therapy

## 2023-02-01 DIAGNOSIS — G8929 Other chronic pain: Secondary | ICD-10-CM

## 2023-02-01 DIAGNOSIS — R2689 Other abnormalities of gait and mobility: Secondary | ICD-10-CM

## 2023-02-01 NOTE — Therapy (Signed)
OUTPATIENT PHYSICAL THERAPY LOWER EXTREMITY TREATMENT/Discharge    Patient Name: [name] MRN: [id number] DOB:Nov 04, 1945, 77 y.o., female Today's Date: 01/31/2023  END OF SESSION:  PT End of Session - 01/31/23 1535     Visit Number 10    Number of Visits 21    Date for PT Re-Evaluation 03/06/23    Progress Note Due on Visit 19    PT Start Time 1531    PT Stop Time 1610    PT Time Calculation (min) 39 min    Behavior During Therapy [facility] for tasks assessed/performed            Progress Note Reporting Period 12/12/2022 to 01/23/2023  See note below for Objective Data and Assessment of Progress/Goals.      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   Sjogren's syndrome    Systemic lupus    Past Surgical History:  Procedure Laterality Date   APPENDECTOMY     BUNIONECTOMY  2008   right foot   CATARACT EXTRACTION     COLONOSCOPY  12/05/2016   Dr.Jacobs   COLONOSCOPY W/ POLYPECTOMY  08/27/2011   ENDOMETRIAL ABLATION     FOOT SURGERY Right 12/17/2017   bone spur   ingrown toenail Right 12/16/2018   LAPAROSCOPIC APPENDECTOMY  10/11/2011   Procedure: APPENDECTOMY LAPAROSCOPIC;  Surgeon: Ardeth Sportsman, MD;  Location: [facility];  Service: General;  Laterality: N/A;  Partial Cecectomy and Appendectomy   POLYPECTOMY     WISDOM TOOTH EXTRACTION     Patient Active Problem List   Diagnosis Date Noted   Encounter for counseling 09/09/2018   Osteopenia of multiple sites 08/22/2017   History of vitamin D deficiency 08/22/2017   Autoimmune disease 10/23/2016   Sjogren's syndrome 10/09/2016   Primary osteoarthritis of both hands 10/09/2016   Primary  osteoarthritis of both knees 10/09/2016   High risk medication use 09/12/2016   SBO (small bowel obstruction), partial postop 10/16/2011   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   BILIARY DYSKINESIA, 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% 07/20/2009    PCP: Dr Gweneth Dimitri   REFERRING PROVIDER:Dr Gweneth Dimitri    REFERRING DIAG:  Diagnosis  M25.561 (ICD-10-CM) - Pain in right knee    THERAPY DIAG:  Chronic pain of right knee  Chronic pain of left knee  Other abnormalities of gait and mobility  Rationale for Evaluation and Treatment: Rehabilitation  ONSET DATE: increased pain since January   SUBJECTIVE:   SUBJECTIVE STATEMENT:  "I am more aware of the rubbing in my Rt knee so that just reinforces I am ready for the replacement."    Pt reports she has had a house guest over last week and hasn't had must time to do her exercises.      PERTINENT HISTORY: The patient has a long history of knee OA. She feels like her right is worse then the left.  PAIN:  Are you having pain? Yes: NPRS scale: 5/10 Pain location: Rt knee   Pain description: aching  Aggravating factors: Standing walking bending knees Relieving factors: Rest   PRECAUTIONS: Fall  WEIGHT  BEARING RESTRICTIONS: No  FALLS:  Has patient fallen in last 6 months? Yes February 6th the patient fell coming out of a restaurant   LIVING ENVIRONMENT: Steps from the garage into the kitchen and a full basement with 13 steps   OCCUPATION: retired    PLOF: Independent was not using a cane prior to the fall.   PATIENT GOALS: to be prepared to potential surgical procedure; to be more steady.   NEXT MD VISIT:   OBJECTIVE:   DIAGNOSTIC FINDINGS: X-RAYS SHOW BILATERAL OA   PATIENT SURVEYS:  FOTO    COGNITION: Overall cognitive status: Within functional limits for tasks assessed     SENSATION: WFL  EDEMA:    MUSCLE LENGTH:  POSTURE: No Significant postural limitations  PALPATION:   LOWER EXTREMITY ROM:  Passive  ROM Right eval Left eval Right   Hip flexion     Hip extension     Hip abduction     Hip adduction     Hip internal rotation     Hip external rotation     Knee flexion Full Full   Knee extension -5 -3   Ankle dorsiflexion     Ankle plantarflexion     Ankle inversion     Ankle eversion      (Blank rows = not tested)  LOWER EXTREMITY MMT:  MMT Right eval Left eval Right Left  Hip flexion 19.8 18.8 23.2 24.5  Hip extension      Hip abduction 23.1 21.1 25.2 27.1  Hip adduction      Hip internal rotation      Hip external rotation      Knee flexion      Knee extension 29.2 33 31.5 36.7  Ankle dorsiflexion      Ankle plantarflexion      Ankle inversion      Ankle eversion       (Blank rows = not tested)    FUNCTIONAL TESTS:    GAIT: Ambulating with cane on the right side initially.  Reviewed how to use a cane on the right left side to take pressure off the right side.  She did well with min cueing.  Decree single-leg stance time on the right compared to left  TODAY'S TREATMENT:                                                                                                                              DATE:   4/12  [employer] leg press 15 lbs 3x15  [employer] Row machine 30 lbs 3x15  [employer] Tricpes press down 40 lbs   [employer]bex leg press 3x15 Seat 4 Back 3 40 lbs   Manual: Manual ; Grade I and II PA and AP mobilization for improved extension   4/11 Pt seen for aquatic therapy today.  Treatment took place in water 3.25-4 ft in depth at the [facility] pool. Temp of water was 91.  Pt entered/exited the pool via stairs independently with bilat rail. * without support: walking forward/ backward - cues for TKE with backward gait *holding noodle: side stepping with straight knees and then with increased step height -2 laps; marching forward/ backwards * forward step ups onto blue 5" step x 10 each LE, RUE support * seated on bench:   flutter kick,  cycling, and long sitting hip abdct/ add x2 rounds;  alternating LAQ with DF * hamstring stretch with foot on 3rd step x 15s x 2 * heel raises with heels off of step x5, then gastroc stretch 20s  - 2 rounds * SLS - with no support,3 way toe touch x 5 each LE x 3 * tandem gait forward/ backward with rainbow hand floats - 2 laps  * walking forward with single / bilat rainbow hand floats under water at side  Pt requires the buoyancy and hydrostatic pressure of water for support, and to offload joints by unweighting joint load by at least 50 % in navel deep water and by at least 75-80% in chest to neck deep water.  Viscosity of the water is needed for resistance of strengthening. Water current perturbations provides challenge to standing balance requiring increased core activation.  4/3 Nu step x56mins L3 Quad set x20 3 Bridge 2x10  SAQ 2x10 1lbs  Manual ; Grade I and II PA and AP mobilization for improved extension   3/26 Nu step x5 mins L4 Quad set x20  SLR 2x10  Bridge 2x10  SAQ 2x10 1lbs LAQ 2x10 1lbs Heel Raises 2 x10 Standing hip abduction 2x10 bilateral Lateral and forward step ups 2 inch 1x10   Manual ; Grade I and II PA and AP mobilization for improved extension   PATIENT EDUCATION:  Education details: exercise progression/ modifications/ final HEP  Person educated: Patient Education method: Explanation, Demonstration, Tactile cues, Verbal cues, and Handouts Education comprehension: verbalized understanding, returned demonstration, verbal cues required, tactile cues required, and needs further education  HOME EXERCISE PROGRAM: Access Code: [email or web address] URL: [email or web address] Date: 12/18/2022 Prepared by: Lorayne Bender  AQUATIC Access Code: [email or web address] URL: [email or web address] Date: 01/31/2023 Prepared by: [facility] Notes * Seated on bench:  bicycle,  scissor legs,  alternating knee bends, and  flutter kick  Exercises - Forward and Backward Walking with Hand Floats  - 1 x daily - 7 x weekly - Side Stepping with Hand Floats  - 1 x daily - 7 x weekly - Seated Long Arc Quad  - 1 x daily - 7 x weekly - 1 sets - 10 reps - Walking Tandem Stance  - 1 x daily - 7 x weekly - Standing Hip Abduction  - 1 x daily - 7 x weekly - 1-2 sets - 10 reps - Standing Knee Flexion AROM with Chair Support  - 1 x daily - 7 x weekly - 1-2 sets - 10 reps - Standing 3-Way Leg Reach  - 1 x daily - 7 x weekly - 1-3 sets - 5 reps - Forward Step Up with Unilateral Counter Support  - 1 x daily - 7 x weekly - 1-2 sets - 10 reps - Standing Hamstring Stretch on Chair  - 2 x daily - 7 x weekly - 2 reps - 20 seconds  hold - Quadricep Stretch with Chair and Counter Support  - 1 x daily - 7 x weekly - 2-3 reps - 20 seconds  hold - Gastroc Stretch on Wall  -  1 x daily - 7 x weekly - 2 reps - 20 seconds  hold ASSESSMENT:  CLINICAL IMPRESSION: Reviewed patient HEP and demonstrated safety and correct technique. Patient was given a full aquatic and land HEP that she will be doing for the next couple of months before surgery. Patient has improve knee extension over her POC and has increase LE strength. Patient discharged but was informed to contact therapist with any questions she has.    OBJECTIVE IMPAIRMENTS: Abnormal gait, decreased activity tolerance, decreased endurance, decreased mobility, difficulty walking, decreased ROM, decreased strength, and pain.   ACTIVITY LIMITATIONS: carrying, lifting, sitting, standing, stairs, transfers, and locomotion level  PARTICIPATION LIMITATIONS: meal prep, cleaning, laundry, driving, shopping, and yard work  PERSONAL FACTORS: 1-2 comorbidities: old thoracic compression fracture; lumbar spine pain   are also affecting patient's functional outcome.   REHAB POTENTIAL: good   CLINICAL DECISION MAKING: Evolving/moderate complexity bilateral knee pain increasing with time   EVALUATION  COMPLEXITY: Moderate   GOALS: Goals reviewed with patient? Yes  SHORT TERM GOALS: Target date: 01/15/2023   Patient will be independent with basic exercise program Baseline: Goal status: achieved has base exercise program 4/3  2.  Patient will increase bilateral knee extension by 2 degrees Baseline:  Goal status:achieved -3 4/12 3.  Patient will increase gross bilateral lower extremity strength by 5 pounds Baseline:  Goal status: progressing 4/12    LONG TERM GOALS: Target date: 02/12/2023    Patient will stand for 1 hour without increased bilateral knee pain Baseline:  Goal status: progressing 4/3  2.  Patient will walk community distances without increased bilateral knee pain Baseline:  Goal status: continues to have pain 4/12  3.  Patient will be independent with complete HEP for presurgical strengthening to optimize surgical outcome Baseline:  Goal status: 4/12    PLAN:  PT FREQUENCY: 2x/week 1 time in pool 1 time on land  PT DURATION: 6 weeks   PLANNED INTERVENTIONS: Therapeutic exercises, Therapeutic activity, Neuromuscular re-education, Gait training, Patient/Family education, Self Care, Joint mobilization, Stair training, DME instructions, Aquatic Therapy, Dry Needling, Electrical stimulation, Cryotherapy, Moist heat, Taping, Ultrasound, and Manual therapy  PLAN FOR NEXT SESSION:    Patient has no more appointment, patient was discharge but was informed that she can contact MD a needed.     Lorayne Bender PT DPT  01/31/23 6:33 PM [facility] [address] [place], [place], [address] Phone: [phone]   Fax:  [phone]

## 2023-02-08 NOTE — Telephone Encounter (Signed)
There is no need to stop Plaquenil unless you have an infection.  The recommendations are to do the dental work just prior to next Prolia injection.  But if it is an emergency then you may proceed with the procedure.

## 2023-02-10 ENCOUNTER — Encounter: Payer: Self-pay | Admitting: Rheumatology

## 2023-03-13 ENCOUNTER — Telehealth: Payer: Self-pay

## 2023-03-13 ENCOUNTER — Encounter: Payer: Self-pay | Admitting: Rheumatology

## 2023-03-13 NOTE — Telephone Encounter (Signed)
Notified patient via mychart

## 2023-03-13 NOTE — Telephone Encounter (Signed)
Patient advised we do not recommend stopping hydroxychloroquine for knee surgery.  The irritation in the mouth appears to be related to the mouth rinse.  Patient states since we do not recommend she stop the PLQ and Dr. Lequita Halt wants her to stop 2 weeks prior to surgery, which directions should she follow? Please advise.

## 2023-03-13 NOTE — H&P (Addendum)
TOTAL KNEE ADMISSION H&P  Patient is being admitted for right total knee arthroplasty.  Subjective:  Chief Complaint: Right knee pain.  HPI: [name], 77 y.o. female has a history of pain and functional disability in the right knee due to arthritis and has failed non-surgical conservative treatments for greater than 12 weeks to include NSAID's and/or analgesics, corticosteriod injections, viscosupplementation injections, and activity modification. Onset of symptoms was gradual, starting several years ago with gradually worsening course since that time. The patient noted no past surgery on the right knee.  Patient currently rates pain in the right knee at 7 out of 10 with activity. Patient has night pain, worsening of pain with activity and weight bearing, and pain that interferes with activities of daily living. Patient has evidence of  bone-on-bone in the lateral compartment on that lateral view. She also has significant patellofemoral spurring and narrowing  by imaging studies. There is no active infection.  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    Past Medical History:  Diagnosis Date   Allergy    Atherosclerosis    Colon polyp    Compression fracture of L1 lumbar vertebra (HCC)    dx by ortho   Environmental allergies    Hypothyroid    Osteoarthritis    Osteoarthritis    Osteoarthritis    Osteopenia    Plantar fasciitis, right    Raynauds phenomenon    Serrated adenoma  of appendiceal oriface 09/10/2011   Sjogren's syndrome (HCC)    Systemic lupus (HCC)     Past Surgical History:  Procedure Laterality Date   APPENDECTOMY     BUNIONECTOMY  2008   right foot   CATARACT EXTRACTION     COLONOSCOPY  12/05/2016   Dr.Jacobs   COLONOSCOPY W/ POLYPECTOMY  08/27/2011   ENDOMETRIAL ABLATION     FOOT SURGERY Right 12/17/2017   bone spur   ingrown toenail Right 12/16/2018   LAPAROSCOPIC APPENDECTOMY  10/11/2011   Procedure: APPENDECTOMY LAPAROSCOPIC;  Surgeon: Ardeth Sportsman, MD;  Location: [facility];  Service: General;  Laterality: N/A;  Partial Cecectomy and Appendectomy   POLYPECTOMY     WISDOM TOOTH EXTRACTION      Prior to Admission medications   Medication Sig Start Date End Date Taking? Authorizing Provider  acetaminophen (TYLENOL) 650 MG CR tablet  11/03/21   [provider]  aspirin 81 MG tablet Take 81 mg by mouth at bedtime.    [provider]  calcium carbonate (OS-CAL) 600 MG tablet 1 tablet with meals    [provider]  Cholecalciferol (VITAMIN D) 2000 units CAPS Take by mouth daily.    [provider]  denosumab (PROLIA) 60 MG/ML SOSY injection Inject 60 mg into the skin every 6 (six) months.    [provider]  docusate sodium (COLACE) 100 MG capsule Take 1 capsule every day by oral route.    [provider]  escitalopram (LEXAPRO) 10 MG tablet TAKE 1 TABLET BY MOUTH DAILY 12/19/22  Gearldine Bienenstock, PA-C  fexofenadine (ALLEGRA) 180 MG tablet Take 180 mg by mouth daily.    [provider]  fluticasone Aleda Grana) 50 MCG/ACT nasal spray 2 sprays in each nostril 09/03/13   [provider]  folic acid (FOLVITE) 1 MG tablet Take 1 mg by mouth daily. 08/12/11   [provider]  hydroxychloroquine (PLAQUENIL) 200 MG tablet TAKE 1 TABLET BY MOUTH DAILY 11/19/22   Gearldine Bienenstock, PA-C  ipratropium (ATROVENT) 0.03 % nasal spray 2 SPRAYS IN EACH NOSTRIL NASALLY UP TO FOUR TIMES A  DAY 30 DAYS 09/08/22   [provider]  levothyroxine (SYNTHROID, LEVOTHROID) 88 MCG tablet Take 88 mcg by mouth every morning.    [provider]  Multiple Vitamin (MULTIVITAMIN) tablet Take 1 tablet by mouth daily.    [provider]  Omega-3 Fatty Acids (FISH OIL) 1000 MG CAPS     [provider]  rosuvastatin (CRESTOR) 10 MG tablet Take by mouth. 07/18/21   [provider]  Sennosides (SENOKOT PO)     [provider]  Thiamine HCl (VITAMIN B-1) 100 MG tablet Take 100 mg by mouth daily.    [provider]  traMADol (ULTRAM) 50 MG tablet tramadol 50 mg tablet  TAKE 1 TABLET BY MOUTH 3 TIMES DAILY AS NEEDED (INS PAYS FOR 7 DAY SUPPLY)    [provider]  triamcinolone cream (KENALOG) 0.1 %  03/01/14   [provider]  trimethoprim (TRIMPEX) 100 MG tablet Take 100 mg by mouth daily.    [provider]  TURMERIC PO Take 1 capsule by mouth daily.     [provider]  zolpidem (AMBIEN) 5 MG tablet Take 5 mg by mouth at bedtime as needed for sleep.    [provider]    Allergies  Allergen Reactions   Boniva [Ibandronic Acid] Other (See Comments)    Pain in legs   Latex Itching   Sulfonamide Derivatives     REACTION: headache   Bacitracin-Polymyxin B Rash    Social History   Socioeconomic History   Marital status: Married    Spouse name: Not on file   Number of children: Not on file   Years of education: Not on file   Highest education level: Not on file  Occupational History   Not on file  Tobacco Use   Smoking status: Never    Passive exposure: Past   Smokeless tobacco: Never  Vaping Use   Vaping Use: Never used  Substance and Sexual Activity   Alcohol use: No   Drug use: No   Sexual activity: Yes    Birth control/protection: Post-menopausal  Other Topics Concern   Not on file  Social History Narrative   Not on file   Social Determinants of Health   Financial  Resource Strain: Not on file  Food Insecurity: Not on file  Transportation Needs: Not on file  Physical Activity: Not on file  Stress: Not on file  Social Connections: Not on file  Intimate Partner Violence: Not on file    Tobacco Use: Low Risk  (01/31/2023)   Patient History    Smoking Tobacco Use: Never    Smokeless Tobacco Use: Never    Passive Exposure: Past   Social History   Substance and Sexual Activity  Alcohol Use No    Family History  Problem Relation Age of Onset   Heart disease Mother    Psoriasis Father    Arthritis Father  Heart disease Brother    Heart attack Brother    Diabetes Maternal Grandfather    Cancer Maternal Uncle        colon   Colon cancer Maternal Uncle 19   Asthma Sister    Breast cancer Neg Hx     Review of Systems  Constitutional:  Negative for chills and fever.  HENT: Negative.    Eyes: Negative.   Respiratory:  Negative for cough and shortness of breath.   Cardiovascular:  Negative for chest pain and palpitations.  Gastrointestinal:  Negative for abdominal pain, nausea and vomiting.  Genitourinary:  Negative for dysuria, frequency and urgency.  Musculoskeletal:  Positive for joint pain.  Skin:  Negative for rash.   Objective:  Physical Exam: Well nourished and well developed.  General: Alert and oriented x3, cooperative and pleasant, no acute distress.  Head: normocephalic, atraumatic, neck supple.  Eyes: EOMI. Abdomen: non-tender to palpation and soft, normoactive bowel sounds. Musculoskeletal:  Right knee shows slight valgus. Range of motion is about 0 to 135 degrees. She is very tender laterally. There is slight medial tenderness also. There is no instability about the knee.   Calves soft and nontender. Motor function intact in LE. Strength 5/5 LE bilaterally. Neuro: Distal pulses 2+. Sensation to light touch intact in LE.  Vital signs in last 24 hours: BP: ()/()  Arterial Line BP: ()/()   Imaging Review Plain  radiographs demonstrate moderate degenerative joint disease of the right knee. The overall alignment is neutral. The bone quality appears to be adequate for age and reported activity level.  Assessment/Plan:  End stage arthritis, right knee   The patient history, physical examination, clinical judgment of the provider and imaging studies are consistent with end stage degenerative joint disease of the right knee and total knee arthroplasty is deemed medically necessary. The treatment options including medical management, injection therapy arthroscopy and arthroplasty were discussed at length. The risks and benefits of total knee arthroplasty were presented and reviewed. The risks due to aseptic loosening, infection, stiffness, patella tracking problems, thromboembolic complications and other imponderables were discussed. The patient acknowledged the explanation, agreed to proceed with the plan and consent was signed. Patient is being admitted for inpatient treatment for surgery, pain control, PT, OT, prophylactic antibiotics, VTE prophylaxis, progressive ambulation and ADLs and discharge planning. The patient is planning to be discharged  home .  Patient's anticipated LOS is less than 2 midnights, meeting these requirements: - Lives within 1 hour of care - Has a competent adult at home to recover with post-op - NO history of  - Chronic pain requiring opiods  - Diabetes  - Coronary Artery Disease  - Heart failure  - Heart attack  - Stroke  - DVT/VTE  - Cardiac arrhythmia  - Respiratory Failure/COPD  - Renal failure  - Anemia  - Advanced Liver disease  Therapy Plans: [facility] Disposition: Home with Husband Planned DVT Prophylaxis: Aspirin 81 mg BID DME Needed: None PCP: Gweneth Dimitri, MD (clearance received) TXA: IV Allergies: bacitracin (eye redness), ibandronate, sulfa (headache), **latex** (itching) Anesthesia Concerns: None BMI: 24.5 Last HgbA1c: not  diabetic  Pharmacy: [facility] ([address])  Other: -Has temporary dental implant in place - may hinder intubation but oral surgeon feels okay with her proceeding with surgery. Discussed likely will be spinal anesthetic and not general -Hx Sjogrens -Tolerates tramadol 50 mg TID while on escitalopram  - Patient was instructed on what medications to stop prior to surgery. - Follow-up visit  in 2 weeks with Dr. Lequita Halt - Begin physical therapy following surgery - Pre-operative lab work as pre-surgical testing - Prescriptions will be provided in hospital at time of discharge  R. Arcola Jansky, PA-C Orthopedic Surgery [facility] [place]

## 2023-03-13 NOTE — Telephone Encounter (Signed)
Patient is scheduled for a total knee replacement on 04/08/2023. Patient states she was advised by the orho PA that our office advised her PCP that she should hold plaquenil for 6 weeks prior to surgery. Patient would like to clarify that. Patient states that Dr. Lequita Halt only requires it to be held for 2 weeks prior.   Patient also states she had a tooth extracted approximately 1 month ago and had to use a special mouth rinse which caused irritation in her mouth. Patient denies any blisters or sores in her mouth but reports the roof of her mouth is sore. Patient stopped using that mouth rinse 1 week ago and it now back to using biotene. Patient has noticed little improvement in the irritation since stopping the mouth rinse. Patient would like to know if this could be related to sjogren's and if she needs to be seen by you for this issue. Please advise. Thanks!

## 2023-03-13 NOTE — Telephone Encounter (Signed)
We do not recommend stopping hydroxychloroquine for knee surgery.  The irritation in the mouth appears to be related to the mouth rinse.

## 2023-03-13 NOTE — Telephone Encounter (Signed)
She should follow recommendations of her surgeon as he is doing the surgery.

## 2023-03-19 ENCOUNTER — Telehealth: Payer: Self-pay | Admitting: Rheumatology

## 2023-03-19 NOTE — Telephone Encounter (Signed)
She has Sjogren's.  Her labs were recently obtained which were consistent with Sjogren's.  Because of dry mouth she may have sensitivity to many medications which can irritate her mouth.  No further testing is needed.

## 2023-03-19 NOTE — Telephone Encounter (Signed)
I called patient, patient verbalized understanding. 

## 2023-03-19 NOTE — Telephone Encounter (Signed)
Pt is calling in stating that she is having some burning in her mouth from a medication (chlorhexidine/gluconate) which she has stopped taken this is what her Periodontist gave to her.  Pt would like to see if she could be tested to see if her condition is making it flare up.  Pt would like to have a call back to discuss if this is something that she can be seen for.

## 2023-03-20 ENCOUNTER — Encounter (HOSPITAL_COMMUNITY): Payer: Self-pay

## 2023-03-26 NOTE — Progress Notes (Signed)
COVID Vaccine received:  []  No [x]  Yes Date of any COVID positive Test in last 39 days:No  PCP - Dr. Gweneth Dimitri Cardiologist - No  Chest x-ray - CT- 08/29/21 EPIC EKG -  03/28/23 EPIC Stress Test - No ECHO - No Cardiac Cath - No  Bowel Prep - [x]  No  []   Yes ______  Pacemaker / ICD device [x]  No []  Yes   Spinal Cord Stimulator:[x]  No []  Yes       History of Sleep Apnea? [x]  No []  Yes   CPAP used?- [x]  No []  Yes    Does the patient monitor blood sugar?          [x]  No []  Yes  []  N/A  Patient has: [x]  NO Hx DM   []  Pre-DM                 []  DM1  []   DM2 Does patient have a [employer] or Dexacom? [x]  No []  Yes   Fasting Blood Sugar Ranges- N/A Checks Blood Sugar _____ times a day  GLP1 agonist / usual dose - No GLP1 instructions:  SGLT-2 inhibitors / usual dose - No SGLT-2 instructions:   Blood Thinner / Instructions: Aspirin Instructions:ASA 81mg   Comments: Instructed by office to hold 5 days PTS  Activity level: Patient is able  to climb a flight of stairs without difficulty; [x]  No CP  [x]  No SOB  Patient can perform ADLs without assistance.   Anesthesia review:   Patient denies shortness of breath, fever, cough and chest pain at PAT appointment.  Patient verbalized understanding and agreement to the Pre-Surgical Instructions that were given to them at this PAT appointment. Patient was also educated of the need to review these PAT instructions again prior to his/her surgery.I reviewed the appropriate phone numbers to call if they have any and questions or concerns.

## 2023-03-26 NOTE — Patient Instructions (Signed)
SURGICAL WAITING ROOM VISITATION  Patients having surgery or a procedure may have no more than 2 support people in the waiting area - these visitors may rotate.    Children under the age of 65 must have an adult with them who is not the patient.  Due to an increase in RSV and influenza rates and associated hospitalizations, children ages 25 and under may not visit patients in [facility] hospitals.  If the patient needs to stay at the hospital during part of their recovery, the visitor guidelines for inpatient rooms apply. Pre-op nurse will coordinate an appropriate time for 1 support person to accompany patient in pre-op.  This support person may not rotate.    Please refer to the [facility] website for the visitor guidelines for Inpatients (after your surgery is over and you are in a regular room).       Your procedure is scheduled on: 04/08/23   Report to [facility] Main Entrance    Report to admitting at 8 AM   Call this number if you have problems the morning of surgery [phone]   Do not eat food :After Midnight.   After Midnight you may have the following liquids until 4:30 AM DAY OF SURGERY  Water Non-Citrus Juices (without pulp, NO RED-Apple, White grape, White cranberry) Black Coffee (NO MILK/CREAM OR CREAMERS, sugar ok)  Clear Tea (NO MILK/CREAM OR CREAMERS, sugar ok) regular and decaf                             Plain Jell-O (NO RED)                                           Fruit ices (not with fruit pulp, NO RED)                                     Popsicles (NO RED)                                                               Sports drinks like Gatorade (NO RED)                  The day of surgery:  Drink ONE (1) Pre-Surgery Clear Ensure 4:30 AM the morning of surgery. Drink in one sitting. Do not sip.  This drink was given to you during your hospital  pre-op appointment visit. Nothing else to drink after completing the  Pre-Surgery Clear Ensure            If you have questions, please contact your surgeon's office.   FOLLOW BOWEL PREP AND ANY ADDITIONAL PRE OP INSTRUCTIONS YOU RECEIVED FROM YOUR SURGEON'S OFFICE!!!     Oral Hygiene is also important to reduce your risk of infection.                                    Remember - BRUSH YOUR TEETH THE MORNING OF SURGERY WITH YOUR REGULAR  TOOTHPASTE  DENTURES WILL BE REMOVED PRIOR TO SURGERY PLEASE DO NOT APPLY "Poly grip" OR ADHESIVES!!!   Do NOT smoke after Midnight   Take these medicines the morning of surgery with A SIP OF WATER: Tylenol, Lexapro(escitaprolam) , Fexofenadine(Allegra), Synthroid(levothyroxine), Rosuvastatin(Crestor)  DO NOT TAKE ANY ORAL DIABETIC MEDICATIONS DAY OF YOUR SURGERY  Bring CPAP mask and tubing day of surgery.                              You may not have any metal on your body including hair pins, jewelry, and body piercing             Do not wear make-up, lotions, powders, perfumes or deodorant  Do not wear nail polish including gel and S&S, artificial/acrylic nails, or any other type of covering on natural nails including finger and toenails. If you have artificial nails, gel coating, etc. that needs to be removed by a nail salon please have this removed prior to surgery or surgery may need to be canceled/ delayed if the surgeon/ anesthesia feels like they are unable to be safely monitored.   Do not shave  48 hours prior to surgery.                 Do not bring valuables to the hospital. [facility] IS NOT             RESPONSIBLE   FOR VALUABLES.   Contacts, glasses, dentures or bridgework may not be worn into surgery.   Bring small overnight bag day of surgery.   DO NOT BRING YOUR HOME MEDICATIONS TO THE HOSPITAL. PHARMACY WILL DISPENSE MEDICATIONS LISTED ON YOUR MEDICATION LIST TO YOU DURING YOUR ADMISSION IN THE HOSPITAL!    Patients discharged on the day of surgery will not be allowed to drive home.  Someone NEEDS to stay with you  for the first 24 hours after anesthesia.   Special Instructions: Bring a copy of your healthcare power of attorney and living will documents the day of surgery if you haven't scanned them before.              Please read over the following fact sheets you were given: IF YOU HAVE QUESTIONS ABOUT YOUR PRE-OP INSTRUCTIONS PLEASE CALL [phone]   If you received a COVID test during your pre-op visit  it is requested that you wear a mask when out in public, stay away from anyone that may not be feeling well and notify your surgeon if you develop symptoms. If you test positive for Covid or have been in contact with anyone that has tested positive in the last 10 days please notify you surgeon.      Pre-operative 5 CHG Bath Instructions   You can play a key role in reducing the risk of infection after surgery. Your skin needs to be as free of germs as possible. You can reduce the number of germs on your skin by washing with CHG (chlorhexidine gluconate) soap before surgery. CHG is an antiseptic soap that kills germs and continues to kill germs even after washing.   DO NOT use if you have an allergy to chlorhexidine/CHG or antibacterial soaps. If your skin becomes reddened or irritated, stop using the CHG and notify one of our RNs at [phone].   Please shower with the CHG soap starting 4 days before surgery using the following schedule:     Please keep in mind the  following:  DO NOT shave, including legs and underarms, starting the day of your first shower.   You may shave your face at any point before/day of surgery.  Place clean sheets on your bed the day you start using CHG soap. Use a clean washcloth (not used since being washed) for each shower. DO NOT sleep with pets once you start using the CHG.   CHG Shower Instructions:  If you choose to wash your hair and private area, wash first with your normal shampoo/soap.  After you use shampoo/soap, rinse your hair and body thoroughly to  remove shampoo/soap residue.  Turn the water OFF and apply about 3 tablespoons (45 ml) of CHG soap to a CLEAN washcloth.  Apply CHG soap ONLY FROM YOUR NECK DOWN TO YOUR TOES (washing for 3-5 minutes)  DO NOT use CHG soap on face, private areas, open wounds, or sores.  Pay special attention to the area where your surgery is being performed.  If you are having back surgery, having someone wash your back for you may be helpful. Wait 2 minutes after CHG soap is applied, then you may rinse off the CHG soap.  Pat dry with a clean towel  Put on clean clothes/pajamas   If you choose to wear lotion, please use ONLY the CHG-compatible lotions on the back of this paper.     Additional instructions for the day of surgery: DO NOT APPLY any lotions, deodorants, cologne, or perfumes.   Put on clean/comfortable clothes.  Brush your teeth.  Ask your nurse before applying any prescription medications to the skin.      CHG Compatible Lotions   Aveeno Moisturizing lotion  Cetaphil Moisturizing Cream  Cetaphil Moisturizing Lotion  Clairol Herbal Essence Moisturizing Lotion, Dry Skin  Clairol Herbal Essence Moisturizing Lotion, Extra Dry Skin  Clairol Herbal Essence Moisturizing Lotion, Normal Skin  Curel Age Defying Therapeutic Moisturizing Lotion with Alpha Hydroxy  Curel Extreme Care Body Lotion  Curel Soothing Hands Moisturizing Hand Lotion  Curel Therapeutic Moisturizing Cream, Fragrance-Free  Curel Therapeutic Moisturizing Lotion, Fragrance-Free  Curel Therapeutic Moisturizing Lotion, Original Formula  Eucerin Daily Replenishing Lotion  Eucerin Dry Skin Therapy Plus Alpha Hydroxy Crme  Eucerin Dry Skin Therapy Plus Alpha Hydroxy Lotion  Eucerin Original Crme  Eucerin Original Lotion  Eucerin Plus Crme Eucerin Plus Lotion  Eucerin TriLipid Replenishing Lotion  Keri Anti-Bacterial Hand Lotion  Keri Deep Conditioning Original Lotion Dry Skin Formula Softly Scented  Keri Deep Conditioning  Original Lotion, Fragrance Free Sensitive Skin Formula  Keri Lotion Fast Absorbing Fragrance Free Sensitive Skin Formula  Keri Lotion Fast Absorbing Softly Scented Dry Skin Formula  Keri Original Lotion  Keri Skin Renewal Lotion Keri Silky Smooth Lotion  Keri Silky Smooth Sensitive Skin Lotion  Nivea Body Creamy Conditioning Oil  Nivea Body Extra Enriched Teacher, adult education Moisturizing Lotion Nivea Crme  Nivea Skin Firming Lotion  NutraDerm 30 Skin Lotion  NutraDerm Skin Lotion  NutraDerm Therapeutic Skin Cream  NutraDerm Therapeutic Skin Lotion  ProShield Protective Hand Cream  Provon moisturizing lotion

## 2023-03-28 ENCOUNTER — Other Ambulatory Visit: Payer: Self-pay

## 2023-03-28 ENCOUNTER — Encounter (HOSPITAL_COMMUNITY)
Admission: RE | Admit: 2023-03-28 | Discharge: 2023-03-28 | Disposition: A | Discharge status: Home / Self Care | Payer: Medicare Other | Source: Ambulatory Visit | Attending: Orthopedic Surgery | Admitting: Orthopedic Surgery

## 2023-03-28 ENCOUNTER — Encounter (HOSPITAL_COMMUNITY): Payer: Self-pay

## 2023-03-28 VITALS — BP 143/80 | HR 61 | Temp 98.4°F | Resp 20 | Ht 65.0 in | Wt 146.0 lb

## 2023-03-28 DIAGNOSIS — R9431 Abnormal electrocardiogram [ECG] [EKG]: Secondary | ICD-10-CM | POA: Diagnosis not present

## 2023-03-28 DIAGNOSIS — Z01818 Encounter for other preprocedural examination: Secondary | ICD-10-CM | POA: Insufficient documentation

## 2023-03-28 HISTORY — DX: Anxiety disorder, unspecified: F41.9

## 2023-03-28 HISTORY — DX: Malignant (primary) neoplasm, unspecified: C80.1

## 2023-03-28 LAB — SURGICAL PCR SCREEN
MRSA, PCR: NEGATIVE
Staphylococcus aureus: NEGATIVE

## 2023-03-28 LAB — CBC
HCT: 40.5 % (ref 36.0–46.0)
Hemoglobin: 13.7 g/dL (ref 12.0–15.0)
MCH: 33.8 pg (ref 26.0–34.0)
MCHC: 33.8 g/dL (ref 30.0–36.0)
MCV: 100 fL (ref 80.0–100.0)
Platelets: 216 10*3/uL (ref 150–400)
RBC: 4.05 MIL/uL (ref 3.87–5.11)
RDW: 12.8 % (ref 11.5–15.5)
WBC: 5.9 10*3/uL (ref 4.0–10.5)
nRBC: 0 % (ref 0.0–0.2)

## 2023-03-28 LAB — BASIC METABOLIC PANEL
Anion gap: 7 (ref 5–15)
BUN: 16 mg/dL (ref 8–23)
CO2: 26 mmol/L (ref 22–32)
Calcium: 9 mg/dL (ref 8.9–10.3)
Chloride: 105 mmol/L (ref 98–111)
Creatinine, Ser: 0.7 mg/dL (ref 0.44–1.00)
GFR, Estimated: >60 mL/min (ref >60)
Glucose, Bld: 97 mg/dL (ref 70–99)
Potassium: 4.5 mmol/L (ref 3.5–5.1)
Sodium: 138 mmol/L (ref 135–145)

## 2023-04-08 ENCOUNTER — Other Ambulatory Visit: Payer: Self-pay

## 2023-04-08 ENCOUNTER — Ambulatory Visit (HOSPITAL_BASED_OUTPATIENT_CLINIC_OR_DEPARTMENT_OTHER): Payer: Medicare Other | Admitting: Anesthesiology

## 2023-04-08 ENCOUNTER — Encounter (HOSPITAL_COMMUNITY)
Admission: RE | Disposition: A | Discharge status: Home / Self Care | Payer: Self-pay | Source: Home / Self Care | Attending: Orthopedic Surgery

## 2023-04-08 ENCOUNTER — Observation Stay (HOSPITAL_COMMUNITY)
Admission: RE | Admit: 2023-04-08 | Discharge: 2023-04-09 | Disposition: A | Discharge status: Home / Self Care | Payer: Medicare Other | Attending: Orthopedic Surgery | Admitting: Orthopedic Surgery

## 2023-04-08 ENCOUNTER — Encounter (HOSPITAL_COMMUNITY): Payer: Self-pay | Admitting: Orthopedic Surgery

## 2023-04-08 ENCOUNTER — Ambulatory Visit (HOSPITAL_COMMUNITY): Payer: Medicare Other | Admitting: Anesthesiology

## 2023-04-08 DIAGNOSIS — Z7982 Long term (current) use of aspirin: Secondary | ICD-10-CM | POA: Diagnosis not present

## 2023-04-08 DIAGNOSIS — Z85828 Personal history of other malignant neoplasm of skin: Secondary | ICD-10-CM | POA: Diagnosis not present

## 2023-04-08 DIAGNOSIS — Z79899 Other long term (current) drug therapy: Secondary | ICD-10-CM | POA: Insufficient documentation

## 2023-04-08 DIAGNOSIS — M1711 Unilateral primary osteoarthritis, right knee: Secondary | ICD-10-CM | POA: Diagnosis present

## 2023-04-08 DIAGNOSIS — Z91040 Latex allergy status: Secondary | ICD-10-CM | POA: Insufficient documentation

## 2023-04-08 DIAGNOSIS — E039 Hypothyroidism, unspecified: Secondary | ICD-10-CM

## 2023-04-08 DIAGNOSIS — F419 Anxiety disorder, unspecified: Secondary | ICD-10-CM | POA: Diagnosis not present

## 2023-04-08 DIAGNOSIS — M170 Bilateral primary osteoarthritis of knee: Secondary | ICD-10-CM | POA: Diagnosis present

## 2023-04-08 HISTORY — PX: TOTAL KNEE ARTHROPLASTY: SHX125

## 2023-04-08 SURGERY — ARTHROPLASTY, KNEE, TOTAL
Anesthesia: Spinal | Site: Knee | Laterality: Right

## 2023-04-08 MED ORDER — OXYCODONE HCL 5 MG PO TABS
5.0000 mg | ORAL_TABLET | ORAL | Status: DC | PRN
  Administered 2023-04-08: 10 mg via ORAL
  Administered 2023-04-08: 5 mg via ORAL
  Administered 2023-04-09 (×4): 10 mg via ORAL
  Filled 2023-04-08 (×3): qty 2
  Filled 2023-04-08: qty 1
  Filled 2023-04-08 (×2): qty 2

## 2023-04-08 MED ORDER — METHOCARBAMOL 500 MG IVPB - SIMPLE MED: 500.0000 mg | Freq: Four times a day (QID) | INTRAVENOUS | Status: DC | PRN

## 2023-04-08 MED ORDER — PROPOFOL 10 MG/ML IV BOLUS
INTRAVENOUS | Status: AC
  Filled 2023-04-08: qty 20

## 2023-04-08 MED ORDER — LACTATED RINGERS IV SOLN: INTRAVENOUS | Status: DC

## 2023-04-08 MED ORDER — ACETAMINOPHEN 10 MG/ML IV SOLN
1000.0000 mg | Freq: Four times a day (QID) | INTRAVENOUS | Status: DC
  Administered 2023-04-08: 1000 mg via INTRAVENOUS
  Filled 2023-04-08: qty 100

## 2023-04-08 MED ORDER — POLYETHYLENE GLYCOL 3350 17 G PO PACK: 17.0000 g | PACK | Freq: Every day | ORAL | Status: DC | PRN

## 2023-04-08 MED ORDER — CEFAZOLIN SODIUM-DEXTROSE 2-4 GM/100ML-% IV SOLN
2.0000 g | Freq: Four times a day (QID) | INTRAVENOUS | Status: AC
  Administered 2023-04-08 (×2): 2 g via INTRAVENOUS
  Filled 2023-04-08 (×2): qty 100

## 2023-04-08 MED ORDER — HYDROXYCHLOROQUINE SULFATE 200 MG PO TABS
200.0000 mg | ORAL_TABLET | Freq: Every day | ORAL | Status: DC
  Administered 2023-04-09: 200 mg via ORAL
  Filled 2023-04-08: qty 1

## 2023-04-08 MED ORDER — ONDANSETRON HCL 4 MG/2ML IJ SOLN
INTRAMUSCULAR | Status: AC
  Filled 2023-04-08: qty 2

## 2023-04-08 MED ORDER — SODIUM CHLORIDE 0.9 % IV SOLN
INTRAVENOUS | Status: DC | PRN
  Administered 2023-04-08: 80 mL

## 2023-04-08 MED ORDER — ORAL CARE MOUTH RINSE
15.0000 mL | Freq: Once | OROMUCOSAL | Status: AC
  Administered 2023-04-08: 15 mL via OROMUCOSAL

## 2023-04-08 MED ORDER — PHENYLEPHRINE 80 MCG/ML (10ML) SYRINGE FOR IV PUSH (FOR BLOOD PRESSURE SUPPORT)
PREFILLED_SYRINGE | INTRAVENOUS | Status: DC | PRN
  Administered 2023-04-08: 160 ug via INTRAVENOUS
  Administered 2023-04-08: 80 ug via INTRAVENOUS
  Administered 2023-04-08: 160 ug via INTRAVENOUS

## 2023-04-08 MED ORDER — SODIUM CHLORIDE (PF) 0.9 % IJ SOLN
INTRAMUSCULAR | Status: AC
  Filled 2023-04-08: qty 50

## 2023-04-08 MED ORDER — LORATADINE 10 MG PO TABS
10.0000 mg | ORAL_TABLET | Freq: Every day | ORAL | Status: DC
  Administered 2023-04-09: 10 mg via ORAL
  Filled 2023-04-08: qty 1

## 2023-04-08 MED ORDER — FENTANYL CITRATE PF 50 MCG/ML IJ SOSY
50.0000 ug | PREFILLED_SYRINGE | INTRAMUSCULAR | Status: DC
  Administered 2023-04-08: 50 ug via INTRAVENOUS
  Filled 2023-04-08: qty 2

## 2023-04-08 MED ORDER — DEXAMETHASONE SODIUM PHOSPHATE 10 MG/ML IJ SOLN
INTRAMUSCULAR | Status: AC
  Filled 2023-04-08: qty 1

## 2023-04-08 MED ORDER — TRANEXAMIC ACID-NACL 1000-0.7 MG/100ML-% IV SOLN
1000.0000 mg | INTRAVENOUS | Status: AC
  Administered 2023-04-08: 1000 mg via INTRAVENOUS
  Filled 2023-04-08: qty 100

## 2023-04-08 MED ORDER — FLEET ENEMA 7-19 GM/118ML RE ENEM: 1.0000 | ENEMA | Freq: Once | RECTAL | Status: DC | PRN

## 2023-04-08 MED ORDER — ASPIRIN 81 MG PO CHEW
81.0000 mg | CHEWABLE_TABLET | Freq: Two times a day (BID) | ORAL | Status: DC
  Administered 2023-04-09: 81 mg via ORAL
  Filled 2023-04-08: qty 1

## 2023-04-08 MED ORDER — BUPIVACAINE IN DEXTROSE 0.75-8.25 % IT SOLN
INTRATHECAL | Status: DC | PRN
  Administered 2023-04-08: 1.6 mL via INTRATHECAL

## 2023-04-08 MED ORDER — METHOCARBAMOL 500 MG PO TABS
500.0000 mg | ORAL_TABLET | Freq: Four times a day (QID) | ORAL | Status: DC | PRN
  Administered 2023-04-08 – 2023-04-09 (×2): 500 mg via ORAL
  Filled 2023-04-08 (×2): qty 1

## 2023-04-08 MED ORDER — POVIDONE-IODINE 10 % EX SWAB: 2.0000 | Freq: Once | CUTANEOUS | Status: DC

## 2023-04-08 MED ORDER — MORPHINE SULFATE (PF) 2 MG/ML IV SOLN: 1.0000 mg | INTRAVENOUS | Status: DC | PRN

## 2023-04-08 MED ORDER — DEXAMETHASONE SODIUM PHOSPHATE 10 MG/ML IJ SOLN
INTRAMUSCULAR | Status: DC | PRN
  Administered 2023-04-08: 8 mg via INTRAVENOUS

## 2023-04-08 MED ORDER — METOCLOPRAMIDE HCL 5 MG/ML IJ SOLN: 5.0000 mg | Freq: Three times a day (TID) | INTRAMUSCULAR | Status: DC | PRN

## 2023-04-08 MED ORDER — DIPHENHYDRAMINE HCL 12.5 MG/5ML PO ELIX: 12.5000 mg | ORAL_SOLUTION | ORAL | Status: DC | PRN

## 2023-04-08 MED ORDER — POLYVINYL ALCOHOL 1.4 % OP SOLN
1.0000 [drp] | Freq: Two times a day (BID) | OPHTHALMIC | Status: DC
  Administered 2023-04-08 – 2023-04-09 (×2): 1 [drp] via OPHTHALMIC
  Filled 2023-04-08: qty 15

## 2023-04-08 MED ORDER — MENTHOL 3 MG MT LOZG: 1.0000 | LOZENGE | OROMUCOSAL | Status: DC | PRN

## 2023-04-08 MED ORDER — PHENYLEPHRINE 80 MCG/ML (10ML) SYRINGE FOR IV PUSH (FOR BLOOD PRESSURE SUPPORT)
PREFILLED_SYRINGE | INTRAVENOUS | Status: AC
  Filled 2023-04-08: qty 10

## 2023-04-08 MED ORDER — ESCITALOPRAM OXALATE 10 MG PO TABS
10.0000 mg | ORAL_TABLET | Freq: Every day | ORAL | Status: DC
  Administered 2023-04-09: 10 mg via ORAL
  Filled 2023-04-08: qty 1

## 2023-04-08 MED ORDER — IPRATROPIUM BROMIDE 0.03 % NA SOLN: 2.0000 | Freq: Two times a day (BID) | NASAL | Status: DC | PRN

## 2023-04-08 MED ORDER — METOCLOPRAMIDE HCL 5 MG PO TABS: 5.0000 mg | ORAL_TABLET | Freq: Three times a day (TID) | ORAL | Status: DC | PRN

## 2023-04-08 MED ORDER — BISACODYL 10 MG RE SUPP: 10.0000 mg | Freq: Every day | RECTAL | Status: DC | PRN

## 2023-04-08 MED ORDER — BUPIVACAINE LIPOSOME 1.3 % IJ SUSP: 20.0000 mL | Freq: Once | INTRAMUSCULAR | Status: DC

## 2023-04-08 MED ORDER — PROPOFOL 10 MG/ML IV BOLUS
INTRAVENOUS | Status: DC | PRN
  Administered 2023-04-08: 20 mg via INTRAVENOUS
  Administered 2023-04-08: 30 mg via INTRAVENOUS
  Administered 2023-04-08 (×5): 20 mg via INTRAVENOUS

## 2023-04-08 MED ORDER — SODIUM CHLORIDE 0.9 % IV SOLN: INTRAVENOUS | Status: DC

## 2023-04-08 MED ORDER — ACETAMINOPHEN 500 MG PO TABS
1000.0000 mg | ORAL_TABLET | Freq: Four times a day (QID) | ORAL | Status: DC
  Administered 2023-04-08 – 2023-04-09 (×3): 1000 mg via ORAL
  Filled 2023-04-08 (×3): qty 2

## 2023-04-08 MED ORDER — DEXAMETHASONE SODIUM PHOSPHATE 10 MG/ML IJ SOLN: 8.0000 mg | Freq: Once | INTRAMUSCULAR | Status: DC

## 2023-04-08 MED ORDER — PHENOL 1.4 % MT LIQD: 1.0000 | OROMUCOSAL | Status: DC | PRN

## 2023-04-08 MED ORDER — DOCUSATE SODIUM 100 MG PO CAPS
100.0000 mg | ORAL_CAPSULE | Freq: Two times a day (BID) | ORAL | Status: DC
  Administered 2023-04-08 – 2023-04-09 (×2): 100 mg via ORAL
  Filled 2023-04-08 (×2): qty 1

## 2023-04-08 MED ORDER — FLUTICASONE PROPIONATE 50 MCG/ACT NA SUSP
2.0000 | Freq: Every day | NASAL | Status: DC
  Filled 2023-04-08: qty 16

## 2023-04-08 MED ORDER — LEVOTHYROXINE SODIUM 88 MCG PO TABS: 132.0000 ug | ORAL_TABLET | ORAL | Status: DC

## 2023-04-08 MED ORDER — PROPOFOL 500 MG/50ML IV EMUL
INTRAVENOUS | Status: DC | PRN
  Administered 2023-04-08: 50 ug/kg/min via INTRAVENOUS

## 2023-04-08 MED ORDER — SODIUM CHLORIDE (PF) 0.9 % IJ SOLN
INTRAMUSCULAR | Status: AC
  Filled 2023-04-08: qty 10

## 2023-04-08 MED ORDER — ROSUVASTATIN CALCIUM 10 MG PO TABS
10.0000 mg | ORAL_TABLET | Freq: Every day | ORAL | Status: DC
  Administered 2023-04-09: 10 mg via ORAL
  Filled 2023-04-08: qty 1

## 2023-04-08 MED ORDER — ONDANSETRON HCL 4 MG PO TABS: 4.0000 mg | ORAL_TABLET | Freq: Four times a day (QID) | ORAL | Status: DC | PRN

## 2023-04-08 MED ORDER — ONDANSETRON HCL 4 MG/2ML IJ SOLN
INTRAMUSCULAR | Status: DC | PRN
  Administered 2023-04-08: 4 mg via INTRAVENOUS

## 2023-04-08 MED ORDER — PROPOFOL 1000 MG/100ML IV EMUL
INTRAVENOUS | Status: AC
  Filled 2023-04-08: qty 100

## 2023-04-08 MED ORDER — LEVOTHYROXINE SODIUM 88 MCG PO TABS
88.0000 ug | ORAL_TABLET | ORAL | Status: DC
  Administered 2023-04-09: 88 ug via ORAL
  Filled 2023-04-08: qty 1

## 2023-04-08 MED ORDER — DEXAMETHASONE SODIUM PHOSPHATE 10 MG/ML IJ SOLN
10.0000 mg | Freq: Once | INTRAMUSCULAR | Status: AC
  Administered 2023-04-09: 10 mg via INTRAVENOUS
  Filled 2023-04-08: qty 1

## 2023-04-08 MED ORDER — CHLORHEXIDINE GLUCONATE 0.12 % MT SOLN: 15.0000 mL | Freq: Once | OROMUCOSAL | Status: DC

## 2023-04-08 MED ORDER — CEFAZOLIN SODIUM-DEXTROSE 2-4 GM/100ML-% IV SOLN
2.0000 g | INTRAVENOUS | Status: AC
  Administered 2023-04-08: 2 g via INTRAVENOUS
  Filled 2023-04-08: qty 100

## 2023-04-08 MED ORDER — MIDAZOLAM HCL 2 MG/2ML IJ SOLN
1.0000 mg | INTRAMUSCULAR | Status: DC
  Filled 2023-04-08: qty 2

## 2023-04-08 MED ORDER — TRAMADOL HCL 50 MG PO TABS: 50.0000 mg | ORAL_TABLET | Freq: Three times a day (TID) | ORAL | Status: DC | PRN

## 2023-04-08 MED ORDER — ONDANSETRON HCL 4 MG/2ML IJ SOLN: 4.0000 mg | Freq: Four times a day (QID) | INTRAMUSCULAR | Status: DC | PRN

## 2023-04-08 MED ORDER — HYDROMORPHONE HCL 1 MG/ML IJ SOLN: 0.2500 mg | INTRAMUSCULAR | Status: DC | PRN

## 2023-04-08 MED ORDER — SODIUM CHLORIDE 0.9 % IR SOLN
Status: DC | PRN
  Administered 2023-04-08: 1000 mL

## 2023-04-08 MED ORDER — BUPIVACAINE-EPINEPHRINE (PF) 0.5% -1:200000 IJ SOLN
INTRAMUSCULAR | Status: DC | PRN
  Administered 2023-04-08: 20 mL via PERINEURAL

## 2023-04-08 MED ORDER — 0.9 % SODIUM CHLORIDE (POUR BTL) OPTIME
TOPICAL | Status: DC | PRN
  Administered 2023-04-08: 1000 mL

## 2023-04-08 SURGICAL SUPPLY — 58 items
ADH SKN CLS APL DERMABOND .7 (GAUZE/BANDAGES/DRESSINGS) ×1
ATTUNE PS FEM RT SZ 5 CEM KNEE (Femur) IMPLANT
ATTUNE PSRP INSE SZ5 7 KNEE (Insert) IMPLANT
BAG COUNTER SPONGE SURGICOUNT (BAG) IMPLANT
BAG SPEC THK2 15X12 ZIP CLS (MISCELLANEOUS) ×1
BAG SPNG CNTER NS LX DISP (BAG)
BAG ZIPLOCK 12X15 (MISCELLANEOUS) ×2 IMPLANT
BASE TIBIAL ROT PLAT SZ 5 KNEE (Knees) IMPLANT
BLADE SAG 18X100X1.27 (BLADE) ×2 IMPLANT
BLADE SAW SGTL 11.0X1.19X90.0M (BLADE) ×2 IMPLANT
BNDG CMPR 5X62 HK CLSR LF (GAUZE/BANDAGES/DRESSINGS) ×1
BNDG CMPR MED 10X6 ELC LF (GAUZE/BANDAGES/DRESSINGS) ×1
BNDG ELASTIC 6INX 5YD STR LF (GAUZE/BANDAGES/DRESSINGS) ×2 IMPLANT
BNDG ELASTIC 6X10 VLCR STRL LF (GAUZE/BANDAGES/DRESSINGS) IMPLANT
BOWL SMART MIX CTS (DISPOSABLE) ×2 IMPLANT
BSPLAT TIB 5 CMNT ROT PLAT STR (Knees) ×1 IMPLANT
CEMENT HV SMART SET (Cement) ×4 IMPLANT
COVER SURGICAL LIGHT HANDLE (MISCELLANEOUS) ×2 IMPLANT
CUFF TOURN SGL QUICK 34 (TOURNIQUET CUFF) ×1
CUFF TRNQT CYL 34X4.125X (TOURNIQUET CUFF) ×2 IMPLANT
DERMABOND ADVANCED .7 DNX12 (GAUZE/BANDAGES/DRESSINGS) ×2 IMPLANT
DRAPE INCISE IOBAN 66X45 STRL (DRAPES) ×2 IMPLANT
DRAPE U-SHAPE 47X51 STRL (DRAPES) ×2 IMPLANT
DRSG AQUACEL AG ADV 3.5X10 (GAUZE/BANDAGES/DRESSINGS) ×2 IMPLANT
DURAPREP 26ML APPLICATOR (WOUND CARE) ×2 IMPLANT
ELECT REM PT RETURN 15FT ADLT (MISCELLANEOUS) ×2 IMPLANT
GLOVE BIO SURGEON STRL SZ 6.5 (GLOVE) IMPLANT
GLOVE BIO SURGEON STRL SZ8 (GLOVE) ×2 IMPLANT
GLOVE BIOGEL PI IND STRL 6.5 (GLOVE) IMPLANT
GLOVE BIOGEL PI IND STRL 7.0 (GLOVE) IMPLANT
GLOVE BIOGEL PI IND STRL 8 (GLOVE) ×2 IMPLANT
GOWN STRL REUS W/ TWL LRG LVL3 (GOWN DISPOSABLE) ×2 IMPLANT
GOWN STRL REUS W/TWL LRG LVL3 (GOWN DISPOSABLE) ×1
HANDPIECE INTERPULSE COAX TIP (DISPOSABLE) ×1
HOLDER FOLEY CATH W/STRAP (MISCELLANEOUS) IMPLANT
IMMOBILIZER KNEE 20 (SOFTGOODS) ×1
IMMOBILIZER KNEE 20 THIGH 36 (SOFTGOODS) ×2 IMPLANT
KIT TURNOVER KIT A (KITS) IMPLANT
MANIFOLD NEPTUNE II (INSTRUMENTS) ×2 IMPLANT
NS IRRIG 1000ML POUR BTL (IV SOLUTION) ×2 IMPLANT
PACK TOTAL KNEE CUSTOM (KITS) ×2 IMPLANT
PADDING CAST ABS COTTON 6X4 NS (CAST SUPPLIES) IMPLANT
PADDING CAST COTTON 6X4 STRL (CAST SUPPLIES) ×4 IMPLANT
PATELLA MEDIAL ATTUN 35MM KNEE (Knees) IMPLANT
PIN STEINMAN FIXATION KNEE (PIN) IMPLANT
PROTECTOR NERVE ULNAR (MISCELLANEOUS) ×2 IMPLANT
SET HNDPC FAN SPRY TIP SCT (DISPOSABLE) ×2 IMPLANT
SPIKE FLUID TRANSFER (MISCELLANEOUS) ×2 IMPLANT
SUT MNCRL AB 4-0 PS2 18 (SUTURE) ×2 IMPLANT
SUT STRATAFIX 0 PDS 27 VIOLET (SUTURE) ×1
SUT VIC AB 2-0 CT1 27 (SUTURE) ×3
SUT VIC AB 2-0 CT1 TAPERPNT 27 (SUTURE) ×6 IMPLANT
SUTURE STRATFX 0 PDS 27 VIOLET (SUTURE) ×2 IMPLANT
TIBIAL BASE ROT PLAT SZ 5 KNEE (Knees) ×1 IMPLANT
TRAY FOLEY MTR SLVR 16FR STAT (SET/KITS/TRAYS/PACK) ×2 IMPLANT
TUBE SUCTION HIGH CAP CLEAR NV (SUCTIONS) ×2 IMPLANT
WATER STERILE IRR 1000ML POUR (IV SOLUTION) ×4 IMPLANT
WRAP KNEE MAXI GEL POST OP (GAUZE/BANDAGES/DRESSINGS) ×2 IMPLANT

## 2023-04-08 NOTE — Plan of Care (Signed)
  Problem: Coping: Goal: Level of anxiety will decrease Outcome: Progressing   Problem: Pain Managment: Goal: General experience of comfort will improve Outcome: Progressing   

## 2023-04-08 NOTE — Discharge Instructions (Signed)
 Frank Aluisio, MD Total Joint Specialist [facility] [address] [place], [place] [address] [phone]  TOTAL KNEE REPLACEMENT POSTOPERATIVE DIRECTIONS    Knee Rehabilitation, Guidelines Following Surgery  Results after knee surgery are often greatly improved when you follow the exercise, range of motion and muscle strengthening exercises prescribed by your doctor. Safety measures are also important to protect the knee from further injury. If any of these exercises cause you to have increased pain or swelling in your knee joint, decrease the amount until you are comfortable again and slowly increase them. If you have problems or questions, call your caregiver or physical therapist for advice.   BLOOD CLOT PREVENTION Take 81 mg Aspirin two times a day for three weeks following surgery. Then take an 81 mg Aspirin once a day for three weeks. Then discontinue Aspirin. You may resume your vitamins/supplements upon discharge from the hospital. Do not take any NSAIDs (Advil, Aleve, Ibuprofen, Meloxicam, etc.) for 3 weeks, while taking 81mg Aspirin twice a day.   HOME CARE INSTRUCTIONS  Remove items at home which could result in a fall. This includes throw rugs or furniture in walking pathways.  ICE to the affected knee as much as tolerated. Icing helps control swelling. If the swelling is well controlled you will be more comfortable and rehab easier. Continue to use ice on the knee for pain and swelling from surgery. You may notice swelling that will progress down to the foot and ankle. This is normal after surgery. Elevate the leg when you are not up walking on it.    Continue to use the breathing machine which will help keep your temperature down. It is common for your temperature to cycle up and down following surgery, especially at night when you are not up moving around and exerting yourself. The breathing machine keeps your lungs expanded and your temperature  down. Do not place pillow under the operative knee, focus on keeping the knee straight while resting  DIET You may resume your previous home diet once you are discharged from the hospital.  DRESSING / WOUND CARE / SHOWERING Keep your bulky bandage on for 2 days. On the third post-operative day you may remove the Ace bandage and gauze. There is a waterproof adhesive bandage on your skin which will stay in place until your first follow-up appointment. Once you remove this you will not need to place another bandage You may begin showering 3 days following surgery, but do not submerge the incision under water.  ACTIVITY For the first 5 days, the key is rest and control of pain and swelling Do your home exercises twice a day starting on post-operative day 3. On the days you go to physical therapy, just do the home exercises once that day. You should rest, ice and elevate the leg for 50 minutes out of every hour. Get up and walk/stretch for 10 minutes per hour. After 5 days you can increase your activity slowly as tolerated. Walk with your walker as instructed. Use the walker until you are comfortable transitioning to a cane. Walk with the cane in the opposite hand of the operative leg. You may discontinue the cane once you are comfortable and walking steadily. Avoid periods of inactivity such as sitting longer than an hour when not asleep. This helps prevent blood clots.  You may discontinue the knee immobilizer once you are able to perform a straight leg raise while lying down. You may resume a sexual relationship in   one month or when given the OK by your doctor.  You may return to work once you are cleared by your doctor.  Do not drive a car for 6 weeks or until released by your surgeon.  Do not drive while taking narcotics.  TED HOSE STOCKINGS Wear the elastic stockings on both legs for three weeks following surgery during the day. You may remove them at night for sleeping.  WEIGHT  BEARING Weight bearing as tolerated with assist device (walker, cane, etc) as directed, use it as long as suggested by your surgeon or therapist, typically at least 4-6 weeks.  POSTOPERATIVE CONSTIPATION PROTOCOL Constipation - defined medically as fewer than three stools per week and severe constipation as less than one stool per week.  One of the most common issues patients have following surgery is constipation.  Even if you have a regular bowel pattern at home, your normal regimen is likely to be disrupted due to multiple reasons following surgery.  Combination of anesthesia, postoperative narcotics, change in appetite and fluid intake all can affect your bowels.  In order to avoid complications following surgery, here are some recommendations in order to help you during your recovery period.  Colace (docusate) - Pick up an over-the-counter form of Colace or another stool softener and take twice a day as long as you are requiring postoperative pain medications.  Take with a full glass of water daily.  If you experience loose stools or diarrhea, hold the colace until you stool forms back up. If your symptoms do not get better within 1 week or if they get worse, check with your doctor. Dulcolax (bisacodyl) - Pick up over-the-counter and take as directed by the product packaging as needed to assist with the movement of your bowels.  Take with a full glass of water.  Use this product as needed if not relieved by Colace only.  MiraLax (polyethylene glycol) - Pick up over-the-counter to have on hand. MiraLax is a solution that will increase the amount of water in your bowels to assist with bowel movements.  Take as directed and can mix with a glass of water, juice, soda, coffee, or tea. Take if you go more than two days without a movement. Do not use MiraLax more than once per day. Call your doctor if you are still constipated or irregular after using this medication for 7 days in a row.  If you continue  to have problems with postoperative constipation, please contact the office for further assistance and recommendations.  If you experience "the worst abdominal pain ever" or develop nausea or vomiting, please contact the office immediatly for further recommendations for treatment.  ITCHING If you experience itching with your medications, try taking only a single pain pill, or even half a pain pill at a time.  You can also use Benadryl over the counter for itching or also to help with sleep.   MEDICATIONS See your medication summary on the "After Visit Summary" that the nursing staff will review with you prior to discharge.  You may have some home medications which will be placed on hold until you complete the course of blood thinner medication.  It is important for you to complete the blood thinner medication as prescribed by your surgeon.  Continue your approved medications as instructed at time of discharge.  PRECAUTIONS If you experience chest pain or shortness of breath - call 911 immediately for transfer to the hospital emergency department.  If you develop a fever greater that   101 F, purulent drainage from wound, increased redness or drainage from wound, foul odor from the wound/dressing, or calf pain - CONTACT YOUR SURGEON.                                                   FOLLOW-UP APPOINTMENTS Make sure you keep all of your appointments after your operation with your surgeon and caregivers. You should call the office at the above phone number and make an appointment for approximately two weeks after the date of your surgery or on the date instructed by your surgeon outlined in the "After Visit Summary".  RANGE OF MOTION AND STRENGTHENING EXERCISES  Rehabilitation of the knee is important following a knee injury or an operation. After just a few days of immobilization, the muscles of the thigh which control the knee become weakened and shrink (atrophy). Knee exercises are designed to build up  the tone and strength of the thigh muscles and to improve knee motion. Often times heat used for twenty to thirty minutes before working out will loosen up your tissues and help with improving the range of motion but do not use heat for the first two weeks following surgery. These exercises can be done on a training (exercise) mat, on the floor, on a table or on a bed. Use what ever works the best and is most comfortable for you Knee exercises include:  Leg Lifts - While your knee is still immobilized in a splint or cast, you can do straight leg raises. Lift the leg to 60 degrees, hold for 3 sec, and slowly lower the leg. Repeat 10-20 times 2-3 times daily. Perform this exercise against resistance later as your knee gets better.  Quad and Hamstring Sets - Tighten up the muscle on the front of the thigh (Quad) and hold for 5-10 sec. Repeat this 10-20 times hourly. Hamstring sets are done by pushing the foot backward against an object and holding for 5-10 sec. Repeat as with quad sets.  Leg Slides: Lying on your back, slowly slide your foot toward your buttocks, bending your knee up off the floor (only go as far as is comfortable). Then slowly slide your foot back down until your leg is flat on the floor again. Angel Wings: Lying on your back spread your legs to the side as far apart as you can without causing discomfort.  A rehabilitation program following serious knee injuries can speed recovery and prevent re-injury in the future due to weakened muscles. Contact your doctor or a physical therapist for more information on knee rehabilitation.   POST-OPERATIVE OPIOID TAPER INSTRUCTIONS: It is important to wean off of your opioid medication as soon as possible. If you do not need pain medication after your surgery it is ok to stop day one. Opioids include: Codeine, Hydrocodone(Norco, Vicodin), Oxycodone(Percocet, oxycontin) and hydromorphone amongst others.  Long term and even short term use of opiods can  cause: Increased pain response Dependence Constipation Depression Respiratory depression And more.  Withdrawal symptoms can include Flu like symptoms Nausea, vomiting And more Techniques to manage these symptoms Hydrate well Eat regular healthy meals Stay active Use relaxation techniques(deep breathing, meditating, yoga) Do Not substitute Alcohol to help with tapering If you have been on opioids for less than two weeks and do not have pain than it is ok to stop all together.    Plan to wean off of opioids This plan should start within one week post op of your joint replacement. Maintain the same interval or time between taking each dose and first decrease the dose.  Cut the total daily intake of opioids by one tablet each day Next start to increase the time between doses. The last dose that should be eliminated is the evening dose.   IF YOU ARE TRANSFERRED TO A SKILLED REHAB FACILITY If the patient is transferred to a skilled rehab facility following release from the hospital, a list of the current medications will be sent to the facility for the patient to continue.  When discharged from the skilled rehab facility, please have the facility set up the patient's Home Health Physical Therapy prior to being released. Also, the skilled facility will be responsible for providing the patient with their medications at time of release from the facility to include their pain medication, the muscle relaxants, and their blood thinner medication. If the patient is still at the rehab facility at time of the two week follow up appointment, the skilled rehab facility will also need to assist the patient in arranging follow up appointment in our office and any transportation needs.  MAKE SURE YOU:  Understand these instructions.  Get help right away if you are not doing well or get worse.   DENTAL ANTIBIOTICS:  In most cases prophylactic antibiotics for Dental procdeures after total joint surgery are  not necessary.  Exceptions are as follows:  1. History of prior total joint infection  2. Severely immunocompromised (Organ Transplant, cancer chemotherapy, Rheumatoid biologic meds such as Humera)  3. Poorly controlled diabetes (A1C &gt; 8.0, blood glucose over 200)  If you have one of these conditions, contact your surgeon for an antibiotic prescription, prior to your dental procedure.    Pick up stool softner and laxative for home use following surgery while on pain medications. Do not submerge incision under water. Please use good hand washing techniques while changing dressing each day. May shower starting three days after surgery. Please use a clean towel to pat the incision dry following showers. Continue to use ice for pain and swelling after surgery. Do not use any lotions or creams on the incision until instructed by your surgeon.  

## 2023-04-08 NOTE — Op Note (Signed)
OPERATIVE REPORT-TOTAL KNEE ARTHROPLASTY   Pre-operative diagnosis- Osteoarthritis  Right knee(s)  Post-operative diagnosis- Osteoarthritis Right knee(s)  Procedure-  Right  Total Knee Arthroplasty  Surgeon- Gus Rankin. Falecia Vannatter, MD  Assistant- Weston Brass, PA-C   Anesthesia-   Adductor canal block and spinal  EBL-50 mL   Drains None  Tourniquet time-  Total Tourniquet Time Documented: Thigh (Right) - 35 minutes Total: Thigh (Right) - 35 minutes     Complications- None  Condition-PACU - hemodynamically stable.   Brief Clinical Note  [name] is a 77 y.o. year old female with end stage OA of her right knee with progressively worsening pain and dysfunction. She has constant pain, with activity and at rest and significant functional deficits with difficulties even with ADLs. She has had extensive non-op management including analgesics, injections of cortisone and viscosupplements, and home exercise program, but remains in significant pain with significant dysfunction.Radiographs show bone on bone arthritis lateral and patellofemoral. She presents now for right Total Knee Arthroplasty.     Procedure in detail---   The patient is brought into the operating room and positioned supine on the operating table. After successful administration of  Adductor canal block and spinal,   a tourniquet is placed high on the  Right thigh(s) and the lower extremity is prepped and draped in the usual sterile fashion. Time out is performed by the operating team and then the  Right lower extremity is wrapped in Esmarch, knee flexed and the tourniquet inflated to 300 mmHg.       A midline incision is made with a ten blade through the subcutaneous tissue to the level of the extensor mechanism. A fresh blade is used to make a medial parapatellar arthrotomy. Soft tissue over the proximal medial tibia is subperiosteally elevated to the joint line with a knife and into the semimembranosus bursa with  a Cobb elevator. Soft tissue over the proximal lateral tibia is elevated with attention being paid to avoiding the patellar tendon on the tibial tubercle. The patella is everted, knee flexed 90 degrees and the ACL and PCL are removed. Findings are bone on bone lateral and patellofemoral with large global osteophytes.        The drill is used to create a starting hole in the distal femur and the canal is thoroughly irrigated with sterile saline to remove the fatty contents. The 5 degree Right  valgus alignment guide is placed into the femoral canal and the distal femoral cutting block is pinned to remove 9 mm off the distal femur. Resection is made with an oscillating saw.      The tibia is subluxed forward and the menisci are removed. The extramedullary alignment guide is placed referencing proximally at the medial aspect of the tibial tubercle and distally along the second metatarsal axis and tibial crest. The block is pinned to remove 2mm off the more deficient lateral  side. Resection is made with an oscillating saw. Size 5is the most appropriate size for the tibia and the proximal tibia is prepared with the modular drill and keel punch for that size.      The femoral sizing guide is placed and size 5 is most appropriate. Rotation is marked off the epicondylar axis and confirmed by creating a rectangular flexion gap at 90 degrees. The size 5 cutting block is pinned in this rotation and the anterior, posterior and chamfer cuts are made with the oscillating saw. The intercondylar block is then placed and that cut is made.  Trial size 5 tibial component, trial size 5 posterior stabilized femur and a 6  mm posterior stabilized rotating platform insert trial is placed. Full extension is achieved with excellent varus/valgus and anterior/posterior balance throughout full range of motion. The patella is everted and thickness measured to be 22  mm. Free hand resection is taken to 12 mm, a 35 template is placed,  lug holes are drilled, trial patella is placed, and it tracks normally. Osteophytes are removed off the posterior femur with the trial in place. All trials are removed and the cut bone surfaces prepared with pulsatile lavage. Cement is mixed and once ready for implantation, the size 5 tibial implant, size  5 posterior stabilized femoral component, and the size 35 patella are cemented in place and the patella is held with the clamp. The trial insert is placed and the knee held in full extension. The Exparel (20 ml mixed with 60 ml saline) is injected into the extensor mechanism, posterior capsule, medial and lateral gutters and subcutaneous tissues.  All extruded cement is removed and once the cement is hard the permanent 7 mm posterior stabilized rotating platform insert is placed into the tibial tray.      The wound is copiously irrigated with saline solution and the extensor mechanism closed with # 0 Stratofix suture. The tourniquet is released for a total tourniquet time of 35  minutes. Flexion against gravity is 140 degrees and the patella tracks normally. Subcutaneous tissue is closed with 2.0 vicryl and subcuticular with running 4.0 Monocryl. The incision is cleaned and dried and steri-strips and a bulky sterile dressing are applied. The limb is placed into a knee immobilizer and the patient is awakened and transported to recovery in stable condition.      Please note that a surgical assistant was a medical necessity for this procedure in order to perform it in a safe and expeditious manner. Surgical assistant was necessary to retract the ligaments and vital neurovascular structures to prevent injury to them and also necessary for proper positioning of the limb to allow for anatomic placement of the prosthesis.   Gus Rankin Tywanna Seifer, MD    04/08/2023, 11:45 AM

## 2023-04-08 NOTE — Progress Notes (Signed)
Orthopedic Tech Progress Note Patient Details:  CALEEN SNOWMAN [birth year] [id number]  Applied CPM. CPM Right Knee CPM Right Knee: On Right Knee Flexion (Degrees): 40 Right Knee Extension (Degrees): 10  Post Interventions Patient Tolerated: Well Instructions Provided: Care of device, Adjustment of device  Sherilyn Banker 04/08/2023, 12:14 PM

## 2023-04-08 NOTE — Anesthesia Postprocedure Evaluation (Signed)
Anesthesia Post Note  Patient: [name]  Procedure(s) Performed: TOTAL KNEE ARTHROPLASTY (Right: Knee)     Patient location during evaluation: PACU Anesthesia Type: Spinal and Regional Level of consciousness: oriented and awake and alert Pain management: pain level controlled Vital Signs Assessment: post-procedure vital signs reviewed and stable Respiratory status: spontaneous breathing and respiratory function stable Cardiovascular status: blood pressure returned to baseline and stable Postop Assessment: no headache, no backache, no apparent nausea or vomiting, spinal receding and patient able to bend at knees Anesthetic complications: no  No notable events documented.  Last Vitals:  Vitals:   04/08/23 1245 04/08/23 1300  BP: 137/70 (!) 144/75  Pulse: 73 70  Resp: 18 14  Temp:    SpO2: 99% 100%    Last Pain:  Vitals:   04/08/23 1245  TempSrc:   PainSc: 0-No pain                 Aurilla Coulibaly,W. EDMOND

## 2023-04-08 NOTE — Anesthesia Procedure Notes (Signed)
Anesthesia Regional Block: Adductor canal block   Pre-Anesthetic Checklist: , timeout performed,  Correct Patient, Correct Site, Correct Laterality,  Correct Procedure, Correct Position, site marked,  Risks and benefits discussed,  Pre-op evaluation,  At surgeon's request and post-op pain management  Laterality: Right  Prep: Maximum Sterile Barrier Precautions used, chloraprep       Needles:  Injection technique: Single-shot  Needle Type: Echogenic Stimulator Needle     Needle Length: 9cm  Needle Gauge: 21     Additional Needles:   Procedures:,,,, ultrasound used (permanent image in chart),,    Narrative:  Start time: 04/08/2023 9:32 AM End time: 04/08/2023 9:42 AM Injection made incrementally with aspirations every 5 mL.  Performed by: Personally  Anesthesiologist: Gaynelle Adu, MD  Additional Notes:

## 2023-04-08 NOTE — Interval H&P Note (Signed)
History and Physical Interval Note:  04/08/2023 8:34 AM  [name]  has presented today for surgery, with the diagnosis of right knee osteoarthritis.  The various methods of treatment have been discussed with the patient and family. After consideration of risks, benefits and other options for treatment, the patient has consented to  Procedure(s): TOTAL KNEE ARTHROPLASTY (Right) as a surgical intervention.  The patient's history has been reviewed, patient examined, no change in status, stable for surgery.  I have reviewed the patient's chart and labs.  Questions were answered to the patient's satisfaction.     Homero Fellers Koah Chisenhall

## 2023-04-08 NOTE — Evaluation (Signed)
Physical Therapy Evaluation Patient Details Name: [name] MRN: [id number] DOB: 05/07/1946 Today's Date: 04/08/2023  History of Present Illness  77 yo female presents to therapy s/p R TKA on 04/08/2023 due to failure of conservative measures. Pt PMH includes but is not limited to: sjorgrens syndrome, SOB, adenoma of appendiceal orifice, compression fx L 1, lupus, and OA.  Clinical Impression    VICTORINA GROTHEER is a 77 y.o. female POD 0 s/p R TKA. Patient reports IND with mobility at baseline. Patient is now limited by functional impairments (see PT problem list below) and requires min guard for bed mobility and min guard and cues for transfers. Patient was able to ambulate 25 feet with RW and min guard with recliner close due to reports of dizziness. Patient instructed in exercise to facilitate ROM and circulation to manage edema. Patient will benefit from continued skilled PT interventions to address impairments and progress towards PLOF. Acute PT will follow to progress mobility and stair training in preparation for safe discharge home with family support and OPPT services.      Recommendations for follow up therapy are one component of a multi-disciplinary discharge planning process, led by the attending physician.  Recommendations may be updated based on patient status, additional functional criteria and insurance authorization.  Follow Up Recommendations       Assistance Recommended at Discharge Intermittent Supervision/Assistance  Patient can return home with the following  A little help with walking and/or transfers;A little help with bathing/dressing/bathroom;Assistance with cooking/housework;Assist for transportation;Help with stairs or ramp for entrance    Equipment Recommendations None recommended by PT (pt reports DME in home setting)  Recommendations for Other Services       Functional Status Assessment Patient has had a recent decline in their functional status and  demonstrates the ability to make significant improvements in function in a reasonable and predictable amount of time.     Precautions / Restrictions Precautions Precautions: Knee;Fall Restrictions Weight Bearing Restrictions: No      Mobility  Bed Mobility Overal bed mobility: Needs Assistance Bed Mobility: Supine to Sit     Supine to sit: Min guard, HOB elevated     General bed mobility comments: min cues and increased time    Transfers Overall transfer level: Needs assistance Equipment used: Rolling walker (2 wheels) Transfers: Sit to/from Stand Sit to Stand: Min guard           General transfer comment: pt reported mild dizziness with transitioning from supine to sit and from sit to stand, cues for proper UE and AD placement    Ambulation/Gait Ambulation/Gait assistance: Min guard Gait Distance (Feet): 25 Feet Assistive device: Rolling walker (2 wheels) Gait Pattern/deviations: Step-to pattern, Antalgic, Trunk flexed Gait velocity: decreased        Stairs            Wheelchair Mobility    Modified Rankin (Stroke Patients Only)       Balance Overall balance assessment: Needs assistance Sitting-balance support: Feet supported Sitting balance-Leahy Scale: Good     Standing balance support: Bilateral upper extremity supported, During functional activity, Reliant on assistive device for balance Standing balance-Leahy Scale: Poor                               Pertinent Vitals/Pain Pain Assessment Pain Assessment: 0-10 Pain Score: 4  Pain Location: R knee Pain Descriptors / Indicators: Aching, Discomfort, Dull, Operative site guarding Pain  Intervention(s): Limited activity within patient's tolerance, Monitored during session, Premedicated before session, Repositioned, Ice applied    Home Living Family/patient expects to be discharged to:: Private residence Living Arrangements: Spouse/significant other Available Help at  Discharge: Family Type of Home: House Home Access: Stairs to enter Entrance Stairs-Rails: Left Entrance Stairs-Number of Steps: 3   Home Layout: One level Home Equipment: Agricultural consultant (2 wheels);Cane - single point;Shower seat      Prior Function Prior Level of Function : Independent/Modified Independent;Driving             Mobility Comments: IND with all ADLs self care tasks and IADLs       Hand Dominance        Extremity/Trunk Assessment        Lower Extremity Assessment Lower Extremity Assessment: RLE deficits/detail RLE Deficits / Details: ankle DF/PF 5/5; SLR < 10 degree lag RLE Sensation: WNL    Cervical / Trunk Assessment Cervical / Trunk Assessment:  (wfl)  Communication   Communication: No difficulties  Cognition Arousal/Alertness: Awake/alert Behavior During Therapy: WFL for tasks assessed/performed Overall Cognitive Status: Within Functional Limits for tasks assessed                                          General Comments      Exercises Total Joint Exercises Ankle Circles/Pumps: AROM, Both, 20 reps   Assessment/Plan    PT Assessment Patient needs continued PT services  PT Problem List Decreased strength;Decreased range of motion;Decreased activity tolerance;Decreased balance;Decreased mobility;Decreased coordination;Pain       PT Treatment Interventions DME instruction;Gait training;Stair training;Functional mobility training;Therapeutic activities;Therapeutic exercise;Balance training;Neuromuscular re-education;Patient/family education;Modalities    PT Goals (Current goals can be found in the Care Plan section)  Acute Rehab PT Goals Patient Stated Goal: to walk without pain PT Goal Formulation: With patient Time For Goal Achievement: 04/22/23 Potential to Achieve Goals: Good    Frequency 7X/week     Co-evaluation               AM-PAC PT "6 Clicks" Mobility  Outcome Measure Help needed turning from your  back to your side while in a flat bed without using bedrails?: A Little Help needed moving from lying on your back to sitting on the side of a flat bed without using bedrails?: A Little Help needed moving to and from a bed to a chair (including a wheelchair)?: A Little Help needed standing up from a chair using your arms (e.g., wheelchair or bedside chair)?: A Little Help needed to walk in hospital room?: A Little Help needed climbing 3-5 steps with a railing? : A Lot 6 Click Score: 17    End of Session Equipment Utilized During Treatment: Gait belt Activity Tolerance: Other (comment) (increased pain and reports of light headedness) Patient left: in chair;with call bell/phone within reach;with family/visitor present Nurse Communication: Mobility status PT Visit Diagnosis: Unsteadiness on feet (R26.81);Other abnormalities of gait and mobility (R26.89);Muscle weakness (generalized) (M62.81);Pain Pain - Right/Left: Right Pain - part of body: Knee    Time: 1610-9604 PT Time Calculation (min) (ACUTE ONLY): 31 min   Charges:   PT Evaluation $PT Eval Low Complexity: 1 Low PT Treatments $Gait Training: 8-22 mins        Johnny Bridge, PT Acute Rehab   Jacqualyn Posey 04/08/2023, 6:57 PM

## 2023-04-08 NOTE — Transfer of Care (Signed)
Immediate Anesthesia Transfer of Care Note  Patient: [name]  Procedure(s) Performed: TOTAL KNEE ARTHROPLASTY (Right: Knee)  Patient Location: PACU  Anesthesia Type:Spinal  Level of Consciousness: awake, alert , and oriented  Airway & Oxygen Therapy: Patient Spontanous Breathing and Patient connected to face mask oxygen  Post-op Assessment: Report given to RN and Post -op Vital signs reviewed and stable  Post vital signs: Reviewed and stable  Last Vitals:  Vitals Value Taken Time  BP 118/72 04/08/23 1204  Temp    Pulse 74 04/08/23 1206  Resp 17 04/08/23 1206  SpO2 100 % 04/08/23 1206  Vitals shown include unvalidated device data.  Last Pain:  Vitals:   04/08/23 0943  TempSrc:   PainSc: 0-No pain         Complications: No notable events documented.

## 2023-04-08 NOTE — Anesthesia Preprocedure Evaluation (Addendum)
Anesthesia Evaluation  Patient identified by MRN, date of birth, ID band Patient awake    Reviewed: Allergy & Precautions, H&P , NPO status , Patient's Chart, lab work & pertinent test results  Airway Mallampati: II  TM Distance: >3 FB Neck ROM: Full    Dental no notable dental hx. (+) Dental Advisory Given, Teeth Intact, Caps,    Pulmonary neg pulmonary ROS   Pulmonary exam normal breath sounds clear to auscultation       Cardiovascular negative cardio ROS  Rhythm:Regular Rate:Normal     Neuro/Psych   Anxiety     negative neurological ROS     GI/Hepatic negative GI ROS, Neg liver ROS,,,  Endo/Other  Hypothyroidism    Renal/GU negative Renal ROS  negative genitourinary   Musculoskeletal  (+) Arthritis , Osteoarthritis,    Abdominal   Peds  Hematology negative hematology ROS (+)   Anesthesia Other Findings   Reproductive/Obstetrics negative OB ROS                             Anesthesia Physical Anesthesia Plan  ASA: 2  Anesthesia Plan: Spinal   Post-op Pain Management: Regional block* and Ofirmev IV (intra-op)*   Induction: Intravenous  PONV Risk Score and Plan: 3 and Ondansetron, Dexamethasone and Propofol infusion  Airway Management Planned: Natural Airway and Simple Face Mask  Additional Equipment:   Intra-op Plan:   Post-operative Plan:   Informed Consent: I have reviewed the patients History and Physical, chart, labs and discussed the procedure including the risks, benefits and alternatives for the proposed anesthesia with the patient or authorized representative who has indicated his/her understanding and acceptance.     Dental advisory given  Plan Discussed with: CRNA  Anesthesia Plan Comments:        Anesthesia Quick Evaluation

## 2023-04-08 NOTE — Anesthesia Procedure Notes (Signed)
Spinal  Patient location during procedure: OR Start time: 04/08/2023 10:30 AM End time: 04/08/2023 10:36 AM Reason for block: surgical anesthesia Staffing Performed: anesthesiologist  Anesthesiologist: Gaynelle Adu, MD Performed by: Gaynelle Adu, MD Authorized by: Gaynelle Adu, MD   Preanesthetic Checklist Completed: patient identified, IV checked, risks and benefits discussed, surgical consent, monitors and equipment checked, pre-op evaluation and timeout performed Spinal Block Patient position: sitting Prep: DuraPrep Patient monitoring: cardiac monitor, continuous pulse ox and blood pressure Approach: midline Location: L3-4 Injection technique: single-shot Needle Needle type: Pencan  Needle gauge: 24 G Needle length: 9 cm Assessment Sensory level: T8 Events: CSF return Additional Notes Functioning IV was confirmed and monitors were applied. Sterile prep and drape, including hand hygiene and sterile gloves were used. The patient was positioned and the spine was prepped. The skin was anesthetized with lidocaine.  Free flow of clear CSF was obtained prior to injecting local anesthetic into the CSF.  The spinal needle aspirated freely following injection.  The needle was carefully withdrawn.  The patient tolerated the procedure well.

## 2023-04-09 ENCOUNTER — Encounter (HOSPITAL_COMMUNITY): Payer: Self-pay | Admitting: Orthopedic Surgery

## 2023-04-09 DIAGNOSIS — M1711 Unilateral primary osteoarthritis, right knee: Secondary | ICD-10-CM | POA: Diagnosis not present

## 2023-04-09 LAB — CBC
HCT: 34 % — ABNORMAL LOW (ref 36.0–46.0)
Hemoglobin: 11.2 g/dL — ABNORMAL LOW (ref 12.0–15.0)
MCH: 32.9 pg (ref 26.0–34.0)
MCHC: 32.9 g/dL (ref 30.0–36.0)
MCV: 100 fL (ref 80.0–100.0)
Platelets: 179 10*3/uL (ref 150–400)
RBC: 3.4 MIL/uL — ABNORMAL LOW (ref 3.87–5.11)
RDW: 12.8 % (ref 11.5–15.5)
WBC: 13.7 10*3/uL — ABNORMAL HIGH (ref 4.0–10.5)
nRBC: 0 % (ref 0.0–0.2)

## 2023-04-09 LAB — BASIC METABOLIC PANEL
Anion gap: 5 (ref 5–15)
BUN: 13 mg/dL (ref 8–23)
CO2: 24 mmol/L (ref 22–32)
Calcium: 8.1 mg/dL — ABNORMAL LOW (ref 8.9–10.3)
Chloride: 110 mmol/L (ref 98–111)
Creatinine, Ser: 0.58 mg/dL (ref 0.44–1.00)
GFR, Estimated: >60 mL/min (ref >60)
Glucose, Bld: 135 mg/dL — ABNORMAL HIGH (ref 70–99)
Potassium: 4 mmol/L (ref 3.5–5.1)
Sodium: 139 mmol/L (ref 135–145)

## 2023-04-09 MED ORDER — OXYCODONE HCL 5 MG PO TABS: 5.0000 mg | ORAL_TABLET | Freq: Four times a day (QID) | ORAL | 0 refills | Status: DC | PRN

## 2023-04-09 MED ORDER — ONDANSETRON HCL 4 MG PO TABS: 4.0000 mg | ORAL_TABLET | Freq: Four times a day (QID) | ORAL | 0 refills | Status: DC | PRN

## 2023-04-09 MED ORDER — TRAMADOL HCL 50 MG PO TABS: 50.0000 mg | ORAL_TABLET | Freq: Three times a day (TID) | ORAL | 0 refills | Status: AC | PRN

## 2023-04-09 MED ORDER — ASPIRIN 81 MG PO CHEW: 81.0000 mg | CHEWABLE_TABLET | Freq: Two times a day (BID) | ORAL | 0 refills | Status: AC

## 2023-04-09 MED ORDER — METHOCARBAMOL 500 MG PO TABS: 500.0000 mg | ORAL_TABLET | Freq: Four times a day (QID) | ORAL | 0 refills | Status: DC | PRN

## 2023-04-09 NOTE — TOC Transition Note (Signed)
Transition of Care [facility]) - CM/SW Discharge Note  Patient Details  Name: SANJA OSSMAN MRN: [id number] Date of Birth: [birth year]  [facility] CM/SW Contact:  Ewing Schlein, LCSW Phone Number: 04/09/2023, 10:18 AM  Clinical Narrative: Patient is expected to discharge home after working with PT. CSW met with patient to confirm discharge plan. Patient will go home with OPPT at [facility]. Patient has a rolling walker, shower stool, and 3N1 at home so there are no DME needs at this time. TOC signing off.  Final next level of care: OP Rehab Barriers to Discharge: No Barriers Identified  Patient Goals and CMS Choice Choice offered to / list presented to : NA  Discharge Plan and Services Additional resources added to the After Visit Summary for         DME Arranged: N/A DME Agency: NA  Social Determinants of Health (SDOH) Interventions SDOH Screenings   Food Insecurity: No Food Insecurity (04/08/2023)  Housing: Low Risk  (04/08/2023)  Transportation Needs: No Transportation Needs (04/08/2023)  Utilities: Not At Risk (04/08/2023)  Tobacco Use: Low Risk  (04/08/2023)   Readmission Risk Interventions     No data to display

## 2023-04-09 NOTE — Progress Notes (Signed)
Patient discharged to home w/ family. Given all belongings, instructions, equipment. Verbalized understanding of all instructions. Escorted to pov via w/c. 

## 2023-04-09 NOTE — Care Management Obs Status (Signed)
MEDICARE OBSERVATION STATUS NOTIFICATION   Patient Details  Name: [name] MRN: [id number] Date of Birth: 25-Nov-1945   Medicare Observation Status Notification Given:       Ewing Schlein, [place] 04/09/2023, 9:58 AM

## 2023-04-09 NOTE — Progress Notes (Signed)
Subjective: 1 Day Post-Op Procedure(s) (LRB): TOTAL KNEE ARTHROPLASTY (Right) Patient reports pain as mild.   Patient seen in rounds by Dr. Lequita Halt. Patient is well, and has had no acute complaints or problems No issues overnight. Denies chest pain, SOB, or calf pain. Foley catheter removed this AM.  We will continue therapy today, ambulated 25' yesterday.   Objective: Vital signs in last 24 hours: Temp:  [97.5 F (36.4 C)-98.2 F (36.8 C)] 98.1 F (36.7 C) (06/18 0615) Pulse Rate:  [61-86] 62 (06/18 0615) Resp:  [11-22] 16 (06/18 0615) BP: (116-154)/(58-88) 116/66 (06/18 0615) SpO2:  [94 %-100 %] 99 % (06/18 0615) Weight:  [66 kg] 66 kg (06/17 0827)  Intake/Output from previous day:  Intake/Output Summary (Last 24 hours) at 04/09/2023 0726 Last data filed at 04/09/2023 0600 Gross per 24 hour  Intake 3344.77 ml  Output 3725 ml  Net -380.23 ml     Intake/Output this shift: No intake/output data recorded.  Labs: Recent Labs    04/09/23 0335  HGB 11.2*   Recent Labs    04/09/23 0335  WBC 13.7*  RBC 3.40*  HCT 34.0*  PLT 179   Recent Labs    04/09/23 0335  NA 139  K 4.0  CL 110  CO2 24  BUN 13  CREATININE 0.58  GLUCOSE 135*  CALCIUM 8.1*   No results for input(s): "LABPT", "INR" in the last 72 hours.  Exam: General - Patient is Alert and Oriented Extremity - Neurologically intact Neurovascular intact Sensation intact distally Dorsiflexion/Plantar flexion intact Dressing - dressing C/D/I Motor Function - intact, moving foot and toes well on exam.   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   Weight as of this encounter: 66 kg. Advance diet Up with therapy D/C IV fluids   Patient's anticipated LOS is less than 2 midnights, meeting these requirements: - Younger than 30 - Lives within 1 hour of care - Has a competent adult at home to recover with post-op recover - NO history of  - Chronic pain requiring opioids  - Diabetes  - Coronary Artery Disease  - Heart failure  - Heart attack  - Stroke  - DVT/VTE  - Cardiac arrhythmia  - Respiratory Failure/COPD  - Renal failure  - Anemia  - Advanced Liver disease  DVT Prophylaxis - Aspirin Weight bearing as tolerated. Continue therapy.  Plan is to go Home after hospital stay. Plan for discharge later today if progresses with therapy and meeting goals. Scheduled for OPP[facility] at [facility]). Follow-up in the office in 2 weeks.  The PDMP database was reviewed today prior to any opioid medications  being prescribed to this patient.  Arther Abbott, PA-C Orthopedic Surgery [phone] 04/09/2023, 7:26 AM

## 2023-04-09 NOTE — Progress Notes (Addendum)
Physical Therapy Treatment Patient Details Name: [name] MRN: [id number] DOB: May 11, 1946 Today's Date: 04/09/2023   History of Present Illness 77 yo female presents to therapy s/p R TKA on 04/08/2023 due to failure of conservative measures. Pt PMH includes but is not limited to: sjorgrens syndrome, SOB, adenoma of appendiceal orifice, compression fx L 1, lupus, and OA.    PT Comments    POD # 1 am session General Comments: AxO x 3 very pleasant Lady who lives home with Spouse.  Pt was already OOB in recliner.  Assisted with amb.  General Gait Details: VC's on proper sequencing as well as upright posture.  General transfer comment: VC's on proper hand placement as well as safety with turns.  Also asissted with a toilet transfer. Then returned to room to perform some TE's following HEP handout.  Instructed on proper tech, freq as well as use of ICE.   Pt will need another PT session with Spouse to practice stairs before D/C to home later today.    Recommendations for follow up therapy are one component of a multi-disciplinary discharge planning process, led by the attending physician.  Recommendations may be updated based on patient status, additional functional criteria and insurance authorization.  Follow Up Recommendations       Assistance Recommended at Discharge Intermittent Supervision/Assistance  Patient can return home with the following A little help with walking and/or transfers;A little help with bathing/dressing/bathroom;Assistance with cooking/housework;Assist for transportation;Help with stairs or ramp for entrance   Equipment Recommendations  None recommended by PT    Recommendations for Other Services       Precautions / Restrictions Precautions Precautions: Knee;Fall Precaution Comments: no pillow under knee Restrictions Weight Bearing Restrictions: No RLE Weight Bearing: Weight bearing as tolerated     Mobility  Bed Mobility               General  bed mobility comments: OOB in recliner    Transfers Overall transfer level: Needs assistance Equipment used: Rolling walker (2 wheels) Transfers: Sit to/from Stand Sit to Stand: Supervision           General transfer comment: VC's on proper hand placement as well as safety with turns.  Also asissted with a toilet transfer.    Ambulation/Gait Ambulation/Gait assistance: Supervision, Min guard Gait Distance (Feet): 40 Feet Assistive device: Rolling walker (2 wheels) Gait Pattern/deviations: Step-to pattern, Antalgic, Trunk flexed Gait velocity: decreased     General Gait Details: VC's on proper sequencing as well as upright posture.   Stairs             Wheelchair Mobility    Modified Rankin (Stroke Patients Only)       Balance                                            Cognition Arousal/Alertness: Awake/alert Behavior During Therapy: WFL for tasks assessed/performed Overall Cognitive Status: Within Functional Limits for tasks assessed                                 General Comments: AxO x 3 very pleasant Lady who lives home with Spouse.        Exercises  Total Knee Replacement TE's following HEP handout 10 reps B LE ankle pumps 05 reps towel squeezes 05 reps  knee presses 05 reps heel slides  05 reps SAQ's 05 reps SLR's 05 reps ABD Educated on use of gait belt to assist with TE's Followed by ICE     General Comments        Pertinent Vitals/Pain Pain Assessment Pain Assessment: 0-10 Pain Score: 5  Pain Location: R knee Pain Descriptors / Indicators: Aching, Discomfort, Dull, Operative site guarding Pain Intervention(s): Monitored during session, Repositioned, Ice applied, Premedicated before session    Home Living                          Prior Function            PT Goals (current goals can now be found in the care plan section) Progress towards PT goals: Progressing toward goals     Frequency    7X/week      PT Plan Current plan remains appropriate    Co-evaluation              AM-PAC PT "6 Clicks" Mobility   Outcome Measure  Help needed turning from your back to your side while in a flat bed without using bedrails?: A Little Help needed moving from lying on your back to sitting on the side of a flat bed without using bedrails?: A Little Help needed moving to and from a bed to a chair (including a wheelchair)?: A Little Help needed standing up from a chair using your arms (e.g., wheelchair or bedside chair)?: A Little Help needed to walk in hospital room?: A Little Help needed climbing 3-5 steps with a railing? : A Little 6 Click Score: 18    End of Session Equipment Utilized During Treatment: Gait belt Activity Tolerance: Patient tolerated treatment well Patient left: in chair;with call bell/phone within reach;with family/visitor present Nurse Communication: Mobility status PT Visit Diagnosis: Unsteadiness on feet (R26.81);Other abnormalities of gait and mobility (R26.89);Muscle weakness (generalized) (M62.81);Pain Pain - Right/Left: Right Pain - part of body: Knee     Time: 1610-9604 PT Time Calculation (min) (ACUTE ONLY): 31 min  Charges:  $Gait Training: 8-22 mins $Therapeutic Exercise: 8-22 mins                     Felecia Shelling  PTA Acute  Rehabilitation Services Office M-F          [phone]

## 2023-04-09 NOTE — Progress Notes (Signed)
Physical Therapy Treatment Patient Details Name: [name] MRN: [id number] DOB: 1946/10/16 Today's Date: 04/09/2023   History of Present Illness 77 yo female presents to therapy s/p R TKA on 04/08/2023 due to failure of conservative measures. Pt PMH includes but is not limited to: sjorgrens syndrome, SOB, adenoma of appendiceal orifice, compression fx L 1, lupus, and OA.    PT Comments    POD # 1 pm session with Spouse for "Family Training" Had Spouse "hands on" assist pt with all activity including stair training using a safety belt.  Performed remaining seated TE's following HEP handout.  Instructed on proper tech. Frequency as well as use of ICE. Addressed all mobility questions, discussed appropriate activity, educated on use of ICE.  Pt ready for D/C to home.    Recommendations for follow up therapy are one component of a multi-disciplinary discharge planning process, led by the attending physician.  Recommendations may be updated based on patient status, additional functional criteria and insurance authorization.  Follow Up Recommendations       Assistance Recommended at Discharge Intermittent Supervision/Assistance  Patient can return home with the following A little help with walking and/or transfers;A little help with bathing/dressing/bathroom;Assistance with cooking/housework;Assist for transportation;Help with stairs or ramp for entrance   Equipment Recommendations  None recommended by PT    Recommendations for Other Services       Precautions / Restrictions Precautions Precautions: Knee;Fall Precaution Comments: no pillow under knee Restrictions Weight Bearing Restrictions: No RLE Weight Bearing: Weight bearing as tolerated     Mobility  Bed Mobility               General bed mobility comments: OOB in recliner    Transfers Overall transfer level: Needs assistance Equipment used: Rolling walker (2 wheels) Transfers: Sit to/from Stand Sit to  Stand: Supervision           General transfer comment: had Spouse "hands on" assist pt using safety belt with VC's on safe handling    Ambulation/Gait Ambulation/Gait assistance: Supervision, Min guard Gait Distance (Feet): 40 Feet Assistive device: Rolling walker (2 wheels) Gait Pattern/deviations: Step-to pattern, Antalgic, Trunk flexed Gait velocity: decreased     General Gait Details: had Spouse "hands on" assist pt using safety belt with VC's on safe handling   Stairs Stairs: Yes Stairs assistance: Supervision, Min guard Stair Management: One rail Left, Step to pattern, Sideways, Forwards Number of Stairs: 4 General stair comments: had Spouse "hands on" assist pt using safety belt with VC's on safe handling   Wheelchair Mobility    Modified Rankin (Stroke Patients Only)       Balance                                            Cognition Arousal/Alertness: Awake/alert Behavior During Therapy: WFL for tasks assessed/performed Overall Cognitive Status: Within Functional Limits for tasks assessed                                 General Comments: AxO x 3 very pleasant Lady who lives home with Spouse.        Exercises  05 reps all seated TE's following HEP handout    General Comments        Pertinent Vitals/Pain Pain Assessment Pain Assessment: 0-10 Pain Score: 5  Pain Location: R knee Pain Descriptors / Indicators: Aching, Discomfort, Dull, Operative site guarding Pain Intervention(s): Monitored during session, Repositioned, Ice applied, Premedicated before session    Home Living                          Prior Function            PT Goals (current goals can now be found in the care plan section) Progress towards PT goals: Progressing toward goals    Frequency    7X/week      PT Plan Current plan remains appropriate    Co-evaluation              AM-PAC PT "6 Clicks" Mobility   Outcome  Measure  Help needed turning from your back to your side while in a flat bed without using bedrails?: A Little Help needed moving from lying on your back to sitting on the side of a flat bed without using bedrails?: A Little Help needed moving to and from a bed to a chair (including a wheelchair)?: A Little Help needed standing up from a chair using your arms (e.g., wheelchair or bedside chair)?: A Little Help needed to walk in hospital room?: A Little Help needed climbing 3-5 steps with a railing? : A Little 6 Click Score: 18    End of Session Equipment Utilized During Treatment: Gait belt Activity Tolerance: Patient tolerated treatment well Patient left: in chair;with call bell/phone within reach;with family/visitor present Nurse Communication: Mobility status PT Visit Diagnosis: Unsteadiness on feet (R26.81);Other abnormalities of gait and mobility (R26.89);Muscle weakness (generalized) (M62.81);Pain Pain - Right/Left: Right Pain - part of body: Knee     Time: 4098-1191 PT Time Calculation (min) (ACUTE ONLY): 25 min  Charges:  $Gait Training: 8-22 mins $Therapeutic Exercise: 8-22 mins                     Felecia Shelling  PTA Acute  Rehabilitation Services Office M-F          [phone]

## 2023-04-10 NOTE — Discharge Summary (Signed)
Patient ID: [name] MRN: [id number] DOB/AGE: 1946-05-18 77 y.o.  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 female who was admitted 04/08/2023 for operative treatment ofPrimary osteoarthritis of both knees. Patient has severe unremitting pain that affects sleep, daily activities, and work/hobbies. After pre-op clearance the patient was taken to the operating room on 04/08/2023 and underwent  Procedure(s): TOTAL KNEE ARTHROPLASTY.    Patient was given perioperative antibiotics:  Anti-infectives (From admission, onward)    Start     Dose/Rate Route Frequency Ordered Stop   04/09/23 1000  hydroxychloroquine (PLAQUENIL) tablet 200 mg  Status:  Discontinued        200 mg Oral Daily 04/08/23 1318 04/09/23 1913   04/08/23 1630  ceFAZolin (ANCEF) IVPB 2g/100 mL premix        2 g 200 mL/hr over 30 Minutes Intravenous Every 6 hours 04/08/23 1318 04/08/23 2301   04/08/23 0815  ceFAZolin (ANCEF) IVPB 2g/100 mL premix        2 g 200 mL/hr over 30 Minutes Intravenous On call to O.R. 04/08/23 0805 04/08/23 1042        Patient was given sequential compression devices, early ambulation, and  chemoprophylaxis to prevent DVT.  Patient benefited maximally from hospital stay and there were no complications.    Recent vital signs: Patient Vitals for the past 24 hrs:  BP Temp Temp src Pulse Resp SpO2  04/09/23 1038 109/60 97.6 F (36.4 C) Oral 63 17 100 %     Recent laboratory studies:  Recent Labs    04/09/23 0335  WBC 13.7*  HGB 11.2*  HCT 34.0*  PLT 179  NA 139  K 4.0  CL 110  CO2 24  BUN 13  CREATININE 0.58  GLUCOSE 135*  CALCIUM 8.1*     Discharge Medications:   Allergies as of 04/09/2023       Reactions   Boniva [ibandronic Acid] Other (See Comments)   Pain in legs   Chlorhexidine Gluconate    Latex Itching   Sulfonamide Derivatives    REACTION: headache   Bacitracin-polymyxin B Rash        Medication List     STOP taking these medications    aspirin 81 MG tablet Replaced by: aspirin 81 MG chewable tablet   zolpidem 5 MG tablet Commonly known as: AMBIEN       TAKE these medications    acetaminophen 500 MG tablet Commonly known as: TYLENOL Take 500-1,000 mg by mouth every 8 (eight) hours as needed for moderate pain or headache.   aspirin 81 MG chewable tablet Chew 1  tablet (81 mg total) by mouth 2 (two) times daily for 20 days. Then resume one 81 mg aspirin once a day Replaces: aspirin 81 MG tablet   CALCIUM-VITAMIN D3 PO Take 2 tablets by mouth daily.   carboxymethylcellulose 0.5 % Soln Commonly known as: REFRESH PLUS Place 1 drop into both eyes in the morning and at bedtime.   cholecalciferol 25 MCG (1000 UNIT) tablet Commonly known as: VITAMIN D3 Take 1,000 Units by mouth daily.   denosumab 60 MG/ML Sosy injection Commonly known as: PROLIA Inject 60 mg into the skin every 6 (six) months.   escitalopram 10 MG tablet Commonly known as: LEXAPRO TAKE 1 TABLET BY MOUTH DAILY   fexofenadine 180 MG tablet Commonly known as: ALLEGRA Take 180 mg by mouth daily.   Fish Oil 1200 MG Caps Take 1,200 mg by mouth in the  morning, at noon, in the evening, and at bedtime.   fluticasone 50 MCG/ACT nasal spray Commonly known as: FLONASE Place 2 sprays into both nostrils daily.   folic acid 400 MCG tablet Commonly known as: FOLVITE Take 400 mcg by mouth daily.   hydroxychloroquine 200 MG tablet Commonly known as: PLAQUENIL TAKE 1 TABLET BY MOUTH DAILY   ipratropium 0.03 % nasal spray Commonly known as: ATROVENT Place 2 sprays into both nostrils 2 (two) times daily as needed for rhinitis.   levothyroxine 88 MCG tablet Commonly known as: SYNTHROID Take 88-132 mcg by mouth See admin instructions. 88 mcg every morning, except on Sunday 132 mcg   methocarbamol 500 MG tablet Commonly known as: ROBAXIN Take 1 tablet (500 mg total) by mouth every 6 (six) hours as needed for muscle spasms.   multivitamin tablet Take 1 tablet by mouth daily.   ondansetron 4 MG tablet Commonly known as: ZOFRAN Take 1 tablet (4 mg total) by mouth every 6 (six) hours as needed for nausea.   oxyCODONE 5 MG immediate release tablet Commonly known as: Oxy IR/ROXICODONE Take 1-2 tablets (5-10 mg total) by mouth every 6 (six) hours as needed for severe pain.   rosuvastatin 10 MG tablet Commonly known as: CRESTOR Take 10 mg by mouth daily.   thiamine 100 MG tablet Commonly known as: Vitamin B-1 Take 100 mg by mouth daily.   traMADol 50 MG tablet Commonly known as: ULTRAM Take 1 tablet (50 mg total) by mouth every 8 (eight) hours as needed for moderate pain. What changed: when to take this   triamcinolone cream 0.1 % Commonly known as: KENALOG Apply 1 Application topically 2 (two) times daily as needed (irritation).   TURMERIC PO Take 400 mg by mouth daily.               Discharge Care Instructions  (From admission, onward)           Start     Ordered   04/09/23 0000  Weight bearing as tolerated        06 /18/24 0729   04/09/23 0000  Change dressing       Comments: You may remove the bulky bandage  (ACE wrap and gauze) two days after surgery. You will have an adhesive waterproof bandage underneath. Leave this in place until your first follow-up appointment.   04/09/23 0729            Diagnostic Studies: No results found.  Disposition: Discharge disposition: 01-Home or Self Care       Discharge Instructions     Call MD / Call 911   Complete by:  As directed    If you experience chest pain or shortness of breath, CALL 911 and be transported to the hospital emergency room.  If you develope a fever above 101 F, pus (white drainage) or increased drainage or redness at the wound, or calf pain, call your surgeon's office.   Change dressing   Complete by: As directed    You may remove the bulky bandage (ACE wrap and gauze) two days after surgery. You will have an adhesive waterproof bandage underneath. Leave this in place until your first follow-up appointment.   Constipation Prevention   Complete by: As directed    Drink plenty of fluids.  Prune juice may be helpful.  You may use a stool softener, such as Colace (over the counter) 100 mg twice a day.  Use MiraLax (over the counter) for constipation as needed.   Diet - low sodium heart healthy   Complete by: As directed    Do not put a pillow under the knee. Place it under the heel.   Complete by: As directed    Driving restrictions   Complete by: As directed    No driving for two weeks   Post-operative opioid taper instructions:   Complete by: As directed    POST-OPERATIVE OPIOID TAPER INSTRUCTIONS: It is important to wean off of your opioid medication as soon as possible. If you do not need pain medication after your surgery it is ok to stop day one. Opioids include: Codeine, Hydrocodone(Norco, Vicodin), Oxycodone(Percocet, oxycontin) and hydromorphone amongst others.  Long term and even short term use of opiods can cause: Increased pain response Dependence Constipation Depression Respiratory depression And more.   Withdrawal symptoms can include Flu like symptoms Nausea, vomiting And more Techniques to manage these symptoms Hydrate well Eat regular healthy meals Stay active Use relaxation techniques(deep breathing, meditating, yoga) Do Not substitute Alcohol to help with tapering If you have been on opioids for less than two weeks and do not have pain than it is ok to stop all together.  Plan to wean off of opioids This plan should start within one week post op of your joint replacement. Maintain the same interval or time between taking each dose and first decrease the dose.  Cut the total daily intake of opioids by one tablet each day Next start to increase the time between doses. The last dose that should be eliminated is the evening dose.      TED hose   Complete by: As directed    Use stockings (TED hose) for three weeks on both leg(s).  You may remove them at night for sleeping.   Weight bearing as tolerated   Complete by: As directed         Follow-up Information     Aluisio, Homero Fellers, MD Follow up in 2 week(s).   Specialty: Orthopedic Surgery Contact information: [address] [place] [address] [phone]                  Signed: Arther Abbott 04/10/2023, 8:11 AM

## 2023-04-12 ENCOUNTER — Encounter (HOSPITAL_BASED_OUTPATIENT_CLINIC_OR_DEPARTMENT_OTHER): Payer: Self-pay | Admitting: Physical Therapy

## 2023-04-12 ENCOUNTER — Ambulatory Visit (HOSPITAL_BASED_OUTPATIENT_CLINIC_OR_DEPARTMENT_OTHER): Payer: Medicare Other | Admitting: Physical Therapy

## 2023-04-12 ENCOUNTER — Encounter (HOSPITAL_BASED_OUTPATIENT_CLINIC_OR_DEPARTMENT_OTHER): Payer: Self-pay

## 2023-04-16 ENCOUNTER — Encounter (HOSPITAL_BASED_OUTPATIENT_CLINIC_OR_DEPARTMENT_OTHER): Payer: Self-pay | Admitting: Physical Therapy

## 2023-04-17 ENCOUNTER — Ambulatory Visit (HOSPITAL_BASED_OUTPATIENT_CLINIC_OR_DEPARTMENT_OTHER): Payer: Medicare Other | Attending: Family Medicine | Admitting: Physical Therapy

## 2023-04-17 ENCOUNTER — Other Ambulatory Visit: Payer: Self-pay

## 2023-04-17 ENCOUNTER — Encounter (HOSPITAL_BASED_OUTPATIENT_CLINIC_OR_DEPARTMENT_OTHER): Payer: Self-pay | Admitting: Physical Therapy

## 2023-04-17 DIAGNOSIS — R600 Localized edema: Secondary | ICD-10-CM | POA: Diagnosis present

## 2023-04-17 DIAGNOSIS — R2689 Other abnormalities of gait and mobility: Secondary | ICD-10-CM | POA: Insufficient documentation

## 2023-04-17 DIAGNOSIS — M25561 Pain in right knee: Secondary | ICD-10-CM | POA: Diagnosis present

## 2023-04-17 DIAGNOSIS — M25661 Stiffness of right knee, not elsewhere classified: Secondary | ICD-10-CM | POA: Insufficient documentation

## 2023-04-17 NOTE — Therapy (Signed)
OUTPATIENT PHYSICAL THERAPY LOWER EXTREMITY EVALUATION   Patient Name: [name] MRN: [id number] DOB:1946-02-20, 77 y.o., female Today's Date: 04/18/2023  END OF SESSION:  PT End of Session - 04/17/23 1159     Visit Number 1    Number of Visits 16    Date for PT Re-Evaluation 06/12/23    PT Start Time 1145    PT Stop Time 1230    PT Time Calculation (min) 45 min    Activity Tolerance Patient tolerated treatment well    Behavior During Therapy WFL for tasks assessed/performed             Past Medical History:  Diagnosis Date   Allergy    Anxiety    Atherosclerosis    Cancer (HCC)    Basal cell skin cancer   Colon polyp    Compression fracture of L1 lumbar vertebra (HCC)    dx by ortho   Environmental allergies    Hypothyroid    Osteoarthritis    Osteoarthritis    Osteoarthritis    Osteopenia    Plantar fasciitis, right    Raynauds phenomenon    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   Sjogren's syndrome (HCC)    Past Surgical History:  Procedure Laterality Date   APPENDECTOMY     BUNIONECTOMY  2008   right foot   CATARACT EXTRACTION     COLONOSCOPY  12/05/2016   Dr.Jacobs   COLONOSCOPY W/ POLYPECTOMY  08/27/2011   ENDOMETRIAL ABLATION     FOOT SURGERY Right 12/17/2017   bone spur   ingrown toenail Right 12/16/2018   LAPAROSCOPIC APPENDECTOMY  10/11/2011   Procedure: APPENDECTOMY LAPAROSCOPIC;  Surgeon: Ardeth Sportsman, MD;  Location: [facility];  Service: General;  Laterality: N/A;  Partial Cecectomy and Appendectomy   POLYPECTOMY     TOTAL KNEE ARTHROPLASTY Right 04/08/2023   Procedure: TOTAL KNEE ARTHROPLASTY;  Surgeon: Ollen Gross, MD;  Location: [facility];  Service: Orthopedics;  Laterality: Right;   WISDOM TOOTH EXTRACTION     Patient Active Problem List   Diagnosis Date Noted   Primary osteoarthritis of right knee 04/08/2023   Encounter for counseling 09/09/2018   Osteopenia of multiple sites 08/22/2017   History of vitamin D  deficiency 08/22/2017   Autoimmune disease (HCC) 10/23/2016   Sjogren's syndrome (HCC) 10/09/2016   Primary osteoarthritis of both hands 10/09/2016   Primary osteoarthritis of both knees 10/09/2016   High risk medication use 09/12/2016   SBO (small bowel obstruction), partial postop 10/16/2011   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 On 6/17 she had a total knee replacement. Her therapy was delayed by complications with the pain medication. Her pain is well controlled now.   PERTINENT HISTORY: Osteopenia, L1 compression fx, OA of the knees and lumbar spine, PF and bone sur in the right foot with bone spur removal, raynaud, lupus, Sjogrens  PAIN:  Are you having pain? Yes: NPRS scale: 4/10 Pain location: medial and central knee  Pain description: aching  Aggravating factors: standing and walking  Relieving factors: rest   PRECAUTIONS: None  WEIGHT BEARING RESTRICTIONS: Yes WBAT   FALLS:  Has patient fallen in last 6 months? No  LIVING ENVIRONMENT: 3 steps into the kitchen  OCCUPATION:  Retired   PLOF: Independent  PATIENT GOALS:  To improve mobility   NEXT MD VISIT:  July 3rd   OBJECTIVE:   DIAGNOSTIC FINDINGS:  Nothing post op   PATIENT SURVEYS:  FOTO    COGNITION: Overall cognitive status: Within functional limits for tasks assessed     SENSATION: Occasionally leg feels like it is going to sleep    EDEMA:  Circumferential:  right: 46.5 left 36.5  MUSCLE LENGTH:  POSTURE: No Significant postural limitations  PALPATION: No unexpected tenderness to palpation   LOWER EXTREMITY ROM:  Passive ROM Right eval Left eval  Hip flexion    Hip extension    Hip abduction    Hip adduction    Hip internal rotation    Hip external rotation    Knee flexion    Knee extension    Ankle dorsiflexion    Ankle plantarflexion    Ankle inversion    Ankle eversion     (Blank rows = not tested)  LOWER EXTREMITY MMT:  MMT Right eval Left eval  Hip flexion    Hip extension    Hip abduction    Hip adduction    Hip internal rotation    Hip external rotation    Knee flexion 84   Knee extension -6   Ankle dorsiflexion    Ankle plantarflexion    Ankle inversion    Ankle eversion     (Blank rows = not tested)    GAIT: Limited weight bearing on right; forward trunk flexion onto the walker   TODAY'S TREATMENT:                                                                                                                              DATE:   Access Code: [email or web address] URL: [email or web address] Date: 04/17/2023 Prepared by: Lorayne Bender  Exercises - Supine Heel Slide with Strap  - 3 x daily - 7 x weekly - 1 sets - 5-10 reps - 5sec  hold - Supine Knee Extension Stretch on Towel Roll  - 3 x daily - 7 x weekly - 3 sets - 5-10 reps - 5 sec  hold - Supine Quad Set  - 3 x daily - 7 x weekly - 3 sets - 10 reps - 5 sec  hold - Small Range Straight Leg Raise  - 2 x daily - 7 x weekly - 3 sets - 5-10 reps  PATIENT EDUCATION:  Education details: HEP, symptom management  Person educated: Patient Education method: Explanation, Demonstration, Tactile cues, Verbal cues, and Handouts Education comprehension: verbalized understanding, returned demonstration, verbal cues required, tactile cues required, and needs further education  HOME EXERCISE PROGRAM: Access Code:  [email or web address] URL:  [email or web address] Date: 04/17/2023 Prepared by: Lorayne Bender  Exercises - Supine Heel Slide with Strap  - 3 x daily - 7 x weekly - 1 sets - 5-10 reps - 5sec  hold - Supine Knee Extension Stretch on Towel Roll  - 3 x daily - 7 x weekly - 3 sets - 5-10 reps - 5 sec  hold - Supine Quad Set  - 3 x daily - 7 x weekly - 3 sets - 10 reps - 5 sec  hold - Small Range Straight Leg Raise  - 2 x daily - 7 x weekly - 3 sets - 5-10 reps  ASSESSMENT:  CLINICAL IMPRESSION: Patient is a 77 year old female status post right TKA on 04/08/2023.  At this time her pain is well-controlled.  She presents with expected limitations in motion, strength, and functional mobility.  She is using a walker for primary mobility.  She would benefit from skilled therapy to improve her ability to ambulate in the community and to return to gardening and other activities she likes to do.  OBJECTIVE IMPAIRMENTS: Abnormal gait, decreased activity tolerance, decreased knowledge of condition, decreased knowledge of use of DME, decreased mobility, difficulty walking, decreased ROM, decreased strength, and pain.   ACTIVITY LIMITATIONS: carrying, lifting, bending, sitting, standing, squatting, sleeping, stairs, transfers, bed mobility, bathing, dressing, and locomotion level  PARTICIPATION LIMITATIONS: meal prep, cleaning, laundry, driving, shopping, community activity, and yard work  PERSONAL FACTORS: 1-2 comorbidities: osteoperosis, lupus  are also affecting patient's functional outcome.   REHAB POTENTIAL: Excellent  CLINICAL DECISION MAKING: Stable/uncomplicated  EVALUATION COMPLEXITY: Low   GOALS: Goals reviewed with patient? Yes  SHORT TERM GOALS: Target date: 05/15/2023   Patient will increase passive right knee flexion to 110 degrees  Baseline: Goal status: INITIAL  2.  Patient will increase right knee extension to 0  Baseline:  Goal status: INITIAL  3.  Patient will increase gross bilateral LE  strength by 5 pounds  Baseline:  Goal status: INITIAL   LONG TERM GOALS: Target date: 06/12/2023    Patient will return to complete gym and aquatic strengthening program  Baseline:  Goal status: INITIAL  2.  Patient will go up and down 8 steps with reciprocal pattern  Baseline:  Goal status: INITIAL  3.  Patient will ambulate 1000' without increased pain in order to go to the store  Baseline:  Goal status: INITIAL    PLAN:  PT FREQUENCY: 2x/week  PT DURATION: 8 weeks  PLANNED INTERVENTIONS: Therapeutic exercises, Therapeutic activity, Neuromuscular re-education, Balance training, Gait training, Patient/Family education, Self Care, Joint mobilization, Stair training, Aquatic Therapy, Dry Needling, Cryotherapy, Moist heat, Taping, and Ultrasound  PLAN FOR NEXT SESSION: PROM into flexion and extension; gait training when able; progress standing as tolerated. SLR, quad sets SAQ    Dessie Coma, PT 04/18/2023, 11:51 AM

## 2023-04-19 ENCOUNTER — Encounter (HOSPITAL_BASED_OUTPATIENT_CLINIC_OR_DEPARTMENT_OTHER): Payer: Self-pay | Admitting: Physical Therapy

## 2023-04-19 ENCOUNTER — Ambulatory Visit (HOSPITAL_BASED_OUTPATIENT_CLINIC_OR_DEPARTMENT_OTHER): Payer: Medicare Other | Admitting: Physical Therapy

## 2023-04-19 DIAGNOSIS — M25561 Pain in right knee: Secondary | ICD-10-CM

## 2023-04-19 DIAGNOSIS — M25661 Stiffness of right knee, not elsewhere classified: Secondary | ICD-10-CM

## 2023-04-19 DIAGNOSIS — R600 Localized edema: Secondary | ICD-10-CM

## 2023-04-19 DIAGNOSIS — R2689 Other abnormalities of gait and mobility: Secondary | ICD-10-CM

## 2023-04-19 NOTE — Therapy (Signed)
OUTPATIENT PHYSICAL THERAPY LOWER EXTREMITY EVALUATION   Patient Name: [name] MRN: [id number] DOB:June 19, 1946, 77 y.o., female Today's Date: 04/19/2023  END OF SESSION:  PT End of Session - 04/19/23 1210     Visit Number 2    Number of Visits 16    Date for PT Re-Evaluation 06/12/23    PT Start Time 1015    PT Stop Time 1058    PT Time Calculation (min) 43 min    Activity Tolerance Patient tolerated treatment well    Behavior During Therapy WFL for tasks assessed/performed              Past Medical History:  Diagnosis Date   Allergy    Anxiety    Atherosclerosis    Cancer (HCC)    Basal cell skin cancer   Colon polyp    Compression fracture of L1 lumbar vertebra (HCC)    dx by ortho   Environmental allergies    Hypothyroid    Osteoarthritis    Osteoarthritis    Osteoarthritis    Osteopenia    Plantar fasciitis, right    Raynauds phenomenon    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   Sjogren's syndrome (HCC)    Past Surgical History:  Procedure Laterality Date   APPENDECTOMY     BUNIONECTOMY  2008   right foot   CATARACT EXTRACTION     COLONOSCOPY  12/05/2016   Dr.Jacobs   COLONOSCOPY W/ POLYPECTOMY  08/27/2011   ENDOMETRIAL ABLATION     FOOT SURGERY Right 12/17/2017   bone spur   ingrown toenail Right 12/16/2018   LAPAROSCOPIC APPENDECTOMY  10/11/2011   Procedure: APPENDECTOMY LAPAROSCOPIC;  Surgeon: Ardeth Sportsman, MD;  Location: [facility];  Service: General;  Laterality: N/A;  Partial Cecectomy and Appendectomy   POLYPECTOMY     TOTAL KNEE ARTHROPLASTY Right 04/08/2023   Procedure: TOTAL KNEE ARTHROPLASTY;  Surgeon: Ollen Gross, MD;  Location: [facility];  Service: Orthopedics;  Laterality: Right;   WISDOM TOOTH EXTRACTION     Patient Active Problem List   Diagnosis Date Noted   Primary osteoarthritis of right knee 04/08/2023   Encounter for counseling 09/09/2018   Osteopenia of multiple sites 08/22/2017   History of vitamin D  deficiency 08/22/2017   Autoimmune disease (HCC) 10/23/2016   Sjogren's syndrome (HCC) 10/09/2016   Primary osteoarthritis of both hands 10/09/2016   Primary osteoarthritis of both knees 10/09/2016   High risk medication use 09/12/2016   SBO (small bowel obstruction), partial postop 10/16/2011   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  Rationale for Evaluation and Treatment: Rehabilitation  ONSET DATE: 04/08/2023  SUBJECTIVE:   SUBJECTIVE STATEMENT: Patient has no major complaints. She reports it is just a little sore.  PERTINENT HISTORY: Osteopenia, L1 compression fx, OA of the knees and lumbar spine, PF and bone sur in the right foot with bone spur removal, raynaud, lupus, Sjogrens  PAIN:  Are you having pain? Yes: NPRS scale: 4/10 Pain location: medial and central knee Pain description: aching  Aggravating factors: standing and walking  Relieving factors: rest   PRECAUTIONS: None  WEIGHT BEARING RESTRICTIONS: Yes WBAT   FALLS:  Has patient fallen in last 6 months? No  LIVING ENVIRONMENT: 3 steps  into the kitchen  OCCUPATION:  Retired   PLOF: Independent  PATIENT GOALS:  To improve mobility   NEXT MD VISIT:  July 3rd   OBJECTIVE:   DIAGNOSTIC FINDINGS:  Nothing post op   PATIENT SURVEYS:  FOTO    COGNITION: Overall cognitive status: Within functional limits for tasks assessed     SENSATION: Occasionally leg feels like it is going to sleep    EDEMA:  Circumferential: right: 46.5 left 36.5  MUSCLE LENGTH:  POSTURE: No Significant postural limitations  PALPATION: No unexpected tenderness to palpation   LOWER EXTREMITY ROM:  Passive ROM Right eval Left eval  Hip flexion    Hip extension    Hip  abduction    Hip adduction    Hip internal rotation    Hip external rotation    Knee flexion    Knee extension    Ankle dorsiflexion    Ankle plantarflexion    Ankle inversion    Ankle eversion     (Blank rows = not tested)  LOWER EXTREMITY MMT:  MMT Right eval Left eval  Hip flexion    Hip extension    Hip abduction    Hip adduction    Hip internal rotation    Hip external rotation    Knee flexion 84 93  Knee extension -6 -3  Ankle dorsiflexion    Ankle plantarflexion    Ankle inversion    Ankle eversion     (Blank rows = not tested)    GAIT: Limited weight bearing on right; forward trunk flexion onto the walker   TODAY'S TREATMENT:                                                                                                                              DATE:   6/28 PROM into flexion and extension  SAQ 3x10  SLR 2x10  Quad set 2x15   Standing heel raise 2x15  Standing left  leg march for left weight bearing 2x10 with UE assist.     Access Code: [email or web address] URL: [email or web address] Date: 04/17/2023 Prepared by: Lorayne Bender  Exercises - Supine Heel Slide with Strap  - 3 x daily - 7 x weekly - 1 sets - 5-10 reps - 5sec  hold - Supine Knee Extension Stretch on Towel Roll  - 3 x daily - 7 x weekly - 3 sets - 5-10 reps - 5 sec  hold - Supine Quad Set  - 3 x daily - 7 x weekly - 3 sets - 10 reps - 5 sec  hold - Small Range Straight Leg Raise  - 2 x daily - 7 x weekly - 3 sets - 5-10 reps  PATIENT EDUCATION:  Education details: HEP, symptom management  Person educated: Patient Education method: Explanation, Demonstration, Tactile cues, Verbal cues, and Handouts Education comprehension: verbalized understanding, returned demonstration, verbal cues required, tactile cues required, and needs further education  HOME EXERCISE PROGRAM: Access Code:  [email or web address] URL: [email or web address] Date: 04/17/2023 Prepared by: Lorayne Bender  Exercises - Supine Heel Slide with Strap  - 3 x daily - 7 x weekly - 1 sets - 5-10 reps - 5sec  hold - Supine Knee Extension Stretch on Towel Roll  - 3 x daily - 7 x weekly - 3 sets - 5-10 reps - 5 sec  hold - Supine Quad Set  - 3 x daily - 7 x weekly - 3 sets - 10 reps - 5 sec  hold - Small Range Straight Leg Raise  - 2 x daily - 7 x weekly - 3 sets - 5-10 reps  ASSESSMENT:  CLINICAL IMPRESSION: Patient is doing very well. Her range is progressing. She tolerated there-ex well. She had a good controlled SLR. She can liley begin cane training next visit as long as she feels stable. She was given an updated HEP. We added standing heel raise as well as opposite leg march for weight bearing.       Eval: Patient is a 77 year old female status post right TKA on 04/08/2023.  At this time her pain is well-controlled.  She presents with expected limitations in motion, strength, and functional mobility.  She is using a walker for primary mobility.  She would benefit from skilled therapy to improve her ability to ambulate in the community and to return to gardening and other activities she likes to do.  OBJECTIVE IMPAIRMENTS: Abnormal gait, decreased activity tolerance, decreased knowledge of condition, decreased knowledge of use of DME, decreased mobility, difficulty walking, decreased ROM, decreased strength, and pain.   ACTIVITY LIMITATIONS: carrying, lifting, bending, sitting, standing, squatting, sleeping, stairs, transfers, bed mobility, bathing, dressing, and locomotion level  PARTICIPATION LIMITATIONS: meal prep, cleaning, laundry, driving, shopping, community activity, and yard work  PERSONAL FACTORS: 1-2 comorbidities: osteoperosis, lupus  are also affecting patient's functional outcome.   REHAB POTENTIAL: Excellent  CLINICAL DECISION MAKING: Stable/uncomplicated  EVALUATION COMPLEXITY: Low   GOALS: Goals reviewed with patient? Yes  SHORT TERM GOALS: Target date:  05/15/2023   Patient will increase passive right knee flexion to 110 degrees  Baseline: Goal status: INITIAL  2.  Patient will increase right knee extension to 0  Baseline:  Goal status: INITIAL  3.  Patient will increase gross bilateral LE strength by 5 pounds  Baseline:  Goal status: INITIAL   LONG TERM GOALS: Target date: 06/12/2023    Patient will return to complete gym and aquatic strengthening program  Baseline:  Goal status: INITIAL  2.  Patient will go up and down 8 steps with reciprocal pattern  Baseline:  Goal status: INITIAL  3.  Patient will ambulate 1000' without increased pain in order to go to the store  Baseline:  Goal status: INITIAL    PLAN:  PT FREQUENCY: 2x/week  PT DURATION: 8 weeks  PLANNED INTERVENTIONS: Therapeutic exercises, Therapeutic activity, Neuromuscular re-education, Balance training, Gait training, Patient/Family education, Self Care, Joint mobilization, Stair training, Aquatic Therapy, Dry Needling, Cryotherapy, Moist heat, Taping, and Ultrasound  PLAN FOR NEXT SESSION: PROM into flexion and extension; gait training when able; progress standing as tolerated. SLR, quad sets SAQ    Dessie Coma, PT 04/19/2023, 12:11 PM

## 2023-04-20 ENCOUNTER — Other Ambulatory Visit: Payer: Self-pay | Admitting: Physician Assistant

## 2023-04-20 DIAGNOSIS — M359 Systemic involvement of connective tissue, unspecified: Secondary | ICD-10-CM

## 2023-04-20 DIAGNOSIS — M3501 Sicca syndrome with keratoconjunctivitis: Secondary | ICD-10-CM

## 2023-04-20 DIAGNOSIS — Z79899 Other long term (current) drug therapy: Secondary | ICD-10-CM

## 2023-04-22 ENCOUNTER — Encounter (HOSPITAL_BASED_OUTPATIENT_CLINIC_OR_DEPARTMENT_OTHER): Payer: Self-pay

## 2023-04-22 ENCOUNTER — Ambulatory Visit (HOSPITAL_BASED_OUTPATIENT_CLINIC_OR_DEPARTMENT_OTHER): Payer: Medicare Other | Attending: Family Medicine

## 2023-04-22 DIAGNOSIS — M25561 Pain in right knee: Secondary | ICD-10-CM | POA: Insufficient documentation

## 2023-04-22 DIAGNOSIS — R2689 Other abnormalities of gait and mobility: Secondary | ICD-10-CM | POA: Insufficient documentation

## 2023-04-22 DIAGNOSIS — G8929 Other chronic pain: Secondary | ICD-10-CM | POA: Diagnosis present

## 2023-04-22 DIAGNOSIS — M25661 Stiffness of right knee, not elsewhere classified: Secondary | ICD-10-CM | POA: Diagnosis present

## 2023-04-22 DIAGNOSIS — R600 Localized edema: Secondary | ICD-10-CM | POA: Insufficient documentation

## 2023-04-22 NOTE — Telephone Encounter (Signed)
Last Fill: 11/19/2022  Eye exam: 10/29/2022 Normal.   Labs: 04/09/2023 WBC 13.7, RBC 3.40, Hgb 11.2, Hct 34.0, Glucose 135, Calcium 8.1  Next Visit: 07/16/2023  Last Visit: 01/22/2023  WU:[id number] syndrome with keratoconjunctivitis   Current Dose per office note 01/22/2023: Plaquenil 200 mg 1 tablet by mouth daily   Okay to refill Plaquenil?

## 2023-04-22 NOTE — Therapy (Signed)
OUTPATIENT PHYSICAL THERAPY LOWER EXTREMITY TREATMENT   Patient Name: [name] MRN: [id number] DOB:1946/02/07, 77 y.o., female Today's Date: 04/22/2023  END OF SESSION:  PT End of Session - 04/22/23 1121     Visit Number 3    Number of Visits 16    Date for PT Re-Evaluation 06/12/23    PT Start Time 1055    PT Stop Time 1143    PT Time Calculation (min) 48 min    Activity Tolerance Patient tolerated treatment well    Behavior During Therapy WFL for tasks assessed/performed               Past Medical History:  Diagnosis Date   Allergy    Anxiety    Atherosclerosis    Cancer (HCC)    Basal cell skin cancer   Colon polyp    Compression fracture of L1 lumbar vertebra (HCC)    dx by ortho   Environmental allergies    Hypothyroid    Osteoarthritis    Osteoarthritis    Osteoarthritis    Osteopenia    Plantar fasciitis, right    Raynauds phenomenon    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   Sjogren's syndrome (HCC)    Past Surgical History:  Procedure Laterality Date   APPENDECTOMY     BUNIONECTOMY  2008   right foot   CATARACT EXTRACTION     COLONOSCOPY  12/05/2016   Dr.Jacobs   COLONOSCOPY W/ POLYPECTOMY  08/27/2011   ENDOMETRIAL ABLATION     FOOT SURGERY Right 12/17/2017   bone spur   ingrown toenail Right 12/16/2018   LAPAROSCOPIC APPENDECTOMY  10/11/2011   Procedure: APPENDECTOMY LAPAROSCOPIC;  Surgeon: Ardeth Sportsman, MD;  Location: [facility];  Service: General;  Laterality: N/A;  Partial Cecectomy and Appendectomy   POLYPECTOMY     TOTAL KNEE ARTHROPLASTY Right 04/08/2023   Procedure: TOTAL KNEE ARTHROPLASTY;  Surgeon: Ollen Gross, MD;  Location: [facility];  Service: Orthopedics;  Laterality: Right;   WISDOM TOOTH EXTRACTION     Patient Active Problem List   Diagnosis Date Noted   Primary osteoarthritis of right knee 04/08/2023   Encounter for counseling 09/09/2018   Osteopenia of multiple sites 08/22/2017   History of vitamin D  deficiency 08/22/2017   Autoimmune disease (HCC) 10/23/2016   Sjogren's syndrome (HCC) 10/09/2016   Primary osteoarthritis of both hands 10/09/2016   Primary osteoarthritis of both knees 10/09/2016   High risk medication use 09/12/2016   SBO (small bowel obstruction), partial postop 10/16/2011   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 Arrives with FWW. Denies significant pain.  PERTINENT HISTORY: Osteopenia, L1 compression fx, OA of the knees and lumbar spine, PF and bone sur in the right foot with bone spur removal, raynaud, lupus, Sjogrens  PAIN:  Are you having pain? Yes: NPRS scale: 4/10 Pain location: medial and central knee Pain description: aching  Aggravating factors: standing and walking  Relieving factors: rest   PRECAUTIONS:  None  WEIGHT BEARING RESTRICTIONS: Yes WBAT   FALLS:  Has patient fallen in last 6 months? No  LIVING ENVIRONMENT: 3 steps into the kitchen  OCCUPATION:  Retired   PLOF: Independent  PATIENT GOALS:  To improve mobility   NEXT MD VISIT:  July 3rd   OBJECTIVE:   DIAGNOSTIC FINDINGS:  Nothing post op   PATIENT SURVEYS:  FOTO    COGNITION: Overall cognitive status: Within functional limits for tasks assessed     SENSATION: Occasionally leg feels like it is going to sleep    EDEMA:  Circumferential: right: 46.5 left 36.5  MUSCLE LENGTH:  POSTURE: No Significant postural  limitations  PALPATION: No unexpected tenderness to palpation   LOWER EXTREMITY ROM:  Passive ROM Right eval Left eval Right 7/1  Hip flexion     Hip extension     Hip abduction     Hip adduction     Hip internal rotation     Hip external rotation     Knee flexion   96deg AAROM  Knee extension     Ankle dorsiflexion     Ankle plantarflexion     Ankle inversion     Ankle eversion      (Blank rows = not tested)  LOWER EXTREMITY MMT:  MMT Right eval Left eval  Hip flexion    Hip extension    Hip abduction    Hip adduction    Hip internal rotation    Hip external rotation    Knee flexion 84 93  Knee extension -6 -3  Ankle dorsiflexion    Ankle plantarflexion    Ankle inversion    Ankle eversion     (Blank rows = not tested)    GAIT: Limited weight bearing on right; forward trunk flexion onto the walker   TODAY'S TREATMENT:                                                                                                                              DATE:    7/1 -PROM R knee flexion and extension -SAQ- 5sec hold 2x10 -SLR 2x10 -LAQ-5sec hold 2x10 -Quad set 5" 2x10 -Heel slide with strap- 2x10- 5sec hold -Heel prop -Gait training with SPC full hall x2     6/28 PROM into flexion and extension  SAQ 3x10  SLR 2x10  Quad set 2x15   Standing heel raise 2x15  Standing left  leg march for left weight bearing 2x10 with UE assist.     Access Code: [email or web address] URL: [email or web address] Date: 04/17/2023 Prepared by: Lorayne Bender  Exercises - Supine Heel Slide with Strap  - 3 x daily - 7 x weekly - 1 sets - 5-10 reps - 5sec  hold - Supine Knee Extension Stretch on Towel Roll  - 3 x daily - 7 x weekly - 3 sets - 5-10 reps - 5 sec  hold - Supine Quad Set  - 3 x daily -  7 x weekly - 3 sets - 10 reps - 5 sec  hold - Small Range Straight Leg Raise  - 2 x daily - 7 x weekly - 3 sets - 5-10 reps  PATIENT EDUCATION:  Education details: HEP,  symptom management  Person educated: Patient Education method: Explanation, Demonstration, Tactile cues, Verbal cues, and Handouts Education comprehension: verbalized understanding, returned demonstration, verbal cues required, tactile cues required, and needs further education  HOME EXERCISE PROGRAM: Access Code: [email or web address] URL: [email or web address] Date: 04/17/2023 Prepared by: Lorayne Bender  Exercises - Supine Heel Slide with Strap  - 3 x daily - 7 x weekly - 1 sets - 5-10 reps - 5sec  hold - Supine Knee Extension Stretch on Towel Roll  - 3 x daily - 7 x weekly - 3 sets - 5-10 reps - 5 sec  hold - Supine Quad Set  - 3 x daily - 7 x weekly - 3 sets - 10 reps - 5 sec  hold - Small Range Straight Leg Raise  - 2 x daily - 7 x weekly - 3 sets - 5-10 reps  ASSESSMENT:  CLINICAL IMPRESSION: Measured at 96 degrees AA knee flexion today. She has mild extension deficit which improved following heel prop and PROM. She was instructed in how to complete heel prop exercise at home. Worked on gait training with SPC today as pt demonstrates excellent quad control. She requires SBA with this and uses step to pattern. Pt has significant fear of falling. Pt to practice with cane n hallway at home, but to use FWW for now at other times.       Eval: Patient is a 77 year old female status post right TKA on 04/08/2023.  At this time her pain is well-controlled.  She presents with expected limitations in motion, strength, and functional mobility.  She is using a walker for primary mobility.  She would benefit from skilled therapy to improve her ability to ambulate in the community and to return to gardening and other activities she likes to do.  OBJECTIVE IMPAIRMENTS: Abnormal gait, decreased activity tolerance, decreased knowledge of condition, decreased knowledge of use of DME, decreased mobility, difficulty walking, decreased ROM, decreased strength, and pain.   ACTIVITY LIMITATIONS:  carrying, lifting, bending, sitting, standing, squatting, sleeping, stairs, transfers, bed mobility, bathing, dressing, and locomotion level  PARTICIPATION LIMITATIONS: meal prep, cleaning, laundry, driving, shopping, community activity, and yard work  PERSONAL FACTORS: 1-2 comorbidities: osteoperosis, lupus  are also affecting patient's functional outcome.   REHAB POTENTIAL: Excellent  CLINICAL DECISION MAKING: Stable/uncomplicated  EVALUATION COMPLEXITY: Low   GOALS: Goals reviewed with patient? Yes  SHORT TERM GOALS: Target date: 05/15/2023   Patient will increase passive right knee flexion to 110 degrees  Baseline: Goal status: INITIAL  2.  Patient will increase right knee extension to 0  Baseline:  Goal status: INITIAL  3.  Patient will increase gross bilateral LE strength by 5 pounds  Baseline:  Goal status: INITIAL   LONG TERM GOALS: Target date: 06/12/2023    Patient will return to complete gym and aquatic strengthening program  Baseline:  Goal status: INITIAL  2.  Patient will go up and down 8 steps with reciprocal pattern  Baseline:  Goal status: INITIAL  3.  Patient will ambulate 1000' without increased pain in order to go to the store  Baseline:  Goal status: INITIAL    PLAN:  PT FREQUENCY: 2x/week  PT DURATION: 8 weeks  PLANNED INTERVENTIONS: Therapeutic exercises, Therapeutic activity, Neuromuscular re-education,  Balance training, Gait training, Patient/Family education, Self Care, Joint mobilization, Stair training, Aquatic Therapy, Dry Needling, Cryotherapy, Moist heat, Taping, and Ultrasound  PLAN FOR NEXT SESSION: PROM into flexion and extension; gait training when able; progress standing as tolerated. SLR, quad sets SAQ    Lenore Manner, PTA 04/22/2023, 11:51 AM

## 2023-04-23 ENCOUNTER — Encounter (HOSPITAL_BASED_OUTPATIENT_CLINIC_OR_DEPARTMENT_OTHER): Payer: Self-pay | Admitting: Physical Therapy

## 2023-04-26 ENCOUNTER — Encounter (HOSPITAL_BASED_OUTPATIENT_CLINIC_OR_DEPARTMENT_OTHER): Payer: Medicare Other

## 2023-04-27 ENCOUNTER — Encounter (HOSPITAL_BASED_OUTPATIENT_CLINIC_OR_DEPARTMENT_OTHER): Payer: Self-pay | Admitting: Rehabilitative and Restorative Service Providers"

## 2023-04-27 ENCOUNTER — Ambulatory Visit (HOSPITAL_BASED_OUTPATIENT_CLINIC_OR_DEPARTMENT_OTHER): Payer: Medicare Other | Admitting: Rehabilitative and Restorative Service Providers"

## 2023-04-27 DIAGNOSIS — R2689 Other abnormalities of gait and mobility: Secondary | ICD-10-CM

## 2023-04-27 DIAGNOSIS — R600 Localized edema: Secondary | ICD-10-CM

## 2023-04-27 DIAGNOSIS — M25661 Stiffness of right knee, not elsewhere classified: Secondary | ICD-10-CM

## 2023-04-27 DIAGNOSIS — G8929 Other chronic pain: Secondary | ICD-10-CM

## 2023-04-27 DIAGNOSIS — M25561 Pain in right knee: Secondary | ICD-10-CM | POA: Diagnosis not present

## 2023-04-27 NOTE — Therapy (Signed)
[facility] Outpatient Rehabilitation at [facility] [address] [place], [place], [address] Phone: [phone]   Fax:  [phone]  Patient Details  Name: [name] MRN: [id number] Date of Birth: 05-25-1946 OUTPATIENT PHYSICAL THERAPY LOWER EXTREMITY TREATMENT   Patient Name: [name] MRN: [id number] DOB:Sep 12, 1946, 77 y.o., female Today's Date: 04/27/2023  END OF SESSION:  PT End of Session - 04/27/23 0916     Visit Number 4    Number of Visits 16    Date for PT Re-Evaluation 06/12/23    Authorization Type progress note done on visit 9    PT Start Time 0910    PT Stop Time 1006    PT Time Calculation (min) 56 min    Activity Tolerance Patient tolerated treatment well;No increased pain    Behavior During Therapy WFL for tasks assessed/performed               Past Medical History:  Diagnosis Date   Allergy    Anxiety    Atherosclerosis    Cancer (HCC)    Basal cell skin cancer   Colon polyp    Compression fracture of L1 lumbar vertebra (HCC)    dx by ortho   Environmental allergies    Hypothyroid    Osteoarthritis    Osteoarthritis    Osteoarthritis    Osteopenia    Plantar fasciitis, right    Raynauds phenomenon    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   Sjogren's syndrome (HCC)    Past Surgical History:  Procedure Laterality Date   APPENDECTOMY     BUNIONECTOMY  2008   right foot   CATARACT EXTRACTION     COLONOSCOPY  12/05/2016   Dr.Jacobs   COLONOSCOPY W/ POLYPECTOMY  08/27/2011   ENDOMETRIAL ABLATION     FOOT SURGERY Right 12/17/2017   bone spur   ingrown toenail Right 12/16/2018   LAPAROSCOPIC APPENDECTOMY  10/11/2011   Procedure: APPENDECTOMY LAPAROSCOPIC;  Surgeon: Ardeth Sportsman, MD;  Location: [facility];  Service: General;  Laterality: N/A;  Partial Cecectomy and Appendectomy   POLYPECTOMY     TOTAL KNEE ARTHROPLASTY Right 04/08/2023   Procedure: TOTAL KNEE ARTHROPLASTY;  Surgeon:  Ollen Gross, MD;  Location: [facility];  Service: Orthopedics;  Laterality: Right;   WISDOM TOOTH EXTRACTION     Patient Active Problem List   Diagnosis Date Noted   Primary osteoarthritis of right knee 04/08/2023   Encounter for counseling 09/09/2018   Osteopenia of multiple sites 08/22/2017   History of vitamin D deficiency 08/22/2017   Autoimmune disease (HCC) 10/23/2016   Sjogren's syndrome (HCC) 10/09/2016   Primary osteoarthritis of both hands 10/09/2016   Primary osteoarthritis of both knees 10/09/2016   High risk medication use 09/12/2016   SBO (small bowel obstruction), partial postop 10/16/2011   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   BILIARY DYSKINESIA, 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% 07/20/2009    PCP: Selena Batten MD  REFERRING PROVIDER: Dr Loel Ro   REFERRING DIAG:   M17.11 (ICD-10-CM) - Unilateral primary osteoarthritis, right knee    THERAPY DIAG:  Acute pain of right knee  Localized edema  Other abnormalities of gait and mobility  Stiffness of right knee, not elsewhere classified  Chronic pain of right knee  Rationale for Evaluation and Treatment: Rehabilitation  ONSET DATE: 04/08/2023  SUBJECTIVE:   SUBJECTIVE STATEMENT: Patient reports more confidence with walking with cane PERTINENT HISTORY: Osteopenia, L1 compression fx, OA of the knees and lumbar spine, PF  and bone sur in the right foot with bone spur removal, raynaud, lupus, Sjogrens  PAIN:  Are you having pain? Yes: NPRS scale: 2/10 Pain location: medial and central knee Pain description: aching  Aggravating factors: standing and walking  Relieving factors: rest   PRECAUTIONS: None  WEIGHT BEARING RESTRICTIONS: Yes WBAT   FALLS:  Has patient fallen in last 6 months? No  LIVING ENVIRONMENT: 3 steps into the kitchen  OCCUPATION:  Retired   PLOF: Independent  PATIENT GOALS:  To improve mobility   NEXT MD VISIT:  July 3rd   OBJECTIVE:   DIAGNOSTIC FINDINGS:  Nothing post op    PATIENT SURVEYS:  FOTO    COGNITION: Overall cognitive status: Within functional limits for tasks assessed     SENSATION: Occasionally leg feels like it is going to sleep    EDEMA:  Circumferential: right: 46.5 left 36.5  MUSCLE LENGTH:  POSTURE: No Significant postural limitations  PALPATION: No unexpected tenderness to palpation   LOWER EXTREMITY ROM:  Passive ROM Right eval Left eval Right 7/1  Hip flexion     Hip extension     Hip abduction     Hip adduction     Hip internal rotation     Hip external rotation     Knee flexion   96deg AAROM  Knee extension     Ankle dorsiflexion     Ankle plantarflexion     Ankle inversion     Ankle eversion      (Blank rows = not tested)  LOWER EXTREMITY MMT:  MMT Right eval Left eval  Hip flexion    Hip extension    Hip abduction    Hip adduction    Hip internal rotation    Hip external rotation    Knee flexion 84 93  Knee extension -6 -3  Ankle dorsiflexion    Ankle plantarflexion    Ankle inversion    Ankle eversion     (Blank rows = not tested)    GAIT: Limited weight bearing on right; forward trunk flexion onto the walker   TODAY'S TREATMENT:                                                                                                                              DATE:    [facility] Adult PT Treatment:                                                DATE: 04/27/23 Therapeutic Exercise: Heel slides x 15 R with 2-3 sec holds Quad sets R x 15 with concentration on minimal compensation R SAQ x 20 with focus on quad set Iso R SAQ with ankle pump x 20 SLR R 2x10 without extension lag SLR with hip abdct/adduction 2x10 without extension lag Ball squeeze x 20  NuStep bil UE/LE level 3 x 6 min with seat at a comfortable setting for gentle knee flexion stretch Manual Therapy: R knee flexion overpressure after heel slide x 10 Grade 1-3 knee ext oscillations with sustained hold for knee extension Discussed with  pt during manual importance of icing at least 3x/day 15 min due to swelling present behind knee especially Gait Training: GT with cane with PT emphasis on = WB bil LEs, increasing arm swing R due to holding UE stiff by her side, and foot clearance in hallway. Pt did better with equalizing WB upon quickened cadence change   7/1 -PROM R knee flexion and extension -SAQ- 5sec hold 2x10 -SLR 2x10 -LAQ-5sec hold 2x10 -Quad set 5" 2x10 -Heel slide with strap- 2x10- 5sec hold -Heel prop -Gait training with SPC full hall x2     6/28 PROM into flexion and extension  SAQ 3x10  SLR 2x10  Quad set 2x15   Standing heel raise 2x15  Standing left  leg march for left weight bearing 2x10 with UE assist.     Access Code: [email or web address] URL: [email or web address] Date: 04/17/2023 Prepared by: Lorayne Bender  Exercises - Supine Heel Slide with Strap  - 3 x daily - 7 x weekly - 1 sets - 5-10 reps - 5sec  hold - Supine Knee Extension Stretch on Towel Roll  - 3 x daily - 7 x weekly - 3 sets - 5-10 reps - 5 sec  hold - Supine Quad Set  - 3 x daily - 7 x weekly - 3 sets - 10 reps - 5 sec  hold - Small Range Straight Leg Raise  - 2 x daily - 7 x weekly - 3 sets - 5-10 reps  PATIENT EDUCATION:  Education details: HEP, symptom management  Person educated: Patient Education method: Explanation, Demonstration, Tactile cues, Verbal cues, and Handouts Education comprehension: verbalized understanding, returned demonstration, verbal cues required, tactile cues required, and needs further education  HOME EXERCISE PROGRAM: Access Code: [email or web address] URL: [email or web address] Date: 04/17/2023 Prepared by: Lorayne Bender  Exercises - Supine Heel Slide with Strap  - 3 x daily - 7 x weekly - 1 sets - 5-10 reps - 5sec  hold - Supine Knee Extension Stretch on Towel Roll  - 3 x daily - 7 x weekly - 3 sets - 5-10 reps - 5 sec  hold - Supine Quad Set  - 3 x daily - 7 x weekly - 3 sets -  10 reps - 5 sec  hold - Small Range Straight Leg Raise  - 2 x daily - 7 x weekly - 3 sets - 5-10 reps  ASSESSMENT:  CLINICAL IMPRESSION: Pt went to MD 04/23/23 and he was pleased with progress measuring flexion at 100 degrees. Today R knee flexion measures 102 degrees and -9 degrees post tx. Pt was able to tolerate all therex and manual without increased pain. She reports compliance with HEP. Pt responds well to manual therapy with improvement in AROM post without increased pain. Continue R knee flexion/ext ROM activities and focus on R LE WB to assist with improved gait efficiency and symmetry.      Eval: Patient is a 77 year old female status post right TKA on 04/08/2023.  At this time her pain is well-controlled.  She presents with expected limitations in motion, strength, and functional mobility.  She is using a walker for primary mobility.  She would benefit from skilled therapy to improve her ability to ambulate in the community and to return  to gardening and other activities she likes to do.  OBJECTIVE IMPAIRMENTS: Abnormal gait, decreased activity tolerance, decreased knowledge of condition, decreased knowledge of use of DME, decreased mobility, difficulty walking, decreased ROM, decreased strength, and pain.   ACTIVITY LIMITATIONS: carrying, lifting, bending, sitting, standing, squatting, sleeping, stairs, transfers, bed mobility, bathing, dressing, and locomotion level  PARTICIPATION LIMITATIONS: meal prep, cleaning, laundry, driving, shopping, community activity, and yard work  PERSONAL FACTORS: 1-2 comorbidities: osteoperosis, lupus  are also affecting patient's functional outcome.   REHAB POTENTIAL: Excellent  CLINICAL DECISION MAKING: Stable/uncomplicated  EVALUATION COMPLEXITY: Low   GOALS: Goals reviewed with patient? Yes  SHORT TERM GOALS: Target date: 05/15/2023   Patient will increase passive right knee flexion to 110 degrees  Baseline: Goal status: INITIAL  2.   Patient will increase right knee extension to 0  Baseline:  Goal status: INITIAL  3.  Patient will increase gross bilateral LE strength by 5 pounds  Baseline:  Goal status: INITIAL   LONG TERM GOALS: Target date: 06/12/2023    Patient will return to complete gym and aquatic strengthening program  Baseline:  Goal status: INITIAL  2.  Patient will go up and down 8 steps with reciprocal pattern  Baseline:  Goal status: INITIAL  3.  Patient will ambulate 1000' without increased pain in order to go to the store  Baseline:  Goal status: INITIAL    PLAN:  PT FREQUENCY: 2x/week  PT DURATION: 8 weeks  PLANNED INTERVENTIONS: Therapeutic exercises, Therapeutic activity, Neuromuscular re-education, Balance training, Gait training, Patient/Family education, Self Care, Joint mobilization, Stair training, Aquatic Therapy, Dry Needling, Cryotherapy, Moist heat, Taping, and Ultrasound  PLAN FOR NEXT SESSION: Continue R knee ROM activities, progress to standing therex, did pt increase icing frequency       Luna Fuse, PT 04/27/2023, 10:12 AM  [facility] Outpatient Rehabilitation at [facility] [address] [place], [place], [address] Phone: [phone]   Fax:  [phone]

## 2023-04-29 ENCOUNTER — Encounter (HOSPITAL_BASED_OUTPATIENT_CLINIC_OR_DEPARTMENT_OTHER): Payer: Self-pay | Admitting: Physical Therapy

## 2023-04-29 ENCOUNTER — Ambulatory Visit (HOSPITAL_BASED_OUTPATIENT_CLINIC_OR_DEPARTMENT_OTHER): Payer: Medicare Other | Admitting: Physical Therapy

## 2023-04-29 DIAGNOSIS — M25561 Pain in right knee: Secondary | ICD-10-CM | POA: Diagnosis not present

## 2023-04-29 DIAGNOSIS — M25661 Stiffness of right knee, not elsewhere classified: Secondary | ICD-10-CM

## 2023-04-29 DIAGNOSIS — R600 Localized edema: Secondary | ICD-10-CM

## 2023-04-29 DIAGNOSIS — R2689 Other abnormalities of gait and mobility: Secondary | ICD-10-CM

## 2023-04-29 NOTE — Therapy (Signed)
[facility] Outpatient Rehabilitation at [facility] [address] [place], [place], [address] Phone: [phone]   Fax:  [phone]  Patient Details  Name: [name] MRN: [id number] Date of Birth: 07-20-1946 OUTPATIENT PHYSICAL THERAPY LOWER EXTREMITY TREATMENT   Patient Name: [name] MRN: [id number] DOB:04/10/1946, 77 y.o., female Today's Date: 04/29/2023  END OF SESSION:  PT End of Session - 04/29/23 1321     Visit Number 5    Number of Visits 16    Date for PT Re-Evaluation 06/12/23    Authorization Type progress note done on visit 9    PT Start Time 1318    PT Stop Time 1400    PT Time Calculation (min) 42 min    Activity Tolerance Patient tolerated treatment well;No increased pain    Behavior During Therapy WFL for tasks assessed/performed               Past Medical History:  Diagnosis Date   Allergy    Anxiety    Atherosclerosis    Cancer (HCC)    Basal cell skin cancer   Colon polyp    Compression fracture of L1 lumbar vertebra (HCC)    dx by ortho   Environmental allergies    Hypothyroid    Osteoarthritis    Osteoarthritis    Osteoarthritis    Osteopenia    Plantar fasciitis, right    Raynauds phenomenon    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   Sjogren's syndrome (HCC)    Past Surgical History:  Procedure Laterality Date   APPENDECTOMY     BUNIONECTOMY  2008   right foot   CATARACT EXTRACTION     COLONOSCOPY  12/05/2016   Dr.Jacobs   COLONOSCOPY W/ POLYPECTOMY  08/27/2011   ENDOMETRIAL ABLATION     FOOT SURGERY Right 12/17/2017   bone spur   ingrown toenail Right 12/16/2018   LAPAROSCOPIC APPENDECTOMY  10/11/2011   Procedure: APPENDECTOMY LAPAROSCOPIC;  Surgeon: Ardeth Sportsman, MD;  Location: [facility];  Service: General;  Laterality: N/A;  Partial Cecectomy and Appendectomy   POLYPECTOMY     TOTAL KNEE ARTHROPLASTY Right 04/08/2023   Procedure: TOTAL KNEE ARTHROPLASTY;  Surgeon:  Ollen Gross, MD;  Location: [facility];  Service: Orthopedics;  Laterality: Right;   WISDOM TOOTH EXTRACTION     Patient Active Problem List   Diagnosis Date Noted   Primary osteoarthritis of right knee 04/08/2023   Encounter for counseling 09/09/2018   Osteopenia of multiple sites 08/22/2017   History of vitamin D deficiency 08/22/2017   Autoimmune disease (HCC) 10/23/2016   Sjogren's syndrome (HCC) 10/09/2016   Primary osteoarthritis of both hands 10/09/2016   Primary osteoarthritis of both knees 10/09/2016   High risk medication use 09/12/2016   SBO (small bowel obstruction), partial postop 10/16/2011   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 She is having some swelling. She was advised that this is normal.   PERTINENT  HISTORY: Osteopenia, L1 compression fx, OA of the knees and lumbar spine, PF and bone sur in the right foot with bone spur removal, raynaud, lupus, Sjogrens  PAIN:  Are you having pain? Yes: NPRS scale: 2/10 Pain location: medial and central knee Pain description: aching  Aggravating factors: standing and walking  Relieving factors: rest   PRECAUTIONS: None  WEIGHT BEARING RESTRICTIONS: Yes WBAT   FALLS:  Has patient fallen in last 6 months? No  LIVING ENVIRONMENT: 3 steps into the kitchen  OCCUPATION:  Retired   PLOF: Independent  PATIENT GOALS:  To improve mobility   NEXT MD VISIT:  July 3rd    OBJECTIVE:   DIAGNOSTIC FINDINGS:  Nothing post op   PATIENT SURVEYS:  FOTO    COGNITION: Overall cognitive status: Within functional limits for tasks assessed     SENSATION: Occasionally leg feels like it is going to sleep    EDEMA:  Circumferential: right: 46.5 left 36.5  MUSCLE LENGTH:  POSTURE: No Significant postural limitations  PALPATION: No unexpected tenderness to palpation   LOWER EXTREMITY ROM:  Passive ROM Right eval Left eval Right 7/1  Hip flexion     Hip extension     Hip abduction     Hip adduction     Hip internal rotation     Hip external rotation     Knee flexion   96deg AAROM  Knee extension     Ankle dorsiflexion     Ankle plantarflexion     Ankle inversion     Ankle eversion      (Blank rows = not tested)  LOWER EXTREMITY MMT:  MMT Right eval Left eval   Hip flexion     Hip extension     Hip abduction     Hip adduction     Hip internal rotation     Hip external rotation     Knee flexion 84 93   Knee extension -6 -3   Ankle dorsiflexion     Ankle plantarflexion     Ankle inversion     Ankle eversion      (Blank rows = not tested)    GAIT: Limited weight bearing on right; forward trunk flexion onto the walker   TODAY'S TREATMENT:                                                                                                                              DATE:    7/8  Manual: PROM into flexion and extension; STM to medial haMstring and gastroc; grade II and III PA and AP mobilization    Quad set x15  5 sec hold  SLR 3x10  SAQ 3x10   Hamstring stretch 3x20 sec hold seated   Standing slow march with left 2x10  Standing heel/toe 3x10       [facility] Adult PT Treatment:  DATE: 04/27/23 Therapeutic Exercise: Heel slides x 15 R with 2-3 sec holds Quad sets R x 15 with concentration on minimal compensation R SAQ x 20 with focus on quad set Iso R SAQ with ankle pump x  20 SLR R 2x10 without extension lag SLR with hip abdct/adduction 2x10 without extension lag Ball squeeze x 20 NuStep bil UE/LE level 3 x 6 min with seat at a comfortable setting for gentle knee flexion stretch Manual Therapy: R knee flexion overpressure after heel slide x 10 Grade 1-3 knee ext oscillations with sustained hold for knee extension Discussed with pt during manual importance of icing at least 3x/day 15 min due to swelling present behind knee especially Gait Training: GT with cane with PT emphasis on = WB bil LEs, increasing arm swing R due to holding UE stiff by her side, and foot clearance in hallway. Pt did better with equalizing WB upon quickened cadence change   7/1 -PROM R knee flexion and extension -SAQ- 5sec hold 2x10 -SLR 2x10 -LAQ-5sec hold 2x10 -Quad set 5" 2x10 -Heel slide with strap- 2x10- 5sec hold -Heel prop -Gait training with SPC full hall x2     6/28 PROM into flexion and extension  SAQ 3x10  SLR 2x10  Quad set 2x15   Standing heel raise 2x15  Standing left  leg march for left weight bearing 2x10 with UE assist.     Access Code: [email or web address] URL: [email or web address] Date: 04/17/2023 Prepared by: Lorayne Bender  Exercises - Supine Heel Slide with Strap  - 3 x daily - 7 x weekly - 1 sets - 5-10 reps - 5sec  hold - Supine Knee Extension Stretch on Towel Roll  - 3 x daily - 7 x weekly - 3 sets - 5-10 reps - 5 sec  hold - Supine Quad Set  - 3 x daily - 7 x weekly - 3 sets - 10 reps - 5 sec  hold - Small Range Straight Leg Raise  - 2 x daily - 7 x weekly - 3 sets - 5-10 reps  PATIENT EDUCATION:  Education details: HEP, symptom management  Person educated: Patient Education method: Explanation, Demonstration, Tactile cues, Verbal cues, and Handouts Education comprehension: verbalized understanding, returned demonstration, verbal cues required, tactile cues required, and needs further education  HOME EXERCISE PROGRAM: Access  Code: [email or web address] URL: [email or web address] Date: 04/17/2023 Prepared by: Lorayne Bender  Exercises - Supine Heel Slide with Strap  - 3 x daily - 7 x weekly - 1 sets - 5-10 reps - 5sec  hold - Supine Knee Extension Stretch on Towel Roll  - 3 x daily - 7 x weekly - 3 sets - 5-10 reps - 5 sec  hold - Supine Quad Set  - 3 x daily - 7 x weekly - 3 sets - 10 reps - 5 sec  hold - Small Range Straight Leg Raise  - 2 x daily - 7 x weekly - 3 sets - 5-10 reps  ASSESSMENT:  CLINICAL IMPRESSION: The patient is making good progress. Her flexion was measured at 106 degrees. Her extension was about 8 degrees. She was encouraged to focus on her extension. We reviewed standing exercises as well. The patient had mild pain with standing exercises. Therapy will continue to progress as tolerated.      Eval: Patient is a 77 year old female status post right TKA on 04/08/2023.  At this time her pain is well-controlled.  She presents with expected limitations in motion, strength, and functional mobility.  She is using a walker for primary mobility.  She would benefit from skilled therapy to improve her ability to ambulate in the community and to return to gardening and other activities she likes to do.  OBJECTIVE IMPAIRMENTS: Abnormal gait, decreased activity tolerance, decreased knowledge of condition, decreased knowledge of use of DME, decreased mobility, difficulty walking, decreased ROM, decreased strength, and pain.   ACTIVITY LIMITATIONS: carrying, lifting, bending, sitting, standing, squatting, sleeping, stairs, transfers, bed mobility, bathing, dressing, and locomotion level  PARTICIPATION LIMITATIONS: meal prep, cleaning, laundry, driving, shopping, community activity, and yard work  PERSONAL FACTORS: 1-2 comorbidities: osteoperosis, lupus  are also affecting patient's functional outcome.   REHAB POTENTIAL: Excellent  CLINICAL DECISION MAKING: Stable/uncomplicated  EVALUATION COMPLEXITY:  Low   GOALS: Goals reviewed with patient? Yes  SHORT TERM GOALS: Target date: 05/15/2023   Patient will increase passive right knee flexion to 110 degrees  Baseline: Goal status: INITIAL  2.  Patient will increase right knee extension to 0  Baseline:  Goal status: INITIAL  3.  Patient will increase gross bilateral LE strength by 5 pounds  Baseline:  Goal status: INITIAL   LONG TERM GOALS: Target date: 06/12/2023    Patient will return to complete gym and aquatic strengthening program  Baseline:  Goal status: INITIAL  2.  Patient will go up and down 8 steps with reciprocal pattern  Baseline:  Goal status: INITIAL  3.  Patient will ambulate 1000' without increased pain in order to go to the store  Baseline:  Goal status: INITIAL    PLAN:  PT FREQUENCY: 2x/week  PT DURATION: 8 weeks  PLANNED INTERVENTIONS: Therapeutic exercises, Therapeutic activity, Neuromuscular re-education, Balance training, Gait training, Patient/Family education, Self Care, Joint mobilization, Stair training, Aquatic Therapy, Dry Needling, Cryotherapy, Moist heat, Taping, and Ultrasound  PLAN FOR NEXT SESSION: Continue R knee ROM activities, progress to standing therex, did pt increase icing frequency       Dessie Coma, PT 04/29/2023, 1:31 PM  [facility] Outpatient Rehabilitation at [facility] [address] [place], [place], [address] Phone: [phone]   Fax:  [phone]

## 2023-05-03 ENCOUNTER — Ambulatory Visit (HOSPITAL_BASED_OUTPATIENT_CLINIC_OR_DEPARTMENT_OTHER): Payer: Medicare Other | Admitting: Physical Therapy

## 2023-05-03 DIAGNOSIS — M25661 Stiffness of right knee, not elsewhere classified: Secondary | ICD-10-CM

## 2023-05-03 DIAGNOSIS — M25561 Pain in right knee: Secondary | ICD-10-CM

## 2023-05-03 DIAGNOSIS — R2689 Other abnormalities of gait and mobility: Secondary | ICD-10-CM

## 2023-05-03 DIAGNOSIS — R600 Localized edema: Secondary | ICD-10-CM

## 2023-05-03 NOTE — Therapy (Signed)
[facility] Outpatient Rehabilitation at [facility] [address] [place], [place], [address] Phone: [phone]   Fax:  [phone]  Patient Details  Name: [name] MRN: [id number] Date of Birth: [birth year] OUTPATIENT PHYSICAL THERAPY LOWER EXTREMITY TREATMENT   Patient Name: [name] MRN: [id number] DOB:23-Jun-1946, 77 y.o., female Today's Date: 05/03/2023  END OF SESSION:      Past Medical History:  Diagnosis Date   Allergy    Anxiety    Atherosclerosis    Cancer (HCC)    Basal cell skin cancer   Colon polyp    Compression fracture of L1 lumbar vertebra (HCC)    dx by ortho   Environmental allergies    Hypothyroid    Osteoarthritis    Osteoarthritis    Osteoarthritis    Osteopenia    Plantar fasciitis, right    Raynauds phenomenon    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   Sjogren's syndrome (HCC)    Past Surgical History:  Procedure Laterality Date   APPENDECTOMY     BUNIONECTOMY  2008   right foot   CATARACT EXTRACTION     COLONOSCOPY  12/05/2016   Dr.Jacobs   COLONOSCOPY W/ POLYPECTOMY  08/27/2011   ENDOMETRIAL ABLATION     FOOT SURGERY Right 12/17/2017   bone spur   ingrown toenail Right 12/16/2018   LAPAROSCOPIC APPENDECTOMY  10/11/2011   Procedure: APPENDECTOMY LAPAROSCOPIC;  Surgeon: Ardeth Sportsman, MD;  Location: [facility];  Service: General;  Laterality: N/A;  Partial Cecectomy and Appendectomy   POLYPECTOMY     TOTAL KNEE ARTHROPLASTY Right 04/08/2023   Procedure: TOTAL KNEE ARTHROPLASTY;  Surgeon: Ollen Gross, MD;  Location: [facility];  Service: Orthopedics;  Laterality: Right;   WISDOM TOOTH EXTRACTION     Patient Active Problem List   Diagnosis Date Noted   Primary osteoarthritis of right knee 04/08/2023   Encounter for counseling 09/09/2018   Osteopenia of multiple sites 08/22/2017   History of vitamin D deficiency 08/22/2017   Autoimmune disease (HCC) 10/23/2016   Sjogren's  syndrome (HCC) 10/09/2016   Primary osteoarthritis of both hands 10/09/2016   Primary osteoarthritis of both knees 10/09/2016   High risk medication use 09/12/2016   SBO (small bowel obstruction), partial postop 10/16/2011   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  Rationale for Evaluation and Treatment: Rehabilitation  ONSET DATE: 04/08/2023  SUBJECTIVE:   SUBJECTIVE STATEMENT: The patient has been to the MD who is happy with her progress. She is having some swelling. She was advised that this is normal.   PERTINENT HISTORY: Osteopenia, L1 compression fx, OA of the knees and lumbar spine, PF and bone sur in the right foot with bone spur removal, raynaud, lupus, Sjogrens  PAIN:  Are you having pain? Yes: NPRS scale: 2/10 Pain location: medial and central knee Pain description: aching  Aggravating factors: standing and walking  Relieving factors: rest   PRECAUTIONS: None  WEIGHT BEARING RESTRICTIONS: Yes WBAT   FALLS:  Has patient fallen in last 6 months? No  LIVING ENVIRONMENT: 3 steps into the kitchen  OCCUPATION:  Retired   PLOF: Independent  PATIENT GOALS:  To improve mobility   NEXT MD VISIT:  July 3rd   OBJECTIVE:  DIAGNOSTIC FINDINGS:  Nothing post op   PATIENT SURVEYS:  FOTO    COGNITION: Overall cognitive status: Within functional limits for tasks assessed     SENSATION: Occasionally leg feels like it is going to sleep    EDEMA:  Circumferential: right: 46.5 left 36.5  MUSCLE LENGTH:  POSTURE: No Significant postural limitations  PALPATION: No unexpected tenderness to palpation   LOWER EXTREMITY ROM:  Passive ROM Right eval Left eval Right 7/1  Hip flexion     Hip extension     Hip  abduction     Hip adduction     Hip internal rotation     Hip external rotation     Knee flexion   96deg AAROM  Knee extension     Ankle dorsiflexion     Ankle plantarflexion     Ankle inversion     Ankle eversion      (Blank rows = not tested)  LOWER EXTREMITY MMT:  MMT Right eval Left eval   Hip flexion     Hip extension     Hip abduction     Hip adduction     Hip internal rotation     Hip external rotation     Knee flexion 84 93   Knee extension -6 -3   Ankle dorsiflexion     Ankle plantarflexion     Ankle inversion     Ankle eversion      (Blank rows = not tested)    GAIT: Limited weight bearing on right; forward trunk flexion onto the walker   TODAY'S TREATMENT:                                                                                                                              DATE:   7/12 Manual: PROM into flexion and extension; STM to medial hamstring and gastroc; grade II and III PA and AP mobilization    Quad set x15  5 sec hold  SLR 3x10  SAQ 3x10   Hamstring stretch 3x20 sec hold seated   Standing slow march with left 2x10  Standing heel/toe 3x10    7/8  Manual: PROM into flexion and extension; STM to medial haMstring and gastroc; grade II and III PA and AP mobilization    Quad set x15  5 sec hold  SLR 3x10 mild pain in the back  SAQ 3x10 RPE of 4   Hamstring stretch 3x20 sec hold seated   Standing slow march with left 2x10  Standing heel/toe 3x10       [facility] Adult PT Treatment:                                                DATE: 04/27/23 Therapeutic Exercise: Heel slides x 15 R with 2-3 sec holds [employer] R  x 15 with concentration on minimal compensation R SAQ x 20 with focus on quad set Iso R SAQ with ankle pump x 20 SLR R 2x10 without extension lag SLR with hip abdct/adduction 2x10 without extension lag Ball squeeze x 20 NuStep bil UE/LE level 3 x 6 min with seat at a comfortable setting for gentle knee flexion  stretch Manual Therapy: R knee flexion overpressure after heel slide x 10 Grade 1-3 knee ext oscillations with sustained hold for knee extension Discussed with pt during manual importance of icing at least 3x/day 15 min due to swelling present behind knee especially Gait Training: GT with cane with PT emphasis on = WB bil LEs, increasing arm swing R due to holding UE stiff by her side, and foot clearance in hallway. Pt did better with equalizing WB upon quickened cadence change   7/1 -PROM R knee flexion and extension -SAQ- 5sec hold 2x10 -SLR 2x10 -LAQ-5sec hold 2x10 -Quad set 5" 2x10 -Heel slide with strap- 2x10- 5sec hold -Heel prop -Gait training with SPC full hall x2     6/28 PROM into flexion and extension  SAQ 3x10  SLR 2x10  Quad set 2x15   Standing heel raise 2x15  Standing left  leg march for left weight bearing 2x10 with UE assist.     Access Code: [email or web address] URL: [email or web address] Date: 04/17/2023 Prepared by: Lorayne Bender  Exercises - Supine Heel Slide with Strap  - 3 x daily - 7 x weekly - 1 sets - 5-10 reps - 5sec  hold - Supine Knee Extension Stretch on Towel Roll  - 3 x daily - 7 x weekly - 3 sets - 5-10 reps - 5 sec  hold - Supine Quad Set  - 3 x daily - 7 x weekly - 3 sets - 10 reps - 5 sec  hold - Small Range Straight Leg Raise  - 2 x daily - 7 x weekly - 3 sets - 5-10 reps  PATIENT EDUCATION:  Education details: HEP, symptom management  Person educated: Patient Education method: Explanation, Demonstration, Tactile cues, Verbal cues, and Handouts Education comprehension: verbalized understanding, returned demonstration, verbal cues required, tactile cues required, and needs further education  HOME EXERCISE PROGRAM: Access Code: [email or web address] URL: [email or web address] Date: 04/17/2023 Prepared by: Lorayne Bender  Exercises - Supine Heel Slide with Strap  - 3 x daily - 7 x weekly - 1 sets - 5-10 reps - 5sec   hold - Supine Knee Extension Stretch on Towel Roll  - 3 x daily - 7 x weekly - 3 sets - 5-10 reps - 5 sec  hold - Supine Quad Set  - 3 x daily - 7 x weekly - 3 sets - 10 reps - 5 sec  hold - Small Range Straight Leg Raise  - 2 x daily - 7 x weekly - 3 sets - 5-10 reps  ASSESSMENT:  CLINICAL IMPRESSION: The patient continues to make good progress. Her total arc of motion was measured at 6-110. She tolerated her there-ex well. She is likely progressing towards stair training soon. She did have mild pain in her back with SLR We discussed listening to her back and minimizing lateral movement when she is not using the cane. Therapy will continue to progress as tolerated. Her exercises where kept consistent today. Add long arc next visit. Consider loading exercises.      Eval: Patient is a 77 year old female status post right TKA on 04/08/2023.  At this time her pain is well-controlled.  She presents with expected limitations in motion, strength, and functional mobility.  She is using a walker for primary mobility.  She would benefit from skilled therapy to improve her ability to ambulate in the community and to return to gardening and other activities she likes to do.  OBJECTIVE IMPAIRMENTS: Abnormal gait, decreased activity tolerance, decreased knowledge of condition, decreased knowledge of use of DME, decreased mobility, difficulty walking, decreased ROM, decreased strength, and pain.   ACTIVITY LIMITATIONS: carrying, lifting, bending, sitting, standing, squatting, sleeping, stairs, transfers, bed mobility, bathing, dressing, and locomotion level  PARTICIPATION LIMITATIONS: meal prep, cleaning, laundry, driving, shopping, community activity, and yard work  PERSONAL FACTORS: 1-2 comorbidities: osteoperosis, lupus  are also affecting patient's functional outcome.   REHAB POTENTIAL: Excellent  CLINICAL DECISION MAKING: Stable/uncomplicated  EVALUATION COMPLEXITY: Low   GOALS: Goals reviewed  with patient? Yes  SHORT TERM GOALS: Target date: 05/15/2023   Patient will increase passive right knee flexion to 110 degrees  Baseline: Goal status: INITIAL  2.  Patient will increase right knee extension to 0  Baseline:  Goal status: INITIAL  3.  Patient will increase gross bilateral LE strength by 5 pounds  Baseline:  Goal status: INITIAL   LONG TERM GOALS: Target date: 06/12/2023    Patient will return to complete gym and aquatic strengthening program  Baseline:  Goal status: INITIAL  2.  Patient will go up and down 8 steps with reciprocal pattern  Baseline:  Goal status: INITIAL  3.  Patient will ambulate 1000' without increased pain in order to go to the store  Baseline:  Goal status: INITIAL    PLAN:  PT FREQUENCY: 2x/week  PT DURATION: 8 weeks  PLANNED INTERVENTIONS: Therapeutic exercises, Therapeutic activity, Neuromuscular re-education, Balance training, Gait training, Patient/Family education, Self Care, Joint mobilization, Stair training, Aquatic Therapy, Dry Needling, Cryotherapy, Moist heat, Taping, and Ultrasound  PLAN FOR NEXT SESSION: Continue R knee ROM activities, progress to standing therex, did pt increase icing frequency; consider LAQ and steps       Dessie Coma, PT 05/03/2023, 10:57 AM  [facility] Outpatient Rehabilitation at [facility] [address] [place], [place], [address] Phone: [phone]   Fax:  [phone]

## 2023-05-08 ENCOUNTER — Ambulatory Visit (HOSPITAL_BASED_OUTPATIENT_CLINIC_OR_DEPARTMENT_OTHER): Payer: Medicare Other

## 2023-05-08 ENCOUNTER — Encounter (HOSPITAL_BASED_OUTPATIENT_CLINIC_OR_DEPARTMENT_OTHER): Payer: Self-pay

## 2023-05-08 DIAGNOSIS — R2689 Other abnormalities of gait and mobility: Secondary | ICD-10-CM

## 2023-05-08 DIAGNOSIS — M25561 Pain in right knee: Secondary | ICD-10-CM | POA: Diagnosis not present

## 2023-05-08 DIAGNOSIS — M25661 Stiffness of right knee, not elsewhere classified: Secondary | ICD-10-CM

## 2023-05-08 DIAGNOSIS — R600 Localized edema: Secondary | ICD-10-CM

## 2023-05-08 NOTE — Therapy (Signed)
[facility] Outpatient Rehabilitation at [facility] [address] [place], [place], [address] Phone: [phone]   Fax:  [phone]  Patient Details  Name: [name] MRN: [id number] Date of Birth: May 12, 1946 OUTPATIENT PHYSICAL THERAPY LOWER EXTREMITY TREATMENT   Patient Name: [name] MRN: [id number] DOB:1946-06-27, 77 y.o., female Today's Date: 05/09/2023  END OF SESSION:  PT End of Session - 05/08/23 1111     Visit Number 7    Number of Visits 16    Date for PT Re-Evaluation 06/12/23    Authorization Type progress note done on visit 9    Progress Note Due on Visit 19    PT Start Time 1018    PT Stop Time 1100    PT Time Calculation (min) 42 min    Activity Tolerance Patient tolerated treatment well    Behavior During Therapy WFL for tasks assessed/performed                Past Medical History:  Diagnosis Date   Allergy    Anxiety    Atherosclerosis    Cancer (HCC)    Basal cell skin cancer   Colon polyp    Compression fracture of L1 lumbar vertebra (HCC)    dx by ortho   Environmental allergies    Hypothyroid    Osteoarthritis    Osteoarthritis    Osteoarthritis    Osteopenia    Plantar fasciitis, right    Raynauds phenomenon    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   Sjogren's syndrome (HCC)    Past Surgical History:  Procedure Laterality Date   APPENDECTOMY     BUNIONECTOMY  2008   right foot   CATARACT EXTRACTION     COLONOSCOPY  12/05/2016   Dr.Jacobs   COLONOSCOPY W/ POLYPECTOMY  08/27/2011   ENDOMETRIAL ABLATION     FOOT SURGERY Right 12/17/2017   bone spur   ingrown toenail Right 12/16/2018   LAPAROSCOPIC APPENDECTOMY  10/11/2011   Procedure: APPENDECTOMY LAPAROSCOPIC;  Surgeon: Ardeth Sportsman, MD;  Location: [facility];  Service: General;  Laterality: N/A;  Partial Cecectomy and Appendectomy   POLYPECTOMY     TOTAL KNEE ARTHROPLASTY Right 04/08/2023   Procedure: TOTAL KNEE  ARTHROPLASTY;  Surgeon: Ollen Gross, MD;  Location: [facility];  Service: Orthopedics;  Laterality: Right;   WISDOM TOOTH EXTRACTION     Patient Active Problem List   Diagnosis Date Noted   Primary osteoarthritis of right knee 04/08/2023   Encounter for counseling 09/09/2018   Osteopenia of multiple sites 08/22/2017   History of vitamin D deficiency 08/22/2017   Autoimmune disease (HCC) 10/23/2016   Sjogren's syndrome (HCC) 10/09/2016   Primary osteoarthritis of both hands 10/09/2016   Primary osteoarthritis of both knees 10/09/2016   High risk medication use 09/12/2016   SBO (small bowel obstruction), partial postop 10/16/2011   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   BILIARY DYSKINESIA, 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% 07/20/2009    PCP: Selena Batten MD  REFERRING PROVIDER: Dr Loel Ro   REFERRING DIAG:   M17.11 (ICD-10-CM) - Unilateral primary osteoarthritis, right knee    THERAPY DIAG:  Acute pain of right knee  Localized edema  Other abnormalities of gait and mobility  Stiffness of right knee, not elsewhere classified  Rationale for Evaluation and Treatment: Rehabilitation  ONSET DATE: 04/08/2023  SUBJECTIVE:   SUBJECTIVE STATEMENT: Pt reports "my knee is doing great!"   PERTINENT HISTORY: Osteopenia, L1 compression fx, OA of the knees and  lumbar spine, PF and bone sur in the right foot with bone spur removal, raynaud, lupus, Sjogrens  PAIN:  Are you having pain? Yes: NPRS scale: 2/10 Pain location: medial and central knee Pain description: aching  Aggravating factors: standing and walking  Relieving factors: rest   PRECAUTIONS: None  WEIGHT BEARING RESTRICTIONS: Yes WBAT   FALLS:  Has patient fallen in last 6 months? No  LIVING ENVIRONMENT: 3 steps into the kitchen  OCCUPATION:  Retired   PLOF: Independent  PATIENT GOALS:  To improve mobility   NEXT MD VISIT:  July 3rd   OBJECTIVE:   DIAGNOSTIC FINDINGS:  Nothing post op   PATIENT SURVEYS:   FOTO    COGNITION: Overall cognitive status: Within functional limits for tasks assessed     SENSATION: Occasionally leg feels like it is going to sleep    EDEMA:  Circumferential: right: 46.5 left 36.5  MUSCLE LENGTH:  POSTURE: No Significant postural limitations  PALPATION: No unexpected tenderness to palpation   LOWER EXTREMITY ROM:  Passive ROM Right eval Left eval Right 7/1 Right 7/17  Hip flexion      Hip extension      Hip abduction      Hip adduction      Hip internal rotation      Hip external rotation      Knee flexion   96deg AAROM 118deg AROM  Knee extension    6deg AROM  Ankle dorsiflexion      Ankle plantarflexion      Ankle inversion      Ankle eversion       (Blank rows = not tested)  LOWER EXTREMITY MMT:  MMT Right eval Left eval   Hip flexion     Hip extension     Hip abduction     Hip adduction     Hip internal rotation     Hip external rotation     Knee flexion 84 93   Knee extension -6 -3   Ankle dorsiflexion     Ankle plantarflexion     Ankle inversion     Ankle eversion      (Blank rows = not tested)    GAIT: Limited weight bearing on right; forward trunk flexion onto the walker   TODAY'S TREATMENT:                                                                                                                              DATE:    7/17 Manual: PROM into flexion and extension,patella mobilizations  SLR 3x10    Hamstring stretch 3x30sec long sit  Standing slow march bilateral, min UE support  Standing heel/toe 3x10 Partial squats at rail 2x10  Step ups 4" 2x10  Gait in hall without cane- cues for heel contact and toe off- full hall x2  7/12 Manual: PROM into flexion and extension; STM to medial hamstring and gastroc; grade II and III PA and  AP mobilization    Quad set x15  5 sec hold  SLR 3x10  SAQ 3x10   Hamstring stretch 3x20 sec hold seated   Standing slow march with left 2x10  Standing heel/toe  3x10    7/8  Manual: PROM into flexion and extension; STM to medial haMstring and gastroc; grade II and III PA and AP mobilization    Quad set x15  5 sec hold  SLR 3x10 mild pain in the back  SAQ 3x10 RPE of 4   Hamstring stretch 3x20 sec hold seated   Standing slow march with left 2x10  Standing heel/toe 3x10       [facility] Adult PT Treatment:                                                DATE: 04/27/23 Therapeutic Exercise: Heel slides x 15 R with 2-3 sec holds Quad sets R x 15 with concentration on minimal compensation R SAQ x 20 with focus on quad set Iso R SAQ with ankle pump x 20 SLR R 2x10 without extension lag SLR with hip abdct/adduction 2x10 without extension lag Ball squeeze x 20 NuStep bil UE/LE level 3 x 6 min with seat at a comfortable setting for gentle knee flexion stretch Manual Therapy: R knee flexion overpressure after heel slide x 10 Grade 1-3 knee ext oscillations with sustained hold for knee extension Discussed with pt during manual importance of icing at least 3x/day 15 min due to swelling present behind knee especially Gait Training: GT with cane with PT emphasis on = WB bil LEs, increasing arm swing R due to holding UE stiff by her side, and foot clearance in hallway. Pt did better with equalizing WB upon quickened cadence change  PATIENT EDUCATION:  Education details: HEP, symptom management  Person educated: Patient Education method: Explanation, Demonstration, Tactile cues, Verbal cues, and Handouts Education comprehension: verbalized understanding, returned demonstration, verbal cues required, tactile cues required, and needs further education  HOME EXERCISE PROGRAM: Access Code: [email or web address] URL: [email or web address] Date: 04/17/2023 Prepared by: Lorayne Bender  Exercises - Supine Heel Slide with Strap  - 3 x daily - 7 x weekly - 1 sets - 5-10 reps - 5sec  hold - Supine Knee Extension Stretch on Towel Roll  - 3 x daily - 7 x weekly  - 3 sets - 5-10 reps - 5 sec  hold - Supine Quad Set  - 3 x daily - 7 x weekly - 3 sets - 10 reps - 5 sec  hold - Small Range Straight Leg Raise  - 2 x daily - 7 x weekly - 3 sets - 5-10 reps  ASSESSMENT:  CLINICAL IMPRESSION: Measured knee flexion at 118deg and has met corresponding STG. . Pt has excellent tolerance for therex today. Initiated step ups at 4" step with no compaints. She denied pain throughout session. . Some cues required for partial squats. Will continue to progress as tolerated with     Eval: Patient is a 77 year old female status post right TKA on 04/08/2023.  At this time her pain is well-controlled.  She presents with expected limitations in motion, strength, and functional mobility.  She is using a walker for primary mobility.  She would benefit from skilled therapy to improve her ability to ambulate in the community and to return to gardening and other activities she  likes to do.  OBJECTIVE IMPAIRMENTS: Abnormal gait, decreased activity tolerance, decreased knowledge of condition, decreased knowledge of use of DME, decreased mobility, difficulty walking, decreased ROM, decreased strength, and pain.   ACTIVITY LIMITATIONS: carrying, lifting, bending, sitting, standing, squatting, sleeping, stairs, transfers, bed mobility, bathing, dressing, and locomotion level  PARTICIPATION LIMITATIONS: meal prep, cleaning, laundry, driving, shopping, community activity, and yard work  PERSONAL FACTORS: 1-2 comorbidities: osteoperosis, lupus  are also affecting patient's functional outcome.   REHAB POTENTIAL: Excellent  CLINICAL DECISION MAKING: Stable/uncomplicated  EVALUATION COMPLEXITY: Low   GOALS: Goals reviewed with patient? Yes  SHORT TERM GOALS: Target date: 05/15/2023   Patient will increase passive right knee flexion to 110 degrees  Baseline: Goal status: MET 7/17  2.  Patient will increase right knee extension to 0  Baseline:  Goal status: INITIAL  3.   Patient will increase gross bilateral LE strength by 5 pounds  Baseline:  Goal status: INITIAL   LONG TERM GOALS: Target date: 06/12/2023    Patient will return to complete gym and aquatic strengthening program  Baseline:  Goal status: INITIAL  2.  Patient will go up and down 8 steps with reciprocal pattern  Baseline:  Goal status: INITIAL  3.  Patient will ambulate 1000' without increased pain in order to go to the store  Baseline:  Goal status: INITIAL    PLAN:  PT FREQUENCY: 2x/week  PT DURATION: 8 weeks  PLANNED INTERVENTIONS: Therapeutic exercises, Therapeutic activity, Neuromuscular re-education, Balance training, Gait training, Patient/Family education, Self Care, Joint mobilization, Stair training, Aquatic Therapy, Dry Needling, Cryotherapy, Moist heat, Taping, and Ultrasound  PLAN FOR NEXT SESSION: Continue R knee ROM activities, progress to standing therex, did pt increase icing frequency; consider LAQ and steps       Lenore Manner, PTA 05/09/2023, 7:49 AM  [facility] Outpatient Rehabilitation at [facility] [address] [place], [place], [address] Phone: [phone]   Fax:  [phone]

## 2023-05-10 ENCOUNTER — Ambulatory Visit (HOSPITAL_BASED_OUTPATIENT_CLINIC_OR_DEPARTMENT_OTHER): Payer: Medicare Other | Admitting: Physical Therapy

## 2023-05-10 ENCOUNTER — Encounter (HOSPITAL_BASED_OUTPATIENT_CLINIC_OR_DEPARTMENT_OTHER): Payer: Self-pay | Admitting: Physical Therapy

## 2023-05-10 DIAGNOSIS — M25561 Pain in right knee: Secondary | ICD-10-CM

## 2023-05-10 DIAGNOSIS — M25661 Stiffness of right knee, not elsewhere classified: Secondary | ICD-10-CM

## 2023-05-10 DIAGNOSIS — R2689 Other abnormalities of gait and mobility: Secondary | ICD-10-CM

## 2023-05-10 DIAGNOSIS — R600 Localized edema: Secondary | ICD-10-CM

## 2023-05-10 NOTE — Therapy (Unsigned)
[facility] Outpatient Rehabilitation at [facility] [address] [place], [place], [address] Phone: [phone]   Fax:  [phone]  Patient Details  Name: [name] MRN: [id number] Date of Birth: 1946/02/19 OUTPATIENT PHYSICAL THERAPY LOWER EXTREMITY TREATMENT   Patient Name: [name] MRN: [id number] DOB:May 24, 1946, 77 y.o., female Today's Date: 05/13/2023  END OF SESSION:  PT End of Session - 05/13/23 0807     Visit Number 8    Number of Visits 16    Date for PT Re-Evaluation 06/12/23    Authorization Type progress note visit 10    PT Start Time 1015    PT Stop Time 1058    PT Time Calculation (min) 43 min    Activity Tolerance Patient tolerated treatment well    Behavior During Therapy [facility] for tasks assessed/performed                 Past Medical History:  Diagnosis Date   Allergy    Anxiety    Atherosclerosis    Cancer (HCC)    Basal cell skin cancer   Colon polyp    Compression fracture of L1 lumbar vertebra (HCC)    dx by ortho   Environmental allergies    Hypothyroid    Osteoarthritis    Osteoarthritis    Osteoarthritis    Osteopenia    Plantar fasciitis, right    Raynauds phenomenon    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   Sjogren's syndrome (HCC)    Past Surgical History:  Procedure Laterality Date   APPENDECTOMY     BUNIONECTOMY  2008   right foot   CATARACT EXTRACTION     COLONOSCOPY  12/05/2016   Dr.Jacobs   COLONOSCOPY W/ POLYPECTOMY  08/27/2011   ENDOMETRIAL ABLATION     FOOT SURGERY Right 12/17/2017   bone spur   ingrown toenail Right 12/16/2018   LAPAROSCOPIC APPENDECTOMY  10/11/2011   Procedure: APPENDECTOMY LAPAROSCOPIC;  Surgeon: Ardeth Sportsman, MD;  Location: [facility];  Service: General;  Laterality: N/A;  Partial Cecectomy and Appendectomy   POLYPECTOMY     TOTAL KNEE ARTHROPLASTY Right 04/08/2023   Procedure: TOTAL KNEE ARTHROPLASTY;  Surgeon: Ollen Gross, MD;   Location: [facility];  Service: Orthopedics;  Laterality: Right;   WISDOM TOOTH EXTRACTION     Patient Active Problem List   Diagnosis Date Noted   Primary osteoarthritis of right knee 04/08/2023   Encounter for counseling 09/09/2018   Osteopenia of multiple sites 08/22/2017   History of vitamin D deficiency 08/22/2017   Autoimmune disease (HCC) 10/23/2016   Sjogren's syndrome (HCC) 10/09/2016   Primary osteoarthritis of both hands 10/09/2016   Primary osteoarthritis of both knees 10/09/2016   High risk medication use 09/12/2016   SBO (small bowel obstruction), partial postop 10/16/2011   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  She reports most of the time she is not using a cane around the house.  She has not yet stopped using  a cane outside the house.  PERTINENT HISTORY: Osteopenia, L1 compression fx, OA of the knees and lumbar spine, PF and bone sur in the right foot with bone spur removal, raynaud, lupus, Sjogrens  PAIN:  Are you having pain? Yes: NPRS scale: 2/10 Pain location: medial and central knee Pain description: aching  Aggravating factors: standing and walking  Relieving factors: rest   PRECAUTIONS: None  WEIGHT BEARING RESTRICTIONS: Yes WBAT   FALLS:  Has patient fallen in last 6 months? No  LIVING ENVIRONMENT: 3 steps into the kitchen  OCCUPATION:  Retired   PLOF: Independent  PATIENT GOALS:  To improve mobility   NEXT MD VISIT:   July 3rd   OBJECTIVE:   DIAGNOSTIC FINDINGS:  Nothing post op   PATIENT SURVEYS:  FOTO    COGNITION: Overall cognitive status: Within functional limits for tasks assessed     SENSATION: Occasionally leg feels like it is going to sleep    EDEMA:  Circumferential: right: 46.5 left 36.5  MUSCLE LENGTH:  POSTURE: No Significant postural limitations  PALPATION: No unexpected tenderness to palpation   LOWER EXTREMITY ROM:  Passive ROM Right eval Left eval Right 7/1 Right 7/17 Right  7/19  Hip flexion       Hip extension       Hip abduction       Hip adduction       Hip internal rotation       Hip external rotation       Knee flexion   96deg AAROM 118deg AROM   Knee extension    6deg AROM -3  Ankle dorsiflexion       Ankle plantarflexion       Ankle inversion       Ankle eversion        (Blank rows = not tested)  LOWER EXTREMITY MMT:  MMT Right eval Left eval   Hip flexion     Hip extension     Hip abduction     Hip adduction     Hip internal rotation     Hip external rotation     Knee flexion 84 93   Knee extension -6 -3   Ankle dorsiflexion     Ankle plantarflexion     Ankle inversion     Ankle eversion      (Blank rows = not tested)    GAIT: Limited weight bearing on right; forward trunk flexion onto the walker   TODAY'S TREATMENT:                                                                                                                              DATE:   7/19 Manual: PROM into flexion and extension,patella mobilizations; trigger point release to gastroc and hamstring  SLR 3x10  SAQ 3x12 1.5 lbs  LAQ 3x10 1.5 lbs   Mini squats 3x10   Step up 4 inch 2 x 10  7/17 Manual: PROM into flexion and extension,patella mobilizations  SLR 3x10    Hamstring stretch 3x30sec long sit  Standing slow march bilateral, min UE support  Standing heel/toe 3x10 Partial squats at rail 2x10  Step ups 4" 2x10  Gait in hall without  cane- cues for heel contact and toe off- full hall x2  7/12 Manual: PROM into flexion and extension; STM to medial hamstring and gastroc; grade II and III PA and AP mobilization    Quad set x15  5 sec hold  SLR 3x10  SAQ 3x10   Hamstring stretch 3x20 sec hold seated   Standing slow march with left 2x10  Standing heel/toe 3x10    7/8  Manual: PROM into flexion and extension; STM to medial haMstring and gastroc; grade II and III PA and AP mobilization    Quad set x15  5 sec hold  SLR 3x10 mild pain in the back  SAQ 3x10 RPE of 4   Hamstring stretch 3x20 sec hold seated   Standing slow march with left 2x10  Standing heel/toe 3x10        PATIENT EDUCATION:  Education details: HEP, symptom management  Person educated: Patient Education method: Explanation, Demonstration, Tactile cues, Verbal cues, and Handouts Education comprehension: verbalized understanding, returned demonstration, verbal cues required, tactile cues required, and needs further education  HOME EXERCISE PROGRAM: Access Code: [email or web address] URL: [email or web address] Date: 04/17/2023 Prepared by: Lorayne Bender  Exercises - Supine Heel Slide with Strap  - 3 x daily - 7 x weekly - 1 sets - 5-10 reps - 5sec  hold - Supine Knee Extension Stretch on Towel Roll  - 3 x daily - 7 x weekly - 3 sets - 5-10 reps - 5 sec  hold - Supine Quad Set  - 3 x daily - 7 x weekly - 3 sets - 10 reps - 5 sec  hold - Small Range Straight Leg Raise  - 2 x daily - 7 x weekly - 3 sets - 5-10 reps  ASSESSMENT:  CLINICAL IMPRESSION: Patient continues to make good progress.  Has been able to progress functional strengthening like squats and step ups without any significant pain.  Her range of motion is progressing well.  She still has a extension limitation but is improving.  She is working hard.  We continue to work on manual therapy here to improve her extension.  She was advised to start walking outside without the cane  only when she feels comfortable.  Will continue to progress as tolerated. Eval: Patient is a 77 year old female status post right TKA on 04/08/2023.  At this time her pain is well-controlled.  She presents with expected limitations in motion, strength, and functional mobility.  She is using a walker for primary mobility.  She would benefit from skilled therapy to improve her ability to ambulate in the community and to return to gardening and other activities she likes to do.  OBJECTIVE IMPAIRMENTS: Abnormal gait, decreased activity tolerance, decreased knowledge of condition, decreased knowledge of use of DME, decreased mobility, difficulty walking, decreased ROM, decreased strength, and pain.   ACTIVITY LIMITATIONS: carrying, lifting, bending, sitting, standing, squatting, sleeping, stairs, transfers, bed mobility, bathing, dressing, and locomotion level  PARTICIPATION LIMITATIONS: meal prep, cleaning, laundry, driving, shopping, community activity, and yard work  PERSONAL FACTORS: 1-2 comorbidities: osteoperosis, lupus  are also affecting patient's functional outcome.   REHAB POTENTIAL: Excellent  CLINICAL DECISION MAKING: Stable/uncomplicated  EVALUATION COMPLEXITY: Low   GOALS: Goals reviewed with patient? Yes  SHORT TERM GOALS: Target date: 05/15/2023   Patient will increase passive right knee flexion to 110 degrees  Baseline: Goal status: MET 7/17  2.  Patient will increase right knee extension to 0  Baseline:  Goal status: INITIAL  3.  Patient will increase gross bilateral LE strength by 5 pounds  Baseline:  Goal status: INITIAL   LONG TERM GOALS: Target date: 06/12/2023    Patient will return to complete gym and aquatic strengthening program  Baseline:  Goal status: INITIAL  2.  Patient will go up and down 8 steps with reciprocal pattern  Baseline:  Goal status: INITIAL  3.  Patient will ambulate 1000' without increased pain in order to go to the store   Baseline:  Goal status: INITIAL    PLAN:  PT FREQUENCY: 2x/week  PT DURATION: 8 weeks  PLANNED INTERVENTIONS: Therapeutic exercises, Therapeutic activity, Neuromuscular re-education, Balance training, Gait training, Patient/Family education, Self Care, Joint mobilization, Stair training, Aquatic Therapy, Dry Needling, Cryotherapy, Moist heat, Taping, and Ultrasound  PLAN FOR NEXT SESSION: Continue R knee ROM activities, progress to standing therex, did pt increase icing frequency; consider LAQ and steps       Dessie Coma, PT 05/13/2023, 8:08 AM  [facility] Outpatient Rehabilitation at [facility] [address] [place], [place], [address] Phone: [phone]   Fax:  [phone]

## 2023-05-13 ENCOUNTER — Encounter (HOSPITAL_BASED_OUTPATIENT_CLINIC_OR_DEPARTMENT_OTHER): Payer: Self-pay | Admitting: Physical Therapy

## 2023-05-15 ENCOUNTER — Encounter (HOSPITAL_BASED_OUTPATIENT_CLINIC_OR_DEPARTMENT_OTHER): Payer: Self-pay

## 2023-05-15 ENCOUNTER — Ambulatory Visit (HOSPITAL_BASED_OUTPATIENT_CLINIC_OR_DEPARTMENT_OTHER): Payer: Medicare Other

## 2023-05-15 DIAGNOSIS — R2689 Other abnormalities of gait and mobility: Secondary | ICD-10-CM

## 2023-05-15 DIAGNOSIS — R600 Localized edema: Secondary | ICD-10-CM

## 2023-05-15 DIAGNOSIS — M25661 Stiffness of right knee, not elsewhere classified: Secondary | ICD-10-CM

## 2023-05-15 DIAGNOSIS — M25561 Pain in right knee: Secondary | ICD-10-CM | POA: Diagnosis not present

## 2023-05-15 NOTE — Therapy (Signed)
[facility] Outpatient Rehabilitation at [facility] [address] [place], [place], [address] Phone: [phone]   Fax:  [phone]  Patient Details  Name: [name] MRN: [id number] Date of Birth: October 26, 1945 OUTPATIENT PHYSICAL THERAPY LOWER EXTREMITY TREATMENT   Patient Name: [name] MRN: [id number] DOB:1946-08-25, 77 y.o., female Today's Date: 05/15/2023  END OF SESSION:  PT End of Session - 05/15/23 1129     Visit Number 9    Number of Visits 16    Date for PT Re-Evaluation 06/12/23    Authorization Type progress note visit 19    Progress Note Due on Visit 19    PT Start Time 1015    PT Stop Time 1057    PT Time Calculation (min) 42 min    Activity Tolerance Patient tolerated treatment well    Behavior During Therapy WFL for tasks assessed/performed                  Past Medical History:  Diagnosis Date   Allergy    Anxiety    Atherosclerosis    Cancer (HCC)    Basal cell skin cancer   Colon polyp    Compression fracture of L1 lumbar vertebra (HCC)    dx by ortho   Environmental allergies    Hypothyroid    Osteoarthritis    Osteoarthritis    Osteoarthritis    Osteopenia    Plantar fasciitis, right    Raynauds phenomenon    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   Sjogren's syndrome (HCC)    Past Surgical History:  Procedure Laterality Date   APPENDECTOMY     BUNIONECTOMY  2008   right foot   CATARACT EXTRACTION     COLONOSCOPY  12/05/2016   Dr.Jacobs   COLONOSCOPY W/ POLYPECTOMY  08/27/2011   ENDOMETRIAL ABLATION     FOOT SURGERY Right 12/17/2017   bone spur   ingrown toenail Right 12/16/2018   LAPAROSCOPIC APPENDECTOMY  10/11/2011   Procedure: APPENDECTOMY LAPAROSCOPIC;  Surgeon: Ardeth Sportsman, MD;  Location: [facility];  Service: General;  Laterality: N/A;  Partial Cecectomy and Appendectomy   POLYPECTOMY     TOTAL KNEE ARTHROPLASTY Right 04/08/2023   Procedure: TOTAL KNEE  ARTHROPLASTY;  Surgeon: Ollen Gross, MD;  Location: [facility];  Service: Orthopedics;  Laterality: Right;   WISDOM TOOTH EXTRACTION     Patient Active Problem List   Diagnosis Date Noted   Primary osteoarthritis of right knee 04/08/2023   Encounter for counseling 09/09/2018   Osteopenia of multiple sites 08/22/2017   History of vitamin D deficiency 08/22/2017   Autoimmune disease (HCC) 10/23/2016   Sjogren's syndrome (HCC) 10/09/2016   Primary osteoarthritis of both hands 10/09/2016   Primary osteoarthritis of both knees 10/09/2016   High risk medication use 09/12/2016   SBO (small bowel obstruction), partial postop 10/16/2011   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 Has been released to drive. "She said I didn't need to do PT anymore unless you  guys recommend it. She was pleased with my progress." She reports having some medial knee pain last night and today. 4/10 pain level.   PERTINENT HISTORY: Osteopenia, L1 compression fx, OA of the knees and lumbar spine, PF and bone sur in the right foot with bone spur removal, raynaud, lupus, Sjogrens  PAIN:  Are you having pain? Yes: NPRS scale: 4/10 Pain location: medial and central knee Pain description: aching  Aggravating factors: standing and walking  Relieving factors: rest   PRECAUTIONS: None  WEIGHT BEARING RESTRICTIONS: Yes WBAT   FALLS:  Has patient fallen in last 6 months? No  LIVING ENVIRONMENT: 3 steps into the  kitchen  OCCUPATION:  Retired   PLOF: Independent  PATIENT GOALS:  To improve mobility   NEXT MD VISIT:  July 3rd   OBJECTIVE:   DIAGNOSTIC FINDINGS:  Nothing post op   PATIENT SURVEYS:  FOTO    COGNITION: Overall cognitive status: Within functional limits for tasks assessed     SENSATION: Occasionally leg feels like it is going to sleep    EDEMA:  Circumferential: right: 46.5 left 36.5  MUSCLE LENGTH:  POSTURE: No Significant postural limitations  PALPATION: No unexpected tenderness to palpation   LOWER EXTREMITY ROM:  Passive ROM Right eval Left eval Right 7/1 Right 7/17 Right  7/19  Hip flexion       Hip extension       Hip abduction       Hip adduction       Hip internal rotation       Hip external rotation       Knee flexion   96deg AAROM 118deg AROM   Knee extension    6deg AROM -3  Ankle dorsiflexion       Ankle plantarflexion       Ankle inversion       Ankle eversion        (Blank rows = not tested)  LOWER EXTREMITY MMT:  MMT Right eval Left eval   Hip flexion     Hip extension     Hip abduction     Hip adduction     Hip internal rotation     Hip external rotation     Knee flexion 84 93   Knee extension -6 -3   Ankle dorsiflexion     Ankle plantarflexion     Ankle inversion     Ankle eversion      (Blank rows = not tested)    GAIT: Limited weight bearing on right; forward trunk flexion onto the walker   TODAY'S TREATMENT:                                                                                                                              DATE:    7/24  Nu-step PROM SLR 3x10   Gait in hall x2 no cane  LAQ 2x10 3 lbs   Mini squats 2x10 (reviewed technique)  Heel raises (>RLE) 2x10  Step up 4 inch 1x 10 (had some pain so only did one set)  Tandem balance R foot behind L  30sec x2 Walking marches at rail x2 laps  7/19 Manual: PROM into flexion and extension,patella mobilizations; trigger point release  to gastroc and hamstring  SLR 3x10  SAQ 3x12 1.5 lbs  LAQ 3x10 1.5 lbs   Mini squats 3x10   Step up 4 inch 2 x 10      7/17 Manual: PROM into flexion and extension,patella mobilizations  SLR 3x10    Hamstring stretch 3x30sec long sit  Standing slow march bilateral, min UE support  Standing heel/toe 3x10 Partial squats at rail 2x10  Step ups 4" 2x10  Gait in hall without cane- cues for heel contact and toe off- full hall x2  7/12 Manual: PROM into flexion and extension; STM to medial hamstring and gastroc; grade II and III PA and AP mobilization    Quad set x15  5 sec hold  SLR 3x10  SAQ 3x10   Hamstring stretch 3x20 sec hold seated   Standing slow march with left 2x10  Standing heel/toe 3x10    7/8  Manual: PROM into flexion and extension; STM to medial haMstring and gastroc; grade II and III PA and AP mobilization    Quad set x15  5 sec hold  SLR 3x10 mild pain in the back  SAQ 3x10 RPE of 4   Hamstring stretch 3x20 sec hold seated   Standing slow march with left 2x10  Standing heel/toe 3x10        PATIENT EDUCATION:  Education details: HEP, symptom management  Person educated: Patient Education method: Explanation, Demonstration, Tactile cues, Verbal cues, and Handouts Education comprehension: verbalized understanding, returned demonstration, verbal cues required, tactile cues required, and needs further education  HOME EXERCISE PROGRAM: Access Code: [email or web address] URL: [email or web address] Date: 04/17/2023 Prepared by: Lorayne Bender  Exercises - Supine Heel Slide with Strap  - 3 x daily - 7 x weekly - 1 sets - 5-10 reps - 5sec  hold - Supine Knee Extension Stretch on Towel Roll  - 3 x daily - 7 x weekly - 3 sets - 5-10 reps - 5 sec  hold - Supine Quad Set  - 3 x daily - 7 x weekly - 3 sets - 10 reps - 5 sec  hold - Small Range Straight Leg Raise  - 2 x daily - 7 x weekly - 3 sets - 5-10 reps  ASSESSMENT:  CLINICAL  IMPRESSION: Pt remains guarded/cautious when ambulating without SPC. 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 Reviewed technique with squats as well as cues for glute engagement. She does note some  lateral knee pain with squats that did decrease following these cues. Remains challenged by 4" step ups due to knee discomfort. Pt would benefit from continued PT to address continued strength and ROM deficits.    Eval: Patient is a 77 year old female status post right TKA on 04/08/2023.  At this time her pain is well-controlled.  She presents with expected limitations in motion, strength, and functional mobility.  She is using a walker for primary mobility.  She would benefit from skilled therapy to improve her ability to ambulate in the community and to return to gardening and other activities she likes to do.  OBJECTIVE IMPAIRMENTS: Abnormal gait, decreased activity tolerance, decreased knowledge of condition, decreased knowledge of use of DME, decreased mobility, difficulty walking, decreased  ROM, decreased strength, and pain.   ACTIVITY LIMITATIONS: carrying, lifting, bending, sitting, standing, squatting, sleeping, stairs, transfers, bed mobility, bathing, dressing, and locomotion level  PARTICIPATION LIMITATIONS: meal prep, cleaning, laundry, driving, shopping, community activity, and yard work  PERSONAL FACTORS: 1-2 comorbidities: osteoperosis, lupus  are also affecting patient's functional outcome.   REHAB POTENTIAL: Excellent  CLINICAL DECISION MAKING: Stable/uncomplicated  EVALUATION COMPLEXITY: Low   GOALS: Goals reviewed with patient? Yes  SHORT TERM GOALS: Target date: 05/15/2023   Patient will increase passive right knee flexion to 110 degrees  Baseline: Goal status: MET 7/17  2.  Patient will increase right knee extension to 0  Baseline:  Goal status: INITIAL  3.  Patient will increase gross bilateral LE strength by 5 pounds   Baseline:  Goal status: INITIAL   LONG TERM GOALS: Target date: 06/12/2023    Patient will return to complete gym and aquatic strengthening program  Baseline:  Goal status: INITIAL  2.  Patient will go up and down 8 steps with reciprocal pattern  Baseline:  Goal status: INITIAL  3.  Patient will ambulate 1000' without increased pain in order to go to the store  Baseline:  Goal status: INITIAL    PLAN:  PT FREQUENCY: 2x/week  PT DURATION: 8 weeks  PLANNED INTERVENTIONS: Therapeutic exercises, Therapeutic activity, Neuromuscular re-education, Balance training, Gait training, Patient/Family education, Self Ruiz, Joint mobilization, Stair training, Aquatic Therapy, Dry Needling, Cryotherapy, Moist heat, Taping, and Ultrasound  PLAN FOR NEXT SESSION: Continue R knee ROM activities, progress to standing therex, did pt increase icing frequency; consider LAQ and steps       Lenore Manner, PTA 05/15/2023, 11:30 AM  [facility] Outpatient Rehabilitation at [facility] [address] [place], [place], [address] Phone: [phone]   Fax:  [phone]

## 2023-05-17 ENCOUNTER — Ambulatory Visit (HOSPITAL_BASED_OUTPATIENT_CLINIC_OR_DEPARTMENT_OTHER): Payer: Medicare Other | Admitting: Physical Therapy

## 2023-05-17 ENCOUNTER — Encounter (HOSPITAL_BASED_OUTPATIENT_CLINIC_OR_DEPARTMENT_OTHER): Payer: Self-pay | Admitting: Physical Therapy

## 2023-05-17 DIAGNOSIS — R600 Localized edema: Secondary | ICD-10-CM

## 2023-05-17 DIAGNOSIS — R2689 Other abnormalities of gait and mobility: Secondary | ICD-10-CM

## 2023-05-17 DIAGNOSIS — M25561 Pain in right knee: Secondary | ICD-10-CM | POA: Diagnosis not present

## 2023-05-17 DIAGNOSIS — M25661 Stiffness of right knee, not elsewhere classified: Secondary | ICD-10-CM

## 2023-05-17 NOTE — Therapy (Signed)
[facility] Outpatient Rehabilitation at [facility] [address] [place], [place], [address] Phone: [phone]   Fax:  [phone]  Patient Details  Name: [name] MRN: [id number] Date of Birth: December 01, 1945 OUTPATIENT PHYSICAL THERAPY LOWER EXTREMITY TREATMENT/Progress note    Patient Name: [name] MRN: [id number] DOB:1946-07-21, 77 y.o., female Today's Date: 05/17/2023  END OF SESSION:  PT End of Session - 05/17/23 1024     Visit Number 10    Number of Visits 16    Date for PT Re-Evaluation 06/12/23    PT Start Time 1015    PT Stop Time 1058    PT Time Calculation (min) 43 min    Activity Tolerance Patient tolerated treatment well    Behavior During Therapy [facility] for tasks assessed/performed                  Past Medical History:  Diagnosis Date   Allergy    Anxiety    Atherosclerosis    Cancer (HCC)    Basal cell skin cancer   Colon polyp    Compression fracture of L1 lumbar vertebra (HCC)    dx by ortho   Environmental allergies    Hypothyroid    Osteoarthritis    Osteoarthritis    Osteoarthritis    Osteopenia    Plantar fasciitis, right    Raynauds phenomenon    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   Sjogren's syndrome (HCC)    Past Surgical History:  Procedure Laterality Date   APPENDECTOMY     BUNIONECTOMY  2008   right foot   CATARACT EXTRACTION     COLONOSCOPY  12/05/2016   Dr.Jacobs   COLONOSCOPY W/ POLYPECTOMY  08/27/2011   ENDOMETRIAL ABLATION     FOOT SURGERY Right 12/17/2017   bone spur   ingrown toenail Right 12/16/2018   LAPAROSCOPIC APPENDECTOMY  10/11/2011   Procedure: APPENDECTOMY LAPAROSCOPIC;  Surgeon: Ardeth Sportsman, MD;  Location: [facility];  Service: General;  Laterality: N/A;  Partial Cecectomy and Appendectomy   POLYPECTOMY     TOTAL KNEE ARTHROPLASTY Right 04/08/2023   Procedure: TOTAL KNEE ARTHROPLASTY;  Surgeon: Ollen Gross, MD;  Location: [facility];  Service:  Orthopedics;  Laterality: Right;   WISDOM TOOTH EXTRACTION     Patient Active Problem List   Diagnosis Date Noted   Primary osteoarthritis of right knee 04/08/2023   Encounter for counseling 09/09/2018   Osteopenia of multiple sites 08/22/2017   History of vitamin D deficiency 08/22/2017   Autoimmune disease (HCC) 10/23/2016   Sjogren's syndrome (HCC) 10/09/2016   Primary osteoarthritis of both hands 10/09/2016   Primary osteoarthritis of both knees 10/09/2016   High risk medication use 09/12/2016   SBO (small bowel obstruction), partial postop 10/16/2011   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     PCP: Selena Batten MD  REFERRING PROVIDER: Dr Loel Ro   REFERRING DIAG:   M17.11 (ICD-10-CM) - Unilateral primary osteoarthritis, right knee    THERAPY DIAG:  No diagnosis found.  Rationale for Evaluation and Treatment: Rehabilitation  ONSET DATE: 04/08/2023  SUBJECTIVE:   SUBJECTIVE STATEMENT: The patient reports she felt like she overdid it a little.  PERTINENT HISTORY: Osteopenia, L1 compression fx, OA of the knees and lumbar spine, PF and bone sur in the right foot with bone  spur removal, raynaud, lupus, Sjogrens  PAIN:  Are you having pain? Yes: NPRS scale: 4/10 Pain location: medial and central knee Pain description: aching  Aggravating factors: standing and walking  Relieving factors: rest   PRECAUTIONS: None  WEIGHT BEARING RESTRICTIONS: Yes WBAT   FALLS:  Has patient fallen in last 6 months? No  LIVING ENVIRONMENT: 3 steps into the kitchen  OCCUPATION:  Retired   PLOF: Independent  PATIENT GOALS:  To improve mobility   NEXT MD VISIT:  July 3rd   OBJECTIVE:   DIAGNOSTIC FINDINGS:  Nothing post op   PATIENT SURVEYS:  FOTO    COGNITION: Overall cognitive  status: Within functional limits for tasks assessed     SENSATION: Occasionally leg feels like it is going to sleep    EDEMA:  Circumferential: right: 46.5 left 36.5  MUSCLE LENGTH:  POSTURE: No Significant postural limitations  PALPATION: No unexpected tenderness to palpation   LOWER EXTREMITY ROM:  Passive ROM Right eval Left eval Right 7/1 Right 7/17 Right  7/19  Hip flexion       Hip extension       Hip abduction       Hip adduction       Hip internal rotation       Hip external rotation       Knee flexion   96deg AAROM 118deg AROM   Knee extension    6deg AROM -3  Ankle dorsiflexion       Ankle plantarflexion       Ankle inversion       Ankle eversion        (Blank rows = not tested)  LOWER EXTREMITY MMT:  MMT Right eval Left eval Right  Left   Hip flexion   13.9 17.8  Hip extension      Hip abduction   23.4 27.8  Hip adduction      Hip internal rotation      Hip external rotation      Knee flexion 84 93    Knee extension -6 -3 24.2 32.3  Ankle dorsiflexion      Ankle plantarflexion      Ankle inversion      Ankle eversion       (Blank rows = not tested)    GAIT: Limited weight bearing on right; forward trunk flexion onto the walker   TODAY'S TREATMENT:                                                                                                                              DATE:      7/26 Manual: posterior knee trigger point release and extension stretching   SLR 3x10   LAQ 3x10   Heel raise 2x10  Mini squats 2x10   Reviewed tests and measures  Step up 4 inch 1x 10 (had some pain so only did one set)  7/24  Nu-step PROM SLR 3x10  Gait in hall x2 no cane  LAQ 2x10 3 lbs   Mini squats 2x10 (reviewed technique) Heel raises (>RLE) 2x10  Step up 4 inch 1x 10 (had some pain so only did one set)  Tandem balance R foot behind L  30sec x2 Walking marches at rail x2 laps  7/19 Manual: PROM into flexion and  extension,patella mobilizations; trigger point release to gastroc and hamstring  SLR 3x10  SAQ 3x12 1.5 lbs  LAQ 3x10 1.5 lbs   Mini squats 3x10   Step up 4 inch 2 x 10      7/17 Manual: PROM into flexion and extension,patella mobilizations  SLR 3x10    Hamstring stretch 3x30sec long sit  Standing slow march bilateral, min UE support  Standing heel/toe 3x10 Partial squats at rail 2x10  Step ups 4" 2x10  Gait in hall without cane- cues for heel contact and toe off- full hall x2  7/12 Manual: PROM into flexion and extension; STM to medial hamstring and gastroc; grade II and III PA and AP mobilization    Quad set x15  5 sec hold  SLR 3x10  SAQ 3x10   Hamstring stretch 3x20 sec hold seated   Standing slow march with left 2x10  Standing heel/toe 3x10    7/8  Manual: PROM into flexion and extension; STM to medial haMstring and gastroc; grade II and III PA and AP mobilization    Quad set x15  5 sec hold  SLR 3x10 mild pain in the back  SAQ 3x10 RPE of 4   Hamstring stretch 3x20 sec hold seated   Standing slow march with left 2x10  Standing heel/toe 3x10        PATIENT EDUCATION:  Education details: HEP, symptom management  Person educated: Patient Education method: Explanation, Demonstration, Tactile cues, Verbal cues, and Handouts Education comprehension: verbalized understanding, returned demonstration, verbal cues required, tactile cues required, and needs further education  HOME EXERCISE PROGRAM: Access Code: [email or web address] URL: [email or web address] Date: 04/17/2023 Prepared by: Lorayne Bender  Exercises - Supine Heel Slide with Strap  - 3 x daily - 7 x weekly - 1 sets - 5-10 reps - 5sec  hold - Supine Knee Extension Stretch on Towel Roll  - 3 x daily - 7 x weekly - 3 sets - 5-10 reps - 5 sec  hold - Supine Quad Set  - 3 x daily - 7 x weekly - 3 sets - 10 reps - 5 sec  hold - Small Range Straight Leg Raise  - 2 x daily - 7 x weekly - 3  sets - 5-10 reps  ASSESSMENT:  CLINICAL IMPRESSION: The patient has made good progress per test and measures today. She has already surpassed her FOTO goal. Her ROM is progressing very well. Her total arc was measured at 3-120. Her strength has improved but she still has strength deficits. She is also having some difficulty going down steps. She would benefit from further skilled therapy 1-2W6 to improve overall functional mobility.   Eval: Patient is a 77 year old female status post right TKA on 04/08/2023.  At this time her pain is well-controlled.  She presents with expected limitations in motion, strength, and functional mobility.  She is using a walker for primary mobility.  She would benefit from skilled therapy to improve her ability to ambulate in the community and to return to gardening and other activities she likes to do.  OBJECTIVE IMPAIRMENTS: Abnormal gait, decreased activity tolerance, decreased knowledge of condition, decreased knowledge  of use of DME, decreased mobility, difficulty walking, decreased ROM, decreased strength, and pain.   ACTIVITY LIMITATIONS: carrying, lifting, bending, sitting, standing, squatting, sleeping, stairs, transfers, bed mobility, bathing, dressing, and locomotion level  PARTICIPATION LIMITATIONS: meal prep, cleaning, laundry, driving, shopping, community activity, and yard work  PERSONAL FACTORS: 1-2 comorbidities: osteoperosis, lupus  are also affecting patient's functional outcome.   REHAB POTENTIAL: Excellent  CLINICAL DECISION MAKING: Stable/uncomplicated  EVALUATION COMPLEXITY: Low   GOALS: Goals reviewed with patient? Yes  SHORT TERM GOALS: Target date: 05/15/2023   Patient will increase passive right knee flexion to 110 degrees  Baseline: Goal status: MET 7/17  2.  Patient will increase right knee extension to 0  Baseline:  Goal status: INITIAL  3.  Patient will increase gross bilateral LE strength by 5 pounds  Baseline:  Goal  status: INITIAL   LONG TERM GOALS: Target date: 06/12/2023    Patient will return to complete gym and aquatic strengthening program  Baseline:  Goal status: INITIAL  2.  Patient will go up and down 8 steps with reciprocal pattern  Baseline:  Goal status: INITIAL  3.  Patient will ambulate 1000' without increased pain in order to go to the store  Baseline:  Goal status: INITIAL    PLAN:  PT FREQUENCY: 2x/week  PT DURATION: 8 weeks  PLANNED INTERVENTIONS: Therapeutic exercises, Therapeutic activity, Neuromuscular re-education, Balance training, Gait training, Patient/Family education, Self Care, Joint mobilization, Stair training, Aquatic Therapy, Dry Needling, Cryotherapy, Moist heat, Taping, and Ultrasound  PLAN FOR NEXT SESSION: Continue R knee ROM activities, progress to standing therex, did pt increase icing frequency; consider LAQ and steps       Dessie Coma, PT 05/17/2023, 10:25 AM  [facility] Outpatient Rehabilitation at [facility] [address] [place], [place], [address] Phone: [phone]   Fax:  [phone]

## 2023-05-23 DIAGNOSIS — N94819 Vulvodynia, unspecified: Secondary | ICD-10-CM

## 2023-05-23 HISTORY — DX: Vulvodynia, unspecified: N94.819

## 2023-05-28 ENCOUNTER — Encounter (HOSPITAL_BASED_OUTPATIENT_CLINIC_OR_DEPARTMENT_OTHER): Payer: Self-pay

## 2023-05-28 ENCOUNTER — Ambulatory Visit (HOSPITAL_BASED_OUTPATIENT_CLINIC_OR_DEPARTMENT_OTHER): Payer: Medicare Other | Attending: Family Medicine

## 2023-05-28 DIAGNOSIS — M25561 Pain in right knee: Secondary | ICD-10-CM | POA: Diagnosis present

## 2023-05-28 DIAGNOSIS — R2689 Other abnormalities of gait and mobility: Secondary | ICD-10-CM | POA: Insufficient documentation

## 2023-05-28 DIAGNOSIS — R600 Localized edema: Secondary | ICD-10-CM | POA: Insufficient documentation

## 2023-05-28 DIAGNOSIS — M25661 Stiffness of right knee, not elsewhere classified: Secondary | ICD-10-CM | POA: Insufficient documentation

## 2023-05-28 NOTE — Therapy (Signed)
[facility] Outpatient Rehabilitation at [facility] [address] [place], [place], [address] Phone: [phone]   Fax:  [phone]  Patient Details  Name: [name] MRN: [id number] Date of Birth: 04/19/1946 OUTPATIENT PHYSICAL THERAPY LOWER EXTREMITY TREATMENT   Patient Name: [name] MRN: [id number] DOB:03-24-1946, 77 y.o., female Today's Date: 05/28/2023  END OF SESSION:  PT End of Session - 05/28/23 1552     Visit Number 11    Number of Visits 16    Date for PT Re-Evaluation 06/28/23    Authorization Type progress note visit 19    Progress Note Due on Visit 19    PT Start Time 1348    PT Stop Time 1430    PT Time Calculation (min) 42 min    Equipment Utilized During Treatment Gait belt    Activity Tolerance Patient tolerated treatment well    Behavior During Therapy WFL for tasks assessed/performed                   Past Medical History:  Diagnosis Date   Allergy    Anxiety    Atherosclerosis    Cancer (HCC)    Basal cell skin cancer   Colon polyp    Compression fracture of L1 lumbar vertebra (HCC)    dx by ortho   Environmental allergies    Hypothyroid    Osteoarthritis    Osteoarthritis    Osteoarthritis    Osteopenia    Plantar fasciitis, right    Raynauds phenomenon    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   Sjogren's syndrome (HCC)    Past Surgical History:  Procedure Laterality Date   APPENDECTOMY     BUNIONECTOMY  2008   right foot   CATARACT EXTRACTION     COLONOSCOPY  12/05/2016   Dr.Jacobs   COLONOSCOPY W/ POLYPECTOMY  08/27/2011   ENDOMETRIAL ABLATION     FOOT SURGERY Right 12/17/2017   bone spur   ingrown toenail Right 12/16/2018   LAPAROSCOPIC APPENDECTOMY  10/11/2011   Procedure: APPENDECTOMY LAPAROSCOPIC;  Surgeon: Ardeth Sportsman, MD;  Location: [facility];  Service: General;  Laterality: N/A;  Partial Cecectomy and Appendectomy   POLYPECTOMY     TOTAL KNEE  ARTHROPLASTY Right 04/08/2023   Procedure: TOTAL KNEE ARTHROPLASTY;  Surgeon: Ollen Gross, MD;  Location: [facility];  Service: Orthopedics;  Laterality: Right;   WISDOM TOOTH EXTRACTION     Patient Active Problem List   Diagnosis Date Noted   Primary osteoarthritis of right knee 04/08/2023   Encounter for counseling 09/09/2018   Osteopenia of multiple sites 08/22/2017   History of vitamin D deficiency 08/22/2017   Autoimmune disease (HCC) 10/23/2016   Sjogren's syndrome (HCC) 10/09/2016   Primary osteoarthritis of both hands 10/09/2016   Primary osteoarthritis of both knees 10/09/2016   High risk medication use 09/12/2016   SBO (small bowel obstruction), partial postop 10/16/2011   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 Was able to  climb stairs in garage reciprocally with use of railing.  PERTINENT HISTORY: Osteopenia, L1 compression fx, OA of the knees and lumbar spine, PF and bone sur in the right foot with bone spur removal, raynaud, lupus, Sjogrens  PAIN:  Are you having pain? No PRECAUTIONS: None  WEIGHT BEARING RESTRICTIONS: Yes WBAT   FALLS:  Has patient fallen in last 6 months? No  LIVING ENVIRONMENT: 3 steps into the kitchen  OCCUPATION:  Retired   PLOF: Independent  PATIENT GOALS:  To improve mobility   NEXT MD VISIT:  July 3rd   OBJECTIVE:   DIAGNOSTIC FINDINGS:  Nothing post op   PATIENT SURVEYS:  FOTO    COGNITION: Overall cognitive  status: Within functional limits for tasks assessed     SENSATION: Occasionally leg feels like it is going to sleep    EDEMA:  Circumferential: right: 46.5 left 36.5  MUSCLE LENGTH:  POSTURE: No Significant postural limitations  PALPATION: No unexpected tenderness to palpation   LOWER EXTREMITY ROM:  Passive ROM Right eval Left eval Right 7/1 Right 7/17 Right  7/19  Hip flexion       Hip extension       Hip abduction       Hip adduction       Hip internal rotation       Hip external rotation       Knee flexion   96deg AAROM 118deg AROM   Knee extension    6deg AROM -3  Ankle dorsiflexion       Ankle plantarflexion       Ankle inversion       Ankle eversion        (Blank rows = not tested)  LOWER EXTREMITY MMT:  MMT Right eval Left eval Right  Left   Hip flexion   13.9 17.8  Hip extension      Hip abduction   23.4 27.8  Hip adduction      Hip internal rotation      Hip external rotation      Knee flexion 84 93    Knee extension -6 -3 24.2 32.3  Ankle dorsiflexion      Ankle plantarflexion      Ankle inversion      Ankle eversion       (Blank rows = not tested)    GAIT: Limited weight bearing on right; forward trunk flexion onto the walker   TODAY'S TREATMENT:                                                                                                                              DATE:    8/6 PROM R knee; Extension mobilizations   Stair climbing 1/2 flight recipircal up/down with single rail  SLR 2x10  LAQ 3lbs 2x10 5" hold  Squats 2x10   Ant/post weight shifts on airex Marching on airex 2x10 Walking tandem along rail CGA with gait belt Walking marches  along rail (SBA) Gait with head turns/nods in hall  7/26 Manual: posterior knee trigger point release and extension stretching   SLR 3x10   LAQ 3x10   Heel raise 2x10  Mini squats 2x10   Reviewed tests and measures  Step up 4 inch 1x 10 (had some pain so only did one  set)  7/24  Nu-step PROM SLR 3x10   Gait in hall x2 no cane  LAQ 2x10 3 lbs   Mini squats 2x10 (reviewed technique) Heel raises (>RLE) 2x10  Step up 4 inch 1x 10 (had some pain so only did one set)  Tandem balance R foot behind L  30sec x2 Walking marches at rail x2 laps  7/19 Manual: PROM into flexion and extension,patella mobilizations; trigger point release to gastroc and hamstring  SLR 3x10  SAQ 3x12 1.5 lbs  LAQ 3x10 1.5 lbs   Mini squats 3x10   Step up 4 inch 2 x 10      7/17 Manual: PROM into flexion and extension,patella mobilizations  SLR 3x10    Hamstring stretch 3x30sec long sit  Standing slow march bilateral, min UE support  Standing heel/toe 3x10 Partial squats at rail 2x10  Step ups 4" 2x10  Gait in hall without cane- cues for heel contact and toe off- full hall x2  7/12 Manual: PROM into flexion and extension; STM to medial hamstring and gastroc; grade II and III PA and AP mobilization    Quad set x15  5 sec hold  SLR 3x10  SAQ 3x10   Hamstring stretch 3x20 sec hold seated   Standing slow march with left 2x10  Standing heel/toe 3x10    7/8  Manual: PROM into flexion and extension; STM to medial haMstring and gastroc; grade II and III PA and AP mobilization    Quad set x15  5 sec hold  SLR 3x10 mild pain in the back  SAQ 3x10 RPE of 4   Hamstring stretch 3x20 sec hold seated   Standing slow march with left 2x10  Standing heel/toe 3x10        PATIENT EDUCATION:  Education details: HEP, symptom management  Person educated: Patient Education method: Explanation, Demonstration, Tactile cues, Verbal cues, and Handouts Education comprehension: verbalized understanding, returned demonstration, verbal cues required, tactile cues required, and needs further education  HOME EXERCISE PROGRAM: Access Code: [email or web address] URL: [email or web address] Date: 04/17/2023 Prepared by: Lorayne Bender  Exercises - Supine Heel  Slide with Strap  - 3 x daily - 7 x weekly - 1 sets - 5-10 reps - 5sec  hold - Supine Knee Extension Stretch on Towel Roll  - 3 x daily - 7 x weekly - 3 sets - 5-10 reps - 5 sec  hold - Supine Quad Set  - 3 x daily - 7 x weekly - 3 sets - 10 reps - 5 sec  hold - Small Range Straight Leg Raise  - 2 x daily - 7 x weekly - 3 sets - 5-10 reps  ASSESSMENT:  CLINICAL IMPRESSION: Performed more intense exercises earlier in the session vs later, with pt demonstrating improved stamina. She was able to climb 1/2 flight of stairs in clinic reciprocally with single UE support and SBA. Again worked on balance challenges to improve pt's stability and confidence. Added lunges to work towards kneeling motion, as pt reports she had an instance of buckling in knee (mild) when reaching for the floor to pick something at home recently. Encouraged pt to try going to a  small grocery store with her husband to see how she manages before she tries a large store.  Eval: Patient is a 77 year old female status post right TKA on 04/08/2023.  At this time her pain is well-controlled.  She presents with expected limitations in motion, strength, and functional mobility.  She is using a walker for primary mobility.  She would benefit from skilled therapy to improve her ability to ambulate in the community and to return to gardening and other activities she likes to do.  OBJECTIVE IMPAIRMENTS: Abnormal gait, decreased activity tolerance, decreased knowledge of condition, decreased knowledge of use of DME, decreased mobility, difficulty walking, decreased ROM, decreased strength, and pain.   ACTIVITY LIMITATIONS: carrying, lifting, bending, sitting, standing, squatting, sleeping, stairs, transfers, bed mobility, bathing, dressing, and locomotion level  PARTICIPATION LIMITATIONS: meal prep, cleaning, laundry, driving, shopping, community activity, and yard work  PERSONAL FACTORS: 1-2 comorbidities: osteoperosis, lupus  are also  affecting patient's functional outcome.   REHAB POTENTIAL: Excellent  CLINICAL DECISION MAKING: Stable/uncomplicated  EVALUATION COMPLEXITY: Low   GOALS: Goals reviewed with patient? Yes  SHORT TERM GOALS: Target date: 05/15/2023   Patient will increase passive right knee flexion to 110 degrees  Baseline: Goal status: MET 7/17  2.  Patient will increase right knee extension to 0  Baseline:  Goal status: INITIAL  3.  Patient will increase gross bilateral LE strength by 5 pounds  Baseline:  Goal status: INITIAL   LONG TERM GOALS: Target date: 06/12/2023    Patient will return to complete gym and aquatic strengthening program  Baseline:  Goal status: INITIAL  2.  Patient will go up and down 8 steps with reciprocal pattern  Baseline:  Goal status: INITIAL  3.  Patient will ambulate 1000' without increased pain in order to go to the store  Baseline:  Goal status: INITIAL    PLAN:  PT FREQUENCY: 2x/week  PT DURATION: 8 weeks  PLANNED INTERVENTIONS: Therapeutic exercises, Therapeutic activity, Neuromuscular re-education, Balance training, Gait training, Patient/Family education, Self Care, Joint mobilization, Stair training, Aquatic Therapy, Dry Needling, Cryotherapy, Moist heat, Taping, and Ultrasound  PLAN FOR NEXT SESSION: Continue R knee ROM activities, progress to standing therex, did pt increase icing frequency; consider LAQ and steps       Lenore Manner, PTA 05/28/2023, 4:13 PM  [facility] Outpatient Rehabilitation at [facility] [address] [place], [place], [address] Phone: [phone]   Fax:  [phone]

## 2023-05-30 ENCOUNTER — Encounter (HOSPITAL_BASED_OUTPATIENT_CLINIC_OR_DEPARTMENT_OTHER): Payer: Self-pay

## 2023-05-30 ENCOUNTER — Ambulatory Visit (HOSPITAL_BASED_OUTPATIENT_CLINIC_OR_DEPARTMENT_OTHER): Payer: Medicare Other

## 2023-06-03 ENCOUNTER — Ambulatory Visit (HOSPITAL_BASED_OUTPATIENT_CLINIC_OR_DEPARTMENT_OTHER): Payer: Medicare Other

## 2023-06-03 ENCOUNTER — Encounter (HOSPITAL_BASED_OUTPATIENT_CLINIC_OR_DEPARTMENT_OTHER): Payer: Self-pay

## 2023-06-03 DIAGNOSIS — R2689 Other abnormalities of gait and mobility: Secondary | ICD-10-CM

## 2023-06-03 DIAGNOSIS — M25661 Stiffness of right knee, not elsewhere classified: Secondary | ICD-10-CM

## 2023-06-03 DIAGNOSIS — R600 Localized edema: Secondary | ICD-10-CM

## 2023-06-03 DIAGNOSIS — M25561 Pain in right knee: Secondary | ICD-10-CM

## 2023-06-03 NOTE — Therapy (Addendum)
 [facility] at [facility] [address] [place], [place], [address] Phone: [phone]   Fax:  [phone]  Patient Details  Name: [name] MRN: [id number] Date of Birth: Sep 10, 1946 OUTPATIENT PHYSICAL THERAPY LOWER EXTREMITY TREATMENT/Discharge   PHYSICAL THERAPY DISCHARGE SUMMARY  Visits from Start of Care: 12  Current functional level related to goals / functional outcomes: indep   Remaining deficits: none   Education / Equipment: Management of condition/HEP   Patient agrees to discharge. Patient goals were partially met. Patient is being discharged due to being pleased with the current functional level.  Addend Corrie Dandy Tomma Lightning) Ziemba MPT 12/12/23 12:50 PM [facility] [address] [place], [place], [address] Phone: [phone]   Fax:  [phone]   Patient Name: [name] MRN: [id number] DOB:[birth year], 77 y.o., female Today's Date: 06/03/2023  END OF SESSION:  PT End of Session - 06/03/23 1617     Visit Number 12    Number of Visits 16    Date for PT Re-Evaluation 06/28/23    Authorization Type progress note visit 19    Progress Note Due on Visit 19    PT Start Time 1600    PT Stop Time 1639    PT Time Calculation (min) 39 min    Activity Tolerance Patient tolerated treatment well    Behavior During Therapy WFL for tasks assessed/performed                    Past Medical History:  Diagnosis Date   Allergy    Anxiety    Atherosclerosis    Cancer (HCC)    Basal cell skin cancer   Colon polyp    Compression fracture of L1 lumbar vertebra (HCC)    dx by ortho   Environmental allergies    Hypothyroid    Osteoarthritis    Osteoarthritis    Osteoarthritis    Osteopenia    Plantar fasciitis, right    Raynauds phenomenon    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   Sjogren's syndrome (HCC)    Past Surgical  History:  Procedure Laterality Date   APPENDECTOMY     BUNIONECTOMY  2008   right foot   CATARACT EXTRACTION     COLONOSCOPY  12/05/2016   Dr.Jacobs   COLONOSCOPY W/ POLYPECTOMY  08/27/2011   ENDOMETRIAL ABLATION     FOOT SURGERY Right 12/17/2017   bone spur   ingrown toenail Right 12/16/2018   LAPAROSCOPIC APPENDECTOMY  10/11/2011   Procedure: APPENDECTOMY LAPAROSCOPIC;  Surgeon: Ardeth Sportsman, MD;  Location: [facility];  Service: General;  Laterality: N/A;  Partial Cecectomy and Appendectomy   POLYPECTOMY     TOTAL KNEE ARTHROPLASTY Right 04/08/2023   Procedure: TOTAL KNEE ARTHROPLASTY;  Surgeon: Ollen Gross, MD;  Location: [facility];  Service: Orthopedics;  Laterality: Right;   WISDOM TOOTH EXTRACTION     Patient Active Problem List   Diagnosis Date Noted   Primary osteoarthritis of right knee 04/08/2023   Encounter for counseling 09/09/2018   Osteopenia of multiple sites 08/22/2017   History of vitamin D deficiency 08/22/2017   Autoimmune disease (HCC) 10/23/2016   Sjogren's syndrome (HCC) 10/09/2016   Primary osteoarthritis of both hands 10/09/2016   Primary osteoarthritis of both knees 10/09/2016   High risk medication use 09/12/2016   SBO (small bowel obstruction), partial postop 10/16/2011   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 "I've been working on walking without it. I went to church and [employer] and did okay."  PERTINENT HISTORY: Osteopenia, L1  compression fx, OA of the knees and lumbar spine, PF and bone sur in the right foot with bone spur removal, raynaud, lupus, Sjogrens  PAIN:  Are you having pain? No PRECAUTIONS: None  WEIGHT BEARING RESTRICTIONS: Yes WBAT   FALLS:  Has patient fallen in last 6 months? No  LIVING ENVIRONMENT: 3 steps into the kitchen  OCCUPATION:  Retired   PLOF: Independent  PATIENT GOALS:  To improve mobility   NEXT MD VISIT:  July 3rd   OBJECTIVE:   DIAGNOSTIC FINDINGS:  Nothing post op   PATIENT SURVEYS:  FOTO   FOTO: 78% (MET GOAL) on 8/12 COGNITION: Overall cognitive status: Within functional limits for tasks assessed     SENSATION: Occasionally leg feels like it is going to sleep    EDEMA:  Circumferential: right: 46.5 left 36.5 42.5cm R knee on 8/12 MUSCLE LENGTH:  POSTURE: No Significant postural limitations  PALPATION: No unexpected tenderness to palpation   LOWER EXTREMITY ROM:  Passive ROM Right eval Left eval Right 7/1 Right 7/17 Right  7/19 Right  8/12  Hip flexion        Hip extension        Hip abduction        Hip adduction        Hip internal rotation        Hip external rotation        Knee flexion   96deg AAROM 118deg AROM  126deg AROM  Knee extension    6deg AROM -3 3deg AROM   Ankle dorsiflexion        Ankle plantarflexion        Ankle inversion        Ankle eversion         (Blank rows = not tested)  LOWER EXTREMITY MMT:  MMT Right eval Left eval Right  Left  Right   Hip flexion   13.9 17.8 Held due to pt request (painful last time)  Hip extension       Hip abduction   23.4 27.8 30.1  Hip adduction       Hip internal rotation       Hip external rotation       Knee flexion 84 93     Knee extension -6 -3 24.2 32.3 34.7  Ankle dorsiflexion       Ankle plantarflexion       Ankle inversion       Ankle eversion        (Blank rows = not tested)    GAIT: Limited weight bearing on right; forward trunk flexion onto the walker    TODAY'S TREATMENT:  DATE:    8/12 Updated ROM and strength Reviewed goals HEP update and review  Stair climbing full flight recipircal up/down with single rail Partial squats at rail 2x10 Standing hip 3way bil 2# 2x10ea Walking marches along rail (SBA)   PATIENT EDUCATION:  Education details: HEP, symptom management  Person educated: Patient Education method: Explanation, Demonstration, Tactile cues, Verbal cues, and Handouts Education comprehension: verbalized understanding, returned demonstration, verbal cues required, tactile cues required, and needs further education  HOME EXERCISE PROGRAM: Access Code: [email or web address] URL: [email or web address] Date: 04/17/2023 Prepared by: Lorayne Bender Updated:8/12   ASSESSMENT:  CLINICAL IMPRESSION: Pt has attended 12 visits of PT thus far and has made excellent progress. She has met most goals.  Has met Foto goal at 78%. Plans to return to exercise program at gym under guidance of personal training here at [facility]: Lambert Rhonda W. She reports 98% improvement since IE.  Denies difficulty with ADLs.  Demonstrates safe stair navigation in clinic.  Patient is appropriate for DC at this time due to progress towards goals.  See objective section for updated measurements.  Updated and reviewed HEP with patient with good understanding.    PERSONAL FACTORS: 1-2 comorbidities: osteoperosis, lupus  are also affecting patient's functional outcome.   REHAB POTENTIAL: Excellent  CLINICAL DECISION MAKING: Stable/uncomplicated  EVALUATION COMPLEXITY: Low   GOALS: Goals reviewed with patient? Yes  SHORT TERM GOALS: Target date: 05/15/2023   Patient will increase passive right knee flexion to 110 degrees  Baseline: Goal status: MET 7/17  2.  Patient will increase right knee extension to 0  Baseline:  Goal status:  ONGOING 8/12   3.  Patient will increase gross bilateral LE strength by 5 pounds  Baseline:  Goal status: MET 8/12   LONG TERM GOALS: Target date: 06/12/2023    Patient will return to complete gym and aquatic strengthening program  Baseline:  Goal status: ONGOING 8/12 (plans to schedule appt with trainer to create strengthening program)  2.  Patient will go up and down 8 steps with reciprocal pattern  Baseline:  Goal status: MET 8/12  3.  Patient will ambulate 1000' without increased pain in order to go to the store  Baseline:  Goal status: MET 8/12    PLAN:  PT FREQUENCY: 2x/week  PT DURATION: 8 weeks  PLANNED INTERVENTIONS: Therapeutic exercises, Therapeutic activity, Neuromuscular re-education, Balance training, Gait training, Patient/Family education, Self Care, Joint mobilization, Stair training, Aquatic Therapy, Dry Needling, Cryotherapy, Moist heat, Taping, and Ultrasound  PLAN FOR NEXT SESSION:  Discharged to HEP.   Therapist reviewed discharge and agrees with current plan. D/C to HEP at this time   Lorayne Bender PT DPT 06/25/2023  Lenore Manner, PTA 06/03/2023, 4:55 PM  [facility] Outpatient Rehabilitation at [facility] [address] [place], [place], [address] Phone: [phone]   Fax:  [phone]

## 2023-06-07 ENCOUNTER — Ambulatory Visit (HOSPITAL_BASED_OUTPATIENT_CLINIC_OR_DEPARTMENT_OTHER): Payer: Medicare Other

## 2023-06-11 ENCOUNTER — Encounter (HOSPITAL_BASED_OUTPATIENT_CLINIC_OR_DEPARTMENT_OTHER): Payer: Medicare Other | Admitting: Physical Therapy

## 2023-06-13 ENCOUNTER — Encounter (HOSPITAL_BASED_OUTPATIENT_CLINIC_OR_DEPARTMENT_OTHER): Payer: Medicare Other | Admitting: Physical Therapy

## 2023-06-20 ENCOUNTER — Other Ambulatory Visit: Payer: Self-pay | Admitting: Physician Assistant

## 2023-06-20 DIAGNOSIS — M3501 Sicca syndrome with keratoconjunctivitis: Secondary | ICD-10-CM

## 2023-06-20 DIAGNOSIS — Z79899 Other long term (current) drug therapy: Secondary | ICD-10-CM

## 2023-06-20 DIAGNOSIS — M359 Systemic involvement of connective tissue, unspecified: Secondary | ICD-10-CM

## 2023-06-21 MED ORDER — ESCITALOPRAM OXALATE 10 MG PO TABS: 10.0000 mg | ORAL_TABLET | Freq: Every day | ORAL | 0 refills | Status: DC

## 2023-06-21 NOTE — Telephone Encounter (Signed)
Last Fill: 12/19/2022  Next Visit: 07/16/2023  Last Visit: 01/22/2023  Dx: History of anxiety   Current Dose per office note on 01/22/2023: not discussed  Okay to refill Lexapro?

## 2023-07-03 ENCOUNTER — Ambulatory Visit: Payer: Medicare Other | Admitting: Rheumatology

## 2023-07-04 NOTE — Progress Notes (Signed)
Office Visit Note  Patient: [name]             Date of Birth: June 29, 1946           MRN: [id number]             PCP: Gweneth Dimitri, MD Referring: Gweneth Dimitri, MD Visit Date: 07/16/2023 Occupation: @GUAROCC @  Subjective:  Dry mouth and dry eyes  History of Present Illness: [name] is a 77 y.o. female with Sjogren's, osteoarthritis.  She underwent a right total knee replacement in June 2024 by Dr.Alusio.  She had a good result from the surgery.  She states her left trochanteric bursitis not as bothersome.  She had good response to viscosupplement injections to her left knee joint.  She continues to have some pain and discomfort in her right hand especially the right [facility] joint for which she has been using CMC brace.  She has been getting Prolia injections through her PCP every 6 months.  She continues to take vitamin D and calcium.  She has dry mouth and dry eye symptoms which are manageable with over-the-counter products.  She is on hydroxychloroquine 200 mg daily.any interruption in treatment.  Raynauds symptoms are manageable.  She denies any joint swelling today.  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    Activities of Daily Living:  Patient reports morning stiffness for 0 minutes.   Patient Reports nocturnal pain.  Difficulty dressing/grooming: Denies Difficulty climbing stairs: Denies Difficulty getting out of chair: Denies Difficulty using hands for taps, buttons, cutlery, and/or writing: Denies  Review of Systems  Constitutional:  Positive for fatigue.  HENT:  Positive for mouth dryness. Negative for mouth sores.   Eyes:  Positive for dryness.  Respiratory:  Negative for shortness of breath.   Cardiovascular:  Negative for chest pain and palpitations.  Gastrointestinal:  Negative for blood in stool, constipation and diarrhea.  Endocrine: Negative for increased urination.  Genitourinary:  Negative for involuntary urination.   Musculoskeletal:  Positive for joint pain, joint pain, myalgias, muscle tenderness and myalgias. Negative for gait problem, joint swelling, muscle weakness and morning stiffness.  Skin:  Positive for sensitivity to sunlight. Negative for color change, rash and hair loss.  Allergic/Immunologic: Negative for susceptible to infections.  Neurological:  Negative for dizziness and headaches.  Hematological:  Negative for swollen glands.  Psychiatric/Behavioral:  Positive for sleep disturbance. Negative for depressed mood. The patient is not nervous/anxious.     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/20/2009    Past Medical History:  Diagnosis Date   Allergy    Anxiety    Atherosclerosis    Cancer (HCC)    Basal cell skin cancer   Colon polyp    Compression fracture of L1 lumbar vertebra (HCC)    dx by ortho   Environmental allergies    Hypothyroid    Osteoarthritis    Osteoarthritis    Osteoarthritis  Osteopenia    Plantar fasciitis, right    Raynauds phenomenon    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   Sjogren's syndrome (HCC)    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    Family History  Problem Relation Age of Onset   Heart disease Mother    Psoriasis Father    Arthritis Father    Heart disease Brother    Heart attack Brother    Diabetes Maternal Grandfather    Cancer Maternal Uncle        colon   Colon cancer  Maternal Uncle 68   Asthma Sister    Breast cancer Neg Hx    Past Surgical History:  Procedure Laterality Date   APPENDECTOMY     BUNIONECTOMY  2008   right foot   CATARACT EXTRACTION     COLONOSCOPY  12/05/2016   Dr.Jacobs   COLONOSCOPY W/ POLYPECTOMY  08/27/2011   ENDOMETRIAL ABLATION     FOOT SURGERY Right 12/17/2017   bone spur   ingrown toenail Right 12/16/2018   LAPAROSCOPIC APPENDECTOMY  10/11/2011   Procedure: APPENDECTOMY LAPAROSCOPIC;  Surgeon: Ardeth Sportsman, MD;  Location: [facility];  Service: General;  Laterality: N/A;  Partial Cecectomy and Appendectomy   POLYPECTOMY     TOTAL KNEE ARTHROPLASTY Right 04/08/2023   Procedure: TOTAL KNEE ARTHROPLASTY;  Surgeon: Ollen Gross, MD;  Location: [facility];  Service: Orthopedics;  Laterality: Right;   WISDOM TOOTH EXTRACTION     Social History   Social History Narrative   Not on file   Immunization History  Administered Date(s) Administered   DTaP 06/16/2021   Influenza, High Dose Seasonal PF 07/05/2018, 06/18/2019, 07/11/2022   Influenza,inj,quad, With Preservative 07/10/2017   Influenza-Unspecified 07/23/2020, 06/26/2021, 07/12/2021, 06/10/2023   PFIZER(Purple Top)SARS-COV-2 Vaccination 11/16/2019, 12/07/2019, 06/14/2020, 05/16/2021   Pfizer Covid-19 Vaccine Bivalent Booster 38yrs & up 07/27/2022   Pneumococcal Conjugate-13 09/08/2014   Pneumococcal Polysaccharide-23 08/30/2011   RSV,unspecified 10/31/2022   Tdap 06/20/2011, 06/16/2021   Zoster Recombinant(Shingrix) 02/05/2017, 05/08/2017   Zoster, Live 10/11/2007     Objective: Vital Signs: BP 103/68 (BP Location: Left Arm, Patient Position: Sitting, Cuff Size: Normal)   Pulse 80   Resp 14   Ht 5\' 5"  (1.651 m)   Wt 148 lb 12.8 oz (67.5 kg)   BMI 24.76 kg/m    Physical Exam Vitals and nursing note reviewed.  Constitutional:      Appearance: She is well-developed.  HENT:     Head: Normocephalic and atraumatic.  Eyes:     Conjunctiva/sclera: Conjunctivae  normal.  Cardiovascular:     Rate and Rhythm: Normal rate and regular rhythm.     Heart sounds: Normal heart sounds.  Pulmonary:     Effort: Pulmonary effort is normal.     Breath sounds: Normal breath sounds.  Abdominal:     General: Bowel sounds are normal.     Palpations: Abdomen is soft.  Musculoskeletal:     Cervical back: Normal range of motion.  Lymphadenopathy:     Cervical: No cervical adenopathy.  Skin:    General: Skin is warm and dry.     Capillary Refill: Capillary refill takes less than 2 seconds.  Neurological:     Mental Status: She is alert and oriented to person, place, and time.  Psychiatric:        Behavior: Behavior normal.      Musculoskeletal Exam: Cervical spine was in good range of motion.  Mild thoracic kyphosis was noted.  There was no tenderness over  thoracic or lumbar region.  Shoulders, elbows, wrist joints were in good range of motion.  She had bilateral CMC PIP and DIP thickening with mild tenderness over right CMC joints.  Hip joints were in good range of motion.  Right knee joint was replaced and warm to touch.  She had limited extension of her right knee joint.  Left knee joint was in good range of motion without any warmth swelling or effusion.  There was no tenderness over ankles or MTPs.  CDAI Exam: CDAI Score: -- Patient Global: --; Provider Global: -- Swollen: --; Tender: -- Joint Exam 07/16/2023   No joint exam has been documented for this visit   There is currently no information documented on the homunculus. Go to the Rheumatology activity and complete the homunculus joint exam.  Investigation: No additional findings.  Imaging: No results found.  Recent Labs: Lab Results  Component Value Date   WBC 13.7 (H) 04/09/2023   HGB 11.2 (L) 04/09/2023   PLT 179 04/09/2023   NA 139 04/09/2023   K 4.0 04/09/2023   CL 110 04/09/2023   CO2 24 04/09/2023   GLUCOSE 135 (H) 04/09/2023   BUN 13 04/09/2023   CREATININE 0.58 04/09/2023    BILITOT 0.4 01/15/2023   ALKPHOS 48 02/11/2017   AST 18 01/15/2023   ALT 10 01/15/2023   PROT 6.7 01/15/2023   ALBUMIN 3.9 02/11/2017   CALCIUM 8.1 (L) 04/09/2023   GFRAA 92 01/18/2021    Speciality Comments: PLQ eye exam: 10/29/2022 Normal. Dr. Jethro Bolus. Follow up in 1 year. (in CareEverywhere)  PLQ eye exam scheduled for 09/23/2023  Procedures:  No procedures performed Allergies: Boniva [ibandronic acid], Chlorhexidine gluconate, Latex, Sulfonamide derivatives, and Bacitracin-polymyxin b   Assessment / Plan:     Visit Diagnoses: Sjogren's syndrome with keratoconjunctivitis sicca (HCC) - ANA+, Ro+, sicca symptoms, Raynaud's, inflammatory arthritis: -Patient continues to have dry mouth and dry eye symptoms.  She states her symptoms are manageable with over-the-counter products.  She is also having increased Raynaud's symptoms with the weather change.  She denies any history of digital ulcers.  No sclerodactyly or telangiectasia were noted.  I will obtain labs today.  Plan: Protein / creatinine ratio, urine, Anti-DNA antibody, double-stranded, C3 and C4, Sedimentation rate, Serum protein electrophoresis with reflex  High risk medication use - Plaquenil 200 mg 1 tablet by mouth daily. PLQ eye exam: 10/29/2022 -Labs obtained on April 09, 2023 after surgery showed lymphocytosis and hypocalcemia.  Plan: CBC with Differential/Platelet, COMPLETE METABOLIC PANEL WITH GFR.  Information on immunization was placed in the AVS.  Primary osteoarthritis of both hands-she has some discomfort in the right CMC joint.  She has been using CMC brace.  She continues to have some stiffness in her hands.  A handout on hand exercises was given.  Trochanteric bursitis of left hip-trochanteric bursitis symptoms improved since she had right knee joint replacement.  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 Despina Hick.  She had very good results from right knee joint replacement.  Primary  osteoarthritis of both knees - Visco for both knees December 2023.  She had good response to viscosupplement injections.  Postural kyphosis of thoracic region  DDD (degenerative disc disease), lumbar-she has intermittent discomfort in the lumbar spine.  Osteopenia of multiple sites - DEXA on 05/03/21: The BMD measured at Femur Neck Right is 0.759 g/cm2 with a T-score of -2.0. Prolia 60 mg subcutaneous injections every 6 months--ordered by PCP.  Repeat  DEXA scan is pending.  Closed compression fracture of body of L1 vertebra (HCC)  Vitamin D deficiency -she is taking vitamin D supplement.  Plan: VITAMIN D 25 Hydroxy (Vit-D Deficiency, Fractures)  Other fatigue  History of anxiety  History of depression  Hypothyroidism, adult  Vulvodynia-recent diet.  She has been using topical agents.  Orders: Orders Placed This Encounter  Procedures   Protein / creatinine ratio, urine   CBC with Differential/Platelet   COMPLETE METABOLIC PANEL WITH GFR   Anti-DNA antibody, double-stranded   C3 and C4   Sedimentation rate   Serum protein electrophoresis with reflex   VITAMIN D 25 Hydroxy (Vit-D Deficiency, Fractures)   No orders of the defined types were placed in this encounter.    Follow-Up Instructions: Return in about 5 months (around 12/16/2023) for Sjogren's, Osteoarthritis.   Pollyann Savoy, MD  Note - This record has been created using Animal nutritionist.  Chart creation errors have been sought, but may not always  have been located. Such creation errors do not reflect on  the standard of medical care.

## 2023-07-16 ENCOUNTER — Encounter: Payer: Self-pay | Admitting: Rheumatology

## 2023-07-16 ENCOUNTER — Ambulatory Visit: Payer: Medicare Other | Attending: Family Medicine | Admitting: Rheumatology

## 2023-07-16 VITALS — BP 103/68 | HR 80 | Resp 14 | Ht 65.0 in | Wt 148.8 lb

## 2023-07-16 DIAGNOSIS — M19041 Primary osteoarthritis, right hand: Secondary | ICD-10-CM

## 2023-07-16 DIAGNOSIS — M19042 Primary osteoarthritis, left hand: Secondary | ICD-10-CM | POA: Insufficient documentation

## 2023-07-16 DIAGNOSIS — Z96651 Presence of right artificial knee joint: Secondary | ICD-10-CM

## 2023-07-16 DIAGNOSIS — M3501 Sicca syndrome with keratoconjunctivitis: Secondary | ICD-10-CM

## 2023-07-16 DIAGNOSIS — N94819 Vulvodynia, unspecified: Secondary | ICD-10-CM

## 2023-07-16 DIAGNOSIS — Z79899 Other long term (current) drug therapy: Secondary | ICD-10-CM | POA: Diagnosis not present

## 2023-07-16 DIAGNOSIS — E039 Hypothyroidism, unspecified: Secondary | ICD-10-CM

## 2023-07-16 DIAGNOSIS — M4004 Postural kyphosis, thoracic region: Secondary | ICD-10-CM

## 2023-07-16 DIAGNOSIS — R5383 Other fatigue: Secondary | ICD-10-CM

## 2023-07-16 DIAGNOSIS — M170 Bilateral primary osteoarthritis of knee: Secondary | ICD-10-CM

## 2023-07-16 DIAGNOSIS — M51369 Other intervertebral disc degeneration, lumbar region without mention of lumbar back pain or lower extremity pain: Secondary | ICD-10-CM

## 2023-07-16 DIAGNOSIS — S32010A Wedge compression fracture of first lumbar vertebra, initial encounter for closed fracture: Secondary | ICD-10-CM

## 2023-07-16 DIAGNOSIS — E559 Vitamin D deficiency, unspecified: Secondary | ICD-10-CM

## 2023-07-16 DIAGNOSIS — M8589 Other specified disorders of bone density and structure, multiple sites: Secondary | ICD-10-CM

## 2023-07-16 DIAGNOSIS — M7062 Trochanteric bursitis, left hip: Secondary | ICD-10-CM

## 2023-07-16 DIAGNOSIS — Z8659 Personal history of other mental and behavioral disorders: Secondary | ICD-10-CM

## 2023-07-16 DIAGNOSIS — M5136 Other intervertebral disc degeneration, lumbar region: Secondary | ICD-10-CM | POA: Insufficient documentation

## 2023-07-16 NOTE — Patient Instructions (Signed)
Hand Exercises Hand exercises can be helpful for almost anyone. They can strengthen your hands and improve flexibility and movement. The exercises can also increase blood flow to the hands. These results can make your work and daily tasks easier for you. Hand exercises can be especially helpful for people who have joint pain from arthritis or nerve damage from using their hands over and over. These exercises can also help people who injure a hand. Exercises Most of these hand exercises are gentle stretching and motion exercises. It is usually safe to do them often throughout the day. Warming up your hands before exercise may help reduce stiffness. You can do this with gentle massage or by placing your hands in warm water for 10-15 minutes. It is normal to feel some stretching, pulling, tightness, or mild discomfort when you begin new exercises. In time, this will improve. Remember to always be careful and stop right away if you feel sudden, very bad pain or your pain gets worse. You want to get better and be safe. Ask your health care provider which exercises are safe for you. Do exercises exactly as told by your provider and adjust them as told. Do not begin these exercises until told by your provider. Knuckle bend or "claw" fist  Stand or sit with your arm, hand, and all five fingers pointed straight up. Make sure to keep your wrist straight. Gently bend your fingers down toward your palm until the tips of your fingers are touching your palm. Keep your big knuckle straight and only bend the small knuckles in your fingers. Hold this position for 10 seconds. Straighten your fingers back to your starting position. Repeat this exercise 5-10 times with each hand. Full finger fist  Stand or sit with your arm, hand, and all five fingers pointed straight up. Make sure to keep your wrist straight. Gently bend your fingers into your palm until the tips of your fingers are touching the middle of your  palm. Hold this position for 10 seconds. Extend your fingers back to your starting position, stretching every joint fully. Repeat this exercise 5-10 times with each hand. Straight fist  Stand or sit with your arm, hand, and all five fingers pointed straight up. Make sure to keep your wrist straight. Gently bend your fingers at the big knuckle, where your fingers meet your hand, and at the middle knuckle. Keep the knuckle at the tips of your fingers straight and try to touch the bottom of your palm. Hold this position for 10 seconds. Extend your fingers back to your starting position, stretching every joint fully. Repeat this exercise 5-10 times with each hand. Tabletop  Stand or sit with your arm, hand, and all five fingers pointed straight up. Make sure to keep your wrist straight. Gently bend your fingers at the big knuckle, where your fingers meet your hand, as far down as you can. Keep the small knuckles in your fingers straight. Think of forming a tabletop with your fingers. Hold this position for 10 seconds. Extend your fingers back to your starting position, stretching every joint fully. Repeat this exercise 5-10 times with each hand. Finger spread  Place your hand flat on a table with your palm facing down. Make sure your wrist stays straight. Spread your fingers and thumb apart from each other as far as you can until you feel a gentle stretch. Hold this position for 10 seconds. Bring your fingers and thumb tight together again. Hold this position for 10 seconds. Repeat  this exercise 5-10 times with each hand. Making circles  Stand or sit with your arm, hand, and all five fingers pointed straight up. Make sure to keep your wrist straight. Make a circle by touching the tip of your thumb to the tip of your index finger. Hold for 10 seconds. Then open your hand wide. Repeat this motion with your thumb and each of your fingers. Repeat this exercise 5-10 times with each hand. Thumb  motion  Sit with your forearm resting on a table and your wrist straight. Your thumb should be facing up toward the ceiling. Keep your fingers relaxed as you move your thumb. Lift your thumb up as high as you can toward the ceiling. Hold for 10 seconds. Bend your thumb across your palm as far as you can, reaching the tip of your thumb for the small finger (pinkie) side of your palm. Hold for 10 seconds. Repeat this exercise 5-10 times with each hand. Grip strengthening  Hold a stress ball or other soft ball in the middle of your hand. Slowly increase the pressure, squeezing the ball as much as you can without causing pain. Think of bringing the tips of your fingers into the middle of your palm. All of your finger joints should bend when doing this exercise. Hold your squeeze for 10 seconds, then relax. Repeat this exercise 5-10 times with each hand. Contact a health care provider if: Your hand pain or discomfort gets much worse when you do an exercise. Your hand pain or discomfort does not improve within 2 hours after you exercise. If you have either of these problems, stop doing these exercises right away. Do not do them again unless your provider says that you can. Get help right away if: You develop sudden, severe hand pain or swelling. If this happens, stop doing these exercises right away. Do not do them again unless your provider says that you can. This information is not intended to replace advice given to you by your health care provider. Make sure you discuss any questions you have with your health care provider. Document Revised: 10/23/2022 Document Reviewed: 10/23/2022 Elsevier Patient Education  2024 [employer] Vaccines You are taking a medication(s) that can suppress your immune system.  The following immunizations are recommended: Flu annually Covid-19  RSV Td/Tdap (tetanus, diphtheria, pertussis) every 10 years Pneumonia (Prevnar 15 then Pneumovax 23 at least 1 year  apart.  Alternatively, can take Prevnar 20 without needing additional dose) Shingrix: 2 doses from 4 weeks to 6 months apart  Please check with your PCP to make sure you are up to date.

## 2023-07-17 LAB — C3 AND C4
C3 Complement: 138 mg/dL (ref 83–193)
C4 Complement: 21 mg/dL (ref 15–57)

## 2023-07-17 LAB — PROTEIN ELECTROPHORESIS, SERUM, WITH REFLEX: Total Protein: 7 g/dL (ref 6.1–8.1)

## 2023-07-17 LAB — VITAMIN D 25 HYDROXY (VIT D DEFICIENCY, FRACTURES): Vit D, 25-Hydroxy: 38 ng/mL (ref 30–100)

## 2023-07-17 LAB — ANTI-DNA ANTIBODY, DOUBLE-STRANDED: ds DNA Ab: 1 IU/mL

## 2023-07-19 ENCOUNTER — Other Ambulatory Visit: Payer: Self-pay | Admitting: Physician Assistant

## 2023-07-19 DIAGNOSIS — M3501 Sicca syndrome with keratoconjunctivitis: Secondary | ICD-10-CM

## 2023-07-19 DIAGNOSIS — Z79899 Other long term (current) drug therapy: Secondary | ICD-10-CM

## 2023-07-19 DIAGNOSIS — M359 Systemic involvement of connective tissue, unspecified: Secondary | ICD-10-CM

## 2023-07-19 LAB — PROTEIN ELECTROPHORESIS, SERUM, WITH REFLEX
Albumin ELP: 4.1 g/dL (ref 3.8–4.8)
Alpha 1: 0.3 g/dL (ref 0.2–0.3)
Alpha 2: 0.7 g/dL (ref 0.5–0.9)
Beta 2: 0.4 g/dL (ref 0.2–0.5)
Beta Globulin: 0.4 g/dL (ref 0.4–0.6)
Gamma Globulin: 1.1 g/dL (ref 0.8–1.7)

## 2023-07-19 LAB — CBC WITH DIFFERENTIAL/PLATELET
Absolute Monocytes: 703 {cells}/uL (ref 200–950)
Basophils Absolute: 74 {cells}/uL (ref 0–200)
Basophils Relative: 1 %
Eosinophils Absolute: 303 {cells}/uL (ref 15–500)
Eosinophils Relative: 4.1 %
HCT: 43.5 % (ref 35.0–45.0)
Hemoglobin: 14.2 g/dL (ref 11.7–15.5)
Lymphs Abs: 969 {cells}/uL (ref 850–3900)
MCH: 32.3 pg (ref 27.0–33.0)
MCHC: 32.6 g/dL (ref 32.0–36.0)
MCV: 99.1 fL (ref 80.0–100.0)
MPV: 10.9 fL (ref 7.5–12.5)
Monocytes Relative: 9.5 %
Neutro Abs: 5350 {cells}/uL (ref 1500–7800)
Neutrophils Relative %: 72.3 %
Platelets: 274 Thousand/uL (ref 140–400)
RBC: 4.39 Million/uL (ref 3.80–5.10)
RDW: 11.8 % (ref 11.0–15.0)
Total Lymphocyte: 13.1 %
WBC: 7.4 Thousand/uL (ref 3.8–10.8)

## 2023-07-19 LAB — PROTEIN / CREATININE RATIO, URINE
Creatinine, Urine: 62 mg/dL (ref 20–275)
Protein/Creat Ratio: 113 mg/g{creat} (ref 24–184)
Protein/Creatinine Ratio: 0.113 mg/mg{creat} (ref 0.024–0.184)
Total Protein, Urine: 7 mg/dL (ref 5–24)

## 2023-07-19 LAB — COMPLETE METABOLIC PANEL WITHOUT GFR
AG Ratio: 1.5 (calc) (ref 1.0–2.5)
ALT: 15 U/L (ref 6–29)
AST: 19 U/L (ref 10–35)
Albumin: 4.2 g/dL (ref 3.6–5.1)
Alkaline phosphatase (APISO): 58 U/L (ref 37–153)
BUN: 16 mg/dL (ref 7–25)
CO2: 27 mmol/L (ref 20–32)
Calcium: 9.7 mg/dL (ref 8.6–10.4)
Chloride: 103 mmol/L (ref 98–110)
Creat: 0.78 mg/dL (ref 0.60–1.00)
Globulin: 2.8 g/dL (ref 1.9–3.7)
Glucose, Bld: 95 mg/dL (ref 65–99)
Potassium: 4.2 mmol/L (ref 3.5–5.3)
Sodium: 139 mmol/L (ref 135–146)
Total Bilirubin: 0.4 mg/dL (ref 0.2–1.2)
Total Protein: 7 g/dL (ref 6.1–8.1)
eGFR: 79 mL/min/1.73m2

## 2023-07-19 LAB — SEDIMENTATION RATE: Sed Rate: 19 mm/h (ref 0–30)

## 2023-07-19 NOTE — Progress Notes (Signed)
CBC, CMP, urine protein creatinine ratio, sed rate, vitamin D, SPEP, complements are all within normal limits.

## 2023-07-19 NOTE — Telephone Encounter (Signed)
Last Fill: 04/22/2023  Eye exam: 10/29/2022 Normal.   Labs: 07/16/2023 CBC, CMP, urine protein creatinine ratio, sed rate, vitamin D, SPEP, complements are all within normal limits.   Next Visit: 12/16/2023  Last Visit: 07/16/2023  [id number] syndrome with keratoconjunctivitis sicca   Current Dose per office note 07/16/2023: Plaquenil 200 mg 1 tablet by mouth daily   Okay to refill Plaquenil?

## 2023-07-21 MED ORDER — HYDROXYCHLOROQUINE SULFATE 200 MG PO TABS: 200.0000 mg | ORAL_TABLET | Freq: Every day | ORAL | 0 refills | Status: DC

## 2023-07-23 ENCOUNTER — Inpatient Hospital Stay
Admission: RE | Admit: 2023-07-23 | Discharge: 2023-07-23 | Disposition: A | Discharge status: Home / Self Care | Payer: Medicare Other | Source: Ambulatory Visit | Attending: Family Medicine | Admitting: Family Medicine

## 2023-07-23 DIAGNOSIS — M810 Age-related osteoporosis without current pathological fracture: Secondary | ICD-10-CM

## 2023-09-02 ENCOUNTER — Ambulatory Visit
Admission: RE | Admit: 2023-09-02 | Discharge: 2023-09-02 | Disposition: A | Discharge status: Home / Self Care | Payer: Medicare Other | Source: Ambulatory Visit | Attending: Family Medicine | Admitting: Family Medicine

## 2023-09-02 DIAGNOSIS — Z1231 Encounter for screening mammogram for malignant neoplasm of breast: Secondary | ICD-10-CM

## 2023-09-15 ENCOUNTER — Other Ambulatory Visit: Payer: Self-pay | Admitting: Physician Assistant

## 2023-09-16 NOTE — Telephone Encounter (Signed)
Last Fill: 06/21/2023  Next Visit: 12/16/2023  Last Visit: 07/16/2023  Dx: History of depression   Current Dose per office note on 07/16/2023: not discussed  Okay to refill Lexapro?

## 2023-09-26 ENCOUNTER — Other Ambulatory Visit: Payer: Self-pay | Admitting: Rheumatology

## 2023-09-26 DIAGNOSIS — M359 Systemic involvement of connective tissue, unspecified: Secondary | ICD-10-CM

## 2023-09-26 DIAGNOSIS — M3501 Sicca syndrome with keratoconjunctivitis: Secondary | ICD-10-CM

## 2023-09-26 DIAGNOSIS — Z79899 Other long term (current) drug therapy: Secondary | ICD-10-CM

## 2023-09-26 NOTE — Telephone Encounter (Signed)
Last Fill: 07/21/2023  Eye exam:  09/05/2023 WNL    Labs: 07/16/2023 CBC, CMP, urine protein creatinine ratio, sed rate, vitamin D, SPEP, complements are all within normal limits.   Next Visit: 12/16/2023  Last Visit: 07/16/2023  FA:[id number] syndrome with keratoconjunctivitis sicca   Current Dose per office note 07/16/2023: Plaquenil 200 mg 1 tablet by mouth daily.   Okay to refill Plaquenil?

## 2023-12-04 NOTE — Progress Notes (Unsigned)
 Office Visit Note  Patient: [name]             Date of Birth: 04/07/1946           MRN: [id number]             PCP: Gweneth Dimitri, MD Referring: Gweneth Dimitri, MD Visit Date: 12/18/2023 Occupation: @GUAROCC @  Subjective:  Pain in multiple joints   History of Present Illness: [name] is a 78 y.o. female with history of sjogren's syndrome and osteoarthritis.  Patient remains on  Plaquenil 200 mg 1 tablet by mouth daily.  She is tolerating Plaquenil without any side effects and has not had any recent gaps in therapy.  She continues to have chronic sicca symptoms and has been using over-the-counter products for symptomatic relief.  She denies any swollen lymph nodes or night sweats.  She denies any new or worsening pulmonary symptoms.  She denies any recent rashes.  Her primary concern remains joint pain particularly involving her right thumb and wrist as well as the left knee joint.  Patient states that she had a right thumb injection performed at the end of January 2025 which only provided relief for 1 week.  She has been using a brace for support but continues to have persistent discomfort.  She has been taking tramadol sparingly for pain relief.  She has no relief with use of Tylenol.  She takes ibuprofen sparingly for pain relief.  Patient states that she is scheduled to receive the third injection of the Visco series for the left knee tomorrow at [facility].  She denies any joint swelling at this time. Patient states that she is due for her next Prolia injection in March 2025.     Activities of Daily Living:  Patient reports morning stiffness for 0 minute.   Patient Denies nocturnal pain.  Difficulty dressing/grooming: Denies Difficulty climbing stairs: Denies Difficulty getting out of chair: Denies Difficulty using hands for taps, buttons, cutlery, and/or writing: Reports  Review of Systems  Constitutional:  Negative for fatigue.  HENT:  Negative for mouth  sores and mouth dryness.   Eyes:  Positive for dryness.  Respiratory:  Negative for shortness of breath.   Cardiovascular:  Negative for chest pain and palpitations.  Gastrointestinal:  Negative for blood in stool, constipation and diarrhea.  Endocrine: Negative for increased urination.  Genitourinary:  Negative for involuntary urination.  Musculoskeletal:  Positive for joint pain, gait problem and joint pain. Negative for joint swelling, myalgias, muscle weakness, morning stiffness, muscle tenderness and myalgias.  Skin:  Positive for sensitivity to sunlight. Negative for color change, rash and hair loss.  Allergic/Immunologic: Negative for susceptible to infections.  Neurological:  Negative for dizziness and headaches.  Hematological:  Negative for swollen glands.  Psychiatric/Behavioral:  Positive for sleep disturbance. Negative for depressed mood. The patient is not nervous/anxious.     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/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    Past  Medical History:  Diagnosis Date   Allergy    Anxiety    Atherosclerosis    Cancer (HCC)    Basal cell skin cancer   Colon polyp    Compression fracture of L1 lumbar vertebra (HCC)    dx by ortho   Environmental allergies    Hypothyroid    Osteoarthritis    Osteoarthritis    Osteoarthritis    Osteopenia    Plantar fasciitis, right    Raynauds phenomenon    Serrated adenoma of  appendiceal oriface 09/10/2011   Sjogren's syndrome (HCC)    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   Vulvodynia     Family History  Problem Relation Age of Onset   Heart disease Mother    Psoriasis Father    Arthritis Father    Heart disease Brother    Heart attack Brother    Diabetes Maternal Grandfather    Cancer Maternal Uncle        colon   Colon cancer Maternal Uncle 10   Asthma Sister    Breast cancer Neg Hx    Past Surgical History:  Procedure Laterality Date   APPENDECTOMY     BUNIONECTOMY  2008   right foot   CATARACT EXTRACTION     COLONOSCOPY  12/05/2016   Dr.Jacobs   COLONOSCOPY W/ POLYPECTOMY  08/27/2011   ENDOMETRIAL ABLATION     FOOT SURGERY Right 12/17/2017   bone spur   ingrown toenail Right 12/16/2018   LAPAROSCOPIC APPENDECTOMY  10/11/2011   Procedure: APPENDECTOMY LAPAROSCOPIC;  Surgeon: Ardeth Sportsman, MD;  Location: [facility];  Service: General;  Laterality: N/A;  Partial Cecectomy and Appendectomy   POLYPECTOMY     TOTAL KNEE ARTHROPLASTY Right 04/08/2023   Procedure: TOTAL KNEE ARTHROPLASTY;  Surgeon: Ollen Gross, MD;  Location: [facility];  Service: Orthopedics;  Laterality: Right;   WISDOM TOOTH EXTRACTION     Social History   Social History Narrative   Not on file   Immunization History  Administered Date(s) Administered   DTaP 06/16/2021   Influenza, High Dose Seasonal PF 07/05/2018, 06/18/2019, 07/11/2022   Influenza,inj,quad, With Preservative 07/10/2017   Influenza-Unspecified 07/23/2020, 06/26/2021, 07/12/2021, 06/10/2023   PFIZER(Purple Top)SARS-COV-2 Vaccination 11/16/2019, 12/07/2019, 06/14/2020, 05/16/2021   Pfizer Covid-19 Vaccine Bivalent Booster 19yrs & up 07/27/2022   Pneumococcal Conjugate-13 09/08/2014   Pneumococcal Polysaccharide-23 08/30/2011   RSV,unspecified 10/31/2022   Tdap 06/20/2011, 06/16/2021   Zoster Recombinant(Shingrix) 02/05/2017, 05/08/2017   Zoster, Live 10/11/2007     Objective: Vital Signs: BP 128/82 (BP Location:  Left Arm, Patient Position: Sitting, Cuff Size: Normal)   Pulse 67   Resp 14   Ht 5\' 5"  (1.651 m)   Wt 156 lb (70.8 kg)   BMI 25.96 kg/m    Physical Exam Vitals and nursing note reviewed.  Constitutional:      Appearance: She is well-developed.  HENT:     Head: Normocephalic and atraumatic.  Eyes:     Conjunctiva/sclera: Conjunctivae normal.  Cardiovascular:     Rate and Rhythm: Normal rate and regular rhythm.     Heart sounds: Normal heart sounds.  Pulmonary:     Effort: Pulmonary effort is normal.     Breath sounds: Normal breath sounds.  Abdominal:     General: Bowel sounds are normal.     Palpations: Abdomen is soft.  Musculoskeletal:     Cervical back: Normal range of motion.  Lymphadenopathy:     Cervical: No cervical adenopathy.  Skin:    General: Skin is  warm and dry.     Capillary Refill: Capillary refill takes less than 2 seconds.  Neurological:     Mental Status: She is alert and oriented to person, place, and time.  Psychiatric:        Behavior: Behavior normal.      Musculoskeletal Exam: C-spine has good range of motion.  Mild thoracic episodes noted.  Shoulder joints, elbow joints, wrist joints have good range of motion.  CMC, PIP, DIP thickening consistent with osteoarthritis of both hands.  Tenderness of the right CMC joint extending up the radial styloid tendon.  Hip joints have good range of motion with no groin pain.  Right knee replacement has good range of motion with some warmth.  Limited extension of the left knee.  Ankle joints have good ROM with no tenderness or joint swelling   CDAI Exam: CDAI Score: -- Patient Global: --; Provider Global: -- Swollen: --; Tender: -- Joint Exam 12/18/2023   No joint exam has been documented for this visit   There is currently no information documented on the homunculus. Go to the Rheumatology activity and complete the homunculus joint exam.  Investigation: No additional findings.  Imaging: No results  found.  Recent Labs: Lab Results  Component Value Date   WBC 7.4 07/16/2023   HGB 14.2 07/16/2023   PLT 274 07/16/2023   NA 139 07/16/2023   K 4.2 07/16/2023   CL 103 07/16/2023   CO2 27 07/16/2023   GLUCOSE 95 07/16/2023   BUN 16 07/16/2023   CREATININE 0.78 07/16/2023   BILITOT 0.4 07/16/2023   ALKPHOS 48 02/11/2017   AST 19 07/16/2023   ALT 15 07/16/2023   PROT 7.0 07/16/2023   PROT 7.0 07/16/2023   ALBUMIN 3.9 02/11/2017   CALCIUM 9.7 07/16/2023   GFRAA 92 01/18/2021    Speciality Comments: PLQ eye exam: 09/05/2023 WNL @ [facility] Follow up in 1 year   Procedures:  No procedures performed Allergies: Boniva [ibandronate], Chlorhexidine gluconate, Latex, Sulfonamide derivatives, and Bacitracin-polymyxin b     Assessment / Plan:     Visit Diagnoses: Sjogren's syndrome with keratoconjunctivitis sicca (HCC) - ANA+, Ro+, sicca symptoms, Raynaud's, inflammatory arthritis: Patient continues to have chronic sicca symptoms and has been using over-the-counter products for symptomatic relief.  She remains on Plaquenil 200 mg 1 tablet by mouth daily.  No synovitis was noted on examination today.  She continues to have arthralgias particularly in her right hand and left knee joint due to underlying osteoarthritis.  Discussed the use of natural anti-inflammatories. No new or worsening pulmonary symptoms.  Lungs were clear to auscultation today.  Discussed the increased risk for developing interstitial lung disease in patients with surgery and syndrome.  Patient has not noticed any cervical lymphadenopathy and has not had any night sweats.  Discussed the increased risk for developing lymphoma in patients with Sjogren's syndrome. Lab work from 07/16/23 was reviewed today in the office: SPEP WNL, ESR WNL, complements WNL, dsDNA is negative, urine protein creatinine ratio WNL, and vitamin D WNL.  The following lab work will be obtained today for further evaluation.  She will  remain on Plaquenil as prescribed.  She was advised to notify us if she develops any new or worsening symptoms.  She will follow-up in the office in 5 months or sooner if needed. - Plan: Protein / creatinine ratio, urine, CBC with Differential/Platelet, COMPLETE METABOLIC PANEL WITH GFR, Sedimentation rate, C3 and C4, Serum protein electrophoresis with reflex  High risk medication  use - Plaquenil 200 mg 1 tablet by mouth daily. PLQ eye exam: 09/05/2023 WNL @ [facility] Follow up in 1 year  CBC and CMP updated on 07/16/23. Orders for CBC and CMP released today.    - Plan: CBC with Differential/Platelet, COMPLETE METABOLIC PANEL WITH GFR  Primary osteoarthritis of both hands: Patient presents today with ongoing pain in both hands particularly her right thumb.  She has tenderness over the right CMC extending along the radial styloid tendon.  She had a right thumb injection performed at the end of January 2025 which only provided relief for 1 week.  She has been using a wrist brace for support.  She is not ready to proceed with surgical intervention.  Patient considered using CBD cream but wanted to check with [place] prior to use.  Discussed that the cream that she has has THC and can cause a UDS to be positive.  Discussed that ACR does not recommend the use of CBD at this time.  Discussed that she can use Voltaren gel topically as needed for pain relief.  Discussed the importance of joint protection and muscle strengthening.  Discussed the use of natural anti-inflammatories including turmeric, tart cherry, ginger, and omega-3.  Discussed that she may benefit from a [facility] joint brace rather than a wrist splint.  Trochanteric bursitis of left hip: Not currently symptomatic.   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 Despina Hick.  Doing well.    Primary osteoarthritis of left knee: She is scheduled to receive her third Visco injection of the series this week at [facility].  She  can use voltaren gel topically as needed for pain relief.   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   Degeneration of intervertebral disc of lumbar region without discogenic back pain or lower extremity pain: No symptoms of radiculopathy.   Osteopenia of multiple sites - Previous DEXA on 05/03/21: The BMD measured at Femur Neck Right is 0.759 g/cm2 with a T-score of -2.0.  DEXA updated on 07/23/23: BMD measured at Femur Neck Left is 0.794 g/cm2 with a T-score of -1.8. She is taking a calcium and vitamin D supplement daily.  Prolia 60 mg subcutaneous injections every 6 months--ordered by PCP--due in March 2025.   Vitamin D deficiency: She is taking a calcium and vitamin D supplement daily.   Closed compression fracture of body of L1 vertebra (HCC)  Other medical conditions are listed as follows:   Other fatigue  History of anxiety  History of depression  Hypothyroidism, adult  Vulvodynia  Orders: Orders Placed This Encounter  Procedures   Protein / creatinine ratio, urine   CBC with Differential/Platelet   COMPLETE METABOLIC PANEL WITH GFR   Sedimentation rate   C3 and C4   Serum protein electrophoresis with reflex   No orders of the defined types were placed in this encounter.     Follow-Up Instructions: Return in about 5 months (around 05/16/2024) for Sjogren's syndrome, Osteoarthritis.   Gearldine Bienenstock, PA-C  Note - This record has been created using Dragon software.  Chart creation errors have been sought, but may not always  have been located. Such creation errors do not reflect on  the standard of medical care.

## 2023-12-13 ENCOUNTER — Other Ambulatory Visit: Payer: Self-pay | Admitting: Physician Assistant

## 2023-12-13 NOTE — Telephone Encounter (Signed)
Last Fill: 09/16/2023  Next Visit: 12/18/2023  Last Visit: 07/16/2023  Dx: History of depression   Current Dose per office note on 07/16/2023:  not discussed   Okay to refill Lexapro?

## 2023-12-16 ENCOUNTER — Ambulatory Visit: Payer: Medicare Other | Admitting: Physician Assistant

## 2023-12-18 ENCOUNTER — Ambulatory Visit: Payer: Medicare Other | Attending: Physician Assistant | Admitting: Physician Assistant

## 2023-12-18 ENCOUNTER — Encounter: Payer: Self-pay | Admitting: Physician Assistant

## 2023-12-18 VITALS — BP 128/82 | HR 67 | Resp 14 | Ht 65.0 in | Wt 156.0 lb

## 2023-12-18 DIAGNOSIS — M1712 Unilateral primary osteoarthritis, left knee: Secondary | ICD-10-CM

## 2023-12-18 DIAGNOSIS — N94819 Vulvodynia, unspecified: Secondary | ICD-10-CM

## 2023-12-18 DIAGNOSIS — M7062 Trochanteric bursitis, left hip: Secondary | ICD-10-CM

## 2023-12-18 DIAGNOSIS — M4004 Postural kyphosis, thoracic region: Secondary | ICD-10-CM

## 2023-12-18 DIAGNOSIS — R5383 Other fatigue: Secondary | ICD-10-CM

## 2023-12-18 DIAGNOSIS — Z96651 Presence of right artificial knee joint: Secondary | ICD-10-CM

## 2023-12-18 DIAGNOSIS — E039 Hypothyroidism, unspecified: Secondary | ICD-10-CM

## 2023-12-18 DIAGNOSIS — M3501 Sicca syndrome with keratoconjunctivitis: Secondary | ICD-10-CM

## 2023-12-18 DIAGNOSIS — E559 Vitamin D deficiency, unspecified: Secondary | ICD-10-CM

## 2023-12-18 DIAGNOSIS — M19041 Primary osteoarthritis, right hand: Secondary | ICD-10-CM | POA: Diagnosis not present

## 2023-12-18 DIAGNOSIS — Z79899 Other long term (current) drug therapy: Secondary | ICD-10-CM | POA: Diagnosis not present

## 2023-12-18 DIAGNOSIS — M170 Bilateral primary osteoarthritis of knee: Secondary | ICD-10-CM

## 2023-12-18 DIAGNOSIS — M51369 Other intervertebral disc degeneration, lumbar region without mention of lumbar back pain or lower extremity pain: Secondary | ICD-10-CM

## 2023-12-18 DIAGNOSIS — M8589 Other specified disorders of bone density and structure, multiple sites: Secondary | ICD-10-CM

## 2023-12-18 DIAGNOSIS — M19042 Primary osteoarthritis, left hand: Secondary | ICD-10-CM

## 2023-12-18 DIAGNOSIS — S32010A Wedge compression fracture of first lumbar vertebra, initial encounter for closed fracture: Secondary | ICD-10-CM

## 2023-12-18 DIAGNOSIS — Z8659 Personal history of other mental and behavioral disorders: Secondary | ICD-10-CM

## 2023-12-19 NOTE — Progress Notes (Signed)
 Urine protein creatinine ratio is slightly elevated but no proteinuria noted.  We will continue to monitor with routine lab work CBC and CMP WNL ESR WNL  Complements WNL

## 2023-12-20 LAB — CBC WITH DIFFERENTIAL/PLATELET
Absolute Lymphocytes: 871 {cells}/uL (ref 850–3900)
Absolute Monocytes: 529 {cells}/uL (ref 200–950)
Basophils Absolute: 40 {cells}/uL (ref 0–200)
Basophils Relative: 0.6 %
Eosinophils Absolute: 168 {cells}/uL (ref 15–500)
Eosinophils Relative: 2.5 %
HCT: 43.1 % (ref 35.0–45.0)
Hemoglobin: 14 g/dL (ref 11.7–15.5)
MCH: 32.5 pg (ref 27.0–33.0)
MCHC: 32.5 g/dL (ref 32.0–36.0)
MCV: 100 fL (ref 80.0–100.0)
MPV: 10.8 fL (ref 7.5–12.5)
Monocytes Relative: 7.9 %
Neutro Abs: 5092 {cells}/uL (ref 1500–7800)
Neutrophils Relative %: 76 %
Platelets: 249 10*3/uL (ref 140–400)
RBC: 4.31 10*6/uL (ref 3.80–5.10)
RDW: 11.8 % (ref 11.0–15.0)
Total Lymphocyte: 13 %
WBC: 6.7 10*3/uL (ref 3.8–10.8)

## 2023-12-20 LAB — COMPLETE METABOLIC PANEL WITH GFR
AG Ratio: 1.6 (calc) (ref 1.0–2.5)
ALT: 16 U/L (ref 6–29)
AST: 24 U/L (ref 10–35)
Albumin: 4.4 g/dL (ref 3.6–5.1)
Alkaline phosphatase (APISO): 44 U/L (ref 37–153)
BUN: 17 mg/dL (ref 7–25)
CO2: 30 mmol/L (ref 20–32)
Calcium: 9.8 mg/dL (ref 8.6–10.4)
Chloride: 104 mmol/L (ref 98–110)
Creat: 0.72 mg/dL (ref 0.60–1.00)
Globulin: 2.7 g/dL (ref 1.9–3.7)
Glucose, Bld: 82 mg/dL (ref 65–99)
Potassium: 4.2 mmol/L (ref 3.5–5.3)
Sodium: 141 mmol/L (ref 135–146)
Total Bilirubin: 0.6 mg/dL (ref 0.2–1.2)
Total Protein: 7.1 g/dL (ref 6.1–8.1)
eGFR: 86 mL/min/{1.73_m2} (ref >=60)

## 2023-12-20 LAB — SEDIMENTATION RATE: Sed Rate: 9 mm/h (ref 0–30)

## 2023-12-20 LAB — PROTEIN / CREATININE RATIO, URINE
Creatinine, Urine: 19 mg/dL — ABNORMAL LOW (ref 20–275)
Protein/Creat Ratio: 211 mg/g{creat} — ABNORMAL HIGH (ref 24–184)
Protein/Creatinine Ratio: 0.211 mg/mg{creat} — ABNORMAL HIGH (ref 0.024–0.184)
Total Protein, Urine: 4 mg/dL — ABNORMAL LOW (ref 5–24)

## 2023-12-20 LAB — PROTEIN ELECTROPHORESIS, SERUM, WITH REFLEX
Albumin ELP: 4.3 g/dL (ref 3.8–4.8)
Alpha 1: 0.3 g/dL (ref 0.2–0.3)
Alpha 2: 0.6 g/dL (ref 0.5–0.9)
Beta 2: 0.4 g/dL (ref 0.2–0.5)
Beta Globulin: 0.4 g/dL (ref 0.4–0.6)
Gamma Globulin: 1.1 g/dL (ref 0.8–1.7)
Total Protein: 7.1 g/dL (ref 6.1–8.1)

## 2023-12-20 LAB — C3 AND C4
C3 Complement: 133 mg/dL (ref 83–193)
C4 Complement: 21 mg/dL (ref 15–57)

## 2023-12-23 NOTE — Progress Notes (Signed)
 SPEP  normal.

## 2024-01-22 ENCOUNTER — Other Ambulatory Visit: Payer: Self-pay | Admitting: Rheumatology

## 2024-01-22 DIAGNOSIS — M359 Systemic involvement of connective tissue, unspecified: Secondary | ICD-10-CM

## 2024-01-22 DIAGNOSIS — M3501 Sicca syndrome with keratoconjunctivitis: Secondary | ICD-10-CM

## 2024-01-22 DIAGNOSIS — Z79899 Other long term (current) drug therapy: Secondary | ICD-10-CM

## 2024-01-22 NOTE — Telephone Encounter (Signed)
 Last Fill: 09/26/2023  Eye exam: 09/05/2023 WNL    Labs: 12/18/2023 CBC and CMP WNL   Next Visit: 05/19/2024  Last Visit: 12/18/2023  [id number] syndrome with keratoconjunctivitis sicca   Current Dose per office note 12/18/2023: Plaquenil 200 mg 1 tablet by mouth daily.   Okay to refill Plaquenil?

## 2024-02-06 MED ORDER — HYDROXYCHLOROQUINE SULFATE 200 MG PO TABS
200.0000 mg | ORAL_TABLET | Freq: Every day | ORAL | 0 refills | Status: DC
Start: 2024-02-06 — End: 2024-05-19

## 2024-02-06 NOTE — Addendum Note (Signed)
 Addended by: Adrianne Horn on: 02/06/2024 12:33 PM   Modules accepted: Orders

## 2024-03-08 ENCOUNTER — Other Ambulatory Visit: Payer: Self-pay | Admitting: Rheumatology

## 2024-03-09 NOTE — Telephone Encounter (Signed)
 Last Fill: 12/13/2023  Next Visit: 05/19/2024  Last Visit: 12/18/2023  Dx: History of anxiety   Current Dose per office note on 12/18/2023: not dicussed  Okay to refill Lexapro ?

## 2024-04-13 HISTORY — PX: OTHER SURGICAL HISTORY: SHX169

## 2024-05-05 NOTE — Progress Notes (Unsigned)
 Office Visit Note  Patient: [name]             Date of Birth: [birth year]           MRN: [id number]             PCP: Aisha Harvey, MD Referring: Aisha Harvey, MD Visit Date: 05/19/2024 Occupation: @GUAROCC @  Subjective:  Medication monitoring   History of Present Illness: [name] is a 78 y.o. female with history of sjogren's syndrome and osteoarthritis.  She is taking plaquenil  200 mg 1 tablet by mouth daily.  She tolerating Plaquenil  without any side effects and has not had any recent gaps in therapy.  She has not had any signs or symptoms of a flare.  She continues to have chronic mouth dryness and has been seeing the dentist 3 times a year.  Her eye dryness has been under control and she continues to follow-up with ophthalmology.  She denies any swollen lymph nodes or low-grade fevers.  Her energy level has been stable.  She denies any shortness of breath or new pulmonary symptoms. Patient's been experiencing ongoing pain in the right [facility] joint.  She remains under the care of Dr. Alyse and will be going for a right [facility] joint injection in August 2025.  Patient states that her right knee replacement and left knee joint are doing well.  She has continued to undergo viscosupplementation for the left knee and is not planning surgical intervention at this time.  She has occasional discomfort on the lateral aspect of the left hip due to trochanter bursitis but overall her symptoms have been manageable. She denies any new medical conditions.  She denies any upcoming surgeries.    Activities of Daily Living:  Patient denies any morning stiffness   Patient Reports nocturnal pain.  Difficulty dressing/grooming: Denies Difficulty climbing stairs: Denies Difficulty getting out of chair: Denies Difficulty using hands for taps, buttons, cutlery, and/or writing: Reports  Review of Systems  Constitutional:  Negative for fatigue.  HENT:  Positive for mouth dryness. Negative for  mouth sores.   Eyes:  Negative for dryness.  Respiratory:  Negative for shortness of breath.   Cardiovascular:  Negative for chest pain and palpitations.  Gastrointestinal:  Negative for blood in stool, constipation and diarrhea.  Endocrine: Negative for increased urination.  Genitourinary:  Negative for involuntary urination.  Musculoskeletal:  Positive for muscle weakness. Negative for joint pain, gait problem, joint pain, joint swelling, myalgias, morning stiffness, muscle tenderness and myalgias.  Skin:  Positive for sensitivity to sunlight. Negative for color change, rash and hair loss.  Allergic/Immunologic: Negative for susceptible to infections.  Neurological:  Negative for dizziness and headaches.  Hematological:  Negative for swollen glands.  Psychiatric/Behavioral:  Negative for depressed mood and sleep disturbance. The patient is not nervous/anxious.     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% 07/20/2009    Past Medical History:  Diagnosis Date   Allergy  Anxiety    Atherosclerosis    Cancer (HCC)    Basal cell skin cancer   Colon polyp    Compression fracture of L1 lumbar vertebra (HCC)    dx by ortho   Environmental allergies    Hypothyroid    Osteoarthritis    Osteoarthritis    Osteoarthritis    Osteopenia    Plantar fasciitis, right    Raynauds phenomenon    Serrated adenoma of appendiceal oriface  09/10/2011   Sjogren's syndrome (HCC)    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   Vulvodynia     Family History  Problem Relation Age of Onset   Heart disease Mother    Psoriasis Father    Arthritis Father    Heart disease Brother    Heart attack Brother    Diabetes Maternal Grandfather    Cancer Maternal Uncle        colon   Colon cancer Maternal Uncle 15   Asthma Sister    Breast cancer Neg Hx    Past Surgical History:  Procedure Laterality Date   APPENDECTOMY     BUNIONECTOMY  2008   right foot   CATARACT EXTRACTION     COLONOSCOPY  12/05/2016   Dr.Jacobs   COLONOSCOPY W/ POLYPECTOMY  08/27/2011   ENDOMETRIAL ABLATION     FOOT SURGERY Right 12/17/2017   bone spur   ingrown toenail Right 12/16/2018   LAPAROSCOPIC APPENDECTOMY  10/11/2011   Procedure: APPENDECTOMY LAPAROSCOPIC;  Surgeon: Elspeth KYM Schultze, MD;  Location: [facility];  Service: General;  Laterality: N/A;  Partial Cecectomy and Appendectomy   OTHER SURGICAL HISTORY Left 04/13/2024   YAG LASER CAPSULATOMY   POLYPECTOMY     TOTAL KNEE ARTHROPLASTY Right 04/08/2023   Procedure: TOTAL KNEE ARTHROPLASTY;  Surgeon: Melodi Lerner, MD;  Location: [facility];  Service: Orthopedics;  Laterality: Right;   WISDOM TOOTH EXTRACTION     Social History   Social History Narrative   Not on file   Immunization History  Administered Date(s) Administered   DTaP 06/16/2021   Influenza, High Dose Seasonal PF 07/05/2018, 06/18/2019, 07/11/2022   Influenza,inj,quad, With Preservative 07/10/2017   Influenza-Unspecified 07/23/2020, 06/26/2021, 07/12/2021, 06/10/2023   PFIZER(Purple Top)SARS-COV-2 Vaccination 11/16/2019, 12/07/2019, 06/14/2020, 05/16/2021   Pfizer Covid-19 Vaccine Bivalent Booster 63yrs & up 07/27/2022   Pneumococcal Conjugate-13 09/08/2014   Pneumococcal Polysaccharide-23 08/30/2011   RSV,unspecified 10/31/2022   Tdap 06/20/2011, 06/16/2021   Zoster Recombinant(Shingrix) 02/05/2017, 05/08/2017   Zoster, Live 10/11/2007      Objective: Vital Signs: BP 110/70 (BP Location: Left Arm, Patient Position: Sitting, Cuff Size: Normal)   Pulse (!) 59   Resp 16   Ht 5' 5 (1.651 m)   Wt 150 lb 3.2 oz (68.1 kg)   BMI 24.99 kg/m    Physical Exam Vitals and nursing note reviewed.  Constitutional:      Appearance: She is well-developed.  HENT:     Head: Normocephalic and atraumatic.  Eyes:     Conjunctiva/sclera: Conjunctivae normal.  Cardiovascular:     Rate and Rhythm: Normal rate and regular rhythm.     Heart sounds: Normal heart sounds.  Pulmonary:     Effort: Pulmonary effort is normal.     Breath sounds: Normal breath sounds.  Abdominal:     General: Bowel sounds are normal.     Palpations: Abdomen is soft.  Musculoskeletal:     Cervical back: Normal range of motion.  Lymphadenopathy:     Cervical: No cervical adenopathy.  Skin:  General: Skin is warm and dry.     Capillary Refill: Capillary refill takes less than 2 seconds.  Neurological:     Mental Status: She is alert and oriented to person, place, and time.  Psychiatric:        Behavior: Behavior normal.      Musculoskeletal Exam: C-spine has good range of motion.  Mild posterior thoracic kyphosis noted.  Shoulder joints, elbow joints, wrist joints, MCPs, PIPs, DIPs are good range of motion with no synovitis.  CMC, PIP, DIP thickening consistent with osteoarthritis of both hands.  Tenderness of the right CMC joint.  Synovial thickening of the right second MCP joint.  Complete fist formation bilaterally.  Hip joints have good range of motion with no groin pain.  Mild tenderness over the left trochanteric bursa.  Right knee replacement has good range of motion no warmth or effusion.  Left knee joint has no warmth or effusion.  Ankle joints have good range of motion with no tenderness or joint swelling.  CDAI Exam: CDAI Score: -- Patient Global: --; Provider Global: -- Swollen: --; Tender: -- Joint Exam 05/19/2024   No joint exam has been  documented for this visit   There is currently no information documented on the homunculus. Go to the Rheumatology activity and complete the homunculus joint exam.  Investigation: No additional findings.  Imaging: No results found.  Recent Labs: Lab Results  Component Value Date   WBC 6.7 12/18/2023   HGB 14.0 12/18/2023   PLT 249 12/18/2023   NA 141 12/18/2023   K 4.2 12/18/2023   CL 104 12/18/2023   CO2 30 12/18/2023   GLUCOSE 82 12/18/2023   BUN 17 12/18/2023   CREATININE 0.72 12/18/2023   BILITOT 0.6 12/18/2023   ALKPHOS 48 02/11/2017   AST 24 12/18/2023   ALT 16 12/18/2023   PROT 7.1 12/18/2023   PROT 7.1 12/18/2023   ALBUMIN 3.9 02/11/2017   CALCIUM  9.8 12/18/2023   GFRAA 92 01/18/2021    Speciality Comments: PLQ eye exam: 09/05/2023 WNL @ [facility] Follow up in 1 year   Procedures:  No procedures performed Allergies: Boniva [ibandronate], Chlorhexidine  gluconate, Latex, Sulfonamide derivatives, and Bacitracin-polymyxin b      Assessment / Plan:     Visit Diagnoses: Sjogren's syndrome with keratoconjunctivitis sicca (HCC) - ANA+, Ro+, sicca symptoms, Raynaud's, inflammatory arthritis: Patient remains on Plaquenil  200 mg 1 tablet by mouth daily.  She is tolerating Plaquenil  without any side effects and has not had any gaps in therapy.  Overall her symptoms have been manageable and she has not had any signs or symptoms of a flare.  No synovitis noted on examination today.   She has chronic mouth dryness.  She continues to see the dentist every 4 months and ophthalmology on a regular basis.  She has not had any parotid swelling or cervical lymphadenopathy.  No new or worsening pulmonary symptoms.  Discussed the increased risk for developing interstitial lung disease and lymphoma in patients with Sjogren's syndrome. Lab work from 12/18/23 was reviewed today in the office: ESR WNL, complements WNL, SPEP normal.  Plan to obtain the following lab work today  for further evaluation.  She will remain on Plaquenil  as prescribed.  She was advised to notify us  if she develops any new or worsening symptoms.  She will follow-up in the office in 5 months or sooner if needed.  High risk medication use - Plaquenil  200 mg 1 tablet by mouth daily. PLQ eye exam:  09/05/2023 WNL @ [facility] Follow up in 1 year.  CBC and CMP WNL on 12/18/23.  Orders for CBC and CMP released today.    Primary osteoarthritis of both hands: CMC, PIP, DIP thickening consistent with osteoarthritis of both hands.  Synovial thickening of the right second MCP joint noted.  Tenderness of the right CMC joint noted.  She will be having a right CMC joint cortisone injection performed in August 2025 by Dr. Alyse.  She is not planning to proceed with surgical invention at this time.  Trochanteric bursitis of left hip: Intermittent discomfort.  Mild tenderness on palpation today.  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 Hiram.  Doing well.  No warmth or effusion noted.  Primary osteoarthritis of left knee: She has good ROM of the left knee with no warmth or effusion.  She continues to undergo viscosupplementation for the left knee at [facility].  She is not planning to proceed with surgical intervention at this time.  Postural kyphosis of thoracic region  Degeneration of intervertebral disc of lumbar region without discogenic back pain or lower extremity pain: Not currently symptomatic.  No symptoms of radiculopathy.   Osteopenia of multiple sites - Previous DEXA on 05/03/21: The BMD RFN 0.759 g/cm2 with a T-score of -2.0.  DEXA updated on 07/23/23: BMD LFN 0.794 g/cm2 with a T-score of-1.8. Prolia 60 mg subcutaneous injections every 6 months--ordered by PCP--most recent dose administered in march 2025.   Vitamin D  deficiency: She is taking calcium  and vitamin D  supplement daily.  Closed compression fracture of body of L1 vertebra (HCC)  Other  fatigue: No increased fatigue.   Other medical conditions are listed as follows:   History of anxiety  History of depression  Hypothyroidism, adult  Vulvodynia: Improved.     Orders: Orders Placed This Encounter  Procedures   C3 and C4   Sedimentation rate   VITAMIN D  25 Hydroxy (Vit-D Deficiency, Fractures)   Comprehensive metabolic panel with GFR   Protein / creatinine ratio, urine   CBC with Differential/Platelet   Anti-DNA antibody, double-stranded   ANA   Meds ordered this encounter  Medications   hydroxychloroquine  (PLAQUENIL ) 200 MG tablet    Sig: Take 1 tablet (200 mg total) by mouth daily.    Dispense:  90 tablet    Refill:  0     Follow-Up Instructions: Return in about 5 months (around 10/19/2024) for Sjogren's syndrome, Osteoarthritis.   Christine CHRISTELLA Craze, PA-C  Note - This record has been created using Dragon software.  Chart creation errors have been sought, but may not always  have been located. Such creation errors do not reflect on  the standard of medical care.

## 2024-05-19 ENCOUNTER — Encounter: Payer: Self-pay | Admitting: Physician Assistant

## 2024-05-19 ENCOUNTER — Ambulatory Visit: Payer: Medicare Other | Attending: Physician Assistant | Admitting: Physician Assistant

## 2024-05-19 VITALS — BP 110/70 | HR 59 | Resp 16 | Ht 65.0 in | Wt 150.2 lb

## 2024-05-19 DIAGNOSIS — M19041 Primary osteoarthritis, right hand: Secondary | ICD-10-CM | POA: Diagnosis not present

## 2024-05-19 DIAGNOSIS — M7062 Trochanteric bursitis, left hip: Secondary | ICD-10-CM

## 2024-05-19 DIAGNOSIS — M19042 Primary osteoarthritis, left hand: Secondary | ICD-10-CM

## 2024-05-19 DIAGNOSIS — M4004 Postural kyphosis, thoracic region: Secondary | ICD-10-CM

## 2024-05-19 DIAGNOSIS — M3501 Sicca syndrome with keratoconjunctivitis: Secondary | ICD-10-CM | POA: Diagnosis not present

## 2024-05-19 DIAGNOSIS — M8589 Other specified disorders of bone density and structure, multiple sites: Secondary | ICD-10-CM

## 2024-05-19 DIAGNOSIS — Z79899 Other long term (current) drug therapy: Secondary | ICD-10-CM

## 2024-05-19 DIAGNOSIS — N94819 Vulvodynia, unspecified: Secondary | ICD-10-CM

## 2024-05-19 DIAGNOSIS — Z8659 Personal history of other mental and behavioral disorders: Secondary | ICD-10-CM

## 2024-05-19 DIAGNOSIS — S32010A Wedge compression fracture of first lumbar vertebra, initial encounter for closed fracture: Secondary | ICD-10-CM

## 2024-05-19 DIAGNOSIS — E039 Hypothyroidism, unspecified: Secondary | ICD-10-CM

## 2024-05-19 DIAGNOSIS — Z96651 Presence of right artificial knee joint: Secondary | ICD-10-CM

## 2024-05-19 DIAGNOSIS — R5383 Other fatigue: Secondary | ICD-10-CM

## 2024-05-19 DIAGNOSIS — M51369 Other intervertebral disc degeneration, lumbar region without mention of lumbar back pain or lower extremity pain: Secondary | ICD-10-CM

## 2024-05-19 DIAGNOSIS — M359 Systemic involvement of connective tissue, unspecified: Secondary | ICD-10-CM

## 2024-05-19 DIAGNOSIS — E559 Vitamin D deficiency, unspecified: Secondary | ICD-10-CM

## 2024-05-19 DIAGNOSIS — M1712 Unilateral primary osteoarthritis, left knee: Secondary | ICD-10-CM

## 2024-05-19 MED ORDER — HYDROXYCHLOROQUINE SULFATE 200 MG PO TABS: 200.0000 mg | ORAL_TABLET | Freq: Every day | ORAL | 0 refills | Status: DC

## 2024-05-22 LAB — CBC WITH DIFFERENTIAL/PLATELET
Absolute Lymphocytes: 935 {cells}/uL (ref 850–3900)
Absolute Monocytes: 468 {cells}/uL (ref 200–950)
Basophils Absolute: 72 {cells}/uL (ref 0–200)
Basophils Relative: 1.3 %
Eosinophils Absolute: 220 {cells}/uL (ref 15–500)
Eosinophils Relative: 4 %
HCT: 42.8 % (ref 35.0–45.0)
Hemoglobin: 13.9 g/dL (ref 11.7–15.5)
MCH: 32.9 pg (ref 27.0–33.0)
MCHC: 32.5 g/dL (ref 32.0–36.0)
MCV: 101.2 fL — ABNORMAL HIGH (ref 80.0–100.0)
MPV: 10.9 fL (ref 7.5–12.5)
Monocytes Relative: 8.5 %
Neutro Abs: 3806 {cells}/uL (ref 1500–7800)
Neutrophils Relative %: 69.2 %
Platelets: 208 Thousand/uL (ref 140–400)
RBC: 4.23 Million/uL (ref 3.80–5.10)
RDW: 12.3 % (ref 11.0–15.0)
Total Lymphocyte: 17 %
WBC: 5.5 Thousand/uL (ref 3.8–10.8)

## 2024-05-22 LAB — COMPREHENSIVE METABOLIC PANEL WITH GFR
AG Ratio: 1.8 (calc) (ref 1.0–2.5)
ALT: 14 U/L (ref 6–29)
AST: 23 U/L (ref 10–35)
Albumin: 4.5 g/dL (ref 3.6–5.1)
Alkaline phosphatase (APISO): 41 U/L (ref 37–153)
BUN: 15 mg/dL (ref 7–25)
CO2: 28 mmol/L (ref 20–32)
Calcium: 10.5 mg/dL — ABNORMAL HIGH (ref 8.6–10.4)
Chloride: 105 mmol/L (ref 98–110)
Creat: 0.74 mg/dL (ref 0.60–1.00)
Globulin: 2.5 g/dL (ref 1.9–3.7)
Glucose, Bld: 88 mg/dL (ref 65–99)
Potassium: 4.6 mmol/L (ref 3.5–5.3)
Sodium: 141 mmol/L (ref 135–146)
Total Bilirubin: 0.5 mg/dL (ref 0.2–1.2)
Total Protein: 7 g/dL (ref 6.1–8.1)
eGFR: 83 mL/min/1.73m2 (ref >=60)

## 2024-05-22 LAB — PROTEIN / CREATININE RATIO, URINE
Creatinine, Urine: 16 mg/dL — ABNORMAL LOW (ref 20–275)
Total Protein, Urine: <4 mg/dL — ABNORMAL LOW (ref 5–24)

## 2024-05-22 LAB — ANTI-NUCLEAR AB-TITER (ANA TITER)
ANA TITER: 1:80 {titer} — ABNORMAL HIGH
ANA Titer 1: 1:160 {titer} — ABNORMAL HIGH

## 2024-05-22 LAB — ANA: Anti Nuclear Antibody (ANA): POSITIVE — AB

## 2024-05-22 LAB — C3 AND C4
C3 Complement: 126 mg/dL (ref 83–193)
C4 Complement: 21 mg/dL (ref 15–57)

## 2024-05-22 LAB — ANTI-DNA ANTIBODY, DOUBLE-STRANDED: ds DNA Ab: 1 [IU]/mL

## 2024-05-22 LAB — SEDIMENTATION RATE: Sed Rate: 9 mm/h (ref 0–30)

## 2024-05-22 LAB — VITAMIN D 25 HYDROXY (VIT D DEFICIENCY, FRACTURES): Vit D, 25-Hydroxy: 53 ng/mL (ref 30–100)

## 2024-05-25 ENCOUNTER — Ambulatory Visit: Payer: Self-pay | Admitting: Physician Assistant

## 2024-05-25 NOTE — Progress Notes (Signed)
 Recommend reducing calcium  to 500 mg once daily.

## 2024-05-25 NOTE — Progress Notes (Signed)
 Calcium  is elevated-10.5. rest of CMP WNL.  Please clarify how much calcium  she is taking daily?    No proteinuria.   CBC stable ANA remains positive.   ESR WNL Vitamin D  WNL dsDNA is negative and complements WNL.  Labs are not consistent with a flare--no change in therapy recommended.

## 2024-06-04 ENCOUNTER — Other Ambulatory Visit: Payer: Self-pay | Admitting: Physician Assistant

## 2024-06-04 NOTE — Telephone Encounter (Signed)
 Last Fill: 03/09/2024  Next Visit: 10/20/2024  Last Visit: 05/19/2024  Dx: not mentioned  Current Dose per office note on 05/19/2024: not mentioned  Okay to refill Lexapro ?

## 2024-07-23 ENCOUNTER — Other Ambulatory Visit: Payer: Self-pay | Admitting: Physician Assistant

## 2024-07-23 DIAGNOSIS — Z79899 Other long term (current) drug therapy: Secondary | ICD-10-CM

## 2024-07-23 DIAGNOSIS — M3501 Sicca syndrome with keratoconjunctivitis: Secondary | ICD-10-CM

## 2024-07-23 DIAGNOSIS — M359 Systemic involvement of connective tissue, unspecified: Secondary | ICD-10-CM

## 2024-07-23 NOTE — Telephone Encounter (Signed)
 Last Fill: 05/19/2024  Eye exam:  09/05/2023 WNL    Labs: 05/19/2024 Calcium  is elevated-10.5. rest of CMP WNL. No proteinuria.   CBC stable ANA remains positive.   ESR WNL Vitamin D  WNL dsDNA is negative and complements WNL.  Next Visit: 10/20/2024  Last Visit: 05/19/2024  IK:Dgnhmzw'd syndrome with keratoconjunctivitis sicca   Current Dose per office note 05/19/2024: Plaquenil  200 mg 1 tablet by mouth daily.   Okay to refill Plaquenil ?

## 2024-08-04 ENCOUNTER — Other Ambulatory Visit: Payer: Self-pay | Admitting: Family Medicine

## 2024-08-04 DIAGNOSIS — Z1231 Encounter for screening mammogram for malignant neoplasm of breast: Secondary | ICD-10-CM

## 2024-08-13 ENCOUNTER — Other Ambulatory Visit: Payer: Self-pay | Admitting: Physician Assistant

## 2024-08-13 NOTE — Telephone Encounter (Signed)
 Last Fill: 06/04/2024  Next Visit: 10/20/2024  Last Visit: 05/19/2024  Dx: not mentioned   Current Dose per office note on 05/19/2024: not mentioned   Okay to refill Lexapro ?

## 2024-08-27 NOTE — Telephone Encounter (Signed)
Please update medication list. Thank you

## 2024-08-28 NOTE — Telephone Encounter (Signed)
 All medications requested are marked for removal.

## 2024-09-03 ENCOUNTER — Ambulatory Visit
Admission: RE | Admit: 2024-09-03 | Discharge: 2024-09-03 | Disposition: A | Discharge status: Home / Self Care | Source: Ambulatory Visit

## 2024-09-03 DIAGNOSIS — Z1231 Encounter for screening mammogram for malignant neoplasm of breast: Secondary | ICD-10-CM

## 2024-09-07 LAB — OPHTHALMOLOGY REPORT-SCANNED

## 2024-09-21 NOTE — Telephone Encounter (Signed)
 Ok to update lab work.  She may want to see PCP or ENT to rule out any other cause in the meantime.

## 2024-09-22 ENCOUNTER — Other Ambulatory Visit: Payer: Self-pay

## 2024-09-22 DIAGNOSIS — M359 Systemic involvement of connective tissue, unspecified: Secondary | ICD-10-CM

## 2024-09-22 DIAGNOSIS — Z79899 Other long term (current) drug therapy: Secondary | ICD-10-CM

## 2024-09-23 ENCOUNTER — Ambulatory Visit: Payer: Self-pay | Admitting: Physician Assistant

## 2024-09-23 NOTE — Progress Notes (Signed)
 No proteinuria  CBC WNL ESR WNL Complements WNL CMP WNL

## 2024-09-23 NOTE — Progress Notes (Signed)
 dsDNA negative.

## 2024-09-24 LAB — COMPLETE METABOLIC PANEL WITHOUT GFR
AG Ratio: 1.7 (calc) (ref 1.0–2.5)
ALT: 14 U/L (ref 6–29)
AST: 20 U/L (ref 10–35)
Albumin: 4.3 g/dL (ref 3.6–5.1)
Alkaline phosphatase (APISO): 37 U/L (ref 37–153)
BUN: 12 mg/dL (ref 7–25)
CO2: 30 mmol/L (ref 20–32)
Calcium: 9.4 mg/dL (ref 8.6–10.4)
Chloride: 105 mmol/L (ref 98–110)
Creat: 0.71 mg/dL (ref 0.60–1.00)
Globulin: 2.5 g/dL (ref 1.9–3.7)
Glucose, Bld: 61 mg/dL — ABNORMAL LOW (ref 65–99)
Potassium: 4 mmol/L (ref 3.5–5.3)
Sodium: 142 mmol/L (ref 135–146)
Total Bilirubin: 0.4 mg/dL (ref 0.2–1.2)
Total Protein: 6.8 g/dL (ref 6.1–8.1)

## 2024-09-24 LAB — CBC WITH DIFFERENTIAL/PLATELET
Absolute Lymphocytes: 985 {cells}/uL (ref 850–3900)
Absolute Monocytes: 549 {cells}/uL (ref 200–950)
Basophils Absolute: 47 {cells}/uL (ref 0–200)
Basophils Relative: 0.8 %
Eosinophils Absolute: 218 {cells}/uL (ref 15–500)
Eosinophils Relative: 3.7 %
HCT: 43.2 % (ref 35.9–46.0)
Hemoglobin: 14.5 g/dL (ref 11.7–15.5)
MCH: 32.8 pg (ref 27.0–33.0)
MCHC: 33.6 g/dL (ref 31.6–35.4)
MCV: 97.7 fL (ref 81.4–101.7)
MPV: 10.6 fL (ref 7.5–12.5)
Monocytes Relative: 9.3 %
Neutro Abs: 4101 {cells}/uL (ref 1500–7800)
Neutrophils Relative %: 69.5 %
Platelets: 228 Thousand/uL (ref 140–400)
RBC: 4.42 Million/uL (ref 3.80–5.10)
RDW: 11.8 % (ref 11.0–15.0)
Total Lymphocyte: 16.7 %
WBC: 5.9 Thousand/uL (ref 3.8–10.8)

## 2024-09-24 LAB — ANTI-NUCLEAR AB-TITER (ANA TITER): ANA Titer 1: 1:80 {titer} — ABNORMAL HIGH

## 2024-09-24 LAB — C3 AND C4
C3 Complement: 119 mg/dL (ref 83–193)
C4 Complement: 23 mg/dL (ref 15–57)

## 2024-09-24 LAB — SEDIMENTATION RATE: Sed Rate: 9 mm/h (ref 0–30)

## 2024-09-24 LAB — ANA: Anti Nuclear Antibody (ANA): POSITIVE — AB

## 2024-09-24 LAB — PROTEIN / CREATININE RATIO, URINE
Creatinine, Urine: 26 mg/dL (ref 20–275)
Total Protein, Urine: <4 mg/dL — ABNORMAL LOW (ref 5–24)

## 2024-09-24 LAB — ANTI-DNA ANTIBODY, DOUBLE-STRANDED: ds DNA Ab: <1 [IU]/mL

## 2024-09-25 NOTE — Progress Notes (Signed)
 Urine protein creatinine ratio normal, CMP normal, ANA low titer positive and stable, double-stranded ENA negative, complements normal, CBC normal, sed rate normal.  Labs do not indicate an autoimmune disease flare.

## 2024-10-06 NOTE — Progress Notes (Unsigned)
 "  Office Visit Note  Patient: [name]             Date of Birth: 11-Mar-1946           MRN: [id number]             PCP: Aisha Harvey, MD Referring: Aisha Harvey, MD Visit Date: 10/20/2024 Occupation: Data Unavailable  Subjective:  Mouth dryness   History of Present Illness: [name] is a 78 y.o. female with history of autoimmune disease, sjogren's syndrome, and osteoarthritis.  Patient remains on  Plaquenil  200 mg 1 tablet by mouth daily.  She is tolerating Plaquenil  without any side effects and has not had any gaps in therapy.  Patient reports that since October she has been experiencing a burning sensation in her mouth with no identifiable trigger.  Patient states that the symptoms are most severe in the mid afternoon.  Patient has been using Biotene products twice daily and tries to remain hydrated throughout the day.  She has not had any signs or symptoms of thrush.  Patient has been taking a probiotic and has been under the close care of her dentist due to requiring a crown.  She continues to have chronic eye dryness and has been using artificial tears and gel lubricating drops at bedtime.  She denies any swollen lymph nodes or parotid swelling.  She denies any increased joint swelling.  Patient has been under the care of Dr. Alyse for management of pain affecting both CMC joints.  She has been having CMC joint cortisone injections every 3 months which have been managing her symptoms.  She denies any recent rashes. She is using artifical tears for dry eyes.        Activities of Daily Living:  Patient denies any morning stiffness  Patient Denies nocturnal pain.  Difficulty dressing/grooming: Denies Difficulty climbing stairs: Denies Difficulty getting out of chair: Denies Difficulty using hands for taps, buttons, cutlery, and/or writing: Denies  Review of Systems  Constitutional:  Negative for fatigue.  HENT:  Positive for mouth dryness. Negative for mouth sores.    Eyes:  Positive for dryness.  Respiratory:  Negative for shortness of breath.   Cardiovascular:  Negative for chest pain and palpitations.  Gastrointestinal:  Positive for diarrhea. Negative for blood in stool and constipation.  Endocrine: Negative for increased urination.  Genitourinary:  Negative for involuntary urination.  Musculoskeletal:  Positive for joint pain, gait problem, joint pain and muscle weakness. Negative for joint swelling, myalgias, morning stiffness, muscle tenderness and myalgias.  Skin:  Positive for sensitivity to sunlight. Negative for color change, rash and hair loss.  Allergic/Immunologic: Negative for susceptible to infections.  Neurological:  Negative for dizziness and headaches.  Hematological:  Negative for swollen glands.  Psychiatric/Behavioral:  Negative for depressed mood and sleep disturbance. The patient is not nervous/anxious.     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% 07/20/2009    Past Medical History:  Diagnosis Date  Allergy    Anxiety    Atherosclerosis    Cancer (HCC)    Basal cell skin cancer   Colon polyp    Compression fracture of L1 lumbar vertebra (HCC)    dx by ortho   Environmental allergies    Hypothyroid    Osteoarthritis    Osteoarthritis    Osteoarthritis    Osteopenia    Plantar fasciitis, right    Raynauds phenomenon    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    Sjogren's syndrome    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   Vulvodynia     Family History  Problem Relation Age of Onset   Heart disease Mother    Psoriasis Father    Arthritis Father    Heart disease Brother    Heart attack Brother    Diabetes Maternal Grandfather    Cancer Maternal Uncle        colon   Colon cancer Maternal Uncle 20   Asthma Sister    Breast cancer Neg Hx    Past Surgical History:  Procedure Laterality Date   APPENDECTOMY     BUNIONECTOMY  2008   right foot   CATARACT EXTRACTION     COLONOSCOPY  12/05/2016   Dr.Jacobs   COLONOSCOPY W/ POLYPECTOMY  08/27/2011   ENDOMETRIAL ABLATION     FOOT SURGERY Right 12/17/2017   bone spur   ingrown toenail Right 12/16/2018   LAPAROSCOPIC APPENDECTOMY  10/11/2011   Procedure: APPENDECTOMY LAPAROSCOPIC;  Surgeon: Elspeth KYM Schultze, MD;  Location: [facility];  Service: General;  Laterality: N/A;  Partial Cecectomy and Appendectomy   OTHER SURGICAL HISTORY Left 04/13/2024   YAG LASER CAPSULATOMY   POLYPECTOMY     TOTAL KNEE ARTHROPLASTY Right 04/08/2023   Procedure: TOTAL KNEE ARTHROPLASTY;  Surgeon: Melodi Lerner, MD;  Location: [facility];  Service: Orthopedics;  Laterality: Right;   WISDOM TOOTH EXTRACTION     Social History[1] Social History   Social History Narrative   Not on file     Immunization History  Administered Date(s) Administered   DTaP 06/16/2021   INFLUENZA, HIGH DOSE SEASONAL PF 07/05/2018, 06/18/2019, 07/11/2022   Influenza,inj,quad, With Preservative 07/10/2017   Influenza-Unspecified 07/23/2020, 06/26/2021, 07/12/2021, 06/10/2023   PFIZER(Purple Top)SARS-COV-2 Vaccination 11/16/2019, 12/07/2019, 06/14/2020, 05/16/2021   Pfizer Covid-19 Vaccine Bivalent Booster 52yrs & up 07/27/2022   Pneumococcal Conjugate-13 09/08/2014   Pneumococcal Polysaccharide-23 08/30/2011   RSV,unspecified 10/31/2022   Tdap 06/20/2011, 06/16/2021   Zoster Recombinant(Shingrix) 02/05/2017, 05/08/2017   Zoster, Live 10/11/2007      Objective: Vital Signs: BP 136/84   Pulse 65   Temp (!) 97.2 F (36.2 C)   Resp 16   Ht 5' 5 (1.651 m)   Wt 155 lb 9.6 oz (70.6 kg)   BMI 25.89 kg/m    Physical Exam Vitals and nursing note reviewed.  Constitutional:      Appearance: She is well-developed.  HENT:     Head: Normocephalic and atraumatic.  Eyes:     Conjunctiva/sclera: Conjunctivae normal.  Cardiovascular:     Rate and Rhythm: Normal rate and regular rhythm.     Heart sounds: Normal heart sounds.  Pulmonary:     Effort: Pulmonary effort is normal.     Breath sounds: Normal breath sounds.  Abdominal:     General: Bowel sounds are normal.     Palpations: Abdomen is soft.  Musculoskeletal:     Cervical back: Normal range of motion.  Lymphadenopathy:     Cervical: No cervical adenopathy.  Skin:    General: Skin is warm and dry.     Capillary Refill: Capillary refill takes less than 2 seconds.  Neurological:     Mental Status: She is alert and oriented to person, place, and time.  Psychiatric:        Behavior: Behavior normal.      Musculoskeletal Exam: C-spine, thoracic spine, lumbar spine have good range of motion.  No midline spinal tenderness.  No SI joint tenderness.  Shoulder joints, elbow joints, wrist joints, MCPs, PIPs, DIPs have good range of motion with no synovitis.  Complete fist formation bilaterally.  Hip joints have good range of motion with no groin pain.  Knee joints have good range of motion no warmth or effusion.  Ankle joints have good range of motion no tenderness or joint swelling.  No evidence of Achilles tendinitis or plantar fasciitis.   CDAI Exam: CDAI Score: -- Patient Global: --; Provider Global: -- Swollen: --; Tender: -- Joint Exam 10/20/2024   No joint exam has been documented for this visit   There is currently no information documented on the homunculus. Go to the Rheumatology activity and complete the homunculus joint exam.  Investigation: No additional  findings.  Imaging: No results found.  Recent Labs: Lab Results  Component Value Date   WBC 5.9 09/22/2024   HGB 14.5 09/22/2024   PLT 228 09/22/2024   NA 142 09/22/2024   K 4.0 09/22/2024   CL 105 09/22/2024   CO2 30 09/22/2024   GLUCOSE 61 (L) 09/22/2024   BUN 12 09/22/2024   CREATININE 0.71 09/22/2024   BILITOT 0.4 09/22/2024   ALKPHOS 48 02/11/2017   AST 20 09/22/2024   ALT 14 09/22/2024   PROT 6.8 09/22/2024   ALBUMIN 3.9 02/11/2017   CALCIUM  9.4 09/22/2024   GFRAA 92 01/18/2021    Speciality Comments: PLQ eye exam: 09/05/2023 WNL @ [facility] Follow up in 1 year   Procedures:  No procedures performed Allergies: Boniva [ibandronate], Chlorhexidine  gluconate, Latex, Sulfonamide derivatives, and Bacitracin-polymyxin b    Assessment / Plan:     Visit Diagnoses: Autoimmune disease: ANA+, Ro+, sicca symptoms, Raynaud's, inflammatory arthritis: She has not had any signs or symptoms of a flare.  She is clinically doing well taking Plaquenil  200 mg 1 tablet by mouth daily.  She is tolerating Plaquenil  without any side effects and has not had any gaps in therapy.  She has no synovitis on examination today.  She has not had any recent rashes.  No oral or nasal ulcerations.  She has intermittent symptoms of Raynaud's phenomenon but no signs of sclerodactyly were noted.  No digital ulcerations were noted.  She continues to have chronic sicca symptoms and has been using artificial tears and gel lubricating drops for dry eyes.  She is also been using Biotene products twice daily and drinking water  throughout the day for symptoms of dry mouth.  She has been experiencing an oral burning sensation since October 2025 especially in the mid afternoons.  On examination she has no parotid swelling or cervical lymphadenopathy.  Her oral mucosa appears very dry.  Discussed the option of initiating pilocarpine 5 mg 1 tablet 3 times daily as needed for sicca symptom relief.  Also  discussed the option of trying basic bites if approved by her dentist.  Patient plans on first trying basic bites and will hold off on initiating pilocarpine.  She remain on Plaquenil  as prescribed. Lab work from 09/22/24 was reviewed today in the office:  ESR WNL, complements WNL, no proteincuria, ANA remains positive, and dsDNA is negative.   She was advised to notify us  if she develops any signs or symptoms of a flare.  She will follow-up in the office in 5 months or sooner if needed.  High risk medication use - Plaquenil  200 mg 1 tablet by mouth daily. CBC and CMP updated on 09/22/24.   PLQ eye exam: 09/05/2023 WNL @ [facility] Follow up in 1 year.  Plan to call to obtain Plaquenil  eye examination records.  Sjogren's syndrome with keratoconjunctivitis sicca - ANA+, Ro+, sicca symptoms, Raynaud's, inflammatory arthritis: Patient continues to have chronic eye dryness and has been using artificial tears during the day and gel lubricating drops at bedtime.  Patient reports since October she has been experiencing an oral burning sensation especially in the mid afternoons.  She has not had any identifiable triggers.  No dietary changes.  No signs or symptoms of thrush.  Oral mucosa appear dry during examination today.  No parotid swelling or cervical lymphadenopathy was noted. Discussed the option of trying basic lites which are available over-the-counter.  Patient was encouraged to clarify with the dentist if she can take these.   Discussed the option of adding on pilocarpine if her symptoms persist or worsen.  Reviewed indications, contraindications, potential side effects of pilocarpine today in detail.  For now she would like to remain on Plaquenil  as prescribed.  She was advised to notify us  if she develops any new or worsening symptoms.  Primary osteoarthritis of both hands: CMC, PIP, DIP thickening consistent with osteoarthritis of both hands.  Under the care of Dr. Alyse.  Patient has  been receiving CMC joint injections every 3 months which have been managing her symptoms.  She does not plan to proceed with surgical intervention at this time  Trochanteric bursitis of left hip: Not currently symptomatic.  No tenderness upon palpation.   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 Hiram.  Doing well.  Mild warmth but no effusion.   Primary osteoarthritis of left knee: No warmth or effusion.  Postural kyphosis of thoracic region  Degeneration of intervertebral disc of lumbar region without discogenic back pain or lower extremity pain: No symptoms of radiculopathy.  Osteopenia of multiple sites - DEXA on 05/03/21: The BMD RFN 0.759 g/cm2 with a T-score of -2.0.  DEXA 07/23/23: BMD LFN 0.794 g/cm2 with a T-score of-1.8. She remains on Prolia 60 mg sq 6 months--ordered by PCP. She is taking calcium  and vitamin D  supplement daily.  Vitamin D  deficiency: She is taking calcium  and vitamin D  supplement daily.  Closed compression fracture of body of L1 vertebra (HCC): She remains on Prolia 60 mg sq injections once every 6 months.  Other medical conditions are listed as follows:  Other fatigue: She is not experiencing any increased fatigue at this time.  History of anxiety: She is taking Lexapro  as prescribed.  History of depression: She is taking Lexapro  as prescribed.  Hypothyroidism, adult  Orders: No orders of the defined types were placed in this encounter.  No orders of the defined types were placed in this encounter.    Follow-Up Instructions: Return in about 5 months (around 03/20/2025) for Autoimmune Disease, Sjogren's syndrome.   Waddell CHRISTELLA Craze, PA-C  Note - This record has been created using Dragon software.  Chart creation errors have been sought, but may not always  have been located. Such creation errors do not reflect on  the standard of  medical care.     [1]  Social History Tobacco Use   Smoking status: Never    Passive  exposure: Past   Smokeless tobacco: Never  Vaping Use   Vaping status: Never Used  Substance Use Topics   Alcohol  use: No   Drug use: No   "

## 2024-10-20 ENCOUNTER — Ambulatory Visit: Attending: Physician Assistant | Admitting: Physician Assistant

## 2024-10-20 ENCOUNTER — Encounter: Payer: Self-pay | Admitting: Physician Assistant

## 2024-10-20 VITALS — BP 136/84 | HR 65 | Temp 97.2°F | Resp 16 | Ht 65.0 in | Wt 155.6 lb

## 2024-10-20 DIAGNOSIS — E039 Hypothyroidism, unspecified: Secondary | ICD-10-CM

## 2024-10-20 DIAGNOSIS — M19041 Primary osteoarthritis, right hand: Secondary | ICD-10-CM

## 2024-10-20 DIAGNOSIS — M7062 Trochanteric bursitis, left hip: Secondary | ICD-10-CM | POA: Diagnosis not present

## 2024-10-20 DIAGNOSIS — E559 Vitamin D deficiency, unspecified: Secondary | ICD-10-CM

## 2024-10-20 DIAGNOSIS — M359 Systemic involvement of connective tissue, unspecified: Secondary | ICD-10-CM

## 2024-10-20 DIAGNOSIS — M51369 Other intervertebral disc degeneration, lumbar region without mention of lumbar back pain or lower extremity pain: Secondary | ICD-10-CM

## 2024-10-20 DIAGNOSIS — M4004 Postural kyphosis, thoracic region: Secondary | ICD-10-CM

## 2024-10-20 DIAGNOSIS — M1712 Unilateral primary osteoarthritis, left knee: Secondary | ICD-10-CM

## 2024-10-20 DIAGNOSIS — Z96651 Presence of right artificial knee joint: Secondary | ICD-10-CM

## 2024-10-20 DIAGNOSIS — M8589 Other specified disorders of bone density and structure, multiple sites: Secondary | ICD-10-CM

## 2024-10-20 DIAGNOSIS — Z79899 Other long term (current) drug therapy: Secondary | ICD-10-CM | POA: Diagnosis not present

## 2024-10-20 DIAGNOSIS — M3501 Sicca syndrome with keratoconjunctivitis: Secondary | ICD-10-CM

## 2024-10-20 DIAGNOSIS — Z8659 Personal history of other mental and behavioral disorders: Secondary | ICD-10-CM

## 2024-10-20 DIAGNOSIS — R5383 Other fatigue: Secondary | ICD-10-CM

## 2024-10-20 DIAGNOSIS — S32010A Wedge compression fracture of first lumbar vertebra, initial encounter for closed fracture: Secondary | ICD-10-CM | POA: Diagnosis not present

## 2024-10-20 DIAGNOSIS — M19042 Primary osteoarthritis, left hand: Secondary | ICD-10-CM

## 2024-10-20 NOTE — Patient Instructions (Signed)
Pilocarpine Tablets What is this medication? PILOCARPINE (PYE loe KAR peen) treats dry mouth. It works by increasing the amount of saliva in the mouth, which makes it easier to speak and swallow. This medicine may be used for other purposes; ask your health care provider or pharmacist if you have questions. COMMON BRAND NAME(S): Salagen What should I tell my care team before I take this medication? They need to know if you have any of these conditions: Eye infection or other eye problems Glaucoma Heart disease Liver disease Lung or breathing disease, such as asthma An unusual or allergic reaction to pilocarpine, other medications, foods, dyes, or preservatives Pregnant or trying to get pregnant Breast-feeding How should I use this medication? Take this medication by mouth with a full glass of water. Take it as directed on the prescription label at the same time every day. Keep taking it unless your care team tells you to stop. Talk to your care team about the use of this medication in children. Special care may be needed. Overdosage: If you think you have taken too much of this medicine contact a poison control center or emergency room at once. NOTE: This medicine is only for you. Do not share this medicine with others. What if I miss a dose? If you miss a dose, take it as soon as you can. If it is almost time for your next dose, take only that dose. Do not take double or extra doses. What may interact with this medication? Antihistamines for allergy, cough, and cold Atropine Certain medications for Alzheimer disease, such as donepezil, galantamine, rivastigmine Certain medications for bladder problems, such as bethanechol, oxybutynin, tolterodine Certain medications for Parkinson disease, such as benztropine, trihexyphenidyl Certain medications for quitting smoking, such as nicotine Certain medications for stomach problems, such as dicyclomine, hyoscyamine Certain medications for  travel sickness, such as scopolamine Ipratropium Medications for blood pressure or heart problems, such as metoprolol This list may not describe all possible interactions. Give your health care provider a list of all the medicines, herbs, non-prescription drugs, or dietary supplements you use. Also tell them if you smoke, drink alcohol, or use illegal drugs. Some items may interact with your medicine. What should I watch for while using this medication? Visit your care team for regular checks on your progress. Tell your care team if your symptoms do not get better or if they get worse. You may get blurry vision or have trouble telling how far something is from you. This may be a problem at night or when the lights are low. Do not drive, use machinery, or do anything that needs clear vision until you know how this medication affects you. If you sweat a lot, drink enough to replace fluids. Do not get dehydrated. What side effects may I notice from receiving this medication? Side effects that you should report to your care team as soon as possible: Allergic reactions--skin rash, itching, hives, swelling of the face, lips, tongue, or throat Fast or irregular heartbeat Increase in blood pressure Low blood pressure--dizziness, feeling faint or lightheaded, blurry vision Slow heartbeat--dizziness, feeling faint or lightheaded, confusion, trouble breathing, unusual weakness or fatigue Side effects that usually do not require medical attention (report to your care team if they continue or are bothersome): Change in vision Chills Diarrhea Excessive sweating Flushing Headache Increased need to urinate This list may not describe all possible side effects. Call your doctor for medical advice about side effects. You may report side effects to FDA at 1-800-FDA-1088.   Where should I keep my medication? Keep out of the reach of children and pets. Store at room temperature between 15 and 30 degrees C (59 and  86 degrees F). Get rid of any unused medication after the expiration date. To get rid of medications that are no longer needed or have expired: Take the medications to a medication take-back program. Check with your pharmacy or law enforcement to find a location. If your cannot return the medication, check the label or package insert to see if the medication should be thrown out in the garbage or flushed down the toilet. If you are not sure, ask your care team. If it is safe to put it in the trash, take the medication out of the container. Mix the medication with cat litter, dirt, coffee grounds, or other unwanted substance. Seal the mixture in a bag or container. Put it in the trash. NOTE: This sheet is a summary. It may not cover all possible information. If you have questions about this medicine, talk to your doctor, pharmacist, or health care provider.  2024 Elsevier/Gold Standard (2021-09-22 00:00:00)  

## 2024-10-29 ENCOUNTER — Other Ambulatory Visit: Payer: Self-pay | Admitting: Physician Assistant

## 2024-10-29 NOTE — Telephone Encounter (Signed)
 Last Fill: 08/13/2024  Next Visit: 03/26/2025  Last Visit: 10/20/2024  DX: History of anxiety, History of depression  Current Dose per office note on 10/20/2024: She is taking Lexapro  as prescribed.   Okay to refill lexapro ?

## 2024-10-29 NOTE — Telephone Encounter (Signed)
 Last Fill: 08/13/2024  Next Visit: 03/26/2025  Last Visit: 10/20/2024  Dx: History of anxiety   Current Dose per office note on 10/20/2024: She is taking Lexapro  as prescribed.   Okay to refill Lexapro ?

## 2024-11-09 ENCOUNTER — Other Ambulatory Visit: Payer: Self-pay | Admitting: Physician Assistant

## 2024-11-09 DIAGNOSIS — M3501 Sicca syndrome with keratoconjunctivitis: Secondary | ICD-10-CM

## 2024-11-09 DIAGNOSIS — M359 Systemic involvement of connective tissue, unspecified: Secondary | ICD-10-CM

## 2024-11-09 DIAGNOSIS — Z79899 Other long term (current) drug therapy: Secondary | ICD-10-CM

## 2024-11-09 NOTE — Telephone Encounter (Signed)
 Last Fill: 07/23/2024  Eye exam: 09/07/2024 WNL   Labs: 09/22/2024 No proteinuria  CBC WNL ESR WNL Complements WNL CMP WNL  Next Visit: 03/26/2025  Last Visit: 10/20/2024  IK:Jlunpfflwz disease   Current Dose per office note 10/20/2024: Plaquenil  200 mg 1 tablet by mouth daily.   Okay to refill Plaquenil ?

## 2025-03-26 ENCOUNTER — Ambulatory Visit: Admitting: Rheumatology
